# Patient Record
Sex: Female | Born: 1935 | Race: White | Hispanic: No | Smoking: Former smoker
Health system: Southern US, Community
[De-identification: ages and names within clinical notes are randomized; demographics above are authoritative.]

## PROBLEM LIST (undated history)

## (undated) DIAGNOSIS — G709 Myoneural disorder, unspecified: Secondary | ICD-10-CM

## (undated) DIAGNOSIS — I5031 Acute diastolic (congestive) heart failure: Secondary | ICD-10-CM

## (undated) DIAGNOSIS — J302 Other seasonal allergic rhinitis: Secondary | ICD-10-CM

## (undated) DIAGNOSIS — M1990 Unspecified osteoarthritis, unspecified site: Secondary | ICD-10-CM

## (undated) DIAGNOSIS — M4800 Spinal stenosis, site unspecified: Secondary | ICD-10-CM

## (undated) DIAGNOSIS — I509 Heart failure, unspecified: Secondary | ICD-10-CM

## (undated) DIAGNOSIS — Z8739 Personal history of other diseases of the musculoskeletal system and connective tissue: Secondary | ICD-10-CM

## (undated) DIAGNOSIS — E7800 Pure hypercholesterolemia, unspecified: Secondary | ICD-10-CM

## (undated) HISTORY — DX: Pure hypercholesterolemia, unspecified: E78.00

## (undated) HISTORY — PX: CARPAL TUNNEL RELEASE: SHX101

## (undated) HISTORY — PX: TUBAL LIGATION: SHX77

## (undated) HISTORY — PX: APPENDECTOMY: SHX54

## (undated) HISTORY — DX: Heart failure, unspecified: I50.9

## (undated) HISTORY — DX: Personal history of other diseases of the musculoskeletal system and connective tissue: Z87.39

## (undated) HISTORY — DX: Acute diastolic (congestive) heart failure: I50.31

## (undated) HISTORY — DX: Unspecified osteoarthritis, unspecified site: M19.90

## (undated) HISTORY — DX: Spinal stenosis, site unspecified: M48.00

## (undated) HISTORY — PX: TONSILLECTOMY: SUR1361

## (undated) HISTORY — PX: GANGLION CYST EXCISION: SHX1691

---

## 1974-06-03 HISTORY — PX: OTHER SURGICAL HISTORY: SHX169

## 1998-06-03 HISTORY — PX: OTHER SURGICAL HISTORY: SHX169

## 2005-01-30 ENCOUNTER — Ambulatory Visit: Payer: Self-pay | Admitting: Internal Medicine

## 2005-02-05 ENCOUNTER — Ambulatory Visit: Payer: Self-pay | Admitting: Internal Medicine

## 2005-08-16 ENCOUNTER — Ambulatory Visit: Payer: Self-pay | Admitting: Internal Medicine

## 2006-02-06 ENCOUNTER — Ambulatory Visit: Payer: Self-pay | Admitting: Internal Medicine

## 2007-03-03 ENCOUNTER — Ambulatory Visit: Payer: Self-pay | Admitting: Internal Medicine

## 2008-03-10 ENCOUNTER — Ambulatory Visit: Payer: Self-pay | Admitting: Internal Medicine

## 2009-03-17 ENCOUNTER — Ambulatory Visit: Payer: Self-pay | Admitting: Internal Medicine

## 2010-03-19 ENCOUNTER — Ambulatory Visit: Payer: Self-pay | Admitting: Internal Medicine

## 2011-03-25 ENCOUNTER — Ambulatory Visit: Payer: Self-pay | Admitting: Internal Medicine

## 2011-03-28 LAB — LIPID PANEL
Cholesterol: 214 mg/dL — AB (ref 0–200)
Triglycerides: 113 mg/dL (ref 40–160)

## 2011-03-28 LAB — CBC AND DIFFERENTIAL
HCT: 43 % (ref 36–46)
Hemoglobin: 14.2 g/dL (ref 12.0–16.0)

## 2011-03-28 LAB — HEPATIC FUNCTION PANEL: AST: 18 U/L (ref 13–35)

## 2011-04-08 ENCOUNTER — Ambulatory Visit: Payer: Self-pay | Admitting: Physical Medicine and Rehabilitation

## 2011-06-06 DIAGNOSIS — M47817 Spondylosis without myelopathy or radiculopathy, lumbosacral region: Secondary | ICD-10-CM | POA: Diagnosis not present

## 2011-06-06 DIAGNOSIS — IMO0002 Reserved for concepts with insufficient information to code with codable children: Secondary | ICD-10-CM | POA: Diagnosis not present

## 2011-06-06 DIAGNOSIS — M48062 Spinal stenosis, lumbar region with neurogenic claudication: Secondary | ICD-10-CM | POA: Diagnosis not present

## 2011-10-01 DIAGNOSIS — E7800 Pure hypercholesterolemia, unspecified: Secondary | ICD-10-CM | POA: Diagnosis not present

## 2011-10-01 DIAGNOSIS — M549 Dorsalgia, unspecified: Secondary | ICD-10-CM | POA: Diagnosis not present

## 2011-11-06 DIAGNOSIS — L905 Scar conditions and fibrosis of skin: Secondary | ICD-10-CM | POA: Diagnosis not present

## 2011-11-06 DIAGNOSIS — B079 Viral wart, unspecified: Secondary | ICD-10-CM | POA: Diagnosis not present

## 2011-11-06 DIAGNOSIS — L821 Other seborrheic keratosis: Secondary | ICD-10-CM | POA: Diagnosis not present

## 2011-11-06 DIAGNOSIS — Z0189 Encounter for other specified special examinations: Secondary | ICD-10-CM | POA: Diagnosis not present

## 2012-01-01 DIAGNOSIS — M79609 Pain in unspecified limb: Secondary | ICD-10-CM | POA: Diagnosis not present

## 2012-01-01 DIAGNOSIS — IMO0002 Reserved for concepts with insufficient information to code with codable children: Secondary | ICD-10-CM | POA: Diagnosis not present

## 2012-01-14 DIAGNOSIS — IMO0002 Reserved for concepts with insufficient information to code with codable children: Secondary | ICD-10-CM | POA: Diagnosis not present

## 2012-01-29 DIAGNOSIS — IMO0002 Reserved for concepts with insufficient information to code with codable children: Secondary | ICD-10-CM | POA: Diagnosis not present

## 2012-02-19 DIAGNOSIS — IMO0002 Reserved for concepts with insufficient information to code with codable children: Secondary | ICD-10-CM | POA: Diagnosis not present

## 2012-03-04 DIAGNOSIS — Z23 Encounter for immunization: Secondary | ICD-10-CM | POA: Diagnosis not present

## 2012-03-25 ENCOUNTER — Ambulatory Visit: Payer: Self-pay | Admitting: Internal Medicine

## 2012-03-25 DIAGNOSIS — Z1231 Encounter for screening mammogram for malignant neoplasm of breast: Secondary | ICD-10-CM | POA: Diagnosis not present

## 2012-03-25 DIAGNOSIS — R928 Other abnormal and inconclusive findings on diagnostic imaging of breast: Secondary | ICD-10-CM | POA: Diagnosis not present

## 2012-03-27 DIAGNOSIS — H2510 Age-related nuclear cataract, unspecified eye: Secondary | ICD-10-CM | POA: Diagnosis not present

## 2012-03-30 DIAGNOSIS — M129 Arthropathy, unspecified: Secondary | ICD-10-CM | POA: Diagnosis not present

## 2012-04-01 DIAGNOSIS — IMO0002 Reserved for concepts with insufficient information to code with codable children: Secondary | ICD-10-CM | POA: Diagnosis not present

## 2012-06-25 ENCOUNTER — Encounter: Payer: Self-pay | Admitting: *Deleted

## 2012-06-26 ENCOUNTER — Other Ambulatory Visit (HOSPITAL_COMMUNITY)
Admission: RE | Admit: 2012-06-26 | Discharge: 2012-06-26 | Disposition: A | Discharge status: Home / Self Care | Payer: Medicare Other | Source: Ambulatory Visit | Attending: Internal Medicine | Admitting: Internal Medicine

## 2012-06-26 ENCOUNTER — Encounter: Payer: Self-pay | Admitting: Internal Medicine

## 2012-06-26 ENCOUNTER — Ambulatory Visit (INDEPENDENT_AMBULATORY_CARE_PROVIDER_SITE_OTHER): Payer: Medicare Other | Admitting: Internal Medicine

## 2012-06-26 VITALS — BP 144/88 | HR 82 | Temp 98.3°F | Ht 66.5 in | Wt 165.8 lb

## 2012-06-26 DIAGNOSIS — R5383 Other fatigue: Secondary | ICD-10-CM | POA: Diagnosis not present

## 2012-06-26 DIAGNOSIS — R5381 Other malaise: Secondary | ICD-10-CM | POA: Diagnosis not present

## 2012-06-26 DIAGNOSIS — Z139 Encounter for screening, unspecified: Secondary | ICD-10-CM

## 2012-06-26 DIAGNOSIS — Z124 Encounter for screening for malignant neoplasm of cervix: Secondary | ICD-10-CM | POA: Diagnosis not present

## 2012-06-26 DIAGNOSIS — E7800 Pure hypercholesterolemia, unspecified: Secondary | ICD-10-CM | POA: Diagnosis not present

## 2012-06-26 DIAGNOSIS — M4800 Spinal stenosis, site unspecified: Secondary | ICD-10-CM

## 2012-06-27 ENCOUNTER — Encounter: Payer: Self-pay | Admitting: Internal Medicine

## 2012-06-27 ENCOUNTER — Telehealth: Payer: Self-pay | Admitting: Internal Medicine

## 2012-06-27 DIAGNOSIS — M48061 Spinal stenosis, lumbar region without neurogenic claudication: Secondary | ICD-10-CM

## 2012-06-27 DIAGNOSIS — M4800 Spinal stenosis, site unspecified: Secondary | ICD-10-CM | POA: Insufficient documentation

## 2012-06-27 DIAGNOSIS — R195 Other fecal abnormalities: Secondary | ICD-10-CM

## 2012-06-27 DIAGNOSIS — E7800 Pure hypercholesterolemia, unspecified: Secondary | ICD-10-CM | POA: Insufficient documentation

## 2012-06-27 HISTORY — DX: Spinal stenosis, lumbar region without neurogenic claudication: M48.061

## 2012-06-27 NOTE — Progress Notes (Signed)
  Subjective:    Patient ID: Deborah Baxter, female    DOB: Apr 23, 1934, 79 y.o.   MRN: [id number]  HPI 79 year old female with past history of spinal stenosis with chronic back and leg pain and hypercholesterolemia who comes in today to follow up on these issues as well as for a complete physical exam.  States she is doing well.  No chest pain or tightness.  Breathing stable.  Eating and drinking well.  Bowels stable.  No urinary change.  Pain stable.  Still exercising.    Past Medical History  Diagnosis Date  . Hypercholesterolemia   . Spinal stenosis     chronic back and leg pain  . Hx of degenerative disc disease   . Osteoarthritis     Current Outpatient Prescriptions on File Prior to Visit  Medication Sig Dispense Refill  . acetaminophen (TYLENOL) 650 MG CR tablet Take 650 mg by mouth daily. 2 tabs daily      . Calcium Carbonate-Vitamin D (CALCIUM 500 + D PO) Take by mouth 2 (two) times daily. 1 tab by mouth bid        Review of Systems Patient denies any headache, lightheadedness or dizziness.  No sinus or allergy symptoms.  No chest pain, tightness or palpitations.  No increased shortness of breath, cough or congestion.  No nausea or vomiting.  No acid reflux.  No abdominal pain or cramping.  No bowel change.  States bowels are stable.  No BRBPR or melana.  No urine change.        Objective:   Physical Exam Filed Vitals:   06/26/12 1613  BP: 144/88  Pulse: 82  Temp: 98.3 F (82.65 C)   79 year old female in no acute distress.   HEENT:  Nares- clear.  Oropharynx - without lesions. NECK:  Supple.  Nontender.  No audible bruit.  HEART:  Appears to be regular. LUNGS:  No crackles or wheezing audible.  Respirations even and unlabored.  RADIAL PULSE:  Equal bilaterally.    BREASTS:  No nipple discharge or nipple retraction present.  Could not appreciate any distinct nodules or axillary adenopathy.  ABDOMEN:  Soft, nontender.  Bowel sounds present and normal.  No audible abdominal  bruit.  GU:  Normal external genitalia.  Vaginal vault without lesions.  Cervix identified.  Pap performed. Could not appreciate any adnexal masses or tenderness.   RECTAL:  Heme positive.   EXTREMITIES:  No increased edema present.  DP pulses palpable and equal bilaterally.           Assessment & Plan:  ELEVATED BLOOD PRESSURE.  Has been elevated slightly on checks in the office.  Checks ok to day.  Follow.    CARDIOVASCULAR.  Asymptomatic.  Follow.    GI.  Heme positive on exam today.  Check cbc.  IFOB.  Refer to GI for evaluation.  Colonoscopy 07/01/03 - normal.   HEALTH MAINTENANCE.  Physical today.  Requested pap.  Had her mammogram for 2013.  Obtain results.  Colonoscopy 07/01/03 - normal.  Heme positive today.  Refer back to GI for evaluation  IFOB today.  Normal bone density. 04/06/10.

## 2012-06-27 NOTE — Assessment & Plan Note (Signed)
Low cholesterol diet and exercise.  Check lipid panel.   

## 2012-06-27 NOTE — Telephone Encounter (Signed)
Needs a follow up appt in 6 months.  Also needs labs schedule in 1-2 weeks. (fasting labs).  Please notify pt of appts.  Thanks.

## 2012-06-27 NOTE — Assessment & Plan Note (Signed)
Chronic back and leg pain.  Stable.  Follow.

## 2012-07-01 ENCOUNTER — Other Ambulatory Visit: Payer: PRIVATE HEALTH INSURANCE

## 2012-07-01 NOTE — Telephone Encounter (Signed)
I have placed order for gastro referral.  Amber should be calling her with an appt.  Let us know if any problems.

## 2012-07-01 NOTE — Telephone Encounter (Signed)
6 month follow up 12/30/12 and labs appointment 07/08/12  Pt aware of appointment Pt also mention that you wanted her to have a colonscopy.  There is no order in for that

## 2012-07-01 NOTE — Telephone Encounter (Signed)
Patient notified

## 2012-07-07 ENCOUNTER — Other Ambulatory Visit (INDEPENDENT_AMBULATORY_CARE_PROVIDER_SITE_OTHER): Payer: Medicare Other

## 2012-07-07 DIAGNOSIS — Z139 Encounter for screening, unspecified: Secondary | ICD-10-CM

## 2012-07-07 LAB — FECAL OCCULT BLOOD, IMMUNOCHEMICAL: Fecal Occult Bld: NEGATIVE

## 2012-07-08 ENCOUNTER — Other Ambulatory Visit (INDEPENDENT_AMBULATORY_CARE_PROVIDER_SITE_OTHER): Payer: Medicare Other

## 2012-07-08 DIAGNOSIS — R5383 Other fatigue: Secondary | ICD-10-CM

## 2012-07-08 DIAGNOSIS — R5381 Other malaise: Secondary | ICD-10-CM | POA: Diagnosis not present

## 2012-07-08 DIAGNOSIS — E7800 Pure hypercholesterolemia, unspecified: Secondary | ICD-10-CM

## 2012-07-08 LAB — CBC WITH DIFFERENTIAL/PLATELET
Basophils Absolute: 0.1 10*3/uL (ref 0.0–0.1)
Eosinophils Absolute: 0.2 10*3/uL (ref 0.0–0.7)
Lymphocytes Relative: 34.9 % (ref 12.0–46.0)
MCHC: 33.5 g/dL (ref 30.0–36.0)
Monocytes Relative: 7.4 % (ref 3.0–12.0)
Neutrophils Relative %: 52.9 % (ref 43.0–77.0)
Platelets: 186 10*3/uL (ref 150.0–400.0)
RDW: 13.5 % (ref 11.5–14.6)

## 2012-07-08 LAB — LDL CHOLESTEROL, DIRECT: Direct LDL: 94.2 mg/dL

## 2012-07-08 LAB — COMPREHENSIVE METABOLIC PANEL
ALT: 22 U/L (ref 0–35)
AST: 20 U/L (ref 0–37)
Calcium: 9.4 mg/dL (ref 8.4–10.5)
Chloride: 106 mEq/L (ref 96–112)
Creatinine, Ser: 0.7 mg/dL (ref 0.4–1.2)
Potassium: 5.1 mEq/L (ref 3.5–5.1)
Sodium: 140 mEq/L (ref 135–145)
Total Protein: 7 g/dL (ref 6.0–8.3)

## 2012-07-08 LAB — LIPID PANEL
Total CHOL/HDL Ratio: 2
Triglycerides: 76 mg/dL (ref 0.0–149.0)

## 2012-07-09 ENCOUNTER — Other Ambulatory Visit: Payer: Self-pay | Admitting: Internal Medicine

## 2012-07-09 ENCOUNTER — Telehealth: Payer: Self-pay | Admitting: Internal Medicine

## 2012-07-09 DIAGNOSIS — E875 Hyperkalemia: Secondary | ICD-10-CM

## 2012-07-09 NOTE — Progress Notes (Signed)
Order placed for follow up labs 

## 2012-07-09 NOTE — Telephone Encounter (Signed)
Pt notified of her labs and need for follow up lab.  She is coming in 07/28/12 at 9:15 for lab.  Please place on lab schedule.

## 2012-07-13 NOTE — Telephone Encounter (Signed)
Appointment made

## 2012-07-22 ENCOUNTER — Telehealth: Payer: Self-pay | Admitting: Internal Medicine

## 2012-07-22 NOTE — Telephone Encounter (Signed)
Patient returning your call.  Patient had a mammogram on 10.23.13 at [facility].

## 2012-07-23 NOTE — Telephone Encounter (Signed)
Noted.  Hold until receive.   

## 2012-07-23 NOTE — Telephone Encounter (Signed)
Requested mammography results from [facility]

## 2012-07-28 ENCOUNTER — Other Ambulatory Visit (INDEPENDENT_AMBULATORY_CARE_PROVIDER_SITE_OTHER): Payer: Medicare Other

## 2012-07-28 DIAGNOSIS — E875 Hyperkalemia: Secondary | ICD-10-CM

## 2012-07-28 LAB — POTASSIUM: Potassium: 5 mEq/L (ref 3.5–5.1)

## 2012-08-24 DIAGNOSIS — K5900 Constipation, unspecified: Secondary | ICD-10-CM | POA: Diagnosis not present

## 2012-08-31 ENCOUNTER — Ambulatory Visit: Payer: Self-pay | Admitting: Gastroenterology

## 2012-08-31 DIAGNOSIS — K5730 Diverticulosis of large intestine without perforation or abscess without bleeding: Secondary | ICD-10-CM | POA: Diagnosis not present

## 2012-08-31 DIAGNOSIS — K921 Melena: Secondary | ICD-10-CM | POA: Diagnosis not present

## 2012-08-31 DIAGNOSIS — Z803 Family history of malignant neoplasm of breast: Secondary | ICD-10-CM | POA: Diagnosis not present

## 2012-08-31 DIAGNOSIS — R195 Other fecal abnormalities: Secondary | ICD-10-CM | POA: Diagnosis not present

## 2012-08-31 DIAGNOSIS — M1990 Unspecified osteoarthritis, unspecified site: Secondary | ICD-10-CM | POA: Diagnosis not present

## 2012-08-31 DIAGNOSIS — E7800 Pure hypercholesterolemia, unspecified: Secondary | ICD-10-CM | POA: Diagnosis not present

## 2012-08-31 DIAGNOSIS — K648 Other hemorrhoids: Secondary | ICD-10-CM | POA: Diagnosis not present

## 2012-08-31 DIAGNOSIS — K5900 Constipation, unspecified: Secondary | ICD-10-CM | POA: Diagnosis not present

## 2012-08-31 DIAGNOSIS — K3189 Other diseases of stomach and duodenum: Secondary | ICD-10-CM | POA: Diagnosis not present

## 2012-08-31 DIAGNOSIS — Q438 Other specified congenital malformations of intestine: Secondary | ICD-10-CM | POA: Diagnosis not present

## 2012-09-05 ENCOUNTER — Encounter: Payer: Self-pay | Admitting: Internal Medicine

## 2012-09-05 DIAGNOSIS — K5790 Diverticulosis of intestine, part unspecified, without perforation or abscess without bleeding: Secondary | ICD-10-CM

## 2012-09-05 HISTORY — DX: Diverticulosis of intestine, part unspecified, without perforation or abscess without bleeding: K57.90

## 2012-09-17 ENCOUNTER — Encounter: Payer: Self-pay | Admitting: Internal Medicine

## 2012-12-30 ENCOUNTER — Encounter: Payer: Self-pay | Admitting: Internal Medicine

## 2012-12-30 ENCOUNTER — Ambulatory Visit (INDEPENDENT_AMBULATORY_CARE_PROVIDER_SITE_OTHER): Payer: Medicare Other | Admitting: Internal Medicine

## 2012-12-30 VITALS — BP 122/80 | HR 72 | Temp 97.8°F | Ht 66.5 in | Wt 161.8 lb

## 2012-12-30 DIAGNOSIS — K5790 Diverticulosis of intestine, part unspecified, without perforation or abscess without bleeding: Secondary | ICD-10-CM

## 2012-12-30 DIAGNOSIS — M4800 Spinal stenosis, site unspecified: Secondary | ICD-10-CM

## 2012-12-30 DIAGNOSIS — M79609 Pain in unspecified limb: Secondary | ICD-10-CM

## 2012-12-30 DIAGNOSIS — M79604 Pain in right leg: Secondary | ICD-10-CM

## 2012-12-30 DIAGNOSIS — M549 Dorsalgia, unspecified: Secondary | ICD-10-CM | POA: Diagnosis not present

## 2012-12-30 DIAGNOSIS — E7800 Pure hypercholesterolemia, unspecified: Secondary | ICD-10-CM

## 2012-12-30 DIAGNOSIS — K5730 Diverticulosis of large intestine without perforation or abscess without bleeding: Secondary | ICD-10-CM

## 2012-12-30 LAB — LDL CHOLESTEROL, DIRECT: Direct LDL: 124.6 mg/dL

## 2012-12-30 LAB — BASIC METABOLIC PANEL
BUN: 16 mg/dL (ref 6–23)
Creatinine, Ser: 0.7 mg/dL (ref 0.4–1.2)
GFR: 89.19 mL/min (ref >60.00)
Glucose, Bld: 92 mg/dL (ref 70–99)

## 2012-12-30 LAB — LIPID PANEL
Cholesterol: 209 mg/dL — ABNORMAL HIGH (ref 0–200)
HDL: 68.6 mg/dL (ref >39.00)
VLDL: 15.2 mg/dL (ref 0.0–40.0)

## 2012-12-31 ENCOUNTER — Other Ambulatory Visit: Payer: Self-pay | Admitting: Internal Medicine

## 2012-12-31 DIAGNOSIS — E875 Hyperkalemia: Secondary | ICD-10-CM

## 2012-12-31 NOTE — Progress Notes (Signed)
Order placed for f/u potassium.  

## 2013-01-01 DIAGNOSIS — M25569 Pain in unspecified knee: Secondary | ICD-10-CM | POA: Diagnosis not present

## 2013-01-01 DIAGNOSIS — M48061 Spinal stenosis, lumbar region without neurogenic claudication: Secondary | ICD-10-CM | POA: Diagnosis not present

## 2013-01-02 ENCOUNTER — Encounter: Payer: Self-pay | Admitting: Internal Medicine

## 2013-01-02 NOTE — Progress Notes (Signed)
Subjective:    Patient ID: Deborah Baxter, female    DOB: [birth year], 79 y.o.   MRN: [id number]  HPI 79 year old female with past history of spinal stenosis with chronic back and leg pain and hypercholesterolemia who comes in today for a scheduled follow up.  States she had been doing relatively well.  No chest pain or tightness.  Breathing stable.  Eating and drinking well.  Bowels stable. No urinary change.  Went to [employer] last week.  Was carrying a lot of heavy stuff up stairs.  Noticed some increased right hip/leg and knee pain.  Increased pain - unable to walk.  Has been taking some ibuprofen.  Pain is better now.  Able to walk better now.  Has spinal stenosis.  Pain was localized more to her hip and knee.     Past Medical History  Diagnosis Date  . Hypercholesterolemia   . Spinal stenosis     chronic back and leg pain  . Hx of degenerative disc disease   . Osteoarthritis     Current Outpatient Prescriptions on File Prior to Visit  Medication Sig Dispense Refill  . acetaminophen (TYLENOL) 650 MG CR tablet Take 650 mg by mouth daily. 2 tabs daily      . Calcium Carbonate-Vitamin D (CALCIUM 500 + D PO) Take by mouth 2 (two) times daily. 1 tab by mouth bid      . Multiple Vitamin (MULTIVITAMIN) tablet Take 1 tablet by mouth daily.       No current facility-administered medications on file prior to visit.    Review of Systems Patient denies any headache, lightheadedness or dizziness.  No sinus or allergy symptoms.  No chest pain, tightness or palpitations.  No increased shortness of breath, cough or congestion.  No nausea or vomiting.  No acid reflux.  No abdominal pain or cramping.  No bowel change.  States bowels are stable.  No BRBPR or melana.  Bowels doing better with q 3 days - miralax.  No urine change.  Right hip pain and right knee pain as outlined.  Previously had problems walking.  Walking better now.       Objective:   Physical Exam  Filed Vitals:   12/30/12 0902  BP:  122/80  Pulse: 72  Temp: 97.8 F (29.12 C)   79 year old female in no acute distress.   HEENT:  Nares- clear.  Oropharynx - without lesions. NECK:  Supple.  Nontender.  No audible bruit.  HEART:  Appears to be regular. LUNGS:  No crackles or wheezing audible.  Respirations even and unlabored.  RADIAL PULSE:  Equal bilaterally. ABDOMEN:  Soft, nontender.  Bowel sounds present and normal.  No audible abdominal bruit.    EXTREMITIES:  No increased edema present.  DP pulses palpable and equal bilaterally.  No significant pain with adduction/abduction right leg.  No pain with rotation at her knee.  Some pulling sensation in her right lateral hip/thigh - with straight leg raise (right leg).          Assessment & Plan:  MSK.  Has known spinal stenosis.  With increased right hip and knee pain as outlined.  Is doing better now.  Taking ibuprofen.  Has cut down on ibuprofen now.  Still having some localized knee pain to palpation and some pulling sensation in her right hip.  Discussed with her regarding further treatment.  Will use tylenol as directed.  Will refer to Dr Gavin Potters for evaluation and possible  injection (of her knee or hip).  May improve her symptoms and help her overall walking.  We did discuss the possibility of these symptoms originating from her back as well.  She declines further w/up at this time.    CARDIOVASCULAR.  Asymptomatic.  Follow.    GI.  Heme positive on previous visit.  Referred to GI last visit.  Colonoscopy 07/01/03 - normal.  Had a follow up colonoscopy 08/31/12.    HEALTH MAINTENANCE.  Physical 06/26/12.  Mammogram 03/25/12 - Birads II.  Colonoscopy 07/01/03 - normal.  Heme positive last visit.  Last colonoscopy 08/31/12.   Normal bone density. 04/06/10.

## 2013-01-02 NOTE — Assessment & Plan Note (Signed)
Low cholesterol diet and exercise.  Follow lipid panel.   

## 2013-01-02 NOTE — Assessment & Plan Note (Signed)
Colonoscopy as outlined.  Currently doing well.   

## 2013-01-02 NOTE — Assessment & Plan Note (Signed)
Chronic back and leg pain.  Had been stable.  Now with increased pain as outlined.  Better.  Tylenol as directed.  Refer to Dr Gavin Potters as outlined.

## 2013-01-04 ENCOUNTER — Telehealth: Payer: Self-pay | Admitting: Internal Medicine

## 2013-01-04 ENCOUNTER — Other Ambulatory Visit (INDEPENDENT_AMBULATORY_CARE_PROVIDER_SITE_OTHER): Payer: Medicare Other

## 2013-01-04 DIAGNOSIS — E875 Hyperkalemia: Secondary | ICD-10-CM | POA: Diagnosis not present

## 2013-01-04 NOTE — Telephone Encounter (Signed)
Pt.notified

## 2013-01-04 NOTE — Telephone Encounter (Signed)
Pt came in today for labs and wanted to let you know that she is still have hip pain.  The pain has been constant and it started getting more painful on Saturday.

## 2013-01-04 NOTE — Telephone Encounter (Signed)
I am not sure when this pts appt is with Dr Gavin Potters.  She is apparently having worsening hip pain.  Can we see if she can be seen asap.  If he is on vacation, etc - can see if ortho can see her asap.  Thanks.  Let me know what you need me to do.

## 2013-01-04 NOTE — Telephone Encounter (Signed)
Patient was seen by [facility] on 8/1. They are faxing notes to see what the care plan was.

## 2013-01-04 NOTE — Telephone Encounter (Signed)
Do not have records yet, but since she saw Dr Gavin Potters Friday - I would recommend her calling him and letting him know is having increased pain.

## 2013-01-08 DIAGNOSIS — M5137 Other intervertebral disc degeneration, lumbosacral region: Secondary | ICD-10-CM | POA: Diagnosis not present

## 2013-01-08 DIAGNOSIS — M955 Acquired deformity of pelvis: Secondary | ICD-10-CM | POA: Diagnosis not present

## 2013-01-08 DIAGNOSIS — M999 Biomechanical lesion, unspecified: Secondary | ICD-10-CM | POA: Diagnosis not present

## 2013-01-09 DIAGNOSIS — M999 Biomechanical lesion, unspecified: Secondary | ICD-10-CM | POA: Diagnosis not present

## 2013-01-09 DIAGNOSIS — M955 Acquired deformity of pelvis: Secondary | ICD-10-CM | POA: Diagnosis not present

## 2013-01-09 DIAGNOSIS — M5137 Other intervertebral disc degeneration, lumbosacral region: Secondary | ICD-10-CM | POA: Diagnosis not present

## 2013-01-11 DIAGNOSIS — M5137 Other intervertebral disc degeneration, lumbosacral region: Secondary | ICD-10-CM | POA: Diagnosis not present

## 2013-01-11 DIAGNOSIS — M999 Biomechanical lesion, unspecified: Secondary | ICD-10-CM | POA: Diagnosis not present

## 2013-01-11 DIAGNOSIS — M955 Acquired deformity of pelvis: Secondary | ICD-10-CM | POA: Diagnosis not present

## 2013-01-13 DIAGNOSIS — M999 Biomechanical lesion, unspecified: Secondary | ICD-10-CM | POA: Diagnosis not present

## 2013-01-13 DIAGNOSIS — M5137 Other intervertebral disc degeneration, lumbosacral region: Secondary | ICD-10-CM | POA: Diagnosis not present

## 2013-01-13 DIAGNOSIS — M955 Acquired deformity of pelvis: Secondary | ICD-10-CM | POA: Diagnosis not present

## 2013-01-14 DIAGNOSIS — M999 Biomechanical lesion, unspecified: Secondary | ICD-10-CM | POA: Diagnosis not present

## 2013-01-14 DIAGNOSIS — M5137 Other intervertebral disc degeneration, lumbosacral region: Secondary | ICD-10-CM | POA: Diagnosis not present

## 2013-01-14 DIAGNOSIS — M955 Acquired deformity of pelvis: Secondary | ICD-10-CM | POA: Diagnosis not present

## 2013-01-18 DIAGNOSIS — M999 Biomechanical lesion, unspecified: Secondary | ICD-10-CM | POA: Diagnosis not present

## 2013-01-18 DIAGNOSIS — M955 Acquired deformity of pelvis: Secondary | ICD-10-CM | POA: Diagnosis not present

## 2013-01-18 DIAGNOSIS — M5137 Other intervertebral disc degeneration, lumbosacral region: Secondary | ICD-10-CM | POA: Diagnosis not present

## 2013-01-20 DIAGNOSIS — M955 Acquired deformity of pelvis: Secondary | ICD-10-CM | POA: Diagnosis not present

## 2013-01-20 DIAGNOSIS — M999 Biomechanical lesion, unspecified: Secondary | ICD-10-CM | POA: Diagnosis not present

## 2013-01-20 DIAGNOSIS — M5137 Other intervertebral disc degeneration, lumbosacral region: Secondary | ICD-10-CM | POA: Diagnosis not present

## 2013-01-21 DIAGNOSIS — M5137 Other intervertebral disc degeneration, lumbosacral region: Secondary | ICD-10-CM | POA: Diagnosis not present

## 2013-01-21 DIAGNOSIS — M999 Biomechanical lesion, unspecified: Secondary | ICD-10-CM | POA: Diagnosis not present

## 2013-01-21 DIAGNOSIS — M955 Acquired deformity of pelvis: Secondary | ICD-10-CM | POA: Diagnosis not present

## 2013-01-25 DIAGNOSIS — M955 Acquired deformity of pelvis: Secondary | ICD-10-CM | POA: Diagnosis not present

## 2013-01-25 DIAGNOSIS — M999 Biomechanical lesion, unspecified: Secondary | ICD-10-CM | POA: Diagnosis not present

## 2013-01-25 DIAGNOSIS — M5137 Other intervertebral disc degeneration, lumbosacral region: Secondary | ICD-10-CM | POA: Diagnosis not present

## 2013-01-27 DIAGNOSIS — M999 Biomechanical lesion, unspecified: Secondary | ICD-10-CM | POA: Diagnosis not present

## 2013-01-27 DIAGNOSIS — M955 Acquired deformity of pelvis: Secondary | ICD-10-CM | POA: Diagnosis not present

## 2013-01-27 DIAGNOSIS — M5137 Other intervertebral disc degeneration, lumbosacral region: Secondary | ICD-10-CM | POA: Diagnosis not present

## 2013-01-28 DIAGNOSIS — M5137 Other intervertebral disc degeneration, lumbosacral region: Secondary | ICD-10-CM | POA: Diagnosis not present

## 2013-01-28 DIAGNOSIS — M955 Acquired deformity of pelvis: Secondary | ICD-10-CM | POA: Diagnosis not present

## 2013-01-28 DIAGNOSIS — M999 Biomechanical lesion, unspecified: Secondary | ICD-10-CM | POA: Diagnosis not present

## 2013-02-02 DIAGNOSIS — M955 Acquired deformity of pelvis: Secondary | ICD-10-CM | POA: Diagnosis not present

## 2013-02-02 DIAGNOSIS — M999 Biomechanical lesion, unspecified: Secondary | ICD-10-CM | POA: Diagnosis not present

## 2013-02-02 DIAGNOSIS — M5137 Other intervertebral disc degeneration, lumbosacral region: Secondary | ICD-10-CM | POA: Diagnosis not present

## 2013-02-03 DIAGNOSIS — M999 Biomechanical lesion, unspecified: Secondary | ICD-10-CM | POA: Diagnosis not present

## 2013-02-03 DIAGNOSIS — M5137 Other intervertebral disc degeneration, lumbosacral region: Secondary | ICD-10-CM | POA: Diagnosis not present

## 2013-02-03 DIAGNOSIS — M955 Acquired deformity of pelvis: Secondary | ICD-10-CM | POA: Diagnosis not present

## 2013-02-04 DIAGNOSIS — M5137 Other intervertebral disc degeneration, lumbosacral region: Secondary | ICD-10-CM | POA: Diagnosis not present

## 2013-02-04 DIAGNOSIS — M955 Acquired deformity of pelvis: Secondary | ICD-10-CM | POA: Diagnosis not present

## 2013-02-04 DIAGNOSIS — M999 Biomechanical lesion, unspecified: Secondary | ICD-10-CM | POA: Diagnosis not present

## 2013-02-09 DIAGNOSIS — M999 Biomechanical lesion, unspecified: Secondary | ICD-10-CM | POA: Diagnosis not present

## 2013-02-09 DIAGNOSIS — M955 Acquired deformity of pelvis: Secondary | ICD-10-CM | POA: Diagnosis not present

## 2013-02-09 DIAGNOSIS — M5137 Other intervertebral disc degeneration, lumbosacral region: Secondary | ICD-10-CM | POA: Diagnosis not present

## 2013-02-11 DIAGNOSIS — M955 Acquired deformity of pelvis: Secondary | ICD-10-CM | POA: Diagnosis not present

## 2013-02-11 DIAGNOSIS — M999 Biomechanical lesion, unspecified: Secondary | ICD-10-CM | POA: Diagnosis not present

## 2013-02-11 DIAGNOSIS — M5137 Other intervertebral disc degeneration, lumbosacral region: Secondary | ICD-10-CM | POA: Diagnosis not present

## 2013-02-16 DIAGNOSIS — M999 Biomechanical lesion, unspecified: Secondary | ICD-10-CM | POA: Diagnosis not present

## 2013-02-16 DIAGNOSIS — M955 Acquired deformity of pelvis: Secondary | ICD-10-CM | POA: Diagnosis not present

## 2013-02-16 DIAGNOSIS — M5137 Other intervertebral disc degeneration, lumbosacral region: Secondary | ICD-10-CM | POA: Diagnosis not present

## 2013-02-18 DIAGNOSIS — M999 Biomechanical lesion, unspecified: Secondary | ICD-10-CM | POA: Diagnosis not present

## 2013-02-18 DIAGNOSIS — M955 Acquired deformity of pelvis: Secondary | ICD-10-CM | POA: Diagnosis not present

## 2013-02-18 DIAGNOSIS — M5137 Other intervertebral disc degeneration, lumbosacral region: Secondary | ICD-10-CM | POA: Diagnosis not present

## 2013-02-23 DIAGNOSIS — M955 Acquired deformity of pelvis: Secondary | ICD-10-CM | POA: Diagnosis not present

## 2013-02-23 DIAGNOSIS — M999 Biomechanical lesion, unspecified: Secondary | ICD-10-CM | POA: Diagnosis not present

## 2013-02-23 DIAGNOSIS — M5137 Other intervertebral disc degeneration, lumbosacral region: Secondary | ICD-10-CM | POA: Diagnosis not present

## 2013-02-25 DIAGNOSIS — M999 Biomechanical lesion, unspecified: Secondary | ICD-10-CM | POA: Diagnosis not present

## 2013-02-25 DIAGNOSIS — M5137 Other intervertebral disc degeneration, lumbosacral region: Secondary | ICD-10-CM | POA: Diagnosis not present

## 2013-02-25 DIAGNOSIS — M955 Acquired deformity of pelvis: Secondary | ICD-10-CM | POA: Diagnosis not present

## 2013-02-26 ENCOUNTER — Telehealth: Payer: Self-pay | Admitting: Internal Medicine

## 2013-02-26 DIAGNOSIS — Z1239 Encounter for other screening for malignant neoplasm of breast: Secondary | ICD-10-CM

## 2013-02-26 NOTE — Telephone Encounter (Signed)
Pt received her letter from [facility] to schedule mammogram.  Advised pt as this a routine mammogram she can schedule herself.  Pt states she would prefer we set the appt up for her.  Pt has not time of day/day of week preference.

## 2013-02-26 NOTE — Telephone Encounter (Signed)
Needs mammo scheduled.  See her phone message.

## 2013-03-01 NOTE — Telephone Encounter (Signed)
Pt scheduled and she is aware.

## 2013-03-03 DIAGNOSIS — M999 Biomechanical lesion, unspecified: Secondary | ICD-10-CM | POA: Diagnosis not present

## 2013-03-03 DIAGNOSIS — M5137 Other intervertebral disc degeneration, lumbosacral region: Secondary | ICD-10-CM | POA: Diagnosis not present

## 2013-03-03 DIAGNOSIS — M955 Acquired deformity of pelvis: Secondary | ICD-10-CM | POA: Diagnosis not present

## 2013-03-10 DIAGNOSIS — M5137 Other intervertebral disc degeneration, lumbosacral region: Secondary | ICD-10-CM | POA: Diagnosis not present

## 2013-03-10 DIAGNOSIS — M955 Acquired deformity of pelvis: Secondary | ICD-10-CM | POA: Diagnosis not present

## 2013-03-10 DIAGNOSIS — M999 Biomechanical lesion, unspecified: Secondary | ICD-10-CM | POA: Diagnosis not present

## 2013-03-17 DIAGNOSIS — M955 Acquired deformity of pelvis: Secondary | ICD-10-CM | POA: Diagnosis not present

## 2013-03-17 DIAGNOSIS — M5137 Other intervertebral disc degeneration, lumbosacral region: Secondary | ICD-10-CM | POA: Diagnosis not present

## 2013-03-17 DIAGNOSIS — M999 Biomechanical lesion, unspecified: Secondary | ICD-10-CM | POA: Diagnosis not present

## 2013-03-24 DIAGNOSIS — M999 Biomechanical lesion, unspecified: Secondary | ICD-10-CM | POA: Diagnosis not present

## 2013-03-24 DIAGNOSIS — M955 Acquired deformity of pelvis: Secondary | ICD-10-CM | POA: Diagnosis not present

## 2013-03-24 DIAGNOSIS — Z23 Encounter for immunization: Secondary | ICD-10-CM | POA: Diagnosis not present

## 2013-03-24 DIAGNOSIS — M5137 Other intervertebral disc degeneration, lumbosacral region: Secondary | ICD-10-CM | POA: Diagnosis not present

## 2013-03-26 ENCOUNTER — Ambulatory Visit: Payer: Self-pay | Admitting: Internal Medicine

## 2013-03-26 DIAGNOSIS — Z1231 Encounter for screening mammogram for malignant neoplasm of breast: Secondary | ICD-10-CM | POA: Diagnosis not present

## 2013-04-08 DIAGNOSIS — M955 Acquired deformity of pelvis: Secondary | ICD-10-CM | POA: Diagnosis not present

## 2013-04-08 DIAGNOSIS — M5137 Other intervertebral disc degeneration, lumbosacral region: Secondary | ICD-10-CM | POA: Diagnosis not present

## 2013-04-08 DIAGNOSIS — M999 Biomechanical lesion, unspecified: Secondary | ICD-10-CM | POA: Diagnosis not present

## 2013-04-09 DIAGNOSIS — K1370 Unspecified lesions of oral mucosa: Secondary | ICD-10-CM | POA: Diagnosis not present

## 2013-04-13 ENCOUNTER — Encounter: Payer: Self-pay | Admitting: Internal Medicine

## 2013-04-28 DIAGNOSIS — M955 Acquired deformity of pelvis: Secondary | ICD-10-CM | POA: Diagnosis not present

## 2013-04-28 DIAGNOSIS — M5137 Other intervertebral disc degeneration, lumbosacral region: Secondary | ICD-10-CM | POA: Diagnosis not present

## 2013-04-28 DIAGNOSIS — M999 Biomechanical lesion, unspecified: Secondary | ICD-10-CM | POA: Diagnosis not present

## 2013-05-26 DIAGNOSIS — M955 Acquired deformity of pelvis: Secondary | ICD-10-CM | POA: Diagnosis not present

## 2013-05-26 DIAGNOSIS — M5137 Other intervertebral disc degeneration, lumbosacral region: Secondary | ICD-10-CM | POA: Diagnosis not present

## 2013-05-26 DIAGNOSIS — M999 Biomechanical lesion, unspecified: Secondary | ICD-10-CM | POA: Diagnosis not present

## 2013-06-17 DIAGNOSIS — M5137 Other intervertebral disc degeneration, lumbosacral region: Secondary | ICD-10-CM | POA: Diagnosis not present

## 2013-06-17 DIAGNOSIS — M955 Acquired deformity of pelvis: Secondary | ICD-10-CM | POA: Diagnosis not present

## 2013-06-17 DIAGNOSIS — M999 Biomechanical lesion, unspecified: Secondary | ICD-10-CM | POA: Diagnosis not present

## 2013-07-08 ENCOUNTER — Ambulatory Visit (INDEPENDENT_AMBULATORY_CARE_PROVIDER_SITE_OTHER): Payer: Medicare Other | Admitting: Internal Medicine

## 2013-07-08 ENCOUNTER — Encounter: Payer: Self-pay | Admitting: Internal Medicine

## 2013-07-08 ENCOUNTER — Encounter (INDEPENDENT_AMBULATORY_CARE_PROVIDER_SITE_OTHER): Payer: Self-pay

## 2013-07-08 VITALS — BP 130/80 | HR 70 | Temp 97.4°F | Ht 67.0 in | Wt 162.2 lb

## 2013-07-08 DIAGNOSIS — M999 Biomechanical lesion, unspecified: Secondary | ICD-10-CM | POA: Diagnosis not present

## 2013-07-08 DIAGNOSIS — Z23 Encounter for immunization: Secondary | ICD-10-CM | POA: Diagnosis not present

## 2013-07-08 DIAGNOSIS — M955 Acquired deformity of pelvis: Secondary | ICD-10-CM | POA: Diagnosis not present

## 2013-07-08 DIAGNOSIS — M5137 Other intervertebral disc degeneration, lumbosacral region: Secondary | ICD-10-CM | POA: Diagnosis not present

## 2013-07-08 DIAGNOSIS — M4800 Spinal stenosis, site unspecified: Secondary | ICD-10-CM

## 2013-07-08 DIAGNOSIS — E7800 Pure hypercholesterolemia, unspecified: Secondary | ICD-10-CM

## 2013-07-08 LAB — LIPID PANEL
CHOL/HDL RATIO: 3
Cholesterol: 219 mg/dL — ABNORMAL HIGH (ref 0–200)
HDL: 77.5 mg/dL (ref >39.00)
Triglycerides: 121 mg/dL (ref 0.0–149.0)
VLDL: 24.2 mg/dL (ref 0.0–40.0)

## 2013-07-08 LAB — COMPREHENSIVE METABOLIC PANEL
ALBUMIN: 3.9 g/dL (ref 3.5–5.2)
ALK PHOS: 50 U/L (ref 39–117)
ALT: 19 U/L (ref 0–35)
AST: 22 U/L (ref 0–37)
BILIRUBIN TOTAL: 0.6 mg/dL (ref 0.3–1.2)
BUN: 13 mg/dL (ref 6–23)
CO2: 31 mEq/L (ref 19–32)
Calcium: 9.8 mg/dL (ref 8.4–10.5)
Chloride: 106 mEq/L (ref 96–112)
Creatinine, Ser: 0.6 mg/dL (ref 0.4–1.2)
GFR: 99.09 mL/min (ref >60.00)
Glucose, Bld: 87 mg/dL (ref 70–99)
POTASSIUM: 5.3 meq/L — AB (ref 3.5–5.1)
SODIUM: 141 meq/L (ref 135–145)
TOTAL PROTEIN: 6.7 g/dL (ref 6.0–8.3)

## 2013-07-08 LAB — CBC WITH DIFFERENTIAL/PLATELET
Basophils Absolute: 0 10*3/uL (ref 0.0–0.1)
Basophils Relative: 0.3 % (ref 0.0–3.0)
EOS PCT: 2.9 % (ref 0.0–5.0)
Eosinophils Absolute: 0.1 10*3/uL (ref 0.0–0.7)
HCT: 43.4 % (ref 36.0–46.0)
Hemoglobin: 14.2 g/dL (ref 12.0–15.0)
Lymphocytes Relative: 34 % (ref 12.0–46.0)
Lymphs Abs: 1.6 10*3/uL (ref 0.7–4.0)
MCHC: 32.8 g/dL (ref 30.0–36.0)
MCV: 94.6 fl (ref 78.0–100.0)
MONO ABS: 0.3 10*3/uL (ref 0.1–1.0)
Monocytes Relative: 6.3 % (ref 3.0–12.0)
Neutro Abs: 2.7 10*3/uL (ref 1.4–7.7)
Neutrophils Relative %: 56.5 % (ref 43.0–77.0)
PLATELETS: 213 10*3/uL (ref 150.0–400.0)
RBC: 4.59 Mil/uL (ref 3.87–5.11)
RDW: 13.4 % (ref 11.5–14.6)
WBC: 4.8 10*3/uL (ref 4.5–10.5)

## 2013-07-08 LAB — TSH: TSH: 3.07 u[IU]/mL (ref 0.35–5.50)

## 2013-07-08 LAB — LDL CHOLESTEROL, DIRECT: Direct LDL: 120.4 mg/dL

## 2013-07-08 NOTE — Progress Notes (Signed)
Subjective:    Patient ID: Deborah Baxter, female    DOB: 15-May-1934, 80 y.o.   MRN: [id number]  HPI 80 year old female with past history of spinal stenosis with chronic back and leg pain and hypercholesterolemia who comes in today to follow up on these issues as well as for a complete physical exam.   States she had been doing well.  Feels better   No chest pain or tightness.  Breathing stable.  Eating and drinking well.  Bowels better.  Taking miralax. No urinary change.  Back pain much improved.  Was seeing Dr Joyce Copa.  Doing home exercises.     Past Medical History  Diagnosis Date  . Hypercholesterolemia   . Spinal stenosis     chronic back and leg pain  . Hx of degenerative disc disease   . Osteoarthritis     Current Outpatient Prescriptions on File Prior to Visit  Medication Sig Dispense Refill  . acetaminophen (TYLENOL) 650 MG CR tablet Take 650 mg by mouth every 8 (eight) hours as needed. 2 tabs daily      . Calcium Carbonate-Vitamin D (CALCIUM 500 + D PO) Take by mouth 2 (two) times daily. 1 tab by mouth bid      . ibuprofen (ADVIL,MOTRIN) 200 MG tablet Take 200 mg by mouth every 6 (six) hours as needed for pain.      . Multiple Vitamin (MULTIVITAMIN) tablet Take 1 tablet by mouth daily.      . Polyethylene Glycol 3350 (MIRALAX PO) Take by mouth. Every 3 days       No current facility-administered medications on file prior to visit.    Review of Systems Patient denies any headache, lightheadedness or dizziness.  No sinus or allergy symptoms.  No chest pain, tightness or palpitations.  No increased shortness of breath, cough or congestion.  No nausea or vomiting.  No acid reflux.  No abdominal pain or cramping.  Bowels doing better on miralax.  No BRBPR or melana.  No urine change.  Back better.  Doing home exercises.       Objective:   Physical Exam  Filed Vitals:   07/08/13 0820  BP: 130/80  Pulse: 70  Temp: 97.4 F (58.64 C)   80 year old female in no acute distress.    HEENT:  Nares- clear.  Oropharynx - without lesions. NECK:  Supple.  Nontender.  No audible bruit.  HEART:  Appears to be regular. LUNGS:  No crackles or wheezing audible.  Respirations even and unlabored.  RADIAL PULSE:  Equal bilaterally.    BREASTS:  No nipple discharge or nipple retraction present.  Could not appreciate any distinct nodules or axillary adenopathy.  ABDOMEN:  Soft, nontender.  Bowel sounds present and normal.  No audible abdominal bruit.  GU:  Not performed.     EXTREMITIES:  No increased edema present.  DP pulses palpable and equal bilaterally.           Assessment & Plan:  MSK.  Has known spinal stenosis.  Previous back/hip pain.  Better now.  Continue home exercises.     CARDIOVASCULAR.  Asymptomatic.  Follow.    GI.  Heme positive on previous visit.  Referred to GI last visit.  Colonoscopy 07/01/03 - normal.  Had a follow up colonoscopy 08/31/12.  Bowels better on miralax.    HEALTH MAINTENANCE.  Physical today.  Mammogram 03/26/13 - Birads I.  Last colonoscopy 08/31/12.   Normal bone density. 04/06/10.  Pap 06/26/12 -  negative.  Discussed follow up bone density.  She wants to postpone at this time.  prevnar given today.

## 2013-07-08 NOTE — Progress Notes (Signed)
Pre-visit discussion using our clinic review tool. No additional management support is needed unless otherwise documented below in the visit note.  

## 2013-07-09 ENCOUNTER — Other Ambulatory Visit: Payer: Self-pay | Admitting: Internal Medicine

## 2013-07-09 DIAGNOSIS — E875 Hyperkalemia: Secondary | ICD-10-CM

## 2013-07-09 NOTE — Progress Notes (Signed)
Order placed for f/u potassium.  

## 2013-07-13 ENCOUNTER — Encounter: Payer: Self-pay | Admitting: Internal Medicine

## 2013-07-13 NOTE — Assessment & Plan Note (Signed)
Chronic back and leg pain.  Has been stable.  Previous increased pain - last visit.  Doing better now.  Continue home exercise.

## 2013-07-13 NOTE — Assessment & Plan Note (Signed)
Low cholesterol diet and exercise.  Follow lipid panel.   

## 2013-07-15 ENCOUNTER — Other Ambulatory Visit (INDEPENDENT_AMBULATORY_CARE_PROVIDER_SITE_OTHER): Payer: Medicare Other

## 2013-07-15 DIAGNOSIS — E875 Hyperkalemia: Secondary | ICD-10-CM | POA: Diagnosis not present

## 2013-07-15 LAB — POTASSIUM: Potassium: 4.5 mEq/L (ref 3.5–5.1)

## 2013-07-16 ENCOUNTER — Encounter: Payer: Self-pay | Admitting: *Deleted

## 2013-08-05 DIAGNOSIS — M955 Acquired deformity of pelvis: Secondary | ICD-10-CM | POA: Diagnosis not present

## 2013-08-05 DIAGNOSIS — M999 Biomechanical lesion, unspecified: Secondary | ICD-10-CM | POA: Diagnosis not present

## 2013-08-05 DIAGNOSIS — M5137 Other intervertebral disc degeneration, lumbosacral region: Secondary | ICD-10-CM | POA: Diagnosis not present

## 2013-09-02 DIAGNOSIS — M955 Acquired deformity of pelvis: Secondary | ICD-10-CM | POA: Diagnosis not present

## 2013-09-02 DIAGNOSIS — M5137 Other intervertebral disc degeneration, lumbosacral region: Secondary | ICD-10-CM | POA: Diagnosis not present

## 2013-09-02 DIAGNOSIS — M999 Biomechanical lesion, unspecified: Secondary | ICD-10-CM | POA: Diagnosis not present

## 2013-09-22 DIAGNOSIS — H2510 Age-related nuclear cataract, unspecified eye: Secondary | ICD-10-CM | POA: Diagnosis not present

## 2013-09-30 DIAGNOSIS — M999 Biomechanical lesion, unspecified: Secondary | ICD-10-CM | POA: Diagnosis not present

## 2013-09-30 DIAGNOSIS — M955 Acquired deformity of pelvis: Secondary | ICD-10-CM | POA: Diagnosis not present

## 2013-09-30 DIAGNOSIS — M5137 Other intervertebral disc degeneration, lumbosacral region: Secondary | ICD-10-CM | POA: Diagnosis not present

## 2013-10-05 ENCOUNTER — Encounter: Payer: Self-pay | Admitting: Internal Medicine

## 2013-10-05 ENCOUNTER — Ambulatory Visit (INDEPENDENT_AMBULATORY_CARE_PROVIDER_SITE_OTHER): Payer: Medicare Other | Admitting: Internal Medicine

## 2013-10-05 VITALS — BP 118/70 | HR 83 | Temp 98.2°F | Ht 66.5 in | Wt 160.2 lb

## 2013-10-05 DIAGNOSIS — M4800 Spinal stenosis, site unspecified: Secondary | ICD-10-CM | POA: Diagnosis not present

## 2013-10-05 DIAGNOSIS — K5730 Diverticulosis of large intestine without perforation or abscess without bleeding: Secondary | ICD-10-CM | POA: Diagnosis not present

## 2013-10-05 DIAGNOSIS — E7800 Pure hypercholesterolemia, unspecified: Secondary | ICD-10-CM | POA: Diagnosis not present

## 2013-10-05 DIAGNOSIS — K5790 Diverticulosis of intestine, part unspecified, without perforation or abscess without bleeding: Secondary | ICD-10-CM

## 2013-10-05 NOTE — Progress Notes (Signed)
  Subjective:    Patient ID: Deborah Baxter, female    DOB: [birth year], 80 y.o.   MRN: [id number]  HPI 80 year old female with past history of spinal stenosis with chronic back and leg pain and hypercholesterolemia who comes in today for a scheduled follow up.  States she had been doing well.   No chest pain or tightness.  Breathing stable.  Eating and drinking well.  Bowels better.  Taking miralax. No urinary change.  Back pain much improved.  Sees Dr Joyce Copa.  Doing home exercises.     Past Medical History  Diagnosis Date  . Hypercholesterolemia   . Spinal stenosis     chronic back and leg pain  . Hx of degenerative disc disease   . Osteoarthritis     Current Outpatient Prescriptions on File Prior to Visit  Medication Sig Dispense Refill  . acetaminophen (TYLENOL) 650 MG CR tablet Take 650 mg by mouth every 8 (eight) hours as needed. 2 tabs daily      . Calcium Carbonate-Vitamin D (CALCIUM 500 + D PO) Take by mouth 2 (two) times daily. 1 tab by mouth bid      . ibuprofen (ADVIL,MOTRIN) 200 MG tablet Take 200 mg by mouth every 6 (six) hours as needed for pain.      . Multiple Vitamin (MULTIVITAMIN) tablet Take 1 tablet by mouth daily.      . Polyethylene Glycol 3350 (MIRALAX PO) Take by mouth. Every 3 days       No current facility-administered medications on file prior to visit.    Review of Systems Patient denies any headache, lightheadedness or dizziness.  No sinus or allergy symptoms.  No chest pain, tightness or palpitations.  No increased shortness of breath, cough or congestion.  No nausea or vomiting.  No acid reflux.  No abdominal pain or cramping.  Bowels doing better on miralax.  No BRBPR or melana.  No urine change.  Back better.  Doing home exercises.       Objective:   Physical Exam  Filed Vitals:   10/05/13 0901  BP: 118/70  Pulse: 83  Temp: 98.2 F (55.28 C)   80 year old female in no acute distress.   HEENT:  Nares- clear.  Oropharynx - without lesions. NECK:   Supple.  Nontender.  No audible bruit.  HEART:  Appears to be regular. LUNGS:  No crackles or wheezing audible.  Respirations even and unlabored.  RADIAL PULSE:  Equal bilaterally.   ABDOMEN:  Soft, nontender.  Bowel sounds present and normal.  No audible abdominal bruit.     EXTREMITIES:  No increased edema present.  DP pulses palpable and equal bilaterally.           Assessment & Plan:  MSK.  Has known spinal stenosis.  Previous back/hip pain.  Better now.  Continue home exercises.     CARDIOVASCULAR.  Asymptomatic.  Follow.    GI.  Heme positive on previous visit.  Referred to GI.  Colonoscopy 07/01/03 - normal.  Bowels better on miralax.    HEALTH MAINTENANCE.  Physical last visit.  Mammogram 03/26/13 - Birads I.  Last colonoscopy 08/31/12.   Normal bone density. 04/06/10.  Pap 06/26/12 - negative.  Discussed follow up bone density.  She wants to postpone at this time.

## 2013-10-05 NOTE — Progress Notes (Signed)
Pre visit review using our clinic review tool, if applicable. No additional management support is needed unless otherwise documented below in the visit note. 

## 2013-10-06 ENCOUNTER — Encounter: Payer: Self-pay | Admitting: Internal Medicine

## 2013-10-06 NOTE — Assessment & Plan Note (Signed)
Colonoscopy as outlined.  Currently doing well.   

## 2013-10-06 NOTE — Assessment & Plan Note (Signed)
Low cholesterol diet and exercise.  Follow lipid panel.   

## 2013-10-06 NOTE — Assessment & Plan Note (Signed)
Chronic back and leg pain.  Has been stable.  Previous increased pain - last visit.  Doing better now.  Continue home exercise.

## 2013-10-28 DIAGNOSIS — M5137 Other intervertebral disc degeneration, lumbosacral region: Secondary | ICD-10-CM | POA: Diagnosis not present

## 2013-10-28 DIAGNOSIS — M999 Biomechanical lesion, unspecified: Secondary | ICD-10-CM | POA: Diagnosis not present

## 2013-10-28 DIAGNOSIS — M955 Acquired deformity of pelvis: Secondary | ICD-10-CM | POA: Diagnosis not present

## 2013-11-25 DIAGNOSIS — M955 Acquired deformity of pelvis: Secondary | ICD-10-CM | POA: Diagnosis not present

## 2013-11-25 DIAGNOSIS — M999 Biomechanical lesion, unspecified: Secondary | ICD-10-CM | POA: Diagnosis not present

## 2013-11-25 DIAGNOSIS — M5137 Other intervertebral disc degeneration, lumbosacral region: Secondary | ICD-10-CM | POA: Diagnosis not present

## 2013-11-29 DIAGNOSIS — L578 Other skin changes due to chronic exposure to nonionizing radiation: Secondary | ICD-10-CM | POA: Diagnosis not present

## 2013-11-29 DIAGNOSIS — L821 Other seborrheic keratosis: Secondary | ICD-10-CM | POA: Diagnosis not present

## 2013-11-29 DIAGNOSIS — D1801 Hemangioma of skin and subcutaneous tissue: Secondary | ICD-10-CM | POA: Diagnosis not present

## 2013-12-30 DIAGNOSIS — M5137 Other intervertebral disc degeneration, lumbosacral region: Secondary | ICD-10-CM | POA: Diagnosis not present

## 2013-12-30 DIAGNOSIS — M999 Biomechanical lesion, unspecified: Secondary | ICD-10-CM | POA: Diagnosis not present

## 2013-12-30 DIAGNOSIS — M955 Acquired deformity of pelvis: Secondary | ICD-10-CM | POA: Diagnosis not present

## 2014-01-27 DIAGNOSIS — M5137 Other intervertebral disc degeneration, lumbosacral region: Secondary | ICD-10-CM | POA: Diagnosis not present

## 2014-01-27 DIAGNOSIS — M999 Biomechanical lesion, unspecified: Secondary | ICD-10-CM | POA: Diagnosis not present

## 2014-01-27 DIAGNOSIS — M955 Acquired deformity of pelvis: Secondary | ICD-10-CM | POA: Diagnosis not present

## 2014-02-24 DIAGNOSIS — M999 Biomechanical lesion, unspecified: Secondary | ICD-10-CM | POA: Diagnosis not present

## 2014-02-24 DIAGNOSIS — M955 Acquired deformity of pelvis: Secondary | ICD-10-CM | POA: Diagnosis not present

## 2014-02-24 DIAGNOSIS — M5137 Other intervertebral disc degeneration, lumbosacral region: Secondary | ICD-10-CM | POA: Diagnosis not present

## 2014-03-08 ENCOUNTER — Ambulatory Visit (INDEPENDENT_AMBULATORY_CARE_PROVIDER_SITE_OTHER): Payer: Medicare Other | Admitting: Internal Medicine

## 2014-03-08 ENCOUNTER — Encounter: Payer: Self-pay | Admitting: Internal Medicine

## 2014-03-08 VITALS — BP 120/80 | HR 73 | Temp 97.8°F | Ht 66.5 in | Wt 155.5 lb

## 2014-03-08 DIAGNOSIS — E7800 Pure hypercholesterolemia, unspecified: Secondary | ICD-10-CM

## 2014-03-08 DIAGNOSIS — M4807 Spinal stenosis, lumbosacral region: Secondary | ICD-10-CM | POA: Diagnosis not present

## 2014-03-08 DIAGNOSIS — Z1239 Encounter for other screening for malignant neoplasm of breast: Secondary | ICD-10-CM

## 2014-03-08 DIAGNOSIS — K5730 Diverticulosis of large intestine without perforation or abscess without bleeding: Secondary | ICD-10-CM

## 2014-03-08 DIAGNOSIS — Z23 Encounter for immunization: Secondary | ICD-10-CM | POA: Diagnosis not present

## 2014-03-08 LAB — LIPID PANEL
CHOL/HDL RATIO: 3
Cholesterol: 219 mg/dL — ABNORMAL HIGH (ref 0–200)
HDL: 71 mg/dL (ref >39.00)
LDL CALC: 133 mg/dL — AB (ref 0–99)
NonHDL: 148
TRIGLYCERIDES: 76 mg/dL (ref 0.0–149.0)
VLDL: 15.2 mg/dL (ref 0.0–40.0)

## 2014-03-08 NOTE — Progress Notes (Signed)
  Subjective:    Patient ID: Deborah Baxter, female    DOB: 1933-06-12, 80 y.o.   MRN: [id number]  HPI 80 year old female with past history of spinal stenosis with chronic back and leg pain and hypercholesterolemia who comes in today for a scheduled follow up.  States she has been doing well. No chest pain or tightness.  Breathing stable.  Eating and drinking well.  Bowels better.  Taking miralax.  No blood.  No urinary change.  Back pain much improved.  Sees Dr Joyce Copa.  Doing home exercises.  Not limiting her activity.     Past Medical History  Diagnosis Date  . Hypercholesterolemia   . Spinal stenosis     chronic back and leg pain  . Hx of degenerative disc disease   . Osteoarthritis     Current Outpatient Prescriptions on File Prior to Visit  Medication Sig Dispense Refill  . acetaminophen (TYLENOL) 650 MG CR tablet Take 650 mg by mouth every 8 (eight) hours as needed. 2 tabs daily      . Calcium Carbonate-Vitamin D (CALCIUM 500 + D PO) Take by mouth 2 (two) times daily. 1 tab by mouth bid      . ibuprofen (ADVIL,MOTRIN) 200 MG tablet Take 200 mg by mouth every 6 (six) hours as needed for pain.      . Multiple Vitamin (MULTIVITAMIN) tablet Take 1 tablet by mouth daily.      . Polyethylene Glycol 3350 (MIRALAX PO) Take by mouth. Every 3 days       No current facility-administered medications on file prior to visit.    Review of Systems Patient denies any headache, lightheadedness or dizziness.  No sinus or allergy symptoms.  No chest pain, tightness or palpitations.  No increased shortness of breath, cough or congestion.  No nausea or vomiting.  No acid reflux.  No abdominal pain or cramping.  Bowels doing better on miralax.  No BRBPR or melana.  No urine change.  Back better.       Objective:   Physical Exam  Filed Vitals:   03/08/14 0843  BP: 120/80  Pulse: 73  Temp: 97.8 F (36.6 C)   Blood pressure recheck:  118/68, pulse 83  80 year old female in no acute distress.    HEENT:  Nares- clear.  Oropharynx - without lesions. NECK:  Supple.  Nontender.  No audible bruit.  HEART:  Appears to be regular. LUNGS:  No crackles or wheezing audible.  Respirations even and unlabored.  RADIAL PULSE:  Equal bilaterally.   ABDOMEN:  Soft, nontender.  Bowel sounds present and normal.  No audible abdominal bruit.     EXTREMITIES:  No increased edema present.  DP pulses palpable and equal bilaterally.           Assessment & Plan:  MSK.  Has known spinal stenosis.  Previous back/hip pain.  Better now.  Continue home exercises.     CARDIOVASCULAR.  Asymptomatic.  Follow.    GI.  Heme positive on previous visit.  Referred to GI.  Colonoscopy 08/31/12 redundant colon and diverticulosis.    HEALTH MAINTENANCE.  Physical 07/08/13.   Mammogram 03/26/13 - Birads I.  Schedule f/u mammogram.  Last colonoscopy 08/31/12.   Normal bone density. 04/06/10.  Pap 06/26/12 - negative.  Have discussed follow up bone density.

## 2014-03-08 NOTE — Progress Notes (Signed)
Pre visit review using our clinic review tool, if applicable. No additional management support is needed unless otherwise documented below in the visit note. 

## 2014-03-09 ENCOUNTER — Encounter: Payer: Self-pay | Admitting: *Deleted

## 2014-03-13 ENCOUNTER — Encounter: Payer: Self-pay | Admitting: Internal Medicine

## 2014-03-13 NOTE — Assessment & Plan Note (Signed)
Colonoscopy as outlined.  Currently doing well.

## 2014-03-13 NOTE — Assessment & Plan Note (Signed)
Low cholesterol diet and exercise.  Follow lipid panel.   

## 2014-03-13 NOTE — Assessment & Plan Note (Signed)
Chronic back and leg pain.  Has been stable.  Previous increased pain.   Doing better now.  Continue home exercise.

## 2014-03-24 DIAGNOSIS — M5136 Other intervertebral disc degeneration, lumbar region: Secondary | ICD-10-CM | POA: Diagnosis not present

## 2014-03-24 DIAGNOSIS — M9903 Segmental and somatic dysfunction of lumbar region: Secondary | ICD-10-CM | POA: Diagnosis not present

## 2014-03-24 DIAGNOSIS — M955 Acquired deformity of pelvis: Secondary | ICD-10-CM | POA: Diagnosis not present

## 2014-03-24 DIAGNOSIS — M9905 Segmental and somatic dysfunction of pelvic region: Secondary | ICD-10-CM | POA: Diagnosis not present

## 2014-03-31 ENCOUNTER — Ambulatory Visit: Payer: Self-pay | Admitting: Internal Medicine

## 2014-03-31 ENCOUNTER — Encounter: Payer: Self-pay | Admitting: *Deleted

## 2014-03-31 DIAGNOSIS — Z1231 Encounter for screening mammogram for malignant neoplasm of breast: Secondary | ICD-10-CM | POA: Diagnosis not present

## 2014-03-31 LAB — HM MAMMOGRAPHY: HM Mammogram: NEGATIVE

## 2014-04-15 ENCOUNTER — Encounter: Payer: Self-pay | Admitting: Internal Medicine

## 2014-04-21 DIAGNOSIS — M955 Acquired deformity of pelvis: Secondary | ICD-10-CM | POA: Diagnosis not present

## 2014-04-21 DIAGNOSIS — M9905 Segmental and somatic dysfunction of pelvic region: Secondary | ICD-10-CM | POA: Diagnosis not present

## 2014-04-21 DIAGNOSIS — M9903 Segmental and somatic dysfunction of lumbar region: Secondary | ICD-10-CM | POA: Diagnosis not present

## 2014-04-21 DIAGNOSIS — M5136 Other intervertebral disc degeneration, lumbar region: Secondary | ICD-10-CM | POA: Diagnosis not present

## 2014-05-20 DIAGNOSIS — M9905 Segmental and somatic dysfunction of pelvic region: Secondary | ICD-10-CM | POA: Diagnosis not present

## 2014-05-20 DIAGNOSIS — M5136 Other intervertebral disc degeneration, lumbar region: Secondary | ICD-10-CM | POA: Diagnosis not present

## 2014-05-20 DIAGNOSIS — M955 Acquired deformity of pelvis: Secondary | ICD-10-CM | POA: Diagnosis not present

## 2014-05-20 DIAGNOSIS — M9903 Segmental and somatic dysfunction of lumbar region: Secondary | ICD-10-CM | POA: Diagnosis not present

## 2014-06-07 DIAGNOSIS — M9903 Segmental and somatic dysfunction of lumbar region: Secondary | ICD-10-CM | POA: Diagnosis not present

## 2014-06-07 DIAGNOSIS — M955 Acquired deformity of pelvis: Secondary | ICD-10-CM | POA: Diagnosis not present

## 2014-06-07 DIAGNOSIS — M9905 Segmental and somatic dysfunction of pelvic region: Secondary | ICD-10-CM | POA: Diagnosis not present

## 2014-06-07 DIAGNOSIS — M5136 Other intervertebral disc degeneration, lumbar region: Secondary | ICD-10-CM | POA: Diagnosis not present

## 2014-06-09 DIAGNOSIS — M955 Acquired deformity of pelvis: Secondary | ICD-10-CM | POA: Diagnosis not present

## 2014-06-09 DIAGNOSIS — M9903 Segmental and somatic dysfunction of lumbar region: Secondary | ICD-10-CM | POA: Diagnosis not present

## 2014-06-09 DIAGNOSIS — M9905 Segmental and somatic dysfunction of pelvic region: Secondary | ICD-10-CM | POA: Diagnosis not present

## 2014-06-09 DIAGNOSIS — M5136 Other intervertebral disc degeneration, lumbar region: Secondary | ICD-10-CM | POA: Diagnosis not present

## 2014-06-11 DIAGNOSIS — M955 Acquired deformity of pelvis: Secondary | ICD-10-CM | POA: Diagnosis not present

## 2014-06-11 DIAGNOSIS — M5136 Other intervertebral disc degeneration, lumbar region: Secondary | ICD-10-CM | POA: Diagnosis not present

## 2014-06-11 DIAGNOSIS — M9905 Segmental and somatic dysfunction of pelvic region: Secondary | ICD-10-CM | POA: Diagnosis not present

## 2014-06-11 DIAGNOSIS — M9903 Segmental and somatic dysfunction of lumbar region: Secondary | ICD-10-CM | POA: Diagnosis not present

## 2014-06-13 DIAGNOSIS — M9903 Segmental and somatic dysfunction of lumbar region: Secondary | ICD-10-CM | POA: Diagnosis not present

## 2014-06-13 DIAGNOSIS — M5136 Other intervertebral disc degeneration, lumbar region: Secondary | ICD-10-CM | POA: Diagnosis not present

## 2014-06-13 DIAGNOSIS — M9905 Segmental and somatic dysfunction of pelvic region: Secondary | ICD-10-CM | POA: Diagnosis not present

## 2014-06-13 DIAGNOSIS — M955 Acquired deformity of pelvis: Secondary | ICD-10-CM | POA: Diagnosis not present

## 2014-06-16 DIAGNOSIS — M9905 Segmental and somatic dysfunction of pelvic region: Secondary | ICD-10-CM | POA: Diagnosis not present

## 2014-06-16 DIAGNOSIS — M955 Acquired deformity of pelvis: Secondary | ICD-10-CM | POA: Diagnosis not present

## 2014-06-16 DIAGNOSIS — M5136 Other intervertebral disc degeneration, lumbar region: Secondary | ICD-10-CM | POA: Diagnosis not present

## 2014-06-16 DIAGNOSIS — M9903 Segmental and somatic dysfunction of lumbar region: Secondary | ICD-10-CM | POA: Diagnosis not present

## 2014-06-20 DIAGNOSIS — M9903 Segmental and somatic dysfunction of lumbar region: Secondary | ICD-10-CM | POA: Diagnosis not present

## 2014-06-20 DIAGNOSIS — M9905 Segmental and somatic dysfunction of pelvic region: Secondary | ICD-10-CM | POA: Diagnosis not present

## 2014-06-20 DIAGNOSIS — M955 Acquired deformity of pelvis: Secondary | ICD-10-CM | POA: Diagnosis not present

## 2014-06-20 DIAGNOSIS — M5136 Other intervertebral disc degeneration, lumbar region: Secondary | ICD-10-CM | POA: Diagnosis not present

## 2014-06-22 DIAGNOSIS — M955 Acquired deformity of pelvis: Secondary | ICD-10-CM | POA: Diagnosis not present

## 2014-06-22 DIAGNOSIS — M9903 Segmental and somatic dysfunction of lumbar region: Secondary | ICD-10-CM | POA: Diagnosis not present

## 2014-06-22 DIAGNOSIS — M5136 Other intervertebral disc degeneration, lumbar region: Secondary | ICD-10-CM | POA: Diagnosis not present

## 2014-06-22 DIAGNOSIS — M9905 Segmental and somatic dysfunction of pelvic region: Secondary | ICD-10-CM | POA: Diagnosis not present

## 2014-06-27 DIAGNOSIS — M5136 Other intervertebral disc degeneration, lumbar region: Secondary | ICD-10-CM | POA: Diagnosis not present

## 2014-06-27 DIAGNOSIS — M9905 Segmental and somatic dysfunction of pelvic region: Secondary | ICD-10-CM | POA: Diagnosis not present

## 2014-06-27 DIAGNOSIS — M955 Acquired deformity of pelvis: Secondary | ICD-10-CM | POA: Diagnosis not present

## 2014-06-27 DIAGNOSIS — M9903 Segmental and somatic dysfunction of lumbar region: Secondary | ICD-10-CM | POA: Diagnosis not present

## 2014-06-29 DIAGNOSIS — M9903 Segmental and somatic dysfunction of lumbar region: Secondary | ICD-10-CM | POA: Diagnosis not present

## 2014-06-29 DIAGNOSIS — M5136 Other intervertebral disc degeneration, lumbar region: Secondary | ICD-10-CM | POA: Diagnosis not present

## 2014-06-29 DIAGNOSIS — M9905 Segmental and somatic dysfunction of pelvic region: Secondary | ICD-10-CM | POA: Diagnosis not present

## 2014-06-29 DIAGNOSIS — M955 Acquired deformity of pelvis: Secondary | ICD-10-CM | POA: Diagnosis not present

## 2014-07-01 DIAGNOSIS — M9905 Segmental and somatic dysfunction of pelvic region: Secondary | ICD-10-CM | POA: Diagnosis not present

## 2014-07-01 DIAGNOSIS — M955 Acquired deformity of pelvis: Secondary | ICD-10-CM | POA: Diagnosis not present

## 2014-07-01 DIAGNOSIS — M5136 Other intervertebral disc degeneration, lumbar region: Secondary | ICD-10-CM | POA: Diagnosis not present

## 2014-07-01 DIAGNOSIS — M9903 Segmental and somatic dysfunction of lumbar region: Secondary | ICD-10-CM | POA: Diagnosis not present

## 2014-07-04 DIAGNOSIS — M5136 Other intervertebral disc degeneration, lumbar region: Secondary | ICD-10-CM | POA: Diagnosis not present

## 2014-07-04 DIAGNOSIS — M9903 Segmental and somatic dysfunction of lumbar region: Secondary | ICD-10-CM | POA: Diagnosis not present

## 2014-07-04 DIAGNOSIS — M9905 Segmental and somatic dysfunction of pelvic region: Secondary | ICD-10-CM | POA: Diagnosis not present

## 2014-07-04 DIAGNOSIS — M955 Acquired deformity of pelvis: Secondary | ICD-10-CM | POA: Diagnosis not present

## 2014-07-08 DIAGNOSIS — M9903 Segmental and somatic dysfunction of lumbar region: Secondary | ICD-10-CM | POA: Diagnosis not present

## 2014-07-08 DIAGNOSIS — M5136 Other intervertebral disc degeneration, lumbar region: Secondary | ICD-10-CM | POA: Diagnosis not present

## 2014-07-08 DIAGNOSIS — M9905 Segmental and somatic dysfunction of pelvic region: Secondary | ICD-10-CM | POA: Diagnosis not present

## 2014-07-08 DIAGNOSIS — M955 Acquired deformity of pelvis: Secondary | ICD-10-CM | POA: Diagnosis not present

## 2014-07-12 ENCOUNTER — Ambulatory Visit (INDEPENDENT_AMBULATORY_CARE_PROVIDER_SITE_OTHER): Payer: Medicare Other | Admitting: Internal Medicine

## 2014-07-12 ENCOUNTER — Encounter: Payer: Self-pay | Admitting: Internal Medicine

## 2014-07-12 VITALS — BP 130/78 | HR 83 | Temp 97.8°F | Ht 67.0 in | Wt 162.5 lb

## 2014-07-12 DIAGNOSIS — Z23 Encounter for immunization: Secondary | ICD-10-CM

## 2014-07-12 DIAGNOSIS — M5136 Other intervertebral disc degeneration, lumbar region: Secondary | ICD-10-CM | POA: Diagnosis not present

## 2014-07-12 DIAGNOSIS — K5730 Diverticulosis of large intestine without perforation or abscess without bleeding: Secondary | ICD-10-CM

## 2014-07-12 DIAGNOSIS — M955 Acquired deformity of pelvis: Secondary | ICD-10-CM | POA: Diagnosis not present

## 2014-07-12 DIAGNOSIS — M9905 Segmental and somatic dysfunction of pelvic region: Secondary | ICD-10-CM | POA: Diagnosis not present

## 2014-07-12 DIAGNOSIS — E7800 Pure hypercholesterolemia, unspecified: Secondary | ICD-10-CM

## 2014-07-12 DIAGNOSIS — Z0000 Encounter for general adult medical examination without abnormal findings: Secondary | ICD-10-CM

## 2014-07-12 DIAGNOSIS — M9903 Segmental and somatic dysfunction of lumbar region: Secondary | ICD-10-CM | POA: Diagnosis not present

## 2014-07-12 DIAGNOSIS — M4807 Spinal stenosis, lumbosacral region: Secondary | ICD-10-CM

## 2014-07-12 DIAGNOSIS — M545 Low back pain: Secondary | ICD-10-CM

## 2014-07-12 DIAGNOSIS — R5383 Other fatigue: Secondary | ICD-10-CM

## 2014-07-12 LAB — CBC WITH DIFFERENTIAL/PLATELET
Basophils Absolute: 0 10*3/uL (ref 0.0–0.1)
Basophils Relative: 0.5 % (ref 0.0–3.0)
EOS ABS: 0.2 10*3/uL (ref 0.0–0.7)
Eosinophils Relative: 2.7 % (ref 0.0–5.0)
HEMATOCRIT: 40.9 % (ref 36.0–46.0)
Hemoglobin: 13.8 g/dL (ref 12.0–15.0)
LYMPHS ABS: 1.6 10*3/uL (ref 0.7–4.0)
Lymphocytes Relative: 28.2 % (ref 12.0–46.0)
MCHC: 33.8 g/dL (ref 30.0–36.0)
MCV: 90.7 fl (ref 78.0–100.0)
Monocytes Absolute: 0.4 10*3/uL (ref 0.1–1.0)
Monocytes Relative: 6.4 % (ref 3.0–12.0)
NEUTROS PCT: 62.2 % (ref 43.0–77.0)
Neutro Abs: 3.6 10*3/uL (ref 1.4–7.7)
PLATELETS: 240 10*3/uL (ref 150.0–400.0)
RBC: 4.51 Mil/uL (ref 3.87–5.11)
RDW: 13.5 % (ref 11.5–15.5)
WBC: 5.7 10*3/uL (ref 4.0–10.5)

## 2014-07-12 LAB — LIPID PANEL
CHOLESTEROL: 204 mg/dL — AB (ref 0–200)
HDL: 75.4 mg/dL (ref >39.00)
LDL Cholesterol: 110 mg/dL — ABNORMAL HIGH (ref 0–99)
NonHDL: 128.6
Total CHOL/HDL Ratio: 3
Triglycerides: 91 mg/dL (ref 0.0–149.0)
VLDL: 18.2 mg/dL (ref 0.0–40.0)

## 2014-07-12 LAB — COMPREHENSIVE METABOLIC PANEL
ALBUMIN: 4 g/dL (ref 3.5–5.2)
ALT: 15 U/L (ref 0–35)
AST: 18 U/L (ref 0–37)
Alkaline Phosphatase: 42 U/L (ref 39–117)
BUN: 16 mg/dL (ref 6–23)
CO2: 30 mEq/L (ref 19–32)
CREATININE: 0.63 mg/dL (ref 0.40–1.20)
Calcium: 9.7 mg/dL (ref 8.4–10.5)
Chloride: 105 mEq/L (ref 96–112)
GFR: 97.02 mL/min (ref >60.00)
Glucose, Bld: 92 mg/dL (ref 70–99)
POTASSIUM: 4.5 meq/L (ref 3.5–5.1)
SODIUM: 140 meq/L (ref 135–145)
Total Bilirubin: 0.5 mg/dL (ref 0.2–1.2)
Total Protein: 6.8 g/dL (ref 6.0–8.3)

## 2014-07-12 LAB — TSH: TSH: 1.58 u[IU]/mL (ref 0.35–4.50)

## 2014-07-12 NOTE — Progress Notes (Signed)
Patient ID: Deborah Baxter, female   DOB: 1934/06/02, 81 y.o.   MRN: [id number]   Subjective:    Patient ID: Deborah Baxter, female    DOB: 1934/02/25, 81 y.o.   MRN: [id number]  HPI  Patient here today for a physical.  Has known spinal stenosis and hypercholesterolemia.  She is accompanied by her daughter.  History obtained from both of them.  Had a flare with her back after Christmas.  Saw her chiropractor.  Better now.  Takes tylenol occasionally.  Desires no further intervention.  Stays active.  No cardiac symptoms with increased activity or exertion.     Past Medical History  Diagnosis Date  . Hypercholesterolemia   . Spinal stenosis     chronic back and leg pain  . Hx of degenerative disc disease   . Osteoarthritis     Outpatient Encounter Prescriptions as of 07/12/2014  Medication Sig  . acetaminophen (TYLENOL) 650 MG CR tablet Take 650 mg by mouth every 8 (eight) hours as needed. 2 tabs daily  . Calcium Carbonate-Vitamin D (CALCIUM 500 + D PO) Take by mouth 2 (two) times daily. 1 tab by mouth bid  . ibuprofen (ADVIL,MOTRIN) 200 MG tablet Take 200 mg by mouth every 6 (six) hours as needed for pain.  . Multiple Vitamin (MULTIVITAMIN) tablet Take 1 tablet by mouth daily.  . Polyethylene Glycol 3350 (MIRALAX PO) Take by mouth. Every 3 days    Review of Systems  Constitutional: Negative for appetite change and unexpected weight change.  HENT: Negative for congestion (no significant congestion.  ), sinus pressure and sore throat.   Eyes: Negative for pain and visual disturbance.  Respiratory: Negative for cough, chest tightness and shortness of breath.   Cardiovascular: Negative for chest pain, palpitations and leg swelling.  Gastrointestinal: Negative for nausea, vomiting, abdominal pain, diarrhea and constipation.  Genitourinary: Negative for frequency and difficulty urinating.  Musculoskeletal: Positive for back pain (back pain is better since seeing a chiropractor.  ). Negative for  joint swelling.  Skin: Negative for color change and rash.  Neurological: Negative for dizziness, light-headedness and headaches.  Hematological: Negative for adenopathy. Does not bruise/bleed easily.  Psychiatric/Behavioral: Negative for dysphoric mood and decreased concentration.       Objective:    Physical Exam  Constitutional: She is oriented to person, place, and time. She appears well-developed and well-nourished. No distress.  HENT:  Nose: Nose normal.  Mouth/Throat: Oropharynx is clear and moist.  Eyes: Right eye exhibits no discharge. Left eye exhibits no discharge. No scleral icterus.  Neck: Neck supple. No thyromegaly present.  Cardiovascular: Normal rate and regular rhythm.   Pulmonary/Chest: Breath sounds normal. No accessory muscle usage. No tachypnea. No respiratory distress. She has no decreased breath sounds. She has no wheezes. She has no rhonchi. Right breast exhibits no inverted nipple, no mass, no nipple discharge and no tenderness (no axillary adenopathy). Left breast exhibits no inverted nipple, no mass, no nipple discharge and no tenderness (no axilarry adenopathy).  Abdominal: Soft. Bowel sounds are normal. There is no tenderness.  Musculoskeletal: She exhibits no edema or tenderness.  Lymphadenopathy:    She has no cervical adenopathy.  Neurological: She is alert and oriented to person, place, and time.  Skin: Skin is warm. No rash noted.  Psychiatric: She has a normal mood and affect. Her behavior is normal.    BP 130/78 mmHg  Pulse 83  Temp(Src) 97.8 F (36.6 C) (Oral)  Ht 5\' 7"  (1.702 m)  Wt 162 lb 8 oz (73.71 kg)  BMI 25.45 kg/m2  SpO2 95%  LMP 06/26/1985 Wt Readings from Last 3 Encounters:  07/12/14 162 lb 8 oz (73.71 kg)  03/08/14 155 lb 8 oz (70.534 kg)  10/05/13 160 lb 4 oz (72.689 kg)     Lab Results  Component Value Date   WBC 5.7 07/12/2014   HGB 13.8 07/12/2014   HCT 40.9 07/12/2014   PLT 240.0 07/12/2014   GLUCOSE 92 07/12/2014    CHOL 204* 07/12/2014   TRIG 91.0 07/12/2014   HDL 75.40 07/12/2014   LDLDIRECT 120.4 07/08/2013   LDLCALC 110* 07/12/2014   ALT 15 07/12/2014   AST 18 07/12/2014   NA 140 07/12/2014   K 4.5 07/12/2014   CL 105 07/12/2014   CREATININE 0.63 07/12/2014   BUN 16 07/12/2014   CO2 30 07/12/2014   TSH 1.58 07/12/2014       Assessment & Plan:   Problem List Items Addressed This Visit    Diverticulosis    Colonoscopy 08/31/12.  Bowels stable.  Recommend f/u colonoscopy in 10 years.        Health care maintenance    Physical today.        Hypercholesterolemia - Primary    Low cholesterol diet and exercise.  Follow lipid panel.        Relevant Orders   Comprehensive metabolic panel (Completed)   Lipid panel (Completed)   Lumbago    See above.  Stable.        Spinal stenosis    Has chronic back pain.  Recent flare.  Better now.  Saw chiropractor.  Follow.  Desires no further intervention.        Relevant Orders   CBC with Differential/Platelet (Completed)    Other Visit Diagnoses    Other fatigue        Relevant Orders    TSH (Completed)    Need for pneumococcal vaccine        Relevant Orders    Pneumococcal polysaccharide vaccine 23-valent greater than or equal to 2yo subcutaneous/IM (Completed)      I spent 25 minutes with the patient and more than 50% of the time was spent in consultation regarding the above.     Einar Pheasant, MD

## 2014-07-12 NOTE — Progress Notes (Signed)
Pre visit review using our clinic review tool, if applicable. No additional management support is needed unless otherwise documented below in the visit note. 

## 2014-07-13 ENCOUNTER — Encounter: Payer: Self-pay | Admitting: Internal Medicine

## 2014-07-13 ENCOUNTER — Encounter: Payer: Self-pay | Admitting: *Deleted

## 2014-07-13 DIAGNOSIS — M545 Low back pain, unspecified: Secondary | ICD-10-CM | POA: Insufficient documentation

## 2014-07-13 DIAGNOSIS — Z0000 Encounter for general adult medical examination without abnormal findings: Secondary | ICD-10-CM

## 2014-07-13 HISTORY — DX: Encounter for general adult medical examination without abnormal findings: Z00.00

## 2014-07-13 HISTORY — DX: Low back pain, unspecified: M54.50

## 2014-07-13 NOTE — Assessment & Plan Note (Signed)
See above.  Stable.

## 2014-07-13 NOTE — Assessment & Plan Note (Signed)
Has chronic back pain.  Recent flare.  Better now.  Saw chiropractor.  Follow.  Desires no further intervention.

## 2014-07-13 NOTE — Assessment & Plan Note (Signed)
Colonoscopy 08/31/12.  Bowels stable.  Recommend f/u colonoscopy in 10 years.

## 2014-07-13 NOTE — Assessment & Plan Note (Signed)
Low cholesterol diet and exercise.  Follow lipid panel.   

## 2014-07-13 NOTE — Assessment & Plan Note (Signed)
Physical today.

## 2014-07-14 DIAGNOSIS — M955 Acquired deformity of pelvis: Secondary | ICD-10-CM | POA: Diagnosis not present

## 2014-07-14 DIAGNOSIS — M9905 Segmental and somatic dysfunction of pelvic region: Secondary | ICD-10-CM | POA: Diagnosis not present

## 2014-07-14 DIAGNOSIS — M9903 Segmental and somatic dysfunction of lumbar region: Secondary | ICD-10-CM | POA: Diagnosis not present

## 2014-07-14 DIAGNOSIS — M5136 Other intervertebral disc degeneration, lumbar region: Secondary | ICD-10-CM | POA: Diagnosis not present

## 2014-07-19 DIAGNOSIS — M5136 Other intervertebral disc degeneration, lumbar region: Secondary | ICD-10-CM | POA: Diagnosis not present

## 2014-07-19 DIAGNOSIS — M9903 Segmental and somatic dysfunction of lumbar region: Secondary | ICD-10-CM | POA: Diagnosis not present

## 2014-07-19 DIAGNOSIS — M955 Acquired deformity of pelvis: Secondary | ICD-10-CM | POA: Diagnosis not present

## 2014-07-19 DIAGNOSIS — M9905 Segmental and somatic dysfunction of pelvic region: Secondary | ICD-10-CM | POA: Diagnosis not present

## 2014-07-26 DIAGNOSIS — M9905 Segmental and somatic dysfunction of pelvic region: Secondary | ICD-10-CM | POA: Diagnosis not present

## 2014-07-26 DIAGNOSIS — M5136 Other intervertebral disc degeneration, lumbar region: Secondary | ICD-10-CM | POA: Diagnosis not present

## 2014-07-26 DIAGNOSIS — M9903 Segmental and somatic dysfunction of lumbar region: Secondary | ICD-10-CM | POA: Diagnosis not present

## 2014-07-26 DIAGNOSIS — M955 Acquired deformity of pelvis: Secondary | ICD-10-CM | POA: Diagnosis not present

## 2014-08-02 DIAGNOSIS — M955 Acquired deformity of pelvis: Secondary | ICD-10-CM | POA: Diagnosis not present

## 2014-08-02 DIAGNOSIS — M9903 Segmental and somatic dysfunction of lumbar region: Secondary | ICD-10-CM | POA: Diagnosis not present

## 2014-08-02 DIAGNOSIS — M9905 Segmental and somatic dysfunction of pelvic region: Secondary | ICD-10-CM | POA: Diagnosis not present

## 2014-08-02 DIAGNOSIS — M5136 Other intervertebral disc degeneration, lumbar region: Secondary | ICD-10-CM | POA: Diagnosis not present

## 2014-08-09 DIAGNOSIS — M9903 Segmental and somatic dysfunction of lumbar region: Secondary | ICD-10-CM | POA: Diagnosis not present

## 2014-08-09 DIAGNOSIS — M955 Acquired deformity of pelvis: Secondary | ICD-10-CM | POA: Diagnosis not present

## 2014-08-09 DIAGNOSIS — M5136 Other intervertebral disc degeneration, lumbar region: Secondary | ICD-10-CM | POA: Diagnosis not present

## 2014-08-09 DIAGNOSIS — M9905 Segmental and somatic dysfunction of pelvic region: Secondary | ICD-10-CM | POA: Diagnosis not present

## 2014-08-16 DIAGNOSIS — M5136 Other intervertebral disc degeneration, lumbar region: Secondary | ICD-10-CM | POA: Diagnosis not present

## 2014-08-16 DIAGNOSIS — M9903 Segmental and somatic dysfunction of lumbar region: Secondary | ICD-10-CM | POA: Diagnosis not present

## 2014-08-16 DIAGNOSIS — M955 Acquired deformity of pelvis: Secondary | ICD-10-CM | POA: Diagnosis not present

## 2014-08-16 DIAGNOSIS — M9905 Segmental and somatic dysfunction of pelvic region: Secondary | ICD-10-CM | POA: Diagnosis not present

## 2014-08-22 DIAGNOSIS — B079 Viral wart, unspecified: Secondary | ICD-10-CM | POA: Diagnosis not present

## 2014-08-22 DIAGNOSIS — M5136 Other intervertebral disc degeneration, lumbar region: Secondary | ICD-10-CM | POA: Diagnosis not present

## 2014-08-22 DIAGNOSIS — M9905 Segmental and somatic dysfunction of pelvic region: Secondary | ICD-10-CM | POA: Diagnosis not present

## 2014-08-22 DIAGNOSIS — M9903 Segmental and somatic dysfunction of lumbar region: Secondary | ICD-10-CM | POA: Diagnosis not present

## 2014-08-22 DIAGNOSIS — M955 Acquired deformity of pelvis: Secondary | ICD-10-CM | POA: Diagnosis not present

## 2014-09-07 DIAGNOSIS — M5136 Other intervertebral disc degeneration, lumbar region: Secondary | ICD-10-CM | POA: Diagnosis not present

## 2014-09-07 DIAGNOSIS — M9903 Segmental and somatic dysfunction of lumbar region: Secondary | ICD-10-CM | POA: Diagnosis not present

## 2014-09-07 DIAGNOSIS — M9905 Segmental and somatic dysfunction of pelvic region: Secondary | ICD-10-CM | POA: Diagnosis not present

## 2014-09-07 DIAGNOSIS — M955 Acquired deformity of pelvis: Secondary | ICD-10-CM | POA: Diagnosis not present

## 2014-09-21 DIAGNOSIS — M9903 Segmental and somatic dysfunction of lumbar region: Secondary | ICD-10-CM | POA: Diagnosis not present

## 2014-09-21 DIAGNOSIS — M5136 Other intervertebral disc degeneration, lumbar region: Secondary | ICD-10-CM | POA: Diagnosis not present

## 2014-09-21 DIAGNOSIS — M9905 Segmental and somatic dysfunction of pelvic region: Secondary | ICD-10-CM | POA: Diagnosis not present

## 2014-09-21 DIAGNOSIS — M955 Acquired deformity of pelvis: Secondary | ICD-10-CM | POA: Diagnosis not present

## 2014-09-26 DIAGNOSIS — B079 Viral wart, unspecified: Secondary | ICD-10-CM | POA: Diagnosis not present

## 2014-10-05 DIAGNOSIS — M955 Acquired deformity of pelvis: Secondary | ICD-10-CM | POA: Diagnosis not present

## 2014-10-05 DIAGNOSIS — M5136 Other intervertebral disc degeneration, lumbar region: Secondary | ICD-10-CM | POA: Diagnosis not present

## 2014-10-05 DIAGNOSIS — M9905 Segmental and somatic dysfunction of pelvic region: Secondary | ICD-10-CM | POA: Diagnosis not present

## 2014-10-05 DIAGNOSIS — M9903 Segmental and somatic dysfunction of lumbar region: Secondary | ICD-10-CM | POA: Diagnosis not present

## 2014-10-11 DIAGNOSIS — H43822 Vitreomacular adhesion, left eye: Secondary | ICD-10-CM | POA: Diagnosis not present

## 2014-10-14 DIAGNOSIS — H43822 Vitreomacular adhesion, left eye: Secondary | ICD-10-CM | POA: Diagnosis not present

## 2014-10-14 DIAGNOSIS — H35342 Macular cyst, hole, or pseudohole, left eye: Secondary | ICD-10-CM | POA: Diagnosis not present

## 2014-10-26 DIAGNOSIS — M5136 Other intervertebral disc degeneration, lumbar region: Secondary | ICD-10-CM | POA: Diagnosis not present

## 2014-10-26 DIAGNOSIS — M9905 Segmental and somatic dysfunction of pelvic region: Secondary | ICD-10-CM | POA: Diagnosis not present

## 2014-10-26 DIAGNOSIS — M955 Acquired deformity of pelvis: Secondary | ICD-10-CM | POA: Diagnosis not present

## 2014-10-26 DIAGNOSIS — M9903 Segmental and somatic dysfunction of lumbar region: Secondary | ICD-10-CM | POA: Diagnosis not present

## 2014-11-23 DIAGNOSIS — M5136 Other intervertebral disc degeneration, lumbar region: Secondary | ICD-10-CM | POA: Diagnosis not present

## 2014-11-23 DIAGNOSIS — M9903 Segmental and somatic dysfunction of lumbar region: Secondary | ICD-10-CM | POA: Diagnosis not present

## 2014-11-23 DIAGNOSIS — M955 Acquired deformity of pelvis: Secondary | ICD-10-CM | POA: Diagnosis not present

## 2014-11-23 DIAGNOSIS — M9905 Segmental and somatic dysfunction of pelvic region: Secondary | ICD-10-CM | POA: Diagnosis not present

## 2014-11-25 DIAGNOSIS — H35342 Macular cyst, hole, or pseudohole, left eye: Secondary | ICD-10-CM | POA: Diagnosis not present

## 2014-12-12 DIAGNOSIS — L821 Other seborrheic keratosis: Secondary | ICD-10-CM | POA: Diagnosis not present

## 2014-12-20 DIAGNOSIS — M955 Acquired deformity of pelvis: Secondary | ICD-10-CM | POA: Diagnosis not present

## 2014-12-20 DIAGNOSIS — M5136 Other intervertebral disc degeneration, lumbar region: Secondary | ICD-10-CM | POA: Diagnosis not present

## 2014-12-20 DIAGNOSIS — M9905 Segmental and somatic dysfunction of pelvic region: Secondary | ICD-10-CM | POA: Diagnosis not present

## 2014-12-20 DIAGNOSIS — M9903 Segmental and somatic dysfunction of lumbar region: Secondary | ICD-10-CM | POA: Diagnosis not present

## 2015-01-10 ENCOUNTER — Encounter: Payer: Self-pay | Admitting: Internal Medicine

## 2015-01-10 ENCOUNTER — Ambulatory Visit (INDEPENDENT_AMBULATORY_CARE_PROVIDER_SITE_OTHER): Payer: Medicare Other | Admitting: Internal Medicine

## 2015-01-10 VITALS — BP 110/70 | HR 75 | Temp 97.9°F | Ht 67.0 in | Wt 158.0 lb

## 2015-01-10 DIAGNOSIS — K5730 Diverticulosis of large intestine without perforation or abscess without bleeding: Secondary | ICD-10-CM

## 2015-01-10 DIAGNOSIS — M4807 Spinal stenosis, lumbosacral region: Secondary | ICD-10-CM | POA: Diagnosis not present

## 2015-01-10 DIAGNOSIS — Z1239 Encounter for other screening for malignant neoplasm of breast: Secondary | ICD-10-CM

## 2015-01-10 DIAGNOSIS — E7800 Pure hypercholesterolemia, unspecified: Secondary | ICD-10-CM

## 2015-01-10 DIAGNOSIS — Z0000 Encounter for general adult medical examination without abnormal findings: Secondary | ICD-10-CM

## 2015-01-10 NOTE — Assessment & Plan Note (Signed)
Cholesterol improved on last check.  Low cholesterol diet and exercise.  Follow lipid panel.   Lab Results  Component Value Date   CHOL 204* 07/12/2014   HDL 75.40 07/12/2014   LDLCALC 110* 07/12/2014   LDLDIRECT 120.4 07/08/2013   TRIG 91.0 07/12/2014   CHOLHDL 3 07/12/2014

## 2015-01-10 NOTE — Assessment & Plan Note (Signed)
Back pain stable.  Seeing chiropractor.  This has helped.  Taking ginger (per pt) has helped.  Follow.

## 2015-01-10 NOTE — Assessment & Plan Note (Signed)
Colonoscopy 08/31/12 as outlined in overview.  Recommended f/u colonoscopy in 10 years.

## 2015-01-10 NOTE — Assessment & Plan Note (Signed)
Physical 07/12/14.  Colonoscopy 08/31/12.  Recommended f/u colonoscopy in 10 years.  Normal bone density 04/06/10.  Discussed with her today regarding f/u bone density.  She declines.  Mammogram 03/31/14 - Birads I.  Scheduled for f/u mammogram at the beginning of November.

## 2015-01-10 NOTE — Progress Notes (Signed)
Patient ID: Deborah Baxter, female   DOB: 1933-12-09, 81 y.o.   MRN: [id number]   Subjective:    Patient ID: Deborah Baxter, female    DOB: 02/27/1934, 81 y.o.   MRN: [id number]  HPI  Patient here for a scheduled follow up. Stays active.  No cardiac symptoms with increased activity or exertion.  No sob.  Eating and drinking well.  She has tried to lose some weight.  Has lost some since her last visit.  Bowels doing well.  Back pain stable.  Actually feels some better since putting ginger in her tea and since she has been seeing the chiropractor.     Past Medical History  Diagnosis Date  . Hypercholesterolemia   . Spinal stenosis     chronic back and leg pain  . Hx of degenerative disc disease   . Osteoarthritis     Outpatient Encounter Prescriptions as of 01/10/2015  Medication Sig  . acetaminophen (TYLENOL) 650 MG CR tablet Take 650 mg by mouth every 8 (eight) hours as needed. 2 tabs daily  . Calcium Carbonate-Vitamin D (CALCIUM 500 + D PO) Take by mouth 2 (two) times daily. 1 tab by mouth bid  . ibuprofen (ADVIL,MOTRIN) 200 MG tablet Take 200 mg by mouth every 6 (six) hours as needed for pain.  . Multiple Vitamin (MULTIVITAMIN) tablet Take 1 tablet by mouth daily.  . Polyethylene Glycol 3350 (MIRALAX PO) Take by mouth. Every 3 days   No facility-administered encounter medications on file as of 01/10/2015.    Review of Systems  Constitutional: Negative for appetite change.       Has adjusted her diet.  Lost weight.    HENT: Negative for congestion and sinus pressure.   Respiratory: Negative for cough, chest tightness and shortness of breath.   Cardiovascular: Negative for chest pain, palpitations and leg swelling.  Gastrointestinal: Negative for nausea, vomiting, abdominal pain and diarrhea.  Musculoskeletal:       Back pain stable - better.    Skin: Negative for color change and rash.  Neurological: Negative for dizziness, light-headedness and headaches.  Psychiatric/Behavioral:  Negative for dysphoric mood, decreased concentration and agitation.       Objective:     Blood pressure recheck:  112/76  Physical Exam  Constitutional: She appears well-developed and well-nourished. No distress.  HENT:  Nose: Nose normal.  Mouth/Throat: Oropharynx is clear and moist.  Neck: Neck supple. No thyromegaly present.  Cardiovascular: Normal rate and regular rhythm.   Pulmonary/Chest: Breath sounds normal. No respiratory distress. She has no wheezes.  Abdominal: Soft. Bowel sounds are normal. There is no tenderness.  Musculoskeletal: She exhibits no edema or tenderness.  Lymphadenopathy:    She has no cervical adenopathy.  Skin: No rash noted. No erythema.  Psychiatric: She has a normal mood and affect. Her behavior is normal.    BP 110/70 mmHg  Pulse 75  Temp(Src) 97.9 F (36.6 C) (Oral)  Ht 5\' 7"  (1.702 m)  Wt 158 lb (71.668 kg)  BMI 24.74 kg/m2  SpO2 96%  LMP 06/26/1985 Wt Readings from Last 3 Encounters:  01/10/15 158 lb (71.668 kg)  07/12/14 162 lb 8 oz (73.71 kg)  03/08/14 155 lb 8 oz (70.534 kg)     Lab Results  Component Value Date   WBC 5.7 07/12/2014   HGB 13.8 07/12/2014   HCT 40.9 07/12/2014   PLT 240.0 07/12/2014   GLUCOSE 92 07/12/2014   CHOL 204* 07/12/2014   TRIG 91.0 07/12/2014  HDL 75.40 07/12/2014   LDLDIRECT 120.4 07/08/2013   LDLCALC 110* 07/12/2014   ALT 15 07/12/2014   AST 18 07/12/2014   NA 140 07/12/2014   K 4.5 07/12/2014   CL 105 07/12/2014   CREATININE 0.63 07/12/2014   BUN 16 07/12/2014   CO2 30 07/12/2014   TSH 1.58 07/12/2014       Assessment & Plan:   Problem List Items Addressed This Visit    Diverticulosis    Colonoscopy 08/31/12 as outlined in overview.  Recommended f/u colonoscopy in 10 years.        Health care maintenance    Physical 07/12/14.  Colonoscopy 08/31/12.  Recommended f/u colonoscopy in 10 years.  Normal bone density 04/06/10.  Discussed with her today regarding f/u bone density.  She  declines.  Mammogram 03/31/14 - Birads I.  Scheduled for f/u mammogram at the beginning of November.       Hypercholesterolemia    Cholesterol improved on last check.  Low cholesterol diet and exercise.  Follow lipid panel.   Lab Results  Component Value Date   CHOL 204* 07/12/2014   HDL 75.40 07/12/2014   LDLCALC 110* 07/12/2014   LDLDIRECT 120.4 07/08/2013   TRIG 91.0 07/12/2014   CHOLHDL 3 07/12/2014         Spinal stenosis    Back pain stable.  Seeing chiropractor.  This has helped.  Taking ginger (per pt) has helped.  Follow.         Other Visit Diagnoses    Screening breast examination    -  Primary    Relevant Orders    MM DIGITAL SCREENING BILATERAL        Einar Pheasant, MD

## 2015-01-10 NOTE — Progress Notes (Signed)
Pre visit review using our clinic review tool, if applicable. No additional management support is needed unless otherwise documented below in the visit note. 

## 2015-01-18 DIAGNOSIS — M9903 Segmental and somatic dysfunction of lumbar region: Secondary | ICD-10-CM | POA: Diagnosis not present

## 2015-01-18 DIAGNOSIS — M955 Acquired deformity of pelvis: Secondary | ICD-10-CM | POA: Diagnosis not present

## 2015-01-18 DIAGNOSIS — M9905 Segmental and somatic dysfunction of pelvic region: Secondary | ICD-10-CM | POA: Diagnosis not present

## 2015-01-18 DIAGNOSIS — M5136 Other intervertebral disc degeneration, lumbar region: Secondary | ICD-10-CM | POA: Diagnosis not present

## 2015-01-27 DIAGNOSIS — H35342 Macular cyst, hole, or pseudohole, left eye: Secondary | ICD-10-CM | POA: Diagnosis not present

## 2015-01-27 DIAGNOSIS — H43822 Vitreomacular adhesion, left eye: Secondary | ICD-10-CM | POA: Diagnosis not present

## 2015-02-15 DIAGNOSIS — M5136 Other intervertebral disc degeneration, lumbar region: Secondary | ICD-10-CM | POA: Diagnosis not present

## 2015-02-15 DIAGNOSIS — M9903 Segmental and somatic dysfunction of lumbar region: Secondary | ICD-10-CM | POA: Diagnosis not present

## 2015-02-15 DIAGNOSIS — M955 Acquired deformity of pelvis: Secondary | ICD-10-CM | POA: Diagnosis not present

## 2015-02-15 DIAGNOSIS — M9905 Segmental and somatic dysfunction of pelvic region: Secondary | ICD-10-CM | POA: Diagnosis not present

## 2015-03-01 ENCOUNTER — Encounter: Payer: Self-pay | Admitting: *Deleted

## 2015-03-08 ENCOUNTER — Ambulatory Visit
Admission: RE | Admit: 2015-03-08 | Discharge: 2015-03-08 | Disposition: A | Discharge status: Home / Self Care | Payer: Medicare Other | Source: Ambulatory Visit | Attending: Ophthalmology | Admitting: Ophthalmology

## 2015-03-08 ENCOUNTER — Ambulatory Visit: Payer: Medicare Other | Admitting: Anesthesiology

## 2015-03-08 ENCOUNTER — Encounter
Admission: RE | Disposition: A | Discharge status: Home / Self Care | Payer: Self-pay | Source: Ambulatory Visit | Attending: Ophthalmology

## 2015-03-08 ENCOUNTER — Encounter: Payer: Self-pay | Admitting: *Deleted

## 2015-03-08 DIAGNOSIS — Z791 Long term (current) use of non-steroidal anti-inflammatories (NSAID): Secondary | ICD-10-CM | POA: Diagnosis not present

## 2015-03-08 DIAGNOSIS — M1990 Unspecified osteoarthritis, unspecified site: Secondary | ICD-10-CM | POA: Diagnosis not present

## 2015-03-08 DIAGNOSIS — H35342 Macular cyst, hole, or pseudohole, left eye: Secondary | ICD-10-CM | POA: Insufficient documentation

## 2015-03-08 DIAGNOSIS — Z9889 Other specified postprocedural states: Secondary | ICD-10-CM | POA: Insufficient documentation

## 2015-03-08 DIAGNOSIS — Z79899 Other long term (current) drug therapy: Secondary | ICD-10-CM | POA: Insufficient documentation

## 2015-03-08 DIAGNOSIS — Z87891 Personal history of nicotine dependence: Secondary | ICD-10-CM | POA: Diagnosis not present

## 2015-03-08 DIAGNOSIS — Z9049 Acquired absence of other specified parts of digestive tract: Secondary | ICD-10-CM | POA: Insufficient documentation

## 2015-03-08 HISTORY — PX: GAS/FLUID EXCHANGE: SHX5334

## 2015-03-08 HISTORY — PX: VITRECTOMY 25 GAUGE WITH SCLERAL BUCKLE: SHX6183

## 2015-03-08 SURGERY — VITRECTOMY, USING 25-GAUGE INSTRUMENTS, WITH SCLERAL BUCKLING
Anesthesia: Monitor Anesthesia Care | Laterality: Left | Wound class: Clean

## 2015-03-08 MED ORDER — BSS PLUS IO SOLN: Freq: Once | INTRAOCULAR | Status: DC

## 2015-03-08 MED ORDER — CEFUROXIME OPHTHALMIC INJECTION 1 MG/0.1 ML
INJECTION | OPHTHALMIC | Status: DC | PRN
  Administered 2015-03-08: 0.1 mL via INTRACAMERAL

## 2015-03-08 MED ORDER — NEOMYCIN-POLYMYXIN-DEXAMETH 0.1 % OP OINT
TOPICAL_OINTMENT | OPHTHALMIC | Status: DC | PRN
  Administered 2015-03-08: 1

## 2015-03-08 MED ORDER — SODIUM CHLORIDE 0.9 % IV SOLN
INTRAVENOUS | Status: DC
  Administered 2015-03-08: 08:00:00 via INTRAVENOUS

## 2015-03-08 MED ORDER — CYCLOPENTOLATE HCL 2 % OP SOLN
1.0000 [drp] | OPHTHALMIC | Status: AC | PRN
  Administered 2015-03-08 (×3): 1 [drp] via OPHTHALMIC

## 2015-03-08 MED ORDER — ONDANSETRON HCL 4 MG/2ML IJ SOLN: 4.0000 mg | Freq: Once | INTRAMUSCULAR | Status: DC | PRN

## 2015-03-08 MED ORDER — PHENYLEPHRINE HCL 10 % OP SOLN
1.0000 [drp] | OPHTHALMIC | Status: AC | PRN
  Administered 2015-03-08 (×3): 1 [drp] via OPHTHALMIC

## 2015-03-08 MED ORDER — ALFENTANIL 500 MCG/ML IJ INJ
INJECTION | INTRAMUSCULAR | Status: DC | PRN
  Administered 2015-03-08: 500 ug via INTRAVENOUS

## 2015-03-08 MED ORDER — LIDOCAINE HCL (PF) 4 % IJ SOLN
INTRAMUSCULAR | Status: DC | PRN
  Administered 2015-03-08: 5 mL via OPHTHALMIC

## 2015-03-08 MED ORDER — FENTANYL CITRATE (PF) 100 MCG/2ML IJ SOLN: 25.0000 ug | INTRAMUSCULAR | Status: DC | PRN

## 2015-03-08 MED ORDER — ATROPINE SULFATE 1 % OP SOLN
OPHTHALMIC | Status: DC | PRN
  Administered 2015-03-08: 2 [drp] via OPHTHALMIC

## 2015-03-08 MED ORDER — MIDAZOLAM HCL 2 MG/2ML IJ SOLN
INTRAMUSCULAR | Status: DC | PRN
  Administered 2015-03-08 (×4): 0.5 mg via INTRAVENOUS

## 2015-03-08 MED ORDER — HYPROMELLOSE 0.3 % OP GEL
OPHTHALMIC | Status: DC | PRN
  Administered 2015-03-08: 1 via OPHTHALMIC

## 2015-03-08 MED ORDER — INDOCYANINE GREEN 25 MG IV SOLR
25.0000 mg | Freq: Once | INTRAVENOUS | Status: AC
  Administered 2015-03-08: 25 mg via OPHTHALMIC
  Filled 2015-03-08: qty 25

## 2015-03-08 MED ORDER — DEXAMETHASONE SODIUM PHOSPHATE 10 MG/ML IJ SOLN
INTRAMUSCULAR | Status: DC | PRN
  Administered 2015-03-08: 5 mg

## 2015-03-08 MED ORDER — DEXTROSE 5 % IV SOLN
INTRAVENOUS | Status: DC | PRN
  Administered 2015-03-08: 5 mL

## 2015-03-08 MED ORDER — TRIAMCINOLONE ACETONIDE INTRAVITREAL INJECTION 4 MG/0.1 ML
INTRAOCULAR | Status: DC | PRN
  Administered 2015-03-08: 4 mg via INTRAOCULAR

## 2015-03-08 SURGICAL SUPPLY — 36 items
APPLICATOR COT TIP 3IN STERILE (MISCELLANEOUS) ×8
APPLICATOR COT TIP 3IN STRL (MISCELLANEOUS) ×4 IMPLANT
CANNULA SOFT TIP 25G (CANNULA) IMPLANT
CORD BIP STRL DISP 12FT (MISCELLANEOUS) IMPLANT
COVER LIGHT HANDLE STERIS (MISCELLANEOUS) IMPLANT
CUP MEDICINE 2OZ PLAST GRAD ST (MISCELLANEOUS) ×3 IMPLANT
ERASER HMR WETFIELD 18G (MISCELLANEOUS) ×3 IMPLANT
ERASER HMR WETFIELD 25G (MISCELLANEOUS) IMPLANT
FILTER MILLEX .045 (MISCELLANEOUS) ×1 IMPLANT
FLTR MILLEX .045 (MISCELLANEOUS) ×3
FORCEPS GRIESH GRASP 25G (INSTRUMENTS) IMPLANT
FORCEPS GRIESH ILM PLUS 25G (INSTRUMENTS) IMPLANT
GLOVE BIO SURGEON STRL SZ8 (GLOVE) ×3 IMPLANT
GLOVE SURG LX 6.5 MICRO (GLOVE) ×4
GLOVE SURG LX STRL 6.5 MICRO (GLOVE) ×2 IMPLANT
GOWN STRL REUS W/ TWL LRG LVL3 (GOWN DISPOSABLE) ×2 IMPLANT
GOWN STRL REUS W/TWL LRG LVL3 (GOWN DISPOSABLE) ×4
IV D5W 50ML SINGLE PACK (IV SOLUTION) ×3 IMPLANT
IV SET STOPCOCK EXT 40 2IN (SET/KITS/TRAYS/PACK) ×3 IMPLANT
LENS VITRECTOMY FLAT DISP (MISCELLANEOUS) ×3 IMPLANT
NDL RETROBULBAR .5 NSTRL (NEEDLE) ×3 IMPLANT
PACK EYE AFTER SURG (MISCELLANEOUS) ×3 IMPLANT
PACK VITRECTOMY (MISCELLANEOUS) ×3 IMPLANT
PACK VITRECTOMY CASSETTE 25GA (MISCELLANEOUS) ×3 IMPLANT
PROBE DIRECTIONAL LASER (MISCELLANEOUS) IMPLANT
PROBE LASER ILLUM FLEX CVD 25G (OPHTHALMIC) IMPLANT
SOL PREP PVP 2OZ (MISCELLANEOUS) ×3
SOLUTION PREP PVP 2OZ (MISCELLANEOUS) ×1 IMPLANT
STRAP SAFETY BODY (MISCELLANEOUS) ×3 IMPLANT
STRIP SILICONE 42 (MISCELLANEOUS) IMPLANT
STRIP SILICONE 70 (MISCELLANEOUS) IMPLANT
SUT ETHILON 5.0 S-24 (SUTURE) IMPLANT
SUT SILK 4 0 (SUTURE)
SUT SILK 4-0 18XBRD TIE 12 (SUTURE) IMPLANT
SUT VICRYL 7 0 TG140 8 (SUTURE) IMPLANT
SYR 50ML LL SCALE MARK (SYRINGE) ×3 IMPLANT

## 2015-03-08 NOTE — Anesthesia Procedure Notes (Signed)
Procedure Name: MAC Performed by: COOK-MARTIN, Johnavon Mcclafferty Pre-anesthesia Checklist: Patient identified, Emergency Drugs available, Suction available, Patient being monitored and Timeout performed Patient Re-evaluated:Patient Re-evaluated prior to inductionOxygen Delivery Method: Nasal cannula Preoxygenation: Pre-oxygenation with 100% oxygen Intubation Type: IV induction Placement Confirmation: positive ETCO2 and CO2 detector       

## 2015-03-08 NOTE — Transfer of Care (Signed)
Immediate Anesthesia Transfer of Care Note  Patient: Deborah Baxter  Procedure(s) Performed: Procedure(s) with comments: VITRECTOMY 25 GAUGE, membrane peel, 26 %  SF6 gas exchange (Left) - casette lot # [id number] H GAS/FLUID EXCHANGE (Left)  Patient Location: PACU  Anesthesia Type:MAC  Level of Consciousness: awake, alert  and oriented  Airway & Oxygen Therapy: Patient Spontanous Breathing  Post-op Assessment: Report given to RN and Post -op Vital signs reviewed and stable  Post vital signs: Reviewed and stable  Last Vitals:  Filed Vitals:   03/08/15 0732  BP: 158/76  Pulse: 76  Temp: 35.3 C  Resp: 16    Complications: No apparent anesthesia complications

## 2015-03-08 NOTE — H&P (Signed)
.  Previous H&P scanned in reviewed, patient examined, and no interval changes.  Please see scanned record for complete information.   

## 2015-03-08 NOTE — Anesthesia Postprocedure Evaluation (Signed)
  Anesthesia Post-op Note  Patient: Deborah Baxter  Procedure(s) Performed: Procedure(s) with comments: VITRECTOMY 25 GAUGE, membrane peel, 26 %  SF6 gas exchange (Left) - casette lot # [id number] H GAS/FLUID EXCHANGE (Left)  Anesthesia type:MAC  Patient location: PACU  Post pain: Pain level controlled  Post assessment: Post-op Vital signs reviewed, Patient's Cardiovascular Status Stable, Respiratory Function Stable, Patent Airway and No signs of Nausea or vomiting  Post vital signs: Reviewed and stable  Last Vitals:  Filed Vitals:   03/08/15 1115  BP: 146/68  Pulse: 66  Temp: 36.7 C  Resp: 16    Level of consciousness: awake, alert  and patient cooperative  Complications: No apparent anesthesia complications

## 2015-03-08 NOTE — Op Note (Signed)
Indications for Surgery:The patient was examined in clinic for a macular hole in the left eye. The risks, benefits, alternatives, and complications were discussed with the patient, and she elected to proceed with pars plana vitrectomy with membrane peeling for her macular hole.   PREOPERATIVE DIAGNOSIS:Macular hole, left eye.  POST OPERATIVE DIAGNOSIS:Macular hole, left eye.  OPERATION PERFORMED:25-gauge pars plana vitrectomy, triamcinolone, ICG, membrane peel, air-fluid exchange, and 26% SF6 left eye.  ANESTHESIA:MAC with retrobulbar block.  COMPLICATIONS:None.  BLOOD LOSS:Minimal.  DESCRIPTION OF PROCEDURE:On the day of surgery, the patient was greeted in the preoperative holding area. Any questions were answered. The left eye was marked. The patient was then brought into the operating room in the supine position and placed under monitored anesthesia care. Next, 5 ml of a retrobulbar block consisting of 4% xylocaine plain, 0.75% sensorcaine plain and hylenex 100u was injected. The left eye was then prepped and draped in the usual sterile fashion. A conjunctival cutdown was made and the remaining 64m of block was placed subtenons. A 25-gauge pars plana vitrectomy kit was used for the vitrectomy portion of the case. Core vitrectomy was performed, and kenalog was used to stain the posterior hyaloid.The posterior hyaloid was very tightly adherent to the retinal surface, and many attempts and techniques were used to elevate it. Posterior hyaloid detachment was created using the contact lens and ILM forceps.  Then, the peripheral vitreous was trimmed for 360 degrees, however the  posterior hyaloid detachment did not extend past the mid periphery 360 degrees. Attention was then drawn to the macula. The ICG was placed on the macular surface and then aspirated. A macular contact lens was used for visualization. A 25-gauge ILM forcep was then used to peel the ILM for an area of minimum one disc diameter around the macular hole, and to address any residual hyaloid that remained attached. Attention was then again drawn to the periphery. A wide angle viewing system was used with scleral depression for 360 degrees. There were no retinal tears or breaks noted. An air-fluid exchange was then performed and four times the vitreous volume of 26% SF6 was infused into the vitreous cavity. The trocars were then removed. All the wounds were felt to be airtight. Subconjunctival cefuroxime and dexamethasone was injected. The patient had an acceptable intraocular pressure by palpation. The patient was then patched and shielded with neo-poly-dex ointment and taken to the recovery area in stable condition.

## 2015-03-08 NOTE — Discharge Instructions (Signed)

## 2015-03-08 NOTE — Anesthesia Preprocedure Evaluation (Signed)
Anesthesia Evaluation  Patient identified by MRN, date of birth, ID band Patient awake    Reviewed: Allergy & Precautions, NPO status , Patient's Chart, lab work & pertinent test results, reviewed documented beta blocker date and time   Airway Mallampati: II  TM Distance: >3 FB     Dental  (+) Chipped   Pulmonary former smoker,           Cardiovascular      Neuro/Psych    GI/Hepatic   Endo/Other    Renal/GU      Musculoskeletal  (+) Arthritis ,   Abdominal   Peds  Hematology   Anesthesia Other Findings   Reproductive/Obstetrics                             Anesthesia Physical Anesthesia Plan  ASA: II  Anesthesia Plan: MAC   Post-op Pain Management:    Induction: Intravenous  Airway Management Planned:   Additional Equipment:   Intra-op Plan:   Post-operative Plan:   Informed Consent: I have reviewed the patients History and Physical, chart, labs and discussed the procedure including the risks, benefits and alternatives for the proposed anesthesia with the patient or authorized representative who has indicated his/her understanding and acceptance.     Plan Discussed with: CRNA  Anesthesia Plan Comments:         Anesthesia Quick Evaluation

## 2015-03-15 DIAGNOSIS — H35342 Macular cyst, hole, or pseudohole, left eye: Secondary | ICD-10-CM | POA: Diagnosis not present

## 2015-03-19 HISTORY — PX: EYE SURGERY: SHX253

## 2015-03-23 DIAGNOSIS — Z23 Encounter for immunization: Secondary | ICD-10-CM | POA: Diagnosis not present

## 2015-04-04 ENCOUNTER — Other Ambulatory Visit: Payer: Self-pay | Admitting: Internal Medicine

## 2015-04-04 ENCOUNTER — Ambulatory Visit
Admission: RE | Admit: 2015-04-04 | Discharge: 2015-04-04 | Disposition: A | Discharge status: Home / Self Care | Payer: Medicare Other | Source: Ambulatory Visit | Attending: Internal Medicine | Admitting: Internal Medicine

## 2015-04-04 DIAGNOSIS — M9903 Segmental and somatic dysfunction of lumbar region: Secondary | ICD-10-CM | POA: Diagnosis not present

## 2015-04-04 DIAGNOSIS — Z1231 Encounter for screening mammogram for malignant neoplasm of breast: Secondary | ICD-10-CM | POA: Insufficient documentation

## 2015-04-04 DIAGNOSIS — M9905 Segmental and somatic dysfunction of pelvic region: Secondary | ICD-10-CM | POA: Diagnosis not present

## 2015-04-04 DIAGNOSIS — Z1239 Encounter for other screening for malignant neoplasm of breast: Secondary | ICD-10-CM

## 2015-04-04 DIAGNOSIS — M955 Acquired deformity of pelvis: Secondary | ICD-10-CM | POA: Diagnosis not present

## 2015-04-04 DIAGNOSIS — M5136 Other intervertebral disc degeneration, lumbar region: Secondary | ICD-10-CM | POA: Diagnosis not present

## 2015-04-10 DIAGNOSIS — H35342 Macular cyst, hole, or pseudohole, left eye: Secondary | ICD-10-CM | POA: Diagnosis not present

## 2015-05-02 DIAGNOSIS — M5136 Other intervertebral disc degeneration, lumbar region: Secondary | ICD-10-CM | POA: Diagnosis not present

## 2015-05-02 DIAGNOSIS — M955 Acquired deformity of pelvis: Secondary | ICD-10-CM | POA: Diagnosis not present

## 2015-05-02 DIAGNOSIS — M9905 Segmental and somatic dysfunction of pelvic region: Secondary | ICD-10-CM | POA: Diagnosis not present

## 2015-05-02 DIAGNOSIS — M9903 Segmental and somatic dysfunction of lumbar region: Secondary | ICD-10-CM | POA: Diagnosis not present

## 2015-05-30 DIAGNOSIS — M955 Acquired deformity of pelvis: Secondary | ICD-10-CM | POA: Diagnosis not present

## 2015-05-30 DIAGNOSIS — M9903 Segmental and somatic dysfunction of lumbar region: Secondary | ICD-10-CM | POA: Diagnosis not present

## 2015-05-30 DIAGNOSIS — M9905 Segmental and somatic dysfunction of pelvic region: Secondary | ICD-10-CM | POA: Diagnosis not present

## 2015-05-30 DIAGNOSIS — M5136 Other intervertebral disc degeneration, lumbar region: Secondary | ICD-10-CM | POA: Diagnosis not present

## 2015-06-23 DIAGNOSIS — H35342 Macular cyst, hole, or pseudohole, left eye: Secondary | ICD-10-CM | POA: Diagnosis not present

## 2015-06-27 DIAGNOSIS — M9903 Segmental and somatic dysfunction of lumbar region: Secondary | ICD-10-CM | POA: Diagnosis not present

## 2015-06-27 DIAGNOSIS — M955 Acquired deformity of pelvis: Secondary | ICD-10-CM | POA: Diagnosis not present

## 2015-06-27 DIAGNOSIS — M5136 Other intervertebral disc degeneration, lumbar region: Secondary | ICD-10-CM | POA: Diagnosis not present

## 2015-06-27 DIAGNOSIS — M9905 Segmental and somatic dysfunction of pelvic region: Secondary | ICD-10-CM | POA: Diagnosis not present

## 2015-07-25 DIAGNOSIS — M9905 Segmental and somatic dysfunction of pelvic region: Secondary | ICD-10-CM | POA: Diagnosis not present

## 2015-07-25 DIAGNOSIS — M955 Acquired deformity of pelvis: Secondary | ICD-10-CM | POA: Diagnosis not present

## 2015-07-25 DIAGNOSIS — M5136 Other intervertebral disc degeneration, lumbar region: Secondary | ICD-10-CM | POA: Diagnosis not present

## 2015-07-25 DIAGNOSIS — M9903 Segmental and somatic dysfunction of lumbar region: Secondary | ICD-10-CM | POA: Diagnosis not present

## 2015-07-26 ENCOUNTER — Telehealth: Payer: Self-pay | Admitting: Internal Medicine

## 2015-07-26 NOTE — Telephone Encounter (Signed)
Left to call office to schedule AWV with Denisa/msn

## 2015-08-02 ENCOUNTER — Ambulatory Visit (INDEPENDENT_AMBULATORY_CARE_PROVIDER_SITE_OTHER): Payer: Medicare Other

## 2015-08-02 VITALS — BP 128/72 | HR 74 | Temp 98.1°F | Resp 14 | Ht 67.0 in | Wt 161.8 lb

## 2015-08-02 DIAGNOSIS — Z0000 Encounter for general adult medical examination without abnormal findings: Secondary | ICD-10-CM

## 2015-08-02 NOTE — Progress Notes (Signed)
Subjective:   Deborah Baxter is a 82 y.o. female who presents for an Initial Medicare Annual Wellness Visit.  Review of Systems    No ROS.  Medicare Wellness Visit.  Cardiac Risk Factors include: advanced age (>33men, >78 women)     Objective:    Today's Vitals   08/02/15 1025 08/02/15 1040  BP: 128/72   Pulse: 74   Temp: 98.1 F (36.7 C)   TempSrc: Oral   Resp: 14   Height: 5\' 7"  (1.702 m)   Weight: 161 lb 12.8 oz (73.392 kg)   SpO2: 97%   PainSc:  2     Current Medications (verified) Outpatient Encounter Prescriptions as of 08/02/2015  Medication Sig  . Calcium Carbonate-Vitamin D (CALCIUM 500 + D PO) Take by mouth 2 (two) times daily. 1 tab by mouth bid  . Multiple Vitamin (MULTIVITAMIN) tablet Take 1 tablet by mouth daily.  . Polyethylene Glycol 3350 (MIRALAX PO) Take by mouth. Every 3 days  . Tetrahydrozoline-Zn Sulfate (EYE DROPS RELIEF OP) Apply 1 drop to eye.  Marland Kitchen acetaminophen (TYLENOL) 650 MG CR tablet Take 650 mg by mouth every 8 (eight) hours as needed. Reported on 08/02/2015  . ibuprofen (ADVIL,MOTRIN) 200 MG tablet Take 200 mg by mouth every 6 (six) hours as needed for pain. Reported on 08/02/2015   No facility-administered encounter medications on file as of 08/02/2015.    Allergies (verified) Review of patient's allergies indicates no known allergies.   History: Past Medical History  Diagnosis Date  . Hypercholesterolemia   . Spinal stenosis     chronic back and leg pain  . Hx of degenerative disc disease   . Osteoarthritis    Past Surgical History  Procedure Laterality Date  . Appendectomy    . Tonsillectomy    . Ganglion cyst excision      right hand  . Btl  1976  . Bladder tack surgery  2000  . Tubal ligation    . Vitrectomy 25 gauge with scleral buckle Left 03/08/2015    Procedure: VITRECTOMY 25 GAUGE, membrane peel, 26 %  SF6 gas exchange;  Surgeon: Milus Height, MD;  Location: [facility] ORS;  Service: Ophthalmology;  Laterality: Left;  casette  lot # [id number]  . Gas/fluid exchange Left 03/08/2015    Procedure: GAS/FLUID EXCHANGE;  Surgeon: Milus Height, MD;  Location: [facility] ORS;  Service: Ophthalmology;  Laterality: Left;  Marland Kitchen Eye surgery  03/19/15   Family History  Problem Relation Age of Onset  . Heart disease Father   . Leukemia Father   . Diabetes Mother   . Heart disease Mother     myocardial infarction  . Breast cancer      aunt  . Colon cancer Neg Hx   . Breast cancer Sister     in her 7's  . Breast cancer Maternal Aunt    Social History   Occupational History  . Not on file.   Social History Main Topics  . Smoking status: Former Research scientist (life sciences)  . Smokeless tobacco: Never Used  . Alcohol Use: No  . Drug Use: No  . Sexual Activity: No    Tobacco Counseling Counseling given: Not Answered   Activities of Daily Living In your present state of health, do you have any difficulty performing the following activities: 08/02/2015  Hearing? N  Vision? N  Difficulty concentrating or making decisions? N  Walking or climbing stairs? N  Dressing or bathing? N  Doing errands, shopping? N  Preparing Food  and eating ? N  Using the Toilet? N  In the past six months, have you accidently leaked urine? Y  Do you have problems with loss of bowel control? N  Managing your Medications? N  Managing your Finances? N  Housekeeping or managing your Housekeeping? N    Immunizations and Health Maintenance Immunization History  Administered Date(s) Administered  . Influenza Split 02/15/2014  . Influenza-Unspecified 02/20/2015  . Pneumococcal Conjugate-13 07/08/2013  . Pneumococcal Polysaccharide-23 07/12/2014   Health Maintenance Due  Topic Date Due  . TETANUS/TDAP  01[birth year]22/1953  . ZOSTAVAX  01/22/1996    Patient Care Team: Einar Pheasant, MD as PCP - General (Internal Medicine)  Indicate any recent Medical Services you may have received from other than [facility] providers in the past year (date may be approximate).       Assessment:   This is a routine wellness examination for Deborah Baxter. The goal of the wellness visit is to assist the patient how to close the gaps in care and create a preventative care plan for the patient.   Taking Calcium as appropriate/Osteoporosis risk reviewed.  Medications reviewed; taking without issues or barriers.  Safety issues reviewed; smoke detectors in the home. No firearms in the home. Wears seatbelts when driving or riding with others. No violence in the home.  No identified risk were noted; The patient was oriented x 3; appropriate in dress and manner and no objective failures at ADL's or IADL's.   Patient Concerns:  Reoccurring itchy L inner ear; follow up appointment scheduled with PCP.  Hearing/Vision screen Hearing Screening Comments: Passes the whisper test Vision Screening Comments: Followed [facility], Dr. Oval Linsey Wears glasses L eye Detached retina  Visits every 3 months  Dietary issues and exercise activities discussed: Current Exercise Habits:: Home exercise routine (Physical therapy exercises), Type of exercise: stretching;calisthenics, Time (Minutes): 20, Frequency (Times/Week): 6, Weekly Exercise (Minutes/Week): 120, Intensity: Mild  Goals    . Healthy Lifestyle     Maintain home physical therapy exercises Stay hydrated! Drink plenty of fluids Choose low carb foods, lean proteins (chicken, [place], fish), fruits and vegetables      Depression Screen PHQ 2/9 Scores 08/02/2015 07/12/2014 07/08/2013  PHQ - 2 Score 0 0 0    Fall Risk Fall Risk  08/02/2015 07/12/2014 07/08/2013  Falls in the past year? No No No    Cognitive Function: MMSE - Mini Mental State Exam 08/02/2015  Orientation to time 5  Orientation to Place 5  Registration 3  Attention/ Calculation 5  Recall 3  Language- name 2 objects 2  Language- repeat 1  Language- follow 3 step command 3  Language- read & follow direction 1  Write a sentence 1  Copy design 1  Total score  30    Screening Tests Health Maintenance  Topic Date Due  . TETANUS/TDAP  01[birth year]22/1953  . ZOSTAVAX  01/22/1996  . INFLUENZA VACCINE  01/02/2016  . MAMMOGRAM  04/03/2016  . DEXA SCAN  Completed  . PNA vac Low Risk Adult  Completed      Plan:   End of life planning; Advance aging; Advanced directives discussed. Current copy of HCPOA/Living Will requested.  During the course of the visit, Magdalina was educated and counseled about the following appropriate screening and preventive services:   Vaccines to include Pneumoccal, Influenza, Hepatitis B, Td, Zostavax, HCV  Electrocardiogram  Cardiovascular disease screening  Colorectal cancer screening  Bone density screening  Diabetes screening  Glaucoma screening  Mammography/PAP  Nutrition counseling  Smoking cessation counseling  Patient Instructions (the written plan) were given to the patient.    Varney Biles, LPN   [id number]    Reviewed above.  Agree with plan.  Will f/u with me as scheduled.    Dr Nicki Reaper

## 2015-08-02 NOTE — Patient Instructions (Addendum)
Deborah Baxter , Thank you for taking time to come for your Medicare Wellness Visit. I appreciate your ongoing commitment to your health goals. Please review the following plan we discussed and let me know if I can assist you in the future.     This is a list of the screening recommended for you and due dates:  Health Maintenance  Topic Date Due  . Tetanus Vaccine  01[birth year]22/1953  . Shingles Vaccine  01/22/1996  . Flu Shot  01/02/2016  . Mammogram  04/03/2016  . DEXA scan (bone density measurement)  Completed  . Pneumonia vaccines  Completed    Health Maintenance, Female Adopting a healthy lifestyle and getting preventive care can go a long way to promote health and wellness. Talk with your health care provider about what schedule of regular examinations is right for you. This is a good chance for you to check in with your provider about disease prevention and staying healthy. In between checkups, there are plenty of things you can do on your own. Experts have done a lot of research about which lifestyle changes and preventive measures are most likely to keep you healthy. Ask your health care provider for more information. WEIGHT AND DIET  Eat a healthy diet  Be sure to include plenty of vegetables, fruits, low-fat dairy products, and lean protein.  Do not eat a lot of foods high in solid fats, added sugars, or salt.  Get regular exercise. This is one of the most important things you can do for your health.  Most adults should exercise for at least 150 minutes each week. The exercise should increase your heart rate and make you sweat (moderate-intensity exercise).  Most adults should also do strengthening exercises at least twice a week. This is in addition to the moderate-intensity exercise.  Maintain a healthy weight  Body mass index (BMI) is a measurement that can be used to identify possible weight problems. It estimates body fat based on height and weight. Your health care provider can  help determine your BMI and help you achieve or maintain a healthy weight.  For females 38 years of age and older:   A BMI below 18.5 is considered underweight.  A BMI of 18.5 to 24.9 is normal.  A BMI of 25 to 29.9 is considered overweight.  A BMI of 30 and above is considered obese.  Watch levels of cholesterol and blood lipids  You should start having your blood tested for lipids and cholesterol at 80 years of age, then have this test every 5 years.  You may need to have your cholesterol levels checked more often if:  Your lipid or cholesterol levels are high.  You are older than 80 years of age.  You are at high risk for heart disease.  CANCER SCREENING   Lung Cancer  Lung cancer screening is recommended for adults 18-53 years old who are at high risk for lung cancer because of a history of smoking.  A yearly low-dose CT scan of the lungs is recommended for people who:  Currently smoke.  Have quit within the past 15 years.  Have at least a 30-pack-year history of smoking. A pack year is smoking an average of one pack of cigarettes a day for 1 year.  Yearly screening should continue until it has been 15 years since you quit.  Yearly screening should stop if you develop a health problem that would prevent you from having lung cancer treatment.  Breast Cancer  Practice  breast self-awareness. This means understanding how your breasts normally appear and feel.  It also means doing regular breast self-exams. Let your health care provider know about any changes, no matter how small.  If you are in your 20s or 30s, you should have a clinical breast exam (CBE) by a health care provider every 1-3 years as part of a regular health exam.  If you are 48 or older, have a CBE every year. Also consider having a breast X-ray (mammogram) every year.  If you have a family history of breast cancer, talk to your health care provider about genetic screening.  If you are at high  risk for breast cancer, talk to your health care provider about having an MRI and a mammogram every year.  Breast cancer gene (BRCA) assessment is recommended for women who have family members with BRCA-related cancers. BRCA-related cancers include:  Breast.  Ovarian.  Tubal.  Peritoneal cancers.  Results of the assessment will determine the need for genetic counseling and BRCA1 and BRCA2 testing. Cervical Cancer Your health care provider may recommend that you be screened regularly for cancer of the pelvic organs (ovaries, uterus, and vagina). This screening involves a pelvic examination, including checking for microscopic changes to the surface of your cervix (Pap test). You may be encouraged to have this screening done every 3 years, beginning at age 64.  For women ages 44-65, health care providers may recommend pelvic exams and Pap testing every 3 years, or they may recommend the Pap and pelvic exam, combined with testing for human papilloma virus (HPV), every 5 years. Some types of HPV increase your risk of cervical cancer. Testing for HPV may also be done on women of any age with unclear Pap test results.  Other health care providers may not recommend any screening for nonpregnant women who are considered low risk for pelvic cancer and who do not have symptoms. Ask your health care provider if a screening pelvic exam is right for you.  If you have had past treatment for cervical cancer or a condition that could lead to cancer, you need Pap tests and screening for cancer for at least 20 years after your treatment. If Pap tests have been discontinued, your risk factors (such as having a new sexual partner) need to be reassessed to determine if screening should resume. Some women have medical problems that increase the chance of getting cervical cancer. In these cases, your health care provider may recommend more frequent screening and Pap tests. Colorectal Cancer  This type of cancer can  be detected and often prevented.  Routine colorectal cancer screening usually begins at 80 years of age and continues through 80 years of age.  Your health care provider may recommend screening at an earlier age if you have risk factors for colon cancer.  Your health care provider may also recommend using home test kits to check for hidden blood in the stool.  A small camera at the end of a tube can be used to examine your colon directly (sigmoidoscopy or colonoscopy). This is done to check for the earliest forms of colorectal cancer.  Routine screening usually begins at age 31.  Direct examination of the colon should be repeated every 5-10 years through 80 years of age. However, you may need to be screened more often if early forms of precancerous polyps or small growths are found. Skin Cancer  Check your skin from head to toe regularly.  Tell your health care provider about any new  moles or changes in moles, especially if there is a change in a mole's shape or color.  Also tell your health care provider if you have a mole that is larger than the size of a pencil eraser.  Always use sunscreen. Apply sunscreen liberally and repeatedly throughout the day.  Protect yourself by wearing long sleeves, pants, a wide-brimmed hat, and sunglasses whenever you are outside. HEART DISEASE, DIABETES, AND HIGH BLOOD PRESSURE   High blood pressure causes heart disease and increases the risk of stroke. High blood pressure is more likely to develop in:  People who have blood pressure in the high end of the normal range (130-139/85-89 mm Hg).  People who are overweight or obese.  People who are African American.  If you are 35-36 years of age, have your blood pressure checked every 3-5 years. If you are 77 years of age or older, have your blood pressure checked every year. You should have your blood pressure measured twice--once when you are at a hospital or clinic, and once when you are not at a  hospital or clinic. Record the average of the two measurements. To check your blood pressure when you are not at a hospital or clinic, you can use:  An automated blood pressure machine at a pharmacy.  A home blood pressure monitor.  If you are between 73 years and 31 years old, ask your health care provider if you should take aspirin to prevent strokes.  Have regular diabetes screenings. This involves taking a blood sample to check your fasting blood sugar level.  If you are at a normal weight and have a low risk for diabetes, have this test once every three years after 80 years of age.  If you are overweight and have a high risk for diabetes, consider being tested at a younger age or more often. PREVENTING INFECTION  Hepatitis B  If you have a higher risk for hepatitis B, you should be screened for this virus. You are considered at high risk for hepatitis B if:  You were born in a country where hepatitis B is common. Ask your health care provider which countries are considered high risk.  Your parents were born in a high-risk country, and you have not been immunized against hepatitis B (hepatitis B vaccine).  You have HIV or AIDS.  You use needles to inject street drugs.  You live with someone who has hepatitis B.  You have had sex with someone who has hepatitis B.  You get hemodialysis treatment.  You take certain medicines for conditions, including cancer, organ transplantation, and autoimmune conditions. Hepatitis C  Blood testing is recommended for:  Everyone born from [birth year] through 1963.  Anyone with known risk factors for hepatitis C. Sexually transmitted infections (STIs)  You should be screened for sexually transmitted infections (STIs) including gonorrhea and chlamydia if:  You are sexually active and are younger than 80 years of age.  You are older than 80 years of age and your health care provider tells you that you are at risk for this type of  infection.  Your sexual activity has changed since you were last screened and you are at an increased risk for chlamydia or gonorrhea. Ask your health care provider if you are at risk.  If you do not have HIV, but are at risk, it may be recommended that you take a prescription medicine daily to prevent HIV infection. This is called pre-exposure prophylaxis (PrEP). You are considered at risk if:  You are sexually active and do not regularly use condoms or know the HIV status of your partner(s).  You take drugs by injection.  You are sexually active with a partner who has HIV. Talk with your health care provider about whether you are at high risk of being infected with HIV. If you choose to begin PrEP, you should first be tested for HIV. You should then be tested every 3 months for as long as you are taking PrEP.  PREGNANCY   If you are premenopausal and you may become pregnant, ask your health care provider about preconception counseling.  If you may become pregnant, take 400 to 800 micrograms (mcg) of folic acid every day.  If you want to prevent pregnancy, talk to your health care provider about birth control (contraception). OSTEOPOROSIS AND MENOPAUSE   Osteoporosis is a disease in which the bones lose minerals and strength with aging. This can result in serious bone fractures. Your risk for osteoporosis can be identified using a bone density scan.  If you are 53 years of age or older, or if you are at risk for osteoporosis and fractures, ask your health care provider if you should be screened.  Ask your health care provider whether you should take a calcium or vitamin D supplement to lower your risk for osteoporosis.  Menopause may have certain physical symptoms and risks.  Hormone replacement therapy may reduce some of these symptoms and risks. Talk to your health care provider about whether hormone replacement therapy is right for you.  HOME CARE INSTRUCTIONS   Schedule regular  health, dental, and eye exams.  Stay current with your immunizations.   Do not use any tobacco products including cigarettes, chewing tobacco, or electronic cigarettes.  If you are pregnant, do not drink alcohol.  If you are breastfeeding, limit how much and how often you drink alcohol.  Limit alcohol intake to no more than 1 drink per day for nonpregnant women. One drink equals 12 ounces of beer, 5 ounces of wine, or 1 ounces of hard liquor.  Do not use street drugs.  Do not share needles.  Ask your health care provider for help if you need support or information about quitting drugs.  Tell your health care provider if you often feel depressed.  Tell your health care provider if you have ever been abused or do not feel safe at home.   This information is not intended to replace advice given to you by your health care provider. Make sure you discuss any questions you have with your health care provider.   Document Released: 12/03/2010 Document Revised: 06/10/2014 Document Reviewed: 04/21/2013 Elsevier Interactive Patient Education [employer].

## 2015-08-09 ENCOUNTER — Encounter: Payer: Self-pay | Admitting: Internal Medicine

## 2015-08-09 ENCOUNTER — Ambulatory Visit (INDEPENDENT_AMBULATORY_CARE_PROVIDER_SITE_OTHER): Payer: Medicare Other | Admitting: Internal Medicine

## 2015-08-09 VITALS — BP 130/70 | HR 74 | Temp 97.5°F | Resp 18 | Ht 67.0 in | Wt 161.5 lb

## 2015-08-09 DIAGNOSIS — E7800 Pure hypercholesterolemia, unspecified: Secondary | ICD-10-CM | POA: Diagnosis not present

## 2015-08-09 DIAGNOSIS — Z0000 Encounter for general adult medical examination without abnormal findings: Secondary | ICD-10-CM

## 2015-08-09 DIAGNOSIS — M4807 Spinal stenosis, lumbosacral region: Secondary | ICD-10-CM

## 2015-08-09 LAB — COMPREHENSIVE METABOLIC PANEL
ALBUMIN: 4 g/dL (ref 3.5–5.2)
ALK PHOS: 45 U/L (ref 39–117)
ALT: 16 U/L (ref 0–35)
AST: 17 U/L (ref 0–37)
BILIRUBIN TOTAL: 0.4 mg/dL (ref 0.2–1.2)
BUN: 12 mg/dL (ref 6–23)
CALCIUM: 9.4 mg/dL (ref 8.4–10.5)
CO2: 31 mEq/L (ref 19–32)
CREATININE: 0.66 mg/dL (ref 0.40–1.20)
Chloride: 105 mEq/L (ref 96–112)
GFR: 91.69 mL/min (ref >60.00)
Glucose, Bld: 85 mg/dL (ref 70–99)
Potassium: 5 mEq/L (ref 3.5–5.1)
Sodium: 141 mEq/L (ref 135–145)
TOTAL PROTEIN: 6.3 g/dL (ref 6.0–8.3)

## 2015-08-09 LAB — CBC WITH DIFFERENTIAL/PLATELET
BASOS ABS: 0 10*3/uL (ref 0.0–0.1)
Basophils Relative: 0.4 % (ref 0.0–3.0)
EOS ABS: 0.1 10*3/uL (ref 0.0–0.7)
Eosinophils Relative: 2 % (ref 0.0–5.0)
HEMATOCRIT: 38.4 % (ref 36.0–46.0)
HEMOGLOBIN: 13 g/dL (ref 12.0–15.0)
LYMPHS PCT: 31.4 % (ref 12.0–46.0)
Lymphs Abs: 1.5 10*3/uL (ref 0.7–4.0)
MCHC: 33.9 g/dL (ref 30.0–36.0)
MCV: 92.6 fl (ref 78.0–100.0)
MONOS PCT: 6.8 % (ref 3.0–12.0)
Monocytes Absolute: 0.3 10*3/uL (ref 0.1–1.0)
Neutro Abs: 2.8 10*3/uL (ref 1.4–7.7)
Neutrophils Relative %: 59.4 % (ref 43.0–77.0)
Platelets: 218 10*3/uL (ref 150.0–400.0)
RBC: 4.15 Mil/uL (ref 3.87–5.11)
RDW: 13.8 % (ref 11.5–15.5)
WBC: 4.8 10*3/uL (ref 4.0–10.5)

## 2015-08-09 LAB — LIPID PANEL
CHOLESTEROL: 216 mg/dL — AB (ref 0–200)
HDL: 80.3 mg/dL (ref >39.00)
LDL CALC: 125 mg/dL — AB (ref 0–99)
NonHDL: 135.98
Total CHOL/HDL Ratio: 3
Triglycerides: 54 mg/dL (ref 0.0–149.0)
VLDL: 10.8 mg/dL (ref 0.0–40.0)

## 2015-08-09 LAB — TSH: TSH: 1.48 u[IU]/mL (ref 0.35–4.50)

## 2015-08-09 NOTE — Assessment & Plan Note (Signed)
Schedule physical next labs.  Mammogram 04/04/15 - Birads I.

## 2015-08-09 NOTE — Assessment & Plan Note (Signed)
Low cholesterol diet.  Continue exercise.  Follow lipid panel.  Check lipid panel today.

## 2015-08-09 NOTE — Progress Notes (Signed)
Pre-visit discussion using our clinic review tool. No additional management support is needed unless otherwise documented below in the visit note.  

## 2015-08-09 NOTE — Progress Notes (Signed)
Patient ID: Deborah Baxter, female   DOB: 11-14-1933, 82 y.o.   MRN: [id number]   Subjective:    Patient ID: Deborah Baxter, female    DOB: 1934-05-03, 82 y.o.   MRN: [id number]  HPI  Patient with past history of hypercholesterolemia and DDD who comes in today to follow up on these issues.  She is doing well.  Feels good.  Stays active.  Exercises.  No cardiac symptoms with increased activity or exertion.  No sob.  No acid reflux reported.  No abdominal pain or cramping.  No bowel change.  Takes miralax every three days and this works well for her.  Back pain is stable.     Past Medical History  Diagnosis Date  . Hypercholesterolemia   . Spinal stenosis     chronic back and leg pain  . Hx of degenerative disc disease   . Osteoarthritis    Past Surgical History  Procedure Laterality Date  . Appendectomy    . Tonsillectomy    . Ganglion cyst excision      right hand  . Btl  1976  . Bladder tack surgery  2000  . Tubal ligation    . Vitrectomy 25 gauge with scleral buckle Left 03/08/2015    Procedure: VITRECTOMY 25 GAUGE, membrane peel, 26 %  SF6 gas exchange;  Surgeon: Milus Height, MD;  Location: [facility] ORS;  Service: Ophthalmology;  Laterality: Left;  casette lot # [id number]  . Gas/fluid exchange Left 03/08/2015    Procedure: GAS/FLUID EXCHANGE;  Surgeon: Milus Height, MD;  Location: [facility] OR[facility];  Service: Ophthalmology;  Laterality: Left;  Marland Kitchen Eye surgery  03/19/15   Family History  Problem Relation Age of Onset  . Heart disease Father   . Leukemia Father   . Diabetes Mother   . Heart disease Mother     myocardial infarction  . Breast cancer      aunt  . Colon cancer Neg Hx   . Breast cancer Sister     in her 44's  . Breast cancer Maternal Aunt    Social History   Social History  . Marital Status: Widowed    Spouse Name: N/A  . Number of Children: 5  . Years of Education: N/A   Social History Main Topics  . Smoking status: Former Research scientist (life sciences)  . Smokeless tobacco: Never  Used  . Alcohol Use: No  . Drug Use: No  . Sexual Activity: No   Other Topics Concern  . None   Social History Narrative    Outpatient Encounter Prescriptions as of 08/09/2015  Medication Sig  . acetaminophen (TYLENOL) 650 MG CR tablet Take 650 mg by mouth every 8 (eight) hours as needed. Reported on 08/02/2015  . Calcium Carbonate-Vitamin D (CALCIUM 500 + D PO) Take by mouth 2 (two) times daily. 1 tab by mouth bid  . ibuprofen (ADVIL,MOTRIN) 200 MG tablet Take 200 mg by mouth every 6 (six) hours as needed for pain. Reported on 08/02/2015  . Multiple Vitamin (MULTIVITAMIN) tablet Take 1 tablet by mouth daily.  . Polyethylene Glycol 3350 (MIRALAX PO) Take by mouth. Every 3 days  . Tetrahydrozoline-Zn Sulfate (EYE DROPS RELIEF OP) Apply 1 drop to eye.   No facility-administered encounter medications on file as of 08/09/2015.    Review of Systems  Constitutional: Negative for appetite change and unexpected weight change.  HENT: Negative for congestion and sinus pressure.   Respiratory: Negative for cough, chest tightness and shortness of breath.  Cardiovascular: Negative for chest pain and palpitations.  Gastrointestinal: Negative for nausea, vomiting, abdominal pain and diarrhea.  Genitourinary: Negative for dysuria and difficulty urinating.  Musculoskeletal: Positive for back pain (chronic.  stable. ).  Skin: Negative for color change and rash.  Neurological: Negative for dizziness and headaches.  Psychiatric/Behavioral: Negative for dysphoric mood and agitation.       Objective:     Blood pressure rechecked by me:  138/78  Physical Exam  HENT:  Nose: Nose normal.  Mouth/Throat: Oropharynx is clear and moist.  Neck: Neck supple. No thyromegaly present.  Cardiovascular: Normal rate and regular rhythm.   Pulmonary/Chest: Breath sounds normal. No respiratory distress. She has no wheezes.  Abdominal: Soft. Bowel sounds are normal. There is no tenderness.  Musculoskeletal: She  exhibits no edema or tenderness.  Lymphadenopathy:    She has no cervical adenopathy.    BP 130/70 mmHg  Pulse 74  Temp(Src) 97.5 F (36.4 C) (Oral)  Resp 18  Ht 5\' 7"  (1.702 m)  Wt 161 lb 8 oz (73.256 kg)  BMI 25.29 kg/m2  SpO2 99%  LMP 06/26/1985 Wt Readings from Last 3 Encounters:  08/09/15 161 lb 8 oz (73.256 kg)  08/02/15 161 lb 12.8 oz (73.392 kg)  03/08/15 160 lb (72.576 kg)     Lab Results  Component Value Date   WBC 5.7 07/12/2014   HGB 13.8 07/12/2014   HCT 40.9 07/12/2014   PLT 240.0 07/12/2014   GLUCOSE 92 07/12/2014   CHOL 204* 07/12/2014   TRIG 91.0 07/12/2014   HDL 75.40 07/12/2014   LDLDIRECT 120.4 07/08/2013   LDLCALC 110* 07/12/2014   ALT 15 07/12/2014   AST 18 07/12/2014   NA 140 07/12/2014   K 4.5 07/12/2014   CL 105 07/12/2014   CREATININE 0.63 07/12/2014   BUN 16 07/12/2014   CO2 30 07/12/2014   TSH 1.58 07/12/2014    Mm Digital Screening Bilateral  04/04/2015  CLINICAL DATA:  Screening. EXAM: DIGITAL SCREENING BILATERAL MAMMOGRAM WITH CAD COMPARISON:  Previous exam(s). ACR Breast Density Category b: There are scattered areas of fibroglandular density. FINDINGS: There are no findings suspicious for malignancy. Images were processed with CAD. IMPRESSION: No mammographic evidence of malignancy. A result letter of this screening mammogram will be mailed directly to the patient. RECOMMENDATION: Screening mammogram in one year. (Code:SM-B-01Y) BI-RADS CATEGORY  1: Negative. Electronically Signed   By: Altamese Cabal M.D.   On: 04/04/2015 13:59       Assessment & Plan:   Problem List Items Addressed This Visit    Health care maintenance    Schedule physical next labs.  Mammogram 04/04/15 - Birads I.       Hypercholesterolemia - Primary    Low cholesterol diet.  Continue exercise.  Follow lipid panel.  Check lipid panel today.        Relevant Orders   CBC with Differential/Platelet   Comprehensive metabolic panel   TSH   Lipid panel    Spinal stenosis    Persistent chronic back pain.  Stable.  Is exercising.  Doing well.  Follow.            Einar Pheasant, MD

## 2015-08-09 NOTE — Assessment & Plan Note (Signed)
Persistent chronic back pain.  Stable.  Is exercising.  Doing well.  Follow.

## 2015-08-10 ENCOUNTER — Encounter: Payer: Self-pay | Admitting: *Deleted

## 2015-08-22 DIAGNOSIS — M9903 Segmental and somatic dysfunction of lumbar region: Secondary | ICD-10-CM | POA: Diagnosis not present

## 2015-08-22 DIAGNOSIS — M9905 Segmental and somatic dysfunction of pelvic region: Secondary | ICD-10-CM | POA: Diagnosis not present

## 2015-08-22 DIAGNOSIS — M5136 Other intervertebral disc degeneration, lumbar region: Secondary | ICD-10-CM | POA: Diagnosis not present

## 2015-08-22 DIAGNOSIS — M955 Acquired deformity of pelvis: Secondary | ICD-10-CM | POA: Diagnosis not present

## 2015-09-18 DIAGNOSIS — H35342 Macular cyst, hole, or pseudohole, left eye: Secondary | ICD-10-CM | POA: Diagnosis not present

## 2015-09-19 DIAGNOSIS — M955 Acquired deformity of pelvis: Secondary | ICD-10-CM | POA: Diagnosis not present

## 2015-09-19 DIAGNOSIS — M5136 Other intervertebral disc degeneration, lumbar region: Secondary | ICD-10-CM | POA: Diagnosis not present

## 2015-09-19 DIAGNOSIS — M9905 Segmental and somatic dysfunction of pelvic region: Secondary | ICD-10-CM | POA: Diagnosis not present

## 2015-09-19 DIAGNOSIS — M9903 Segmental and somatic dysfunction of lumbar region: Secondary | ICD-10-CM | POA: Diagnosis not present

## 2015-10-13 DIAGNOSIS — H25042 Posterior subcapsular polar age-related cataract, left eye: Secondary | ICD-10-CM | POA: Diagnosis not present

## 2015-10-17 DIAGNOSIS — M5136 Other intervertebral disc degeneration, lumbar region: Secondary | ICD-10-CM | POA: Diagnosis not present

## 2015-10-17 DIAGNOSIS — M9903 Segmental and somatic dysfunction of lumbar region: Secondary | ICD-10-CM | POA: Diagnosis not present

## 2015-10-17 DIAGNOSIS — M9905 Segmental and somatic dysfunction of pelvic region: Secondary | ICD-10-CM | POA: Diagnosis not present

## 2015-10-17 DIAGNOSIS — M955 Acquired deformity of pelvis: Secondary | ICD-10-CM | POA: Diagnosis not present

## 2015-11-14 DIAGNOSIS — M5136 Other intervertebral disc degeneration, lumbar region: Secondary | ICD-10-CM | POA: Diagnosis not present

## 2015-11-14 DIAGNOSIS — M9905 Segmental and somatic dysfunction of pelvic region: Secondary | ICD-10-CM | POA: Diagnosis not present

## 2015-11-14 DIAGNOSIS — M955 Acquired deformity of pelvis: Secondary | ICD-10-CM | POA: Diagnosis not present

## 2015-11-14 DIAGNOSIS — M9903 Segmental and somatic dysfunction of lumbar region: Secondary | ICD-10-CM | POA: Diagnosis not present

## 2015-11-29 DIAGNOSIS — H25042 Posterior subcapsular polar age-related cataract, left eye: Secondary | ICD-10-CM | POA: Diagnosis not present

## 2015-11-30 ENCOUNTER — Encounter: Payer: Self-pay | Admitting: *Deleted

## 2015-12-07 ENCOUNTER — Encounter: Payer: Self-pay | Admitting: Anesthesiology

## 2015-12-07 ENCOUNTER — Ambulatory Visit: Payer: Medicare Other | Admitting: Anesthesiology

## 2015-12-07 ENCOUNTER — Ambulatory Visit
Admission: RE | Admit: 2015-12-07 | Discharge: 2015-12-07 | Disposition: A | Discharge status: Home / Self Care | Payer: Medicare Other | Source: Ambulatory Visit | Attending: Ophthalmology | Admitting: Ophthalmology

## 2015-12-07 ENCOUNTER — Encounter
Admission: RE | Disposition: A | Discharge status: Home / Self Care | Payer: Self-pay | Source: Ambulatory Visit | Attending: Ophthalmology

## 2015-12-07 DIAGNOSIS — G709 Myoneural disorder, unspecified: Secondary | ICD-10-CM | POA: Insufficient documentation

## 2015-12-07 DIAGNOSIS — M1990 Unspecified osteoarthritis, unspecified site: Secondary | ICD-10-CM | POA: Diagnosis not present

## 2015-12-07 DIAGNOSIS — Z9851 Tubal ligation status: Secondary | ICD-10-CM | POA: Diagnosis not present

## 2015-12-07 DIAGNOSIS — Z791 Long term (current) use of non-steroidal anti-inflammatories (NSAID): Secondary | ICD-10-CM | POA: Diagnosis not present

## 2015-12-07 DIAGNOSIS — Z79899 Other long term (current) drug therapy: Secondary | ICD-10-CM | POA: Diagnosis not present

## 2015-12-07 DIAGNOSIS — E7800 Pure hypercholesterolemia, unspecified: Secondary | ICD-10-CM | POA: Diagnosis not present

## 2015-12-07 DIAGNOSIS — Z9889 Other specified postprocedural states: Secondary | ICD-10-CM | POA: Insufficient documentation

## 2015-12-07 DIAGNOSIS — H2512 Age-related nuclear cataract, left eye: Secondary | ICD-10-CM | POA: Diagnosis not present

## 2015-12-07 DIAGNOSIS — Z87891 Personal history of nicotine dependence: Secondary | ICD-10-CM | POA: Insufficient documentation

## 2015-12-07 DIAGNOSIS — Z9049 Acquired absence of other specified parts of digestive tract: Secondary | ICD-10-CM | POA: Insufficient documentation

## 2015-12-07 DIAGNOSIS — H25042 Posterior subcapsular polar age-related cataract, left eye: Secondary | ICD-10-CM | POA: Diagnosis not present

## 2015-12-07 HISTORY — DX: Myoneural disorder, unspecified: G70.9

## 2015-12-07 HISTORY — DX: Other seasonal allergic rhinitis: J30.2

## 2015-12-07 HISTORY — PX: CATARACT EXTRACTION W/PHACO: SHX586

## 2015-12-07 SURGERY — PHACOEMULSIFICATION, CATARACT, WITH IOL INSERTION
Anesthesia: Monitor Anesthesia Care | Site: Eye | Laterality: Left | Wound class: Clean

## 2015-12-07 MED ORDER — LIDOCAINE HCL (PF) 4 % IJ SOLN
INTRAOCULAR | Status: DC | PRN
  Administered 2015-12-07: 09:00:00 via OPHTHALMIC

## 2015-12-07 MED ORDER — MOXIFLOXACIN HCL 0.5 % OP SOLN: 1.0000 [drp] | OPHTHALMIC | Status: DC | PRN

## 2015-12-07 MED ORDER — TETRACAINE HCL 0.5 % OP SOLN
1.0000 [drp] | OPHTHALMIC | Status: AC | PRN
  Administered 2015-12-07: 1 [drp] via OPHTHALMIC

## 2015-12-07 MED ORDER — SODIUM CHLORIDE 0.9 % IV SOLN
INTRAVENOUS | Status: DC
  Administered 2015-12-07: 08:00:00 via INTRAVENOUS

## 2015-12-07 MED ORDER — MIDAZOLAM HCL 2 MG/2ML IJ SOLN
INTRAMUSCULAR | Status: DC | PRN
  Administered 2015-12-07 (×2): 0.5 mg via INTRAVENOUS

## 2015-12-07 MED ORDER — SODIUM HYALURONATE 23 MG/ML IO SOLN
INTRAOCULAR | Status: DC | PRN
Start: 2015-12-07 — End: 2015-12-07
  Administered 2015-12-07: 0.6 mL via INTRAOCULAR

## 2015-12-07 MED ORDER — MOXIFLOXACIN HCL 0.5 % OP SOLN
OPHTHALMIC | Status: DC | PRN
Start: 2015-12-07 — End: 2015-12-07
  Administered 2015-12-07: 1 [drp] via OPHTHALMIC

## 2015-12-07 MED ORDER — ARMC OPHTHALMIC DILATING GEL
1.0000 "application " | OPHTHALMIC | Status: AC | PRN
  Administered 2015-12-07 (×2): 1 via OPHTHALMIC

## 2015-12-07 MED ORDER — POVIDONE-IODINE 5 % OP SOLN
1.0000 | OPHTHALMIC | Status: AC | PRN
Start: 2015-12-07 — End: 2015-12-07
  Administered 2015-12-07: 1 via OPHTHALMIC

## 2015-12-07 MED ORDER — CEFUROXIME OPHTHALMIC INJECTION 1 MG/0.1 ML
INJECTION | OPHTHALMIC | Status: DC | PRN
  Administered 2015-12-07: 0.1 mL via INTRACAMERAL

## 2015-12-07 MED ORDER — ALFENTANIL 500 MCG/ML IJ INJ
INJECTION | INTRAMUSCULAR | Status: DC | PRN
  Administered 2015-12-07: 250 ug via INTRAVENOUS

## 2015-12-07 MED ORDER — SODIUM HYALURONATE 10 MG/ML IO SOLN
INTRAOCULAR | Status: DC | PRN
Start: 2015-12-07 — End: 2015-12-07
  Administered 2015-12-07: 0.85 mL via INTRAOCULAR

## 2015-12-07 MED ORDER — BSS IO SOLN
INTRAOCULAR | Status: DC | PRN
  Administered 2015-12-07: 09:00:00 via OPHTHALMIC

## 2015-12-07 SURGICAL SUPPLY — 22 items
CANNULA ANT/CHMB 27GA (MISCELLANEOUS) ×6 IMPLANT
CUP MEDICINE 2OZ PLAST GRAD ST (MISCELLANEOUS) ×3 IMPLANT
GLOVE BIO SURGEON STRL SZ8 (GLOVE) ×3 IMPLANT
GLOVE BIOGEL M 6.5 STRL (GLOVE) ×3 IMPLANT
GLOVE SURG LX 7.5 STRW (GLOVE) ×2
GLOVE SURG LX STRL 7.5 STRW (GLOVE) ×1 IMPLANT
GOWN STRL REUS W/ TWL LRG LVL3 (GOWN DISPOSABLE) ×2 IMPLANT
GOWN STRL REUS W/TWL LRG LVL3 (GOWN DISPOSABLE) ×4
LENS IOL ACRSF IQ PC 17.5 (Intraocular Lens) ×1 IMPLANT
LENS IOL ACRYSOF IQ POST 17.5 (Intraocular Lens) ×3 IMPLANT
PACK CATARACT (MISCELLANEOUS) ×3 IMPLANT
PACK CATARACT BRASINGTON LX (MISCELLANEOUS) ×3 IMPLANT
PACK EYE AFTER SURG (MISCELLANEOUS) ×3 IMPLANT
SOL BSS BAG (MISCELLANEOUS) ×3
SOL PREP PVP 2OZ (MISCELLANEOUS) ×3
SOLUTION BSS BAG (MISCELLANEOUS) ×1 IMPLANT
SOLUTION PREP PVP 2OZ (MISCELLANEOUS) ×1 IMPLANT
SYR 3ML LL SCALE MARK (SYRINGE) ×6 IMPLANT
SYR 5ML LL (SYRINGE) ×3 IMPLANT
SYR TB 1ML 27GX1/2 LL (SYRINGE) ×3 IMPLANT
WATER STERILE IRR 1000ML POUR (IV SOLUTION) ×3 IMPLANT
WIPE NON LINTING 3.25X3.25 (MISCELLANEOUS) ×3 IMPLANT

## 2015-12-07 NOTE — Anesthesia Preprocedure Evaluation (Signed)
Anesthesia Evaluation  Patient identified by MRN, date of birth, ID band Patient awake    Reviewed: Allergy & Precautions, NPO status , Patient's Chart, lab work & pertinent test results, reviewed documented beta blocker date and time   Airway Mallampati: II  TM Distance: >3 FB     Dental  (+) Chipped   Pulmonary former smoker,           Cardiovascular      Neuro/Psych  Neuromuscular disease    GI/Hepatic   Endo/Other    Renal/GU      Musculoskeletal  (+) Arthritis ,   Abdominal   Peds  Hematology   Anesthesia Other Findings   Reproductive/Obstetrics                             Anesthesia Physical Anesthesia Plan  ASA: III  Anesthesia Plan: MAC   Post-op Pain Management:    Induction:   Airway Management Planned:   Additional Equipment:   Intra-op Plan:   Post-operative Plan:   Informed Consent: I have reviewed the patients History and Physical, chart, labs and discussed the procedure including the risks, benefits and alternatives for the proposed anesthesia with the patient or authorized representative who has indicated his/her understanding and acceptance.     Plan Discussed with: CRNA  Anesthesia Plan Comments:         Anesthesia Quick Evaluation

## 2015-12-07 NOTE — Op Note (Signed)
OPERATIVE NOTE  Deborah Baxter [id number] 12/07/2015   PREOPERATIVE DIAGNOSIS:  Nuclear sclerotic cataract left eye.  H25.12   POSTOPERATIVE DIAGNOSIS:    Nuclear sclerotic cataract left eye.     PROCEDURE:  Phacoemusification with posterior chamber intraocular lens placement of the left eye   LENS:   Implant Name Type Inv. Item Serial No. Manufacturer Lot No. LRB No. Used  IMPLANT LENS - [id number] Intraocular Lens IMPLANT LENS [id number] ALCON   Left 1       [id number] 17.5   ULTRASOUND TIME: 1 minutes 23 seconds.  CDE 19.28   SURGEON:  Benay Pillow, MD, MPH   ANESTHESIA:  Topical with tetracaine drops and 2% Xylocaine jelly, augmented with 1% preservative-free intracameral lidocaine.   COMPLICATIONS:  None.   DESCRIPTION OF PROCEDURE:  The patient was identified in the holding room and transported to the operating room and placed in the supine position under the operating microscope.  The left eye was identified as the operative eye and it was prepped and draped in the usual sterile ophthalmic fashion.   A 1.0 millimeter clear-corneal paracentesis was made at the 5:00 position. 0.5 ml of preservative-free 1% lidocaine with epinephrine was injected into the anterior chamber.  The anterior chamber was filled with Healon 5 viscoelastic.  A 2.4 millimeter keratome was used to make a near-clear corneal incision at the 2:00 position.  A curvilinear capsulorrhexis was made with a cystotome and capsulorrhexis forceps.  Balanced salt solution was used to hydrodissect and hydrodelineate the nucleus.   Phacoemulsification was then used in stop and chop fashion to remove the lens nucleus and epinucleus.  The remaining cortex was then removed using the irrigation and aspiration handpiece. Healon was then placed into the capsular bag to distend it for lens placement.  A lens was then injected into the capsular bag.  The remaining viscoelastic was aspirated.   Wounds were hydrated with balanced salt  solution.  The anterior chamber was inflated to a physiologic pressure with balanced salt solution.   Intracameral cefuroxime 0.1 mL at 10mg /mL was injected into the eye.  No wound leaks were noted.  Topical Vigamox drops were applied to the eye.  The patient was taken to the recovery room in stable condition without complications of anesthesia or surgery  Benay Pillow 12/07/2015, 9:21 AM

## 2015-12-07 NOTE — H&P (Signed)
  The History and Physical notes are on paper, have been signed, and are to be scanned. The patient remains stable and unchanged from the H&P.   Previous H&P reviewed, patient examined, and there are no changes.  Benay Pillow 12/07/2015 8:46 AM

## 2015-12-07 NOTE — Anesthesia Postprocedure Evaluation (Signed)
Anesthesia Post Note  Patient: Reyana K Mix  Procedure(s) Performed: Procedure(s) (LRB): CATARACT EXTRACTION PHACO AND INTRAOCULAR LENS PLACEMENT (IOC) (Left)  Patient location during evaluation: Other Anesthesia Type: General Level of consciousness: awake and alert Pain management: pain level controlled Vital Signs Assessment: post-procedure vital signs reviewed and stable Respiratory status: spontaneous breathing, nonlabored ventilation, respiratory function stable and patient connected to nasal cannula oxygen Cardiovascular status: blood pressure returned to baseline and stable Postop Assessment: no signs of nausea or vomiting Anesthetic complications: no    Last Vitals:  Filed Vitals:   12/07/15 0924 12/07/15 0941  BP: 145/74 142/77  Pulse:  62  Temp: 36.6 C   Resp: 16 14    Last Pain:  Filed Vitals:   12/07/15 0943  PainSc: 0-No pain                 Ronasia Isola S

## 2015-12-07 NOTE — Transfer of Care (Signed)
Immediate Anesthesia Transfer of Care Note  Patient: Deborah Baxter  Procedure(s) Performed: Procedure(s) with comments: CATARACT EXTRACTION PHACO AND INTRAOCULAR LENS PLACEMENT (IOC) (Left) - [place] 1.23 AP% 23.2 CDE 19.28 Fluid Pack Lot # [id number]  Patient Location: PACU and Short Stay  Anesthesia Type:MAC  Level of Consciousness: awake, oriented and patient cooperative  Airway & Oxygen Therapy: Patient Spontanous Breathing  Post-op Assessment: Report given to RN and Post -op Vital signs reviewed and stable  Post vital signs: stable  Last Vitals:  Filed Vitals:   12/07/15 0727  BP: 168/80  Pulse: 75  Temp: 36.6 C  Resp: 16    Last Pain:  Filed Vitals:   12/07/15 0733  PainSc: 3          Complications: No apparent anesthesia complications

## 2015-12-07 NOTE — Discharge Instructions (Signed)
Cataract Surgery, Care After Refer to this sheet in the next few weeks. These instructions provide you with information on caring for yourself after your procedure. Your caregiver may also give you more specific instructions. Your treatment has been planned according to current medical practices, but problems sometimes occur. Call your caregiver if you have any problems or questions after your procedure.  HOME CARE INSTRUCTIONS   Avoid strenuous activities as directed by your caregiver.  Ask your caregiver when you can resume driving.  Use eyedrops or other medicines to help healing and control pressure inside your eye as directed by your caregiver.  Only take over-the-counter or prescription medicines for pain, discomfort, or fever as directed by your caregiver.  Do not to touch or rub your eyes.  You may be instructed to use a protective shield during the first few days and nights after surgery. If not, wear sunglasses to protect your eyes. This is to protect the eye from pressure or from being accidentally bumped.  Keep the area around your eye clean and dry. Avoid swimming or allowing water to hit you directly in the face while showering. Keep soap and shampoo out of your eyes.  Do not bend or lift heavy objects. Bending increases pressure in the eye. You can walk, climb stairs, and do light household chores.  Do not put a contact lens into the eye that had surgery until your caregiver says it is okay to do so.  Ask your doctor when you can return to work. This will depend on the kind of work that you do. If you work in a dusty environment, you may be advised to wear protective eyewear for a period of time.  Ask your caregiver when it will be safe to engage in sexual activity.  Continue with your regular eye exams as directed by your caregiver. What to expect:  It is normal to feel itching and mild discomfort for a few days after cataract surgery. Some fluid discharge is also common,  and your eye may be sensitive to light and touch.  After 1 to 2 days, even moderate discomfort should disappear. In most cases, healing will take about 6 weeks.  If you received an intraocular lens (IOL), you may notice that colors are very bright or have a blue tinge. Also, if you have been in bright sunlight, everything may appear reddish for a few hours. If you see these color tinges, it is because your lens is clear and no longer cloudy. Within a few months after receiving an IOL, these extra colors should go away. When you have healed, you will probably need new glasses. SEEK MEDICAL CARE IF:   You have increased bruising around your eye.  You have discomfort not helped by medicine. SEEK IMMEDIATE MEDICAL CARE IF:   You have a fever.  You have a worsening or sudden vision loss.  You have redness, swelling, or increasing pain in the eye.  You have a thick discharge from the eye that had surgery. MAKE SURE YOU:  Understand these instructions.  Will watch your condition.  Will get help right away if you are not doing well or get worse.   This information is not intended to replace advice given to you by your health care provider. Make sure you discuss any questions you have with your health care provider.   Document Released: 12/07/2004 Document Revised: 06/10/2014 Document Reviewed: 01/11/2011 Elsevier Interactive Patient Education 2016 [place]

## 2015-12-12 DIAGNOSIS — M9903 Segmental and somatic dysfunction of lumbar region: Secondary | ICD-10-CM | POA: Diagnosis not present

## 2015-12-12 DIAGNOSIS — M9905 Segmental and somatic dysfunction of pelvic region: Secondary | ICD-10-CM | POA: Diagnosis not present

## 2015-12-12 DIAGNOSIS — M5136 Other intervertebral disc degeneration, lumbar region: Secondary | ICD-10-CM | POA: Diagnosis not present

## 2015-12-12 DIAGNOSIS — M955 Acquired deformity of pelvis: Secondary | ICD-10-CM | POA: Diagnosis not present

## 2015-12-13 DIAGNOSIS — L821 Other seborrheic keratosis: Secondary | ICD-10-CM | POA: Diagnosis not present

## 2015-12-13 DIAGNOSIS — B078 Other viral warts: Secondary | ICD-10-CM | POA: Diagnosis not present

## 2015-12-13 DIAGNOSIS — D2239 Melanocytic nevi of other parts of face: Secondary | ICD-10-CM | POA: Diagnosis not present

## 2015-12-13 DIAGNOSIS — I781 Nevus, non-neoplastic: Secondary | ICD-10-CM | POA: Diagnosis not present

## 2016-01-09 DIAGNOSIS — M9905 Segmental and somatic dysfunction of pelvic region: Secondary | ICD-10-CM | POA: Diagnosis not present

## 2016-01-09 DIAGNOSIS — M955 Acquired deformity of pelvis: Secondary | ICD-10-CM | POA: Diagnosis not present

## 2016-01-09 DIAGNOSIS — M9903 Segmental and somatic dysfunction of lumbar region: Secondary | ICD-10-CM | POA: Diagnosis not present

## 2016-01-09 DIAGNOSIS — M5136 Other intervertebral disc degeneration, lumbar region: Secondary | ICD-10-CM | POA: Diagnosis not present

## 2016-02-06 DIAGNOSIS — M5136 Other intervertebral disc degeneration, lumbar region: Secondary | ICD-10-CM | POA: Diagnosis not present

## 2016-02-06 DIAGNOSIS — M9905 Segmental and somatic dysfunction of pelvic region: Secondary | ICD-10-CM | POA: Diagnosis not present

## 2016-02-06 DIAGNOSIS — M955 Acquired deformity of pelvis: Secondary | ICD-10-CM | POA: Diagnosis not present

## 2016-02-06 DIAGNOSIS — M9903 Segmental and somatic dysfunction of lumbar region: Secondary | ICD-10-CM | POA: Diagnosis not present

## 2016-02-14 ENCOUNTER — Encounter: Payer: Self-pay | Admitting: Internal Medicine

## 2016-02-14 ENCOUNTER — Ambulatory Visit (INDEPENDENT_AMBULATORY_CARE_PROVIDER_SITE_OTHER): Payer: Medicare Other | Admitting: Internal Medicine

## 2016-02-14 ENCOUNTER — Encounter (INDEPENDENT_AMBULATORY_CARE_PROVIDER_SITE_OTHER): Payer: Self-pay

## 2016-02-14 VITALS — BP 122/78 | HR 45 | Temp 97.7°F | Ht 67.0 in | Wt 158.6 lb

## 2016-02-14 DIAGNOSIS — Z0000 Encounter for general adult medical examination without abnormal findings: Secondary | ICD-10-CM

## 2016-02-14 DIAGNOSIS — Z1231 Encounter for screening mammogram for malignant neoplasm of breast: Secondary | ICD-10-CM

## 2016-02-14 DIAGNOSIS — E7800 Pure hypercholesterolemia, unspecified: Secondary | ICD-10-CM | POA: Diagnosis not present

## 2016-02-14 DIAGNOSIS — Z1239 Encounter for other screening for malignant neoplasm of breast: Secondary | ICD-10-CM | POA: Diagnosis not present

## 2016-02-14 DIAGNOSIS — K5730 Diverticulosis of large intestine without perforation or abscess without bleeding: Secondary | ICD-10-CM

## 2016-02-14 DIAGNOSIS — M4807 Spinal stenosis, lumbosacral region: Secondary | ICD-10-CM | POA: Diagnosis not present

## 2016-02-14 LAB — LIPID PANEL
CHOL/HDL RATIO: 3
Cholesterol: 218 mg/dL — ABNORMAL HIGH (ref 0–200)
HDL: 77.8 mg/dL (ref >39.00)
LDL CALC: 119 mg/dL — AB (ref 0–99)
NONHDL: 139.7
Triglycerides: 104 mg/dL (ref 0.0–149.0)
VLDL: 20.8 mg/dL (ref 0.0–40.0)

## 2016-02-14 NOTE — Progress Notes (Signed)
Patient ID: Deborah Baxter, female   DOB: [birth year], 82 y.o.   MRN: [id number]   Subjective:    Patient ID: Deborah Baxter, female    DOB: [birth year], 82 y.o.   MRN: [id number]  HPI  Patient with past history of hypercholesterolemia and DDD.  She comes in today to follow up on these issues as well as for a complete physical exam.  She states she is doing well.  Feels good.  Still exercising.  No chest pain.  No sob.  No acid reflux.  Still with back pain, but stable.  Bowels stable.     Past Medical History:  Diagnosis Date  . Hx of degenerative disc disease   . Hypercholesterolemia   . Neuromuscular disorder ([place])    pinched nerve  . Osteoarthritis   . Seasonal allergies   . Spinal stenosis    chronic back and leg pain   Past Surgical History:  Procedure Laterality Date  . APPENDECTOMY    . bladder tack surgery  2000  . BTL  1976  . CARPAL TUNNEL RELEASE    . CATARACT EXTRACTION W/PHACO Left 12/07/2015   Procedure: CATARACT EXTRACTION PHACO AND INTRAOCULAR LENS PLACEMENT (IOC);  Surgeon: Eulogio Bear, MD;  Location: [facility] ORS;  Service: Ophthalmology;  Laterality: Left;  [place] 1.23 AP% 23.2 CDE 19.28 Fluid Pack Lot # [id number]  . EYE SURGERY  03/19/15  . GANGLION CYST EXCISION     right hand  . GAS/FLUID EXCHANGE Left 03/08/2015   Procedure: GAS/FLUID EXCHANGE;  Surgeon: Milus Height, MD;  Location: [facility] ORS;  Service: Ophthalmology;  Laterality: Left;  . TONSILLECTOMY    . TUBAL LIGATION    . VITRECTOMY 25 GAUGE WITH SCLERAL BUCKLE Left 03/08/2015   Procedure: VITRECTOMY 25 GAUGE, membrane peel, 26 %  SF6 gas exchange;  Surgeon: Milus Height, MD;  Location: [facility] ORS;  Service: Ophthalmology;  Laterality: Left;  casette lot # [id number]   Family History  Problem Relation Age of Onset  . Heart disease Father   . Leukemia Father   . Diabetes Mother   . Heart disease Mother     myocardial infarction  . Breast cancer Sister     in her 73's  . Breast cancer      aunt  .  Breast cancer Maternal Aunt   . Colon cancer Neg Hx    Social History   Social History  . Marital status: Widowed    Spouse name: N/A  . Number of children: 5  . Years of education: N/A   Social History Main Topics  . Smoking status: Former Research scientist (life sciences)  . Smokeless tobacco: Never Used  . Alcohol use 0.0 oz/week     Comment: occassional  . Drug use: No  . Sexual activity: No   Other Topics Concern  . None   Social History Narrative  . None    Outpatient Encounter Prescriptions as of 02/14/2016  Medication Sig  . acetaminophen (TYLENOL) 650 MG CR tablet Take 650 mg by mouth every 8 (eight) hours as needed for pain. Reported on 08/02/2015  . Calcium Carbonate-Vitamin D (CALCIUM 500 + D PO) Take 1 tablet by mouth 2 (two) times daily.   Marland Kitchen ibuprofen (ADVIL,MOTRIN) 200 MG tablet Take 400 mg by mouth every 6 (six) hours as needed for mild pain. Reported on 08/02/2015  . Multiple Vitamin (MULTIVITAMIN) tablet Take 1 tablet by mouth daily.  Marland Kitchen OVER THE COUNTER MEDICATION Place 1 drop into both eyes every 8 (  eight) hours as needed (dry eyes.). Lubricant eye drops  . Polyethylene Glycol 3350 (MIRALAX PO) Take 17 g by mouth every 3 (three) days. Every 3 days   No facility-administered encounter medications on file as of 02/14/2016.     Review of Systems  Constitutional: Negative for appetite change and unexpected weight change.  HENT: Negative for congestion and sinus pressure.   Eyes: Negative for pain and visual disturbance.  Respiratory: Negative for cough, chest tightness and shortness of breath.   Cardiovascular: Negative for chest pain, palpitations and leg swelling.  Gastrointestinal: Negative for abdominal pain, diarrhea, nausea and vomiting.  Genitourinary: Negative for difficulty urinating and dysuria.  Musculoskeletal: Negative for joint swelling.       Chronic back pain.   Skin: Negative for color change and rash.  Neurological: Negative for dizziness, light-headedness and  headaches.  Hematological: Negative for adenopathy. Does not bruise/bleed easily.  Psychiatric/Behavioral: Negative for agitation and dysphoric mood.       Objective:     Blood pressure rechecked by me:  122/78  Physical Exam  Constitutional: She is oriented to person, place, and time. She appears well-developed and well-nourished. No distress.  HENT:  Nose: Nose normal.  Mouth/Throat: Oropharynx is clear and moist.  Eyes: Right eye exhibits no discharge. Left eye exhibits no discharge. No scleral icterus.  Neck: Neck supple. No thyromegaly present.  Cardiovascular: Normal rate and regular rhythm.   Pulmonary/Chest: Breath sounds normal. No accessory muscle usage. No tachypnea. No respiratory distress. She has no decreased breath sounds. She has no wheezes. She has no rhonchi. Right breast exhibits no inverted nipple, no mass, no nipple discharge and no tenderness (no axillary adenopathy). Left breast exhibits no inverted nipple, no mass, no nipple discharge and no tenderness (no axilarry adenopathy).  Abdominal: Soft. Bowel sounds are normal. There is no tenderness.  Musculoskeletal: She exhibits no edema or tenderness.  Lymphadenopathy:    She has no cervical adenopathy.  Neurological: She is alert and oriented to person, place, and time.  Skin: Skin is warm. No rash noted. No erythema.  Psychiatric: She has a normal mood and affect. Her behavior is normal.    BP 122/78   Pulse (!) 45   Temp 97.7 F (36.5 C) (Oral)   Ht 5\' 7"  (1.702 m)   Wt 158 lb 9.6 oz (71.9 kg)   LMP 06/26/1985   SpO2 91%   BMI 24.84 kg/m  Wt Readings from Last 3 Encounters:  02/14/16 158 lb 9.6 oz (71.9 kg)  12/07/15 160 lb (72.6 kg)  08/09/15 161 lb 8 oz (73.3 kg)     Lab Results  Component Value Date   WBC 4.8 08/09/2015   HGB 13.0 08/09/2015   HCT 38.4 08/09/2015   PLT 218.0 08/09/2015   GLUCOSE 85 08/09/2015   CHOL 216 (H) 08/09/2015   TRIG 54.0 08/09/2015   HDL 80.30 08/09/2015    LDLDIRECT 120.4 07/08/2013   LDLCALC 125 (H) 08/09/2015   ALT 16 08/09/2015   AST 17 08/09/2015   NA 141 08/09/2015   K 5.0 08/09/2015   CL 105 08/09/2015   CREATININE 0.66 08/09/2015   BUN 12 08/09/2015   CO2 31 08/09/2015   TSH 1.48 08/09/2015       Assessment & Plan:   Problem List Items Addressed This Visit    Diverticulosis    Colonoscopy 08/31/12 - as outlined.  Recommended f/u colonoscopy in 10 years.        Health care maintenance  Physical today 02/14/16.  Colonoscopy 08/31/12.  Normal bone denisty 2011.  Mammogram 04/04/15 - Birads I.  Scheduled for f/u mammogram 04/04/16.       Hypercholesterolemia    Low cholesterol diet and exercise.  Follow lipid panel.       Relevant Orders   Lipid panel   Spinal stenosis    Persistent chronic back pain.  Uses tylenol prn.  Stable.  Follow.        Other Visit Diagnoses    Screening breast examination    -  Primary   Encounter for screening mammogram for high-risk patient       Relevant Orders   MM Digital Screening       Einar Pheasant, MD

## 2016-02-14 NOTE — Assessment & Plan Note (Signed)
Persistent chronic back pain.  Uses tylenol prn.  Stable.  Follow.

## 2016-02-14 NOTE — Assessment & Plan Note (Signed)
Physical today 02/14/16.  Colonoscopy 08/31/12.  Normal bone denisty 2011.  Mammogram 04/04/15 - Birads I.  Scheduled for f/u mammogram 04/04/16.

## 2016-02-14 NOTE — Assessment & Plan Note (Signed)
Low cholesterol diet and exercise.  Follow lipid panel.   

## 2016-02-14 NOTE — Progress Notes (Signed)
Pre visit review using our clinic review tool, if applicable. No additional management support is needed unless otherwise documented below in the visit note. 

## 2016-02-14 NOTE — Assessment & Plan Note (Signed)
Colonoscopy 08/31/12 - as outlined.  Recommended f/u colonoscopy in 10 years.

## 2016-02-14 NOTE — Addendum Note (Signed)
Addended by: Kerin Salen R on: 02/14/2016 10:06 AM   Modules accepted: Orders

## 2016-03-05 DIAGNOSIS — Z23 Encounter for immunization: Secondary | ICD-10-CM | POA: Diagnosis not present

## 2016-03-05 DIAGNOSIS — H35342 Macular cyst, hole, or pseudohole, left eye: Secondary | ICD-10-CM | POA: Diagnosis not present

## 2016-03-06 ENCOUNTER — Telehealth: Payer: Self-pay | Admitting: *Deleted

## 2016-03-06 NOTE — Telephone Encounter (Signed)
FYI pt had her flu shot on 10/03 at [facility]

## 2016-03-06 NOTE — Telephone Encounter (Signed)
abstracted 

## 2016-03-12 DIAGNOSIS — M9903 Segmental and somatic dysfunction of lumbar region: Secondary | ICD-10-CM | POA: Diagnosis not present

## 2016-03-12 DIAGNOSIS — M5136 Other intervertebral disc degeneration, lumbar region: Secondary | ICD-10-CM | POA: Diagnosis not present

## 2016-03-12 DIAGNOSIS — M9905 Segmental and somatic dysfunction of pelvic region: Secondary | ICD-10-CM | POA: Diagnosis not present

## 2016-03-12 DIAGNOSIS — M955 Acquired deformity of pelvis: Secondary | ICD-10-CM | POA: Diagnosis not present

## 2016-04-04 ENCOUNTER — Ambulatory Visit
Admission: RE | Admit: 2016-04-04 | Discharge: 2016-04-04 | Disposition: A | Discharge status: Home / Self Care | Payer: Medicare Other | Source: Ambulatory Visit | Attending: Internal Medicine | Admitting: Internal Medicine

## 2016-04-04 DIAGNOSIS — Z1231 Encounter for screening mammogram for malignant neoplasm of breast: Secondary | ICD-10-CM | POA: Insufficient documentation

## 2016-04-08 DIAGNOSIS — M9905 Segmental and somatic dysfunction of pelvic region: Secondary | ICD-10-CM | POA: Diagnosis not present

## 2016-04-08 DIAGNOSIS — M955 Acquired deformity of pelvis: Secondary | ICD-10-CM | POA: Diagnosis not present

## 2016-04-08 DIAGNOSIS — M5136 Other intervertebral disc degeneration, lumbar region: Secondary | ICD-10-CM | POA: Diagnosis not present

## 2016-04-08 DIAGNOSIS — M9903 Segmental and somatic dysfunction of lumbar region: Secondary | ICD-10-CM | POA: Diagnosis not present

## 2016-04-10 ENCOUNTER — Telehealth: Payer: Self-pay | Admitting: *Deleted

## 2016-04-10 NOTE — Telephone Encounter (Signed)
Please advise 

## 2016-04-10 NOTE — Telephone Encounter (Signed)
Patient has requested to have a written statement for TOPS(Take Off Pounds), stating that her weight goal can be lowered to 155 lbs. Pt stated that she has reached her weight goal,however would like to lose 7 more pounds.  Pt contact [phone]

## 2016-04-10 NOTE — Telephone Encounter (Signed)
I wrote rx for goal weight of 155 pounds.  She was 158 the last visit here.  I think 155 is ok.  Don't want her trying to lose a lot more.

## 2016-04-11 NOTE — Telephone Encounter (Signed)
Left message to return call 

## 2016-04-11 NOTE — Telephone Encounter (Signed)
Mailed to patient

## 2016-05-07 DIAGNOSIS — M9905 Segmental and somatic dysfunction of pelvic region: Secondary | ICD-10-CM | POA: Diagnosis not present

## 2016-05-07 DIAGNOSIS — M5136 Other intervertebral disc degeneration, lumbar region: Secondary | ICD-10-CM | POA: Diagnosis not present

## 2016-05-07 DIAGNOSIS — M9903 Segmental and somatic dysfunction of lumbar region: Secondary | ICD-10-CM | POA: Diagnosis not present

## 2016-05-07 DIAGNOSIS — M955 Acquired deformity of pelvis: Secondary | ICD-10-CM | POA: Diagnosis not present

## 2016-06-04 DIAGNOSIS — M5136 Other intervertebral disc degeneration, lumbar region: Secondary | ICD-10-CM | POA: Diagnosis not present

## 2016-06-04 DIAGNOSIS — M955 Acquired deformity of pelvis: Secondary | ICD-10-CM | POA: Diagnosis not present

## 2016-06-04 DIAGNOSIS — M9903 Segmental and somatic dysfunction of lumbar region: Secondary | ICD-10-CM | POA: Diagnosis not present

## 2016-06-04 DIAGNOSIS — M9905 Segmental and somatic dysfunction of pelvic region: Secondary | ICD-10-CM | POA: Diagnosis not present

## 2016-07-09 DIAGNOSIS — M9905 Segmental and somatic dysfunction of pelvic region: Secondary | ICD-10-CM | POA: Diagnosis not present

## 2016-07-09 DIAGNOSIS — M5136 Other intervertebral disc degeneration, lumbar region: Secondary | ICD-10-CM | POA: Diagnosis not present

## 2016-07-09 DIAGNOSIS — M9903 Segmental and somatic dysfunction of lumbar region: Secondary | ICD-10-CM | POA: Diagnosis not present

## 2016-07-09 DIAGNOSIS — M955 Acquired deformity of pelvis: Secondary | ICD-10-CM | POA: Diagnosis not present

## 2016-08-02 ENCOUNTER — Ambulatory Visit (INDEPENDENT_AMBULATORY_CARE_PROVIDER_SITE_OTHER): Payer: Medicare Other

## 2016-08-02 VITALS — BP 126/76 | HR 80 | Temp 97.9°F | Resp 14 | Ht 67.0 in | Wt 161.1 lb

## 2016-08-02 DIAGNOSIS — Z0000 Encounter for general adult medical examination without abnormal findings: Secondary | ICD-10-CM

## 2016-08-02 NOTE — Progress Notes (Signed)
Subjective:   Deborah Baxter is a 83 y.o. female who presents for Medicare Annual (Subsequent) preventive examination.  Review of Systems:  No ROS.  Medicare Wellness Visit.  Cardiac Risk Factors include: advanced age (>59men, >16 women);hypertension     Objective:     Vitals: BP 126/76 (BP Location: Left Arm, Patient Position: Sitting, Cuff Size: Normal)   Pulse 80   Temp 97.9 F (36.6 C)   Resp 14   Ht 5\' 7"  (1.702 m)   Wt 161 lb 1.9 oz (73.1 kg)   LMP 06/26/1985   SpO2 96%   BMI 25.23 kg/m   Body mass index is 25.23 kg/m.   Tobacco History  Smoking Status  . Former Smoker  Smokeless Tobacco  . Never Used     Counseling given: Not Answered   Past Medical History:  Diagnosis Date  . Hx of degenerative disc disease   . Hypercholesterolemia   . Neuromuscular disorder ([place])    pinched nerve  . Osteoarthritis   . Seasonal allergies   . Spinal stenosis    chronic back and leg pain   Past Surgical History:  Procedure Laterality Date  . APPENDECTOMY    . bladder tack surgery  2000  . BTL  1976  . CARPAL TUNNEL RELEASE    . CATARACT EXTRACTION W/PHACO Left 12/07/2015   Procedure: CATARACT EXTRACTION PHACO AND INTRAOCULAR LENS PLACEMENT (IOC);  Surgeon: Eulogio Bear, MD;  Location: [facility] ORS;  Service: Ophthalmology;  Laterality: Left;  [place] 1.23 AP% 23.2 CDE 19.28 Fluid Pack Lot # [id number]  . EYE SURGERY  03/19/15  . GANGLION CYST EXCISION     right hand  . GAS/FLUID EXCHANGE Left 03/08/2015   Procedure: GAS/FLUID EXCHANGE;  Surgeon: Milus Height, MD;  Location: [facility] ORS;  Service: Ophthalmology;  Laterality: Left;  . TONSILLECTOMY    . TUBAL LIGATION    . VITRECTOMY 25 GAUGE WITH SCLERAL BUCKLE Left 03/08/2015   Procedure: VITRECTOMY 25 GAUGE, membrane peel, 26 %  SF6 gas exchange;  Surgeon: Milus Height, MD;  Location: [facility] ORS;  Service: Ophthalmology;  Laterality: Left;  casette lot # [id number]   Family History  Problem Relation Age of Onset    . Heart disease Father   . Leukemia Father   . Diabetes Mother   . Heart disease Mother     myocardial infarction  . Breast cancer Sister     in her 79's  . Cancer Sister     lung cancer  . Breast cancer      aunt  . Breast cancer Maternal Aunt   . Colon cancer Neg Hx    History  Sexual Activity  . Sexual activity: No    Outpatient Encounter Prescriptions as of 08/02/2016  Medication Sig  . acetaminophen (TYLENOL) 650 MG CR tablet Take 650 mg by mouth every 8 (eight) hours as needed for pain. Reported on 08/02/2015  . Calcium Carbonate-Vitamin D (CALCIUM 500 + D PO) Take 1 tablet by mouth 2 (two) times daily.   Marland Kitchen ibuprofen (ADVIL,MOTRIN) 200 MG tablet Take 400 mg by mouth every 6 (six) hours as needed for mild pain. Reported on 08/02/2015  . Multiple Vitamin (MULTIVITAMIN) tablet Take 1 tablet by mouth daily.  Marland Kitchen OVER THE COUNTER MEDICATION Place 1 drop into both eyes every 8 (eight) hours as needed (dry eyes.). Lubricant eye drops  . Polyethylene Glycol 3350 (MIRALAX PO) Take 17 g by mouth every 3 (three) days. Every 3 days  No facility-administered encounter medications on file as of 08/02/2016.     Activities of Daily Living In your present state of health, do you have any difficulty performing the following activities: 08/02/2016  Hearing? N  Vision? N  Difficulty concentrating or making decisions? N  Walking or climbing stairs? N  Dressing or bathing? N  Doing errands, shopping? N  Preparing Food and eating ? N  Using the Toilet? N  In the past six months, have you accidently leaked urine? Y  Do you have problems with loss of bowel control? N  Managing your Medications? N  Managing your Finances? N  Housekeeping or managing your Housekeeping? N  Some recent data might be hidden    Patient Care Team: Einar Pheasant, MD as PCP - General (Internal Medicine)    Assessment:    This is a routine wellness examination for Deborah Baxter. The goal of the wellness visit is to assist  the patient how to close the gaps in care and create a preventative care plan for the patient.   Taking calcium VIT D as appropriate/Osteoporosis risk reviewed.  Medications reviewed; taking without issues or barriers.  Safety issues reviewed; smoke detectors in the home. No firearms in the home.  Wears seatbelts when driving or riding with others. Patient does wear sunscreen or protective clothing when in direct sunlight. No violence in the home.  Patient is alert, normal appearance, oriented to person/place/and time. Correctly identified the president of the [place], recall of 3/3 objects, and performing simple calculations.  Patient displays appropriate judgement and can read correct time from watch face.  No new identified risk were noted.  No failures at ADL's or IADL's.   BMI- normal. Discussed the importance of a healthy diet, water intake and exercise. Educational material provided.   Diet: Breakfast: Cereal, fruit Lunch: Lean meat, vegetable Dinner: Dance movement psychotherapist, green vegetable Daily fluid intake: 3 cups of caffeine, 3 cups of water  HTN- followed by PCP.  Dental- every six months.  Dr. Daily.  Eye- Visual acuity not assessed per patient preference since they have regular follow up with the ophthalmologist.  Wears corrective lenses.  Sleep patterns- Sleeps 5 hours at night.  Wakes feeling rested.  TDAP vaccine deferred per patient preference.  Follow up with insurance.  Educational material provided.  Patient Concerns: None at this time. Follow up with PCP as needed.  Exercise Activities and Dietary recommendations Current Exercise Habits: Home exercise routine, Type of exercise: walking, Intensity: Mild  Goals    . Healthy Lifestyle          Maintain home physical therapy exercises.  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 Stay hydrated! Drink plenty of fluids/water Choose low carb foods, lean proteins (chicken, [place], fish), fruits and vegetables      Fall Risk Fall  Risk  08/02/2016 02/14/2016 08/02/2015 07/12/2014 07/08/2013  Falls in the past year? No No No No No   Depression Screen PHQ 2/9 Scores 08/02/2016 02/14/2016 08/02/2015 07/12/2014  PHQ - 2 Score 0 0 0 0     Cognitive Function MMSE - Mini Mental State Exam 08/02/2016 08/02/2015  Orientation to time 5 5  Orientation to Place 5 5  Registration 3 3  Attention/ Calculation 5 5  Recall 3 3  Language- name 2 objects 2 2  Language- repeat 1 1  Language- follow 3 step command 3 3  Language- read & follow direction 1 1  Write a sentence 1 1  Copy design 1 1  Total score  30 30        Immunization History  Administered Date(s) Administered  . Influenza Split 02/15/2014  . Influenza-Unspecified 02/20/2015, 03/05/2016  . Pneumococcal Conjugate-13 07/08/2013  . Pneumococcal Polysaccharide-23 07/12/2014   Screening Tests Health Maintenance  Topic Date Due  . TETANUS/TDAP  01/22/1953  . MAMMOGRAM  04/04/2017  . INFLUENZA VACCINE  Completed  . DEXA SCAN  Completed  . PNA vac Low Risk Adult  Completed      Plan:    End of life planning; Advance aging; Advanced directives discussed. Copy of current HCPOA/Living Will requested.    Medicare Attestation I have personally reviewed: The patient's medical and social history Their use of alcohol, tobacco or illicit drugs Their current medications and supplements The patient's functional ability including ADLs,fall risks, home safety risks, cognitive, and hearing and visual impairment Diet and physical activities Evidence for depression   The patient's weight, height, BMI, and visual acuity have been recorded in the chart.  I have made referrals and provided education to the patient based on review of the above and I have provided the patient with a written personalized care plan for preventive services.    During the course of the visit the patient was educated and counseled about the following appropriate screening and preventive services:   Vaccines  to include Pneumoccal, Influenza, Hepatitis B, Td, Zostavax, HCV  Electrocardiogram  Cardiovascular Disease  Colorectal cancer screening  Bone density screening  Diabetes screening  Glaucoma screening  Mammography/PAP  Nutrition counseling   Patient Instructions (the written plan) was given to the patient.   Varney Biles, LPN  [id number]   Reviewed above information.  Agree with plan.   Dr Nicki Reaper

## 2016-08-02 NOTE — Patient Instructions (Addendum)
  Ms. Deborah Baxter , Thank you for taking time to come for your Medicare Wellness Visit. I appreciate your ongoing commitment to your health goals. Please review the following plan we discussed and let me know if I can assist you in the future.   Follow up with Dr. Nicki Reaper as needed.    Bring a copy of your [place] and/or Living Will to be scanned into chart.  Have a great day!  These are the goals we discussed: Goals    . Healthy Lifestyle          Maintain home physical therapy exercises.  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 Stay hydrated! Drink plenty of fluids/water Choose low carb foods, lean proteins (chicken, [place], fish), fruits and vegetables       This is a list of the screening recommended for you and due dates:  Health Maintenance  Topic Date Due  . Tetanus Vaccine  01/22/1953  . Mammogram  04/04/2017  . Flu Shot  Completed  . DEXA scan (bone density measurement)  Completed  . Pneumonia vaccines  Completed

## 2016-08-06 DIAGNOSIS — M955 Acquired deformity of pelvis: Secondary | ICD-10-CM | POA: Diagnosis not present

## 2016-08-06 DIAGNOSIS — M9905 Segmental and somatic dysfunction of pelvic region: Secondary | ICD-10-CM | POA: Diagnosis not present

## 2016-08-06 DIAGNOSIS — M9903 Segmental and somatic dysfunction of lumbar region: Secondary | ICD-10-CM | POA: Diagnosis not present

## 2016-08-06 DIAGNOSIS — M5136 Other intervertebral disc degeneration, lumbar region: Secondary | ICD-10-CM | POA: Diagnosis not present

## 2016-09-02 DIAGNOSIS — H2511 Age-related nuclear cataract, right eye: Secondary | ICD-10-CM | POA: Diagnosis not present

## 2016-09-03 DIAGNOSIS — M955 Acquired deformity of pelvis: Secondary | ICD-10-CM | POA: Diagnosis not present

## 2016-09-03 DIAGNOSIS — M5136 Other intervertebral disc degeneration, lumbar region: Secondary | ICD-10-CM | POA: Diagnosis not present

## 2016-09-03 DIAGNOSIS — M9905 Segmental and somatic dysfunction of pelvic region: Secondary | ICD-10-CM | POA: Diagnosis not present

## 2016-09-03 DIAGNOSIS — M9903 Segmental and somatic dysfunction of lumbar region: Secondary | ICD-10-CM | POA: Diagnosis not present

## 2016-10-01 DIAGNOSIS — M9903 Segmental and somatic dysfunction of lumbar region: Secondary | ICD-10-CM | POA: Diagnosis not present

## 2016-10-01 DIAGNOSIS — M955 Acquired deformity of pelvis: Secondary | ICD-10-CM | POA: Diagnosis not present

## 2016-10-01 DIAGNOSIS — M9905 Segmental and somatic dysfunction of pelvic region: Secondary | ICD-10-CM | POA: Diagnosis not present

## 2016-10-01 DIAGNOSIS — M5136 Other intervertebral disc degeneration, lumbar region: Secondary | ICD-10-CM | POA: Diagnosis not present

## 2016-10-29 DIAGNOSIS — M5136 Other intervertebral disc degeneration, lumbar region: Secondary | ICD-10-CM | POA: Diagnosis not present

## 2016-10-29 DIAGNOSIS — M955 Acquired deformity of pelvis: Secondary | ICD-10-CM | POA: Diagnosis not present

## 2016-10-29 DIAGNOSIS — M9903 Segmental and somatic dysfunction of lumbar region: Secondary | ICD-10-CM | POA: Diagnosis not present

## 2016-10-29 DIAGNOSIS — M9905 Segmental and somatic dysfunction of pelvic region: Secondary | ICD-10-CM | POA: Diagnosis not present

## 2016-12-02 DIAGNOSIS — M9905 Segmental and somatic dysfunction of pelvic region: Secondary | ICD-10-CM | POA: Diagnosis not present

## 2016-12-02 DIAGNOSIS — M9903 Segmental and somatic dysfunction of lumbar region: Secondary | ICD-10-CM | POA: Diagnosis not present

## 2016-12-02 DIAGNOSIS — M5136 Other intervertebral disc degeneration, lumbar region: Secondary | ICD-10-CM | POA: Diagnosis not present

## 2016-12-02 DIAGNOSIS — M955 Acquired deformity of pelvis: Secondary | ICD-10-CM | POA: Diagnosis not present

## 2016-12-16 DIAGNOSIS — L821 Other seborrheic keratosis: Secondary | ICD-10-CM | POA: Diagnosis not present

## 2016-12-16 DIAGNOSIS — D1801 Hemangioma of skin and subcutaneous tissue: Secondary | ICD-10-CM | POA: Diagnosis not present

## 2016-12-16 DIAGNOSIS — B078 Other viral warts: Secondary | ICD-10-CM | POA: Diagnosis not present

## 2016-12-16 DIAGNOSIS — D2239 Melanocytic nevi of other parts of face: Secondary | ICD-10-CM | POA: Diagnosis not present

## 2016-12-31 DIAGNOSIS — M9905 Segmental and somatic dysfunction of pelvic region: Secondary | ICD-10-CM | POA: Diagnosis not present

## 2016-12-31 DIAGNOSIS — M5136 Other intervertebral disc degeneration, lumbar region: Secondary | ICD-10-CM | POA: Diagnosis not present

## 2016-12-31 DIAGNOSIS — M955 Acquired deformity of pelvis: Secondary | ICD-10-CM | POA: Diagnosis not present

## 2016-12-31 DIAGNOSIS — M9903 Segmental and somatic dysfunction of lumbar region: Secondary | ICD-10-CM | POA: Diagnosis not present

## 2017-01-20 DIAGNOSIS — B078 Other viral warts: Secondary | ICD-10-CM | POA: Diagnosis not present

## 2017-02-04 DIAGNOSIS — M9904 Segmental and somatic dysfunction of sacral region: Secondary | ICD-10-CM | POA: Diagnosis not present

## 2017-02-04 DIAGNOSIS — M9903 Segmental and somatic dysfunction of lumbar region: Secondary | ICD-10-CM | POA: Diagnosis not present

## 2017-02-04 DIAGNOSIS — M5416 Radiculopathy, lumbar region: Secondary | ICD-10-CM | POA: Diagnosis not present

## 2017-02-04 DIAGNOSIS — M62830 Muscle spasm of back: Secondary | ICD-10-CM | POA: Diagnosis not present

## 2017-02-24 DIAGNOSIS — B078 Other viral warts: Secondary | ICD-10-CM | POA: Diagnosis not present

## 2017-02-27 ENCOUNTER — Other Ambulatory Visit: Payer: Self-pay | Admitting: Internal Medicine

## 2017-02-27 DIAGNOSIS — Z1231 Encounter for screening mammogram for malignant neoplasm of breast: Secondary | ICD-10-CM

## 2017-03-04 DIAGNOSIS — M9904 Segmental and somatic dysfunction of sacral region: Secondary | ICD-10-CM | POA: Diagnosis not present

## 2017-03-04 DIAGNOSIS — M9903 Segmental and somatic dysfunction of lumbar region: Secondary | ICD-10-CM | POA: Diagnosis not present

## 2017-03-04 DIAGNOSIS — M62830 Muscle spasm of back: Secondary | ICD-10-CM | POA: Diagnosis not present

## 2017-03-04 DIAGNOSIS — M5416 Radiculopathy, lumbar region: Secondary | ICD-10-CM | POA: Diagnosis not present

## 2017-03-24 DIAGNOSIS — B078 Other viral warts: Secondary | ICD-10-CM | POA: Diagnosis not present

## 2017-04-01 DIAGNOSIS — M5416 Radiculopathy, lumbar region: Secondary | ICD-10-CM | POA: Diagnosis not present

## 2017-04-01 DIAGNOSIS — Z23 Encounter for immunization: Secondary | ICD-10-CM | POA: Diagnosis not present

## 2017-04-01 DIAGNOSIS — M9904 Segmental and somatic dysfunction of sacral region: Secondary | ICD-10-CM | POA: Diagnosis not present

## 2017-04-01 DIAGNOSIS — M9903 Segmental and somatic dysfunction of lumbar region: Secondary | ICD-10-CM | POA: Diagnosis not present

## 2017-04-01 DIAGNOSIS — M62830 Muscle spasm of back: Secondary | ICD-10-CM | POA: Diagnosis not present

## 2017-04-08 ENCOUNTER — Ambulatory Visit
Admission: RE | Admit: 2017-04-08 | Discharge: 2017-04-08 | Disposition: A | Discharge status: Home / Self Care | Payer: Medicare Other | Source: Ambulatory Visit | Attending: Internal Medicine | Admitting: Internal Medicine

## 2017-04-08 DIAGNOSIS — Z1231 Encounter for screening mammogram for malignant neoplasm of breast: Secondary | ICD-10-CM | POA: Insufficient documentation

## 2017-04-21 DIAGNOSIS — B078 Other viral warts: Secondary | ICD-10-CM | POA: Diagnosis not present

## 2017-04-29 DIAGNOSIS — M9903 Segmental and somatic dysfunction of lumbar region: Secondary | ICD-10-CM | POA: Diagnosis not present

## 2017-04-29 DIAGNOSIS — M9904 Segmental and somatic dysfunction of sacral region: Secondary | ICD-10-CM | POA: Diagnosis not present

## 2017-04-29 DIAGNOSIS — M5416 Radiculopathy, lumbar region: Secondary | ICD-10-CM | POA: Diagnosis not present

## 2017-04-29 DIAGNOSIS — M62830 Muscle spasm of back: Secondary | ICD-10-CM | POA: Diagnosis not present

## 2017-05-20 DIAGNOSIS — M62830 Muscle spasm of back: Secondary | ICD-10-CM | POA: Diagnosis not present

## 2017-05-20 DIAGNOSIS — M9903 Segmental and somatic dysfunction of lumbar region: Secondary | ICD-10-CM | POA: Diagnosis not present

## 2017-05-20 DIAGNOSIS — M5416 Radiculopathy, lumbar region: Secondary | ICD-10-CM | POA: Diagnosis not present

## 2017-05-20 DIAGNOSIS — M9904 Segmental and somatic dysfunction of sacral region: Secondary | ICD-10-CM | POA: Diagnosis not present

## 2017-06-04 DIAGNOSIS — M62830 Muscle spasm of back: Secondary | ICD-10-CM | POA: Diagnosis not present

## 2017-06-04 DIAGNOSIS — M5416 Radiculopathy, lumbar region: Secondary | ICD-10-CM | POA: Diagnosis not present

## 2017-06-04 DIAGNOSIS — M9904 Segmental and somatic dysfunction of sacral region: Secondary | ICD-10-CM | POA: Diagnosis not present

## 2017-06-04 DIAGNOSIS — M9903 Segmental and somatic dysfunction of lumbar region: Secondary | ICD-10-CM | POA: Diagnosis not present

## 2017-07-02 DIAGNOSIS — M9903 Segmental and somatic dysfunction of lumbar region: Secondary | ICD-10-CM | POA: Diagnosis not present

## 2017-07-02 DIAGNOSIS — M5416 Radiculopathy, lumbar region: Secondary | ICD-10-CM | POA: Diagnosis not present

## 2017-07-02 DIAGNOSIS — M62830 Muscle spasm of back: Secondary | ICD-10-CM | POA: Diagnosis not present

## 2017-07-02 DIAGNOSIS — M9904 Segmental and somatic dysfunction of sacral region: Secondary | ICD-10-CM | POA: Diagnosis not present

## 2017-07-29 DIAGNOSIS — M5416 Radiculopathy, lumbar region: Secondary | ICD-10-CM | POA: Diagnosis not present

## 2017-07-29 DIAGNOSIS — M62830 Muscle spasm of back: Secondary | ICD-10-CM | POA: Diagnosis not present

## 2017-07-29 DIAGNOSIS — M9904 Segmental and somatic dysfunction of sacral region: Secondary | ICD-10-CM | POA: Diagnosis not present

## 2017-07-29 DIAGNOSIS — M9903 Segmental and somatic dysfunction of lumbar region: Secondary | ICD-10-CM | POA: Diagnosis not present

## 2017-08-04 ENCOUNTER — Ambulatory Visit: Payer: PRIVATE HEALTH INSURANCE

## 2017-08-12 ENCOUNTER — Telehealth: Payer: Self-pay | Admitting: *Deleted

## 2017-08-12 ENCOUNTER — Ambulatory Visit (INDEPENDENT_AMBULATORY_CARE_PROVIDER_SITE_OTHER): Payer: Medicare Other

## 2017-08-12 VITALS — BP 120/80 | HR 69 | Temp 97.7°F | Resp 15 | Ht 67.0 in | Wt 157.8 lb

## 2017-08-12 DIAGNOSIS — E7800 Pure hypercholesterolemia, unspecified: Secondary | ICD-10-CM | POA: Diagnosis not present

## 2017-08-12 DIAGNOSIS — Z0000 Encounter for general adult medical examination without abnormal findings: Secondary | ICD-10-CM

## 2017-08-12 LAB — CBC WITH DIFFERENTIAL/PLATELET
BASOS ABS: 0 10*3/uL (ref 0.0–0.1)
Basophils Relative: 0.9 % (ref 0.0–3.0)
EOS ABS: 0.1 10*3/uL (ref 0.0–0.7)
Eosinophils Relative: 2.4 % (ref 0.0–5.0)
HCT: 40.5 % (ref 36.0–46.0)
Hemoglobin: 13.6 g/dL (ref 12.0–15.0)
LYMPHS ABS: 1.7 10*3/uL (ref 0.7–4.0)
Lymphocytes Relative: 38.5 % (ref 12.0–46.0)
MCHC: 33.7 g/dL (ref 30.0–36.0)
MCV: 92.6 fl (ref 78.0–100.0)
MONO ABS: 0.4 10*3/uL (ref 0.1–1.0)
MONOS PCT: 9.2 % (ref 3.0–12.0)
NEUTROS ABS: 2.2 10*3/uL (ref 1.4–7.7)
NEUTROS PCT: 49 % (ref 43.0–77.0)
Platelets: 240 10*3/uL (ref 150.0–400.0)
RBC: 4.37 Mil/uL (ref 3.87–5.11)
RDW: 14.1 % (ref 11.5–15.5)
WBC: 4.4 10*3/uL (ref 4.0–10.5)

## 2017-08-12 LAB — COMPREHENSIVE METABOLIC PANEL
ALT: 20 U/L (ref 0–35)
AST: 18 U/L (ref 0–37)
Albumin: 4.1 g/dL (ref 3.5–5.2)
Alkaline Phosphatase: 46 U/L (ref 39–117)
BILIRUBIN TOTAL: 0.5 mg/dL (ref 0.2–1.2)
BUN: 15 mg/dL (ref 6–23)
CO2: 32 meq/L (ref 19–32)
CREATININE: 0.64 mg/dL (ref 0.40–1.20)
Calcium: 9.9 mg/dL (ref 8.4–10.5)
Chloride: 103 mEq/L (ref 96–112)
GFR: 94.53 mL/min (ref >60.00)
GLUCOSE: 97 mg/dL (ref 70–99)
Potassium: 5 mEq/L (ref 3.5–5.1)
Sodium: 139 mEq/L (ref 135–145)
TOTAL PROTEIN: 7 g/dL (ref 6.0–8.3)

## 2017-08-12 LAB — LIPID PANEL
CHOL/HDL RATIO: 2
Cholesterol: 224 mg/dL — ABNORMAL HIGH (ref 0–200)
HDL: 93.7 mg/dL (ref >39.00)
LDL Cholesterol: 118 mg/dL — ABNORMAL HIGH (ref 0–99)
NONHDL: 130.39
Triglycerides: 63 mg/dL (ref 0.0–149.0)
VLDL: 12.6 mg/dL (ref 0.0–40.0)

## 2017-08-12 LAB — TSH: TSH: 3.16 u[IU]/mL (ref 0.35–4.50)

## 2017-08-12 LAB — LDL CHOLESTEROL, DIRECT: LDL DIRECT: 104 mg/dL

## 2017-08-12 NOTE — Telephone Encounter (Signed)
Copied from [place]. Topic: Inquiry >> Aug 12, 2017 12:46 PM Ahmed Prima L wrote: Reason for CRM: pt said that she was in the office today & her paperwork said she had her flu shot on 01/21/17. She said that is incorrect and she had it on 04/01/17

## 2017-08-12 NOTE — Patient Instructions (Addendum)
  Deborah Baxter , Thank you for taking time to come for your Medicare Wellness Visit. I appreciate your ongoing commitment to your health goals. Please review the following plan we discussed and let me know if I can assist you in the future.   Follow up with Dr. Nicki Reaper as needed.    Bring a copy of your [place] and/or Living Will to be scanned into chart.  Have a great day!  These are the goals we discussed: Goals    . Maintain Healthy Lifestyle     Exercise Stay hydrated Low cholesterol foods       This is a list of the screening recommended for you and due dates:  Health Maintenance  Topic Date Due  . Tetanus Vaccine  01/22/1953  . Mammogram  04/08/2018  . Flu Shot  Completed  . DEXA scan (bone density measurement)  Completed  . Pneumonia vaccines  Completed

## 2017-08-12 NOTE — Telephone Encounter (Signed)
Per our records, patient had flu shot on 03/11/2017

## 2017-08-12 NOTE — Progress Notes (Addendum)
Subjective:   Deborah Baxter is a 84 y.o. female who presents for Medicare Annual (Subsequent) preventive examination.  Review of Systems:  No ROS.  Medicare Wellness Visit. Additional risk factors are reflected in the social history.  Cardiac Risk Factors include: advanced age (>47men, >16 women)     Objective:     Vitals: BP 120/80 (BP Location: Left Arm, Patient Position: Sitting, Cuff Size: Normal)   Pulse 69   Temp 97.7 F (36.5 C) (Oral)   Resp 15   Ht 5\' 7"  (1.702 m)   Wt 157 lb 12.8 oz (71.6 kg)   LMP 06/26/1985   SpO2 94%   BMI 24.71 kg/m   Body mass index is 24.71 kg/m.  Advanced Directives 08/12/2017 08/02/2016 08/02/2015 03/08/2015  Does Patient Have a Medical Advance Directive? Yes Yes Yes No;Yes  Type of Advance Directive Healthcare Power of Attorney Living will;Healthcare Power of [place];Living will [place]  Does patient want to make changes to medical advance directive? No - Patient declined - - No - Patient declined  Copy of [place] in Chart? No - copy requested No - copy requested No - copy requested No - copy requested    Tobacco Social History   Tobacco Use  Smoking Status Former Smoker  Smokeless Tobacco Never Used     Counseling given: Not Answered   Clinical Intake:  Pre-visit preparation completed: Yes  Pain : No/denies pain     Diabetes: No  How often do you need to have someone help you when you read instructions, pamphlets, or other written materials from your doctor or pharmacy?: 1 - Never  Interpreter Needed?: No     Past Medical History:  Diagnosis Date  . Hx of degenerative disc disease   . Hypercholesterolemia   . Neuromuscular disorder ([place])    pinched nerve  . Osteoarthritis   . Seasonal allergies   . Spinal stenosis    chronic back and leg pain   Past Surgical History:  Procedure Laterality Date  . APPENDECTOMY    . bladder tack surgery  2000   . BTL  1976  . CARPAL TUNNEL RELEASE    . CATARACT EXTRACTION W/PHACO Left 12/07/2015   Procedure: CATARACT EXTRACTION PHACO AND INTRAOCULAR LENS PLACEMENT (IOC);  Surgeon: Eulogio Bear, MD;  Location: [facility] ORS;  Service: Ophthalmology;  Laterality: Left;  [place] 1.23 AP% 23.2 CDE 19.28 Fluid Pack Lot # [id number]  . EYE SURGERY  03/19/15  . GANGLION CYST EXCISION     right hand  . GAS/FLUID EXCHANGE Left 03/08/2015   Procedure: GAS/FLUID EXCHANGE;  Surgeon: Milus Height, MD;  Location: [facility] OR[facility];  Service: Ophthalmology;  Laterality: Left;  . TONSILLECTOMY    . TUBAL LIGATION    . VITRECTOMY 25 GAUGE WITH SCLERAL BUCKLE Left 03/08/2015   Procedure: VITRECTOMY 25 GAUGE, membrane peel, 26 %  SF6 gas exchange;  Surgeon: Milus Height, MD;  Location: [facility] ORS;  Service: Ophthalmology;  Laterality: Left;  casette lot # [id number]   Family History  Problem Relation Age of Onset  . Heart disease Father   . Leukemia Father   . Diabetes Mother   . Heart disease Mother        myocardial infarction  . Breast cancer Sister        in her 54's  . Cancer Sister        lung cancer  . Breast cancer Maternal Aunt   .  Colon cancer Neg Hx    Social History   Socioeconomic History  . Marital status: Widowed    Spouse name: None  . Number of children: 5  . Years of education: None  . Highest education level: None  Social Needs  . Financial resource strain: None  . Food insecurity - worry: None  . Food insecurity - inability: None  . Transportation needs - medical: None  . Transportation needs - non-medical: None  Occupational History  . None  Tobacco Use  . Smoking status: Former Research scientist (life sciences)  . Smokeless tobacco: Never Used  Substance and Sexual Activity  . Alcohol use: Yes    Alcohol/week: 0.0 oz    Comment: occassional  . Drug use: No  . Sexual activity: No  Other Topics Concern  . None  Social History Narrative  . None    Outpatient Encounter Medications as of 08/12/2017    Medication Sig  . Calcium Carbonate-Vitamin D (CALCIUM 500 + D PO) Take 1 tablet by mouth 2 (two) times daily.   . Multiple Vitamin (MULTIVITAMIN) tablet Take 1 tablet by mouth daily.  Marland Kitchen OVER THE COUNTER MEDICATION Place 1 drop into both eyes every 8 (eight) hours as needed (dry eyes.). Lubricant eye drops  . Polyethylene Glycol 3350 (MIRALAX PO) Take 17 g by mouth every 3 (three) days. Every 3 days  . acetaminophen (TYLENOL) 650 MG CR tablet Take 650 mg by mouth every 8 (eight) hours as needed for pain. Reported on 08/02/2015  . ibuprofen (ADVIL,MOTRIN) 200 MG tablet Take 400 mg by mouth every 6 (six) hours as needed for mild pain. Reported on 08/02/2015   No facility-administered encounter medications on file as of 08/12/2017.     Activities of Daily Living In your present state of health, do you have any difficulty performing the following activities: 08/12/2017  Hearing? N  Vision? N  Difficulty concentrating or making decisions? N  Walking or climbing stairs? N  Dressing or bathing? N  Doing errands, shopping? N  Preparing Food and eating ? N  Using the Toilet? N  In the past six months, have you accidently leaked urine? Y  Comment Managed with a daily pad  Do you have problems with loss of bowel control? N  Managing your Medications? N  Managing your Finances? N  Housekeeping or managing your Housekeeping? N  Some recent data might be hidden    Patient Care Team: Einar Pheasant, MD as PCP - General (Internal Medicine)    Assessment:   This is a routine wellness examination for Deborah Baxter.  The goal of the wellness visit is to assist the patient how to close the gaps in care and create a preventative care plan for the patient.   The roster of all physicians providing medical care to patient is listed in the Snapshot section of the chart.  Taking calcium as appropriate/Osteoporosis risk reviewed.    Safety issues reviewed; Smoke and carbon monoxide detectors in the home. No  firearms in the home. Wears seatbelts when driving or riding with others. No violence in the home.  They do not have excessive sun exposure.  Discussed the need for sun protection: hats, long sleeves and the use of sunscreen if there is significant sun exposure.  Patient is alert, normal appearance, oriented to person/place/and time. Correctly identified the president of the [place] and recalls of 3/3 words.  Performs simple calculations and can read correct time from watch face. Displays appropriate judgement.  No new identified risk  were noted.  No failures at ADL's or IADL's.    BMI- discussed the importance of a healthy diet, water intake and the benefits of aerobic exercise. Educational material provided.   24 hour diet recall: Regular diet  Daily fluid intake: 6 cups of caffeine, 1-2 cups of water, 2 cups of juice/milk.  Dental- every 6 months.  Eye- Visual acuity not assessed per patient preference since they have regular follow up with the ophthalmologist.  Wears corrective lenses.  Sleep patterns- Sleeps 6-8 hours at night.  Wakes feeling rested.  TDAP vaccine deferred per patient preference.  Follow up with insurance.  Educational material provided.  Fasting labs completed today, per patient request.  Follow up next week.    Exercise Activities and Dietary recommendations Current Exercise Habits: Home exercise routine, Type of exercise: walking, Time (Minutes): 20, Frequency (Times/Week): 4, Weekly Exercise (Minutes/Week): 80, Intensity: Mild  Goals    . Maintain Healthy Lifestyle     Exercise Stay hydrated Low cholesterol foods       Fall Risk Fall Risk  08/12/2017 08/02/2016 02/14/2016 08/02/2015 07/12/2014  Falls in the past year? No No No No No   Depression Screen PHQ 2/9 Scores 08/12/2017 08/02/2016 02/14/2016 08/02/2015  PHQ - 2 Score 0 0 0 0     Cognitive Function MMSE - Mini Mental State Exam 08/12/2017 08/02/2016 08/02/2015  Orientation to time 5 5 5   Orientation to  Place 5 5 5   Registration 3 3 3   Attention/ Calculation 5 5 5   Recall 3 3 3   Language- name 2 objects 2 2 2   Language- repeat 1 1 1   Language- follow 3 step command 3 3 3   Language- read & follow direction 1 1 1   Write a sentence 1 1 1   Copy design 1 1 1   Total score 30 30 30         Immunization History  Administered Date(s) Administered  . Influenza Split 02/15/2014  . Influenza-Unspecified 02/20/2015, 03/05/2016, 03/11/2017  . Pneumococcal Conjugate-13 07/08/2013  . Pneumococcal Polysaccharide-23 07/12/2014   Screening Tests Health Maintenance  Topic Date Due  . TETANUS/TDAP  01/22/1953  . MAMMOGRAM  04/08/2018  . INFLUENZA VACCINE  Completed  . DEXA SCAN  Completed  . PNA vac Low Risk Adult  Completed       Plan:    End of life planning; Advance aging; Advanced directives discussed. Copy of current HCPOA/Living Will requested.    I have personally reviewed and noted the following in the patient's chart:   . Medical and social history . Use of alcohol, tobacco or illicit drugs  . Current medications and supplements . Functional ability and status . Nutritional status . Physical activity . Advanced directives . List of other physicians . Hospitalizations, surgeries, and ER visits in previous 12 months . Vitals . Screenings to include cognitive, depression, and falls . Referrals and appointments  In addition, I have reviewed and discussed with patient certain preventive protocols, quality metrics, and best practice recommendations. A written personalized care plan for preventive services as well as general preventive health recommendations were provided to patient.     Varney Biles, LPN  7/37/1062   Reviewed above information.  Agree with assessment and plan.    Dr Nicki Reaper

## 2017-08-18 ENCOUNTER — Encounter: Payer: Self-pay | Admitting: Internal Medicine

## 2017-08-18 ENCOUNTER — Ambulatory Visit (INDEPENDENT_AMBULATORY_CARE_PROVIDER_SITE_OTHER): Payer: Medicare Other | Admitting: Internal Medicine

## 2017-08-18 VITALS — BP 140/80 | HR 81 | Temp 97.6°F | Resp 18 | Wt 158.4 lb

## 2017-08-18 DIAGNOSIS — E7800 Pure hypercholesterolemia, unspecified: Secondary | ICD-10-CM

## 2017-08-18 DIAGNOSIS — R030 Elevated blood-pressure reading, without diagnosis of hypertension: Secondary | ICD-10-CM

## 2017-08-18 DIAGNOSIS — R200 Anesthesia of skin: Secondary | ICD-10-CM

## 2017-08-18 DIAGNOSIS — R202 Paresthesia of skin: Secondary | ICD-10-CM | POA: Diagnosis not present

## 2017-08-18 NOTE — Progress Notes (Signed)
Patient ID: Deborah Baxter, female   DOB: 04/09/1934, 84 y.o.   MRN: [id number]   Subjective:    Patient ID: Deborah Baxter, female    DOB: 1933-12-29, 84 y.o.   MRN: [id number]  HPI  Patient here for a scheduled follow up.  Last evaluated 02/2016.  States she has been doing relatively well.  Trying to stay active.  No chest pain.  No sob.  No acid reflux.  No abdominal pain.  Bowels moving.  Some back issues.  Overall stable. She is having problems with left hand tingling.  She notices when driving - clutches steering wheel tight.  Also notices with sleep.  Once up and moving, goes away.  No weakness.  No other neurological deficits.  Discussed lab results and calculated cholesterol risk.  Discussed starting a statin medication.     Past Medical History:  Diagnosis Date  . Hx of degenerative disc disease   . Hypercholesterolemia   . Neuromuscular disorder ([place])    pinched nerve  . Osteoarthritis   . Seasonal allergies   . Spinal stenosis    chronic back and leg pain   Past Surgical History:  Procedure Laterality Date  . APPENDECTOMY    . bladder tack surgery  2000  . BTL  1976  . CARPAL TUNNEL RELEASE    . CATARACT EXTRACTION W/PHACO Left 12/07/2015   Procedure: CATARACT EXTRACTION PHACO AND INTRAOCULAR LENS PLACEMENT (IOC);  Surgeon: Eulogio Bear, MD;  Location: [facility];  Service: Ophthalmology;  Laterality: Left;  [place] 1.23 AP% 23.2 CDE 19.28 Fluid Pack Lot # [id number]  . EYE SURGERY  03/19/15  . GANGLION CYST EXCISION     right hand  . GAS/FLUID EXCHANGE Left 03/08/2015   Procedure: GAS/FLUID EXCHANGE;  Surgeon: Milus Height, MD;  Location: [facility];  Service: Ophthalmology;  Laterality: Left;  . TONSILLECTOMY    . TUBAL LIGATION    . VITRECTOMY 25 GAUGE WITH SCLERAL BUCKLE Left 03/08/2015   Procedure: VITRECTOMY 25 GAUGE, membrane peel, 26 %  SF6 gas exchange;  Surgeon: Milus Height, MD;  Location: [facility];  Service: Ophthalmology;  Laterality: Left;  casette lot #  [id number]   Family History  Problem Relation Age of Onset  . Heart disease Father   . Leukemia Father   . Diabetes Mother   . Heart disease Mother        myocardial infarction  . Breast cancer Sister        in her 31's  . Cancer Sister        lung cancer  . Breast cancer Maternal Aunt   . Colon cancer Neg Hx    Social History   Socioeconomic History  . Marital status: Widowed    Spouse name: Not on file  . Number of children: 5  . Years of education: Not on file  . Highest education level: Not on file  Occupational History  . Not on file  Social Needs  . Financial resource strain: Not on file  . Food insecurity:    Worry: Not on file    Inability: Not on file  . Transportation needs:    Medical: Not on file    Non-medical: Not on file  Tobacco Use  . Smoking status: Former Research scientist (life sciences)  . Smokeless tobacco: Never Used  Substance and Sexual Activity  . Alcohol use: Yes    Alcohol/week: 0.0 oz    Comment: occassional  . Drug use: No  . Sexual activity: Never  Lifestyle  . Physical activity:    Days per week: Not on file    Minutes per session: Not on file  . Stress: Not on file  Relationships  . Social connections:    Talks on phone: Not on file    Gets together: Not on file    Attends religious service: Not on file    Active member of club or organization: Not on file    Attends meetings of clubs or organizations: Not on file    Relationship status: Not on file  Other Topics Concern  . Not on file  Social History Narrative  . Not on file    Outpatient Encounter Medications as of 08/18/2017  Medication Sig  . acetaminophen (TYLENOL) 650 MG CR tablet Take 650 mg by mouth every 8 (eight) hours as needed for pain. Reported on 08/02/2015  . Calcium Carbonate-Vitamin D (CALCIUM 500 + D PO) Take 1 tablet by mouth 2 (two) times daily.   Marland Kitchen ibuprofen (ADVIL,MOTRIN) 200 MG tablet Take 400 mg by mouth every 6 (six) hours as needed for mild pain. Reported on 08/02/2015  .  Multiple Vitamin (MULTIVITAMIN) tablet Take 1 tablet by mouth daily.  Marland Kitchen OVER THE COUNTER MEDICATION Place 1 drop into both eyes every 8 (eight) hours as needed (dry eyes.). Lubricant eye drops  . Polyethylene Glycol 3350 (MIRALAX PO) Take 17 g by mouth every 3 (three) days. Every 3 days   No facility-administered encounter medications on file as of 08/18/2017.     Review of Systems  Constitutional: Negative for appetite change and unexpected weight change.  HENT: Negative for congestion and sinus pressure.   Respiratory: Negative for cough, chest tightness and shortness of breath.   Cardiovascular: Negative for chest pain, palpitations and leg swelling.  Gastrointestinal: Negative for abdominal pain, diarrhea, nausea and vomiting.  Genitourinary: Negative for difficulty urinating and dysuria.  Musculoskeletal: Negative for joint swelling and myalgias.  Skin: Negative for color change and rash.  Neurological: Negative for dizziness, light-headedness and headaches.       Left hand tingling as outlined.    Psychiatric/Behavioral: Negative for agitation and dysphoric mood.       Objective:    Physical Exam  Constitutional: She appears well-developed and well-nourished. No distress.  HENT:  Nose: Nose normal.  Mouth/Throat: Oropharynx is clear and moist.  Neck: Neck supple. No thyromegaly present.  Cardiovascular: Normal rate and regular rhythm.  Pulmonary/Chest: Breath sounds normal. No respiratory distress. She has no wheezes.  Abdominal: Soft. Bowel sounds are normal. There is no tenderness.  Musculoskeletal: She exhibits no edema or tenderness.  Negative phalens and negative tinels.  Grip strength normal.    Lymphadenopathy:    She has no cervical adenopathy.  Skin: No rash noted. No erythema.  Psychiatric: She has a normal mood and affect. Her behavior is normal.    BP 140/80 (BP Location: Left Arm, Patient Position: Sitting, Cuff Size: Normal)   Pulse 81   Temp 97.6 F  (36.4 C) (Oral)   Resp 18   Wt 158 lb 6.4 oz (71.8 kg)   LMP 06/26/1985   SpO2 95%   BMI 24.81 kg/m  Wt Readings from Last 3 Encounters:  08/18/17 158 lb 6.4 oz (71.8 kg)  08/12/17 157 lb 12.8 oz (71.6 kg)  08/02/16 161 lb 1.9 oz (73.1 kg)     Lab Results  Component Value Date   WBC 4.4 08/12/2017   HGB 13.6 08/12/2017   HCT 40.5 08/12/2017  PLT 240.0 08/12/2017   GLUCOSE 97 08/12/2017   CHOL 224 (H) 08/12/2017   TRIG 63.0 08/12/2017   HDL 93.70 08/12/2017   LDLDIRECT 104.0 08/12/2017   LDLCALC 118 (H) 08/12/2017   ALT 20 08/12/2017   AST 18 08/12/2017   NA 139 08/12/2017   K 5.0 08/12/2017   CL 103 08/12/2017   CREATININE 0.64 08/12/2017   BUN 15 08/12/2017   CO2 32 08/12/2017   TSH 3.16 08/12/2017    Mm Screening Breast Tomo Bilateral  Result Date: 04/08/2017 CLINICAL DATA:  Screening. EXAM: 2D DIGITAL SCREENING BILATERAL MAMMOGRAM WITH CAD AND ADJUNCT TOMO COMPARISON:  Previous exam(s). ACR Breast Density Category b: There are scattered areas of fibroglandular density. FINDINGS: There are no findings suspicious for malignancy. Images were processed with CAD. IMPRESSION: No mammographic evidence of malignancy. A result letter of this screening mammogram will be mailed directly to the patient. RECOMMENDATION: Screening mammogram in one year. (Code:SM-B-01Y) BI-RADS CATEGORY  1: Negative. Electronically Signed   By: Abelardo Diesel M.D.   On: 04/08/2017 12:26       Assessment & Plan:   Problem List Items Addressed This Visit    Hypercholesterolemia    Low cholesterol diet and exercise.  Discussed calculated cholesterol risk and discussed starting a cholesterol medication.  She declines.  Follow lipid panel.        Numbness and tingling in left hand    Symptoms as outlined.  Appears to be c/w CTS.  Trial of cock up splint.  Discussed nerve conduction tests.  Follow.         Other Visit Diagnoses    Elevated blood pressure reading    -  Primary   slight  elevation today.  have her spot check her pressure.  get her back in soon to reassess.         Einar Pheasant, MD

## 2017-08-18 NOTE — Patient Instructions (Signed)
Cock up wrist splint - left

## 2017-08-21 ENCOUNTER — Encounter: Payer: Self-pay | Admitting: Internal Medicine

## 2017-08-21 DIAGNOSIS — R202 Paresthesia of skin: Secondary | ICD-10-CM

## 2017-08-21 DIAGNOSIS — R200 Anesthesia of skin: Secondary | ICD-10-CM | POA: Insufficient documentation

## 2017-08-21 HISTORY — DX: Anesthesia of skin: R20.0

## 2017-08-21 HISTORY — DX: Paresthesia of skin: R20.2

## 2017-08-21 NOTE — Assessment & Plan Note (Signed)
Low cholesterol diet and exercise.  Discussed calculated cholesterol risk and discussed starting a cholesterol medication.  She declines.  Follow lipid panel.

## 2017-08-21 NOTE — Assessment & Plan Note (Signed)
Symptoms as outlined.  Appears to be c/w CTS.  Trial of cock up splint.  Discussed nerve conduction tests.  Follow.

## 2017-08-26 DIAGNOSIS — M5134 Other intervertebral disc degeneration, thoracic region: Secondary | ICD-10-CM | POA: Diagnosis not present

## 2017-08-26 DIAGNOSIS — M9902 Segmental and somatic dysfunction of thoracic region: Secondary | ICD-10-CM | POA: Diagnosis not present

## 2017-08-26 DIAGNOSIS — M5416 Radiculopathy, lumbar region: Secondary | ICD-10-CM | POA: Diagnosis not present

## 2017-08-26 DIAGNOSIS — M9903 Segmental and somatic dysfunction of lumbar region: Secondary | ICD-10-CM | POA: Diagnosis not present

## 2017-09-23 DIAGNOSIS — M5416 Radiculopathy, lumbar region: Secondary | ICD-10-CM | POA: Diagnosis not present

## 2017-09-23 DIAGNOSIS — M9902 Segmental and somatic dysfunction of thoracic region: Secondary | ICD-10-CM | POA: Diagnosis not present

## 2017-09-23 DIAGNOSIS — M5134 Other intervertebral disc degeneration, thoracic region: Secondary | ICD-10-CM | POA: Diagnosis not present

## 2017-09-23 DIAGNOSIS — M9903 Segmental and somatic dysfunction of lumbar region: Secondary | ICD-10-CM | POA: Diagnosis not present

## 2017-10-13 DIAGNOSIS — H2511 Age-related nuclear cataract, right eye: Secondary | ICD-10-CM | POA: Diagnosis not present

## 2017-10-13 DIAGNOSIS — H35342 Macular cyst, hole, or pseudohole, left eye: Secondary | ICD-10-CM | POA: Diagnosis not present

## 2017-10-21 ENCOUNTER — Encounter: Payer: Self-pay | Admitting: Internal Medicine

## 2017-10-21 ENCOUNTER — Ambulatory Visit (INDEPENDENT_AMBULATORY_CARE_PROVIDER_SITE_OTHER): Payer: Medicare Other | Admitting: Internal Medicine

## 2017-10-21 VITALS — BP 144/86 | HR 77 | Temp 98.1°F | Resp 16 | Wt 156.4 lb

## 2017-10-21 DIAGNOSIS — R202 Paresthesia of skin: Secondary | ICD-10-CM | POA: Diagnosis not present

## 2017-10-21 DIAGNOSIS — E7800 Pure hypercholesterolemia, unspecified: Secondary | ICD-10-CM

## 2017-10-21 DIAGNOSIS — R030 Elevated blood-pressure reading, without diagnosis of hypertension: Secondary | ICD-10-CM | POA: Diagnosis not present

## 2017-10-21 DIAGNOSIS — R200 Anesthesia of skin: Secondary | ICD-10-CM | POA: Diagnosis not present

## 2017-10-21 DIAGNOSIS — M4807 Spinal stenosis, lumbosacral region: Secondary | ICD-10-CM

## 2017-10-21 NOTE — Progress Notes (Signed)
Patient ID: Deborah Baxter, female   DOB: 1934/02/12, 84 y.o.   MRN: [id number]   Subjective:    Patient ID: Deborah Baxter, female    DOB: July 27, 1933, 84 y.o.   MRN: [id number]  HPI  Patient here for a scheduled follow up.  She reports she is doing relatively well.  Staying active.  No chest pain.  No sob.  No acid reflux.  No abdominal pain.  Bowels moving.  Previous hand tingling.  Better.  She does reprot some pain in her right lower buttock.  Some occasional tingling discomfort.  No severe pain.  Takes tylenol.  Also sees chiropractor and has acupuncture.  Feels this controls things pretty well.  Does not feel needs any further intervention or evaluation.  Blood pressure is doing well.    Past Medical History:  Diagnosis Date  . Hx of degenerative disc disease   . Hypercholesterolemia   . Neuromuscular disorder ([place])    pinched nerve  . Osteoarthritis   . Seasonal allergies   . Spinal stenosis    chronic back and leg pain   Past Surgical History:  Procedure Laterality Date  . APPENDECTOMY    . bladder tack surgery  2000  . BTL  1976  . CARPAL TUNNEL RELEASE    . CATARACT EXTRACTION W/PHACO Left 12/07/2015   Procedure: CATARACT EXTRACTION PHACO AND INTRAOCULAR LENS PLACEMENT (IOC);  Surgeon: Eulogio Bear, MD;  Location: [facility];  Service: Ophthalmology;  Laterality: Left;  [place] 1.23 AP% 23.2 CDE 19.28 Fluid Pack Lot # [id number]  . EYE SURGERY  03/19/15  . GANGLION CYST EXCISION     right hand  . GAS/FLUID EXCHANGE Left 03/08/2015   Procedure: GAS/FLUID EXCHANGE;  Surgeon: Milus Height, MD;  Location: [facility];  Service: Ophthalmology;  Laterality: Left;  . TONSILLECTOMY    . TUBAL LIGATION    . VITRECTOMY 25 GAUGE WITH SCLERAL BUCKLE Left 03/08/2015   Procedure: VITRECTOMY 25 GAUGE, membrane peel, 26 %  SF6 gas exchange;  Surgeon: Milus Height, MD;  Location: [facility];  Service: Ophthalmology;  Laterality: Left;  casette lot # [id number]   Family History  Problem  Relation Age of Onset  . Heart disease Father   . Leukemia Father   . Diabetes Mother   . Heart disease Mother        myocardial infarction  . Breast cancer Sister        in her 72's  . Cancer Sister        lung cancer  . Breast cancer Maternal Aunt   . Colon cancer Neg Hx    Social History   Socioeconomic History  . Marital status: Widowed    Spouse name: Not on file  . Number of children: 5  . Years of education: Not on file  . Highest education level: Not on file  Occupational History  . Not on file  Social Needs  . Financial resource strain: Not on file  . Food insecurity:    Worry: Not on file    Inability: Not on file  . Transportation needs:    Medical: Not on file    Non-medical: Not on file  Tobacco Use  . Smoking status: Former Research scientist (life sciences)  . Smokeless tobacco: Never Used  Substance and Sexual Activity  . Alcohol use: Yes    Alcohol/week: 0.0 oz    Comment: occassional  . Drug use: No  . Sexual activity: Never  Lifestyle  . Physical activity:  Days per week: Not on file    Minutes per session: Not on file  . Stress: Not on file  Relationships  . Social connections:    Talks on phone: Not on file    Gets together: Not on file    Attends religious service: Not on file    Active member of club or organization: Not on file    Attends meetings of clubs or organizations: Not on file    Relationship status: Not on file  Other Topics Concern  . Not on file  Social History Narrative  . Not on file    Outpatient Encounter Medications as of 10/21/2017  Medication Sig  . acetaminophen (TYLENOL) 650 MG CR tablet Take 650 mg by mouth every 8 (eight) hours as needed for pain. Reported on 08/02/2015  . Calcium Carbonate-Vitamin D (CALCIUM 500 + D PO) Take 1 tablet by mouth 2 (two) times daily.   Marland Kitchen ibuprofen (ADVIL,MOTRIN) 200 MG tablet Take 400 mg by mouth every 6 (six) hours as needed for mild pain. Reported on 08/02/2015  . Multiple Vitamin (MULTIVITAMIN) tablet  Take 1 tablet by mouth daily.  Marland Kitchen OVER THE COUNTER MEDICATION Place 1 drop into both eyes every 8 (eight) hours as needed (dry eyes.). Lubricant eye drops  . Polyethylene Glycol 3350 (MIRALAX PO) Take 17 g by mouth every 3 (three) days. Every 3 days   No facility-administered encounter medications on file as of 10/21/2017.     Review of Systems  Constitutional: Negative for appetite change and unexpected weight change.  HENT: Negative for congestion and sinus pressure.   Respiratory: Negative for cough, chest tightness and shortness of breath.   Cardiovascular: Negative for chest pain, palpitations and leg swelling.  Gastrointestinal: Negative for abdominal pain, diarrhea and nausea.  Genitourinary: Negative for difficulty urinating and dysuria.  Musculoskeletal: Negative for joint swelling and myalgias.       Discomfort in her lower buttock as outlined.    Skin: Negative for color change and rash.  Neurological: Negative for dizziness, light-headedness and headaches.  Psychiatric/Behavioral: Negative for agitation and dysphoric mood.       Objective:     Blood pressure rechecked by me:  122/74  Physical Exam  Constitutional: She appears well-developed and well-nourished. No distress.  HENT:  Nose: Nose normal.  Mouth/Throat: Oropharynx is clear and moist.  Neck: Neck supple. No thyromegaly present.  Cardiovascular: Normal rate and regular rhythm.  Pulmonary/Chest: Breath sounds normal. No respiratory distress. She has no wheezes.  Abdominal: Soft. Bowel sounds are normal. There is no tenderness.  Musculoskeletal: She exhibits no edema or tenderness.  Lymphadenopathy:    She has no cervical adenopathy.  Skin: No rash noted. No erythema.  Psychiatric: She has a normal mood and affect. Her behavior is normal.    BP (!) 144/86 (BP Location: Left Arm, Patient Position: Sitting, Cuff Size: Normal)   Pulse 77   Temp 98.1 F (36.7 C) (Oral)   Resp 16   Wt 156 lb 6.4 oz (70.9 kg)    LMP 06/26/1985   SpO2 95%   BMI 24.50 kg/m  Wt Readings from Last 3 Encounters:  10/21/17 156 lb 6.4 oz (70.9 kg)  08/18/17 158 lb 6.4 oz (71.8 kg)  08/12/17 157 lb 12.8 oz (71.6 kg)     Lab Results  Component Value Date   WBC 4.4 08/12/2017   HGB 13.6 08/12/2017   HCT 40.5 08/12/2017   PLT 240.0 08/12/2017   GLUCOSE 97 08/12/2017  CHOL 224 (H) 08/12/2017   TRIG 63.0 08/12/2017   HDL 93.70 08/12/2017   LDLDIRECT 104.0 08/12/2017   LDLCALC 118 (H) 08/12/2017   ALT 20 08/12/2017   AST 18 08/12/2017   NA 139 08/12/2017   K 5.0 08/12/2017   CL 103 08/12/2017   CREATININE 0.64 08/12/2017   BUN 15 08/12/2017   CO2 32 08/12/2017   TSH 3.16 08/12/2017    Mm Screening Breast Tomo Bilateral  Result Date: 04/08/2017 CLINICAL DATA:  Screening. EXAM: 2D DIGITAL SCREENING BILATERAL MAMMOGRAM WITH CAD AND ADJUNCT TOMO COMPARISON:  Previous exam(s). ACR Breast Density Category b: There are scattered areas of fibroglandular density. FINDINGS: There are no findings suspicious for malignancy. Images were processed with CAD. IMPRESSION: No mammographic evidence of malignancy. A result letter of this screening mammogram will be mailed directly to the patient. RECOMMENDATION: Screening mammogram in one year. (Code:SM-B-01Y) BI-RADS CATEGORY  1: Negative. Electronically Signed   By: Abelardo Diesel M.D.   On: 04/08/2017 12:26       Assessment & Plan:   Problem List Items Addressed This Visit    Hypercholesterolemia    Low cholesterol diet and exercise.  Follow lipid panel.        Relevant Orders   Comprehensive metabolic panel   Lipid panel   Numbness and tingling in left hand    Doing better.  Desires no further intervention.  Follow.        Spinal stenosis    Has persistent chronic back pain as outlined.  Tylenol prn.  Acupuncture.  Feels stable.  Desires no further intervention.  Follow.         Other Visit Diagnoses    Elevated blood pressure reading    -  Primary    previous elevated blood pressure.  blood pressure looks good.  follow.         Einar Pheasant, MD

## 2017-10-27 ENCOUNTER — Encounter: Payer: Self-pay | Admitting: Internal Medicine

## 2017-10-27 NOTE — Assessment & Plan Note (Signed)
Has persistent chronic back pain as outlined.  Tylenol prn.  Acupuncture.  Feels stable.  Desires no further intervention.  Follow.

## 2017-10-27 NOTE — Assessment & Plan Note (Signed)
Low cholesterol diet and exercise.  Follow lipid panel.   

## 2017-10-27 NOTE — Assessment & Plan Note (Signed)
Doing better.  Desires no further intervention.  Follow.

## 2017-10-28 DIAGNOSIS — M5134 Other intervertebral disc degeneration, thoracic region: Secondary | ICD-10-CM | POA: Diagnosis not present

## 2017-10-28 DIAGNOSIS — M9903 Segmental and somatic dysfunction of lumbar region: Secondary | ICD-10-CM | POA: Diagnosis not present

## 2017-10-28 DIAGNOSIS — M5416 Radiculopathy, lumbar region: Secondary | ICD-10-CM | POA: Diagnosis not present

## 2017-10-28 DIAGNOSIS — M9902 Segmental and somatic dysfunction of thoracic region: Secondary | ICD-10-CM | POA: Diagnosis not present

## 2017-11-25 DIAGNOSIS — M9903 Segmental and somatic dysfunction of lumbar region: Secondary | ICD-10-CM | POA: Diagnosis not present

## 2017-11-25 DIAGNOSIS — M9902 Segmental and somatic dysfunction of thoracic region: Secondary | ICD-10-CM | POA: Diagnosis not present

## 2017-11-25 DIAGNOSIS — M5134 Other intervertebral disc degeneration, thoracic region: Secondary | ICD-10-CM | POA: Diagnosis not present

## 2017-11-25 DIAGNOSIS — M5416 Radiculopathy, lumbar region: Secondary | ICD-10-CM | POA: Diagnosis not present

## 2017-12-23 DIAGNOSIS — M9903 Segmental and somatic dysfunction of lumbar region: Secondary | ICD-10-CM | POA: Diagnosis not present

## 2017-12-23 DIAGNOSIS — M5416 Radiculopathy, lumbar region: Secondary | ICD-10-CM | POA: Diagnosis not present

## 2017-12-23 DIAGNOSIS — M5134 Other intervertebral disc degeneration, thoracic region: Secondary | ICD-10-CM | POA: Diagnosis not present

## 2017-12-23 DIAGNOSIS — M9902 Segmental and somatic dysfunction of thoracic region: Secondary | ICD-10-CM | POA: Diagnosis not present

## 2018-01-14 DIAGNOSIS — M5416 Radiculopathy, lumbar region: Secondary | ICD-10-CM | POA: Diagnosis not present

## 2018-01-14 DIAGNOSIS — M9902 Segmental and somatic dysfunction of thoracic region: Secondary | ICD-10-CM | POA: Diagnosis not present

## 2018-01-14 DIAGNOSIS — M9903 Segmental and somatic dysfunction of lumbar region: Secondary | ICD-10-CM | POA: Diagnosis not present

## 2018-01-14 DIAGNOSIS — M5134 Other intervertebral disc degeneration, thoracic region: Secondary | ICD-10-CM | POA: Diagnosis not present

## 2018-02-11 DIAGNOSIS — M9902 Segmental and somatic dysfunction of thoracic region: Secondary | ICD-10-CM | POA: Diagnosis not present

## 2018-02-11 DIAGNOSIS — M5416 Radiculopathy, lumbar region: Secondary | ICD-10-CM | POA: Diagnosis not present

## 2018-02-11 DIAGNOSIS — M5134 Other intervertebral disc degeneration, thoracic region: Secondary | ICD-10-CM | POA: Diagnosis not present

## 2018-02-11 DIAGNOSIS — M9903 Segmental and somatic dysfunction of lumbar region: Secondary | ICD-10-CM | POA: Diagnosis not present

## 2018-03-03 ENCOUNTER — Other Ambulatory Visit (INDEPENDENT_AMBULATORY_CARE_PROVIDER_SITE_OTHER): Payer: Medicare Other

## 2018-03-03 DIAGNOSIS — E7800 Pure hypercholesterolemia, unspecified: Secondary | ICD-10-CM | POA: Diagnosis not present

## 2018-03-03 LAB — COMPREHENSIVE METABOLIC PANEL
ALT: 14 U/L (ref 0–35)
AST: 18 U/L (ref 0–37)
Albumin: 4 g/dL (ref 3.5–5.2)
Alkaline Phosphatase: 38 U/L — ABNORMAL LOW (ref 39–117)
BILIRUBIN TOTAL: 0.6 mg/dL (ref 0.2–1.2)
BUN: 14 mg/dL (ref 6–23)
CALCIUM: 9.7 mg/dL (ref 8.4–10.5)
CHLORIDE: 104 meq/L (ref 96–112)
CO2: 32 meq/L (ref 19–32)
Creatinine, Ser: 0.83 mg/dL (ref 0.40–1.20)
GFR: 69.93 mL/min (ref >60.00)
Glucose, Bld: 93 mg/dL (ref 70–99)
Potassium: 4.9 mEq/L (ref 3.5–5.1)
Sodium: 141 mEq/L (ref 135–145)
Total Protein: 6.7 g/dL (ref 6.0–8.3)

## 2018-03-03 LAB — LIPID PANEL
CHOL/HDL RATIO: 3
Cholesterol: 204 mg/dL — ABNORMAL HIGH (ref 0–200)
HDL: 72.7 mg/dL (ref >39.00)
LDL CALC: 106 mg/dL — AB (ref 0–99)
NonHDL: 131.78
TRIGLYCERIDES: 127 mg/dL (ref 0.0–149.0)
VLDL: 25.4 mg/dL (ref 0.0–40.0)

## 2018-03-04 ENCOUNTER — Other Ambulatory Visit: Payer: Self-pay | Admitting: Internal Medicine

## 2018-03-04 DIAGNOSIS — Z1231 Encounter for screening mammogram for malignant neoplasm of breast: Secondary | ICD-10-CM

## 2018-03-05 ENCOUNTER — Encounter: Payer: Self-pay | Admitting: Internal Medicine

## 2018-03-05 ENCOUNTER — Ambulatory Visit (INDEPENDENT_AMBULATORY_CARE_PROVIDER_SITE_OTHER): Payer: Medicare Other

## 2018-03-05 ENCOUNTER — Ambulatory Visit (INDEPENDENT_AMBULATORY_CARE_PROVIDER_SITE_OTHER): Payer: Medicare Other | Admitting: Internal Medicine

## 2018-03-05 VITALS — BP 138/78 | HR 82 | Temp 97.7°F | Resp 18 | Wt 152.0 lb

## 2018-03-05 DIAGNOSIS — M545 Low back pain, unspecified: Secondary | ICD-10-CM

## 2018-03-05 DIAGNOSIS — Z23 Encounter for immunization: Secondary | ICD-10-CM | POA: Diagnosis not present

## 2018-03-05 DIAGNOSIS — E7800 Pure hypercholesterolemia, unspecified: Secondary | ICD-10-CM

## 2018-03-05 DIAGNOSIS — G8929 Other chronic pain: Secondary | ICD-10-CM | POA: Diagnosis not present

## 2018-03-05 DIAGNOSIS — M4807 Spinal stenosis, lumbosacral region: Secondary | ICD-10-CM | POA: Diagnosis not present

## 2018-03-05 DIAGNOSIS — M5136 Other intervertebral disc degeneration, lumbar region: Secondary | ICD-10-CM | POA: Diagnosis not present

## 2018-03-05 NOTE — Progress Notes (Signed)
Patient ID: Deborah Baxter, female   DOB: May 14, 1934, 84 y.o.   MRN: [id number]   Subjective:    Patient ID: Deborah Baxter, female    DOB: Mar 31, 1934, 84 y.o.   MRN: [id number]  HPI  Patient here for a scheduled follow up.  She reports she is doing relatively well.  Has noticed increased low back pain.  Has known spinal stenosis.  Has had previous xrays, scans, etc.  States bothering her more now.  Does try to stay active.  Does limit some of her activity.  No chest pain.  No sob.  No acid reflux.  No abdominal pain.  Bowels moving.  Discussed labs.  Discussed starting cholesterol medication given calculated cholesterol risk. She declines.     Past Medical History:  Diagnosis Date  . Hx of degenerative disc disease   . Hypercholesterolemia   . Neuromuscular disorder ([place])    pinched nerve  . Osteoarthritis   . Seasonal allergies   . Spinal stenosis    chronic back and leg pain   Past Surgical History:  Procedure Laterality Date  . APPENDECTOMY    . bladder tack surgery  2000  . BTL  1976  . CARPAL TUNNEL RELEASE    . CATARACT EXTRACTION W/PHACO Left 12/07/2015   Procedure: CATARACT EXTRACTION PHACO AND INTRAOCULAR LENS PLACEMENT (IOC);  Surgeon: Eulogio Bear, MD;  Location: [facility];  Service: Ophthalmology;  Laterality: Left;  [place] 1.23 AP% 23.2 CDE 19.28 Fluid Pack Lot # [id number]  . EYE SURGERY  03/19/15  . GANGLION CYST EXCISION     right hand  . GAS/FLUID EXCHANGE Left 03/08/2015   Procedure: GAS/FLUID EXCHANGE;  Surgeon: Milus Height, MD;  Location: [facility];  Service: Ophthalmology;  Laterality: Left;  . TONSILLECTOMY    . TUBAL LIGATION    . VITRECTOMY 25 GAUGE WITH SCLERAL BUCKLE Left 03/08/2015   Procedure: VITRECTOMY 25 GAUGE, membrane peel, 26 %  SF6 gas exchange;  Surgeon: Milus Height, MD;  Location: [facility];  Service: Ophthalmology;  Laterality: Left;  casette lot # [id number]   Family History  Problem Relation Age of Onset  . Heart disease Father   .  Leukemia Father   . Diabetes Mother   . Heart disease Mother        myocardial infarction  . Breast cancer Sister        in her 85's  . Cancer Sister        lung cancer  . Breast cancer Maternal Aunt   . Colon cancer Neg Hx    Social History   Socioeconomic History  . Marital status: Widowed    Spouse name: Not on file  . Number of children: 5  . Years of education: Not on file  . Highest education level: Not on file  Occupational History  . Not on file  Social Needs  . Financial resource strain: Not on file  . Food insecurity:    Worry: Not on file    Inability: Not on file  . Transportation needs:    Medical: Not on file    Non-medical: Not on file  Tobacco Use  . Smoking status: Former Research scientist (life sciences)  . Smokeless tobacco: Never Used  Substance and Sexual Activity  . Alcohol use: Yes    Alcohol/week: 0.0 standard drinks    Comment: occassional  . Drug use: No  . Sexual activity: Never  Lifestyle  . Physical activity:    Days per week: Not on file  Minutes per session: Not on file  . Stress: Not on file  Relationships  . Social connections:    Talks on phone: Not on file    Gets together: Not on file    Attends religious service: Not on file    Active member of club or organization: Not on file    Attends meetings of clubs or organizations: Not on file    Relationship status: Not on file  Other Topics Concern  . Not on file  Social History Narrative  . Not on file    Outpatient Encounter Medications as of 03/05/2018  Medication Sig  . acetaminophen (TYLENOL) 650 MG CR tablet Take 650 mg by mouth every 8 (eight) hours as needed for pain. Reported on 08/02/2015  . Calcium Carbonate-Vitamin D (CALCIUM 500 + D PO) Take 1 tablet by mouth 2 (two) times daily.   Marland Kitchen ibuprofen (ADVIL,MOTRIN) 200 MG tablet Take 400 mg by mouth every 6 (six) hours as needed for mild pain. Reported on 08/02/2015  . Multiple Vitamin (MULTIVITAMIN) tablet Take 1 tablet by mouth daily.  Marland Kitchen OVER  THE COUNTER MEDICATION Place 1 drop into both eyes every 8 (eight) hours as needed (dry eyes.). Lubricant eye drops  . Polyethylene Glycol 3350 (MIRALAX PO) Take 17 g by mouth every 3 (three) days. Every 3 days   No facility-administered encounter medications on file as of 03/05/2018.     Review of Systems  Constitutional: Negative for appetite change and unexpected weight change.  HENT: Negative for congestion and sinus pressure.   Respiratory: Negative for cough, chest tightness and shortness of breath.   Cardiovascular: Negative for chest pain, palpitations and leg swelling.  Gastrointestinal: Negative for abdominal pain, diarrhea, nausea and vomiting.  Genitourinary: Negative for difficulty urinating and dysuria.  Musculoskeletal: Positive for back pain. Negative for joint swelling.  Skin: Negative for color change and rash.  Neurological: Negative for dizziness, light-headedness and headaches.  Psychiatric/Behavioral: Negative for agitation and dysphoric mood.       Objective:    Physical Exam  Constitutional: She appears well-developed and well-nourished. No distress.  HENT:  Nose: Nose normal.  Mouth/Throat: Oropharynx is clear and moist.  Neck: Neck supple. No thyromegaly present.  Cardiovascular: Normal rate and regular rhythm.  Pulmonary/Chest: Breath sounds normal. No respiratory distress. She has no wheezes.  Abdominal: Soft. Bowel sounds are normal. There is no tenderness.  Musculoskeletal: She exhibits no edema or tenderness.  Lymphadenopathy:    She has no cervical adenopathy.  Skin: No rash noted. No erythema.  Psychiatric: She has a normal mood and affect. Her behavior is normal.    BP 138/78 (BP Location: Left Arm, Patient Position: Sitting, Cuff Size: Normal)   Pulse 82   Temp 97.7 F (36.5 C) (Oral)   Resp 18   Wt 152 lb (68.9 kg)   LMP 06/26/1985   SpO2 95%   BMI 23.81 kg/m  Wt Readings from Last 3 Encounters:  03/05/18 152 lb (68.9 kg)  10/21/17  156 lb 6.4 oz (70.9 kg)  08/18/17 158 lb 6.4 oz (71.8 kg)     Lab Results  Component Value Date   WBC 4.4 08/12/2017   HGB 13.6 08/12/2017   HCT 40.5 08/12/2017   PLT 240.0 08/12/2017   GLUCOSE 93 03/03/2018   CHOL 204 (H) 03/03/2018   TRIG 127.0 03/03/2018   HDL 72.70 03/03/2018   LDLDIRECT 104.0 08/12/2017   LDLCALC 106 (H) 03/03/2018   ALT 14 03/03/2018   AST  18 03/03/2018   NA 141 03/03/2018   K 4.9 03/03/2018   CL 104 03/03/2018   CREATININE 0.83 03/03/2018   BUN 14 03/03/2018   CO2 32 03/03/2018   TSH 3.16 08/12/2017    Mm Screening Breast Tomo Bilateral  Result Date: 04/08/2017 CLINICAL DATA:  Screening. EXAM: 2D DIGITAL SCREENING BILATERAL MAMMOGRAM WITH CAD AND ADJUNCT TOMO COMPARISON:  Previous exam(s). ACR Breast Density Category b: There are scattered areas of fibroglandular density. FINDINGS: There are no findings suspicious for malignancy. Images were processed with CAD. IMPRESSION: No mammographic evidence of malignancy. A result letter of this screening mammogram will be mailed directly to the patient. RECOMMENDATION: Screening mammogram in one year. (Code:SM-B-01Y) BI-RADS CATEGORY  1: Negative. Electronically Signed   By: Abelardo Diesel M.D.   On: 04/08/2017 12:26       Assessment & Plan:   Problem List Items Addressed This Visit    Hypercholesterolemia    Discussed calculated cholesterol risk.  Discussed starting a cholesterol medication.  She declines.  Low cholesterol diet and exercise.  Follow lipid panel.        Relevant Orders   CBC with Differential/Platelet   Comprehensive metabolic panel   TSH   Lipid panel   Lumbago - Primary   Relevant Orders   DG Lumbar Spine 2-3 Views (Completed)   Spinal stenosis    Has known spinal stenosis.  Increased low back pain.  Will check xray.  May need referral back to Dr Sharlet [place]ina.            Einar Pheasant, MD

## 2018-03-08 ENCOUNTER — Encounter: Payer: Self-pay | Admitting: Internal Medicine

## 2018-03-08 NOTE — Assessment & Plan Note (Signed)
Has known spinal stenosis.  Increased low back pain.  Will check xray.  May need referral back to Dr Sharlet Salina.

## 2018-03-08 NOTE — Assessment & Plan Note (Signed)
Discussed calculated cholesterol risk.  Discussed starting a cholesterol medication.  She declines.  Low cholesterol diet and exercise.  Follow lipid panel.

## 2018-03-11 DIAGNOSIS — M5416 Radiculopathy, lumbar region: Secondary | ICD-10-CM | POA: Diagnosis not present

## 2018-03-11 DIAGNOSIS — M9903 Segmental and somatic dysfunction of lumbar region: Secondary | ICD-10-CM | POA: Diagnosis not present

## 2018-03-11 DIAGNOSIS — M9902 Segmental and somatic dysfunction of thoracic region: Secondary | ICD-10-CM | POA: Diagnosis not present

## 2018-03-11 DIAGNOSIS — M5134 Other intervertebral disc degeneration, thoracic region: Secondary | ICD-10-CM | POA: Diagnosis not present

## 2018-04-01 ENCOUNTER — Other Ambulatory Visit: Payer: Self-pay | Admitting: Internal Medicine

## 2018-04-01 DIAGNOSIS — M4807 Spinal stenosis, lumbosacral region: Secondary | ICD-10-CM

## 2018-04-01 DIAGNOSIS — M545 Low back pain, unspecified: Secondary | ICD-10-CM

## 2018-04-01 NOTE — Progress Notes (Signed)
Order placed for referral to Dr Chasnis.   

## 2018-04-08 DIAGNOSIS — M5416 Radiculopathy, lumbar region: Secondary | ICD-10-CM | POA: Diagnosis not present

## 2018-04-08 DIAGNOSIS — M9903 Segmental and somatic dysfunction of lumbar region: Secondary | ICD-10-CM | POA: Diagnosis not present

## 2018-04-08 DIAGNOSIS — M5134 Other intervertebral disc degeneration, thoracic region: Secondary | ICD-10-CM | POA: Diagnosis not present

## 2018-04-08 DIAGNOSIS — M9902 Segmental and somatic dysfunction of thoracic region: Secondary | ICD-10-CM | POA: Diagnosis not present

## 2018-04-09 ENCOUNTER — Ambulatory Visit
Admission: RE | Admit: 2018-04-09 | Discharge: 2018-04-09 | Disposition: A | Discharge status: Home / Self Care | Payer: Medicare Other | Source: Ambulatory Visit | Attending: Internal Medicine | Admitting: Internal Medicine

## 2018-04-09 DIAGNOSIS — Z1231 Encounter for screening mammogram for malignant neoplasm of breast: Secondary | ICD-10-CM | POA: Insufficient documentation

## 2018-04-13 DIAGNOSIS — M5416 Radiculopathy, lumbar region: Secondary | ICD-10-CM | POA: Diagnosis not present

## 2018-04-13 DIAGNOSIS — M5136 Other intervertebral disc degeneration, lumbar region: Secondary | ICD-10-CM | POA: Diagnosis not present

## 2018-04-13 DIAGNOSIS — M25551 Pain in right hip: Secondary | ICD-10-CM | POA: Diagnosis not present

## 2018-05-06 DIAGNOSIS — M5134 Other intervertebral disc degeneration, thoracic region: Secondary | ICD-10-CM | POA: Diagnosis not present

## 2018-05-06 DIAGNOSIS — M9903 Segmental and somatic dysfunction of lumbar region: Secondary | ICD-10-CM | POA: Diagnosis not present

## 2018-05-06 DIAGNOSIS — M5416 Radiculopathy, lumbar region: Secondary | ICD-10-CM | POA: Diagnosis not present

## 2018-05-06 DIAGNOSIS — M9902 Segmental and somatic dysfunction of thoracic region: Secondary | ICD-10-CM | POA: Diagnosis not present

## 2018-06-01 DIAGNOSIS — M5136 Other intervertebral disc degeneration, lumbar region: Secondary | ICD-10-CM | POA: Diagnosis not present

## 2018-06-01 DIAGNOSIS — M5416 Radiculopathy, lumbar region: Secondary | ICD-10-CM | POA: Diagnosis not present

## 2018-06-01 DIAGNOSIS — M25551 Pain in right hip: Secondary | ICD-10-CM | POA: Diagnosis not present

## 2018-06-10 DIAGNOSIS — M9902 Segmental and somatic dysfunction of thoracic region: Secondary | ICD-10-CM | POA: Diagnosis not present

## 2018-06-10 DIAGNOSIS — M5416 Radiculopathy, lumbar region: Secondary | ICD-10-CM | POA: Diagnosis not present

## 2018-06-10 DIAGNOSIS — M5134 Other intervertebral disc degeneration, thoracic region: Secondary | ICD-10-CM | POA: Diagnosis not present

## 2018-06-10 DIAGNOSIS — M9903 Segmental and somatic dysfunction of lumbar region: Secondary | ICD-10-CM | POA: Diagnosis not present

## 2018-06-23 DIAGNOSIS — M5136 Other intervertebral disc degeneration, lumbar region: Secondary | ICD-10-CM | POA: Diagnosis not present

## 2018-06-23 DIAGNOSIS — M5416 Radiculopathy, lumbar region: Secondary | ICD-10-CM | POA: Diagnosis not present

## 2018-07-08 DIAGNOSIS — M5134 Other intervertebral disc degeneration, thoracic region: Secondary | ICD-10-CM | POA: Diagnosis not present

## 2018-07-08 DIAGNOSIS — M9902 Segmental and somatic dysfunction of thoracic region: Secondary | ICD-10-CM | POA: Diagnosis not present

## 2018-07-08 DIAGNOSIS — M9903 Segmental and somatic dysfunction of lumbar region: Secondary | ICD-10-CM | POA: Diagnosis not present

## 2018-07-08 DIAGNOSIS — M5416 Radiculopathy, lumbar region: Secondary | ICD-10-CM | POA: Diagnosis not present

## 2018-07-21 DIAGNOSIS — M48062 Spinal stenosis, lumbar region with neurogenic claudication: Secondary | ICD-10-CM | POA: Diagnosis not present

## 2018-07-21 DIAGNOSIS — M5136 Other intervertebral disc degeneration, lumbar region: Secondary | ICD-10-CM | POA: Diagnosis not present

## 2018-07-21 DIAGNOSIS — M5416 Radiculopathy, lumbar region: Secondary | ICD-10-CM | POA: Diagnosis not present

## 2018-08-05 DIAGNOSIS — M9903 Segmental and somatic dysfunction of lumbar region: Secondary | ICD-10-CM | POA: Diagnosis not present

## 2018-08-05 DIAGNOSIS — M5134 Other intervertebral disc degeneration, thoracic region: Secondary | ICD-10-CM | POA: Diagnosis not present

## 2018-08-05 DIAGNOSIS — M9902 Segmental and somatic dysfunction of thoracic region: Secondary | ICD-10-CM | POA: Diagnosis not present

## 2018-08-05 DIAGNOSIS — M5416 Radiculopathy, lumbar region: Secondary | ICD-10-CM | POA: Diagnosis not present

## 2018-08-14 ENCOUNTER — Ambulatory Visit (INDEPENDENT_AMBULATORY_CARE_PROVIDER_SITE_OTHER): Payer: Medicare Other

## 2018-08-14 ENCOUNTER — Other Ambulatory Visit: Payer: Self-pay

## 2018-08-14 VITALS — BP 138/70 | HR 74 | Temp 97.7°F | Resp 16 | Ht 67.0 in | Wt 156.1 lb

## 2018-08-14 DIAGNOSIS — Z0000 Encounter for general adult medical examination without abnormal findings: Secondary | ICD-10-CM

## 2018-08-14 NOTE — Progress Notes (Signed)
Subjective:   Deborah Baxter is a 85 y.o. female who presents for Medicare Annual (Subsequent) preventive examination.  Review of Systems:  No ROS.  Medicare Wellness Visit. Additional risk factors are reflected in the social history. Cardiac Risk Factors include: advanced age (>57men, >60 women);hypertension     Objective:     Vitals: BP 138/70 (BP Location: Right Arm, Patient Position: Sitting, Cuff Size: Normal)   Pulse 74   Temp 97.7 F (36.5 C) (Oral)   Resp 16   Ht 5\' 7"  (1.702 m)   Wt 156 lb 1.9 oz (70.8 kg)   LMP 06/26/1985   SpO2 95%   BMI 24.45 kg/m   Body mass index is 24.45 kg/m.  Advanced Directives 08/14/2018 08/12/2017 08/02/2016 08/02/2015 03/08/2015  Does Patient Have a Medical Advance Directive? Yes Yes Yes Yes No;Yes  Type of Advance Directive Healthcare Power of [place];Living will [place]  Does patient want to make changes to medical advance directive? No - Patient declined No - Patient declined - - No - Patient declined  Copy of [place] in Chart? Yes - validated most recent copy scanned in chart (See row information) No - copy requested No - copy requested No - copy requested No - copy requested    Tobacco Social History   Tobacco Use  Smoking Status Former Smoker  Smokeless Tobacco Never Used     Counseling given: Not Answered   Clinical Intake:  Pre-visit preparation completed: Yes           How often do you need to have someone help you when you read instructions, pamphlets, or other written materials from your doctor or pharmacy?: 1 - Never  Interpreter Needed?: No     Past Medical History:  Diagnosis Date  . Hx of degenerative disc disease   . Hypercholesterolemia   . Neuromuscular disorder ([place])    pinched nerve  . Osteoarthritis   . Seasonal allergies   . Spinal stenosis    chronic back  and leg pain   Past Surgical History:  Procedure Laterality Date  . APPENDECTOMY    . bladder tack surgery  2000  . BTL  1976  . CARPAL TUNNEL RELEASE    . CATARACT EXTRACTION W/PHACO Left 12/07/2015   Procedure: CATARACT EXTRACTION PHACO AND INTRAOCULAR LENS PLACEMENT (IOC);  Surgeon: Eulogio Bear, MD;  Location: [facility] ORS;  Service: Ophthalmology;  Laterality: Left;  [place] 1.23 AP% 23.2 CDE 19.28 Fluid Pack Lot # [id number]  . EYE SURGERY  03/19/15  . GANGLION CYST EXCISION     right hand  . GAS/FLUID EXCHANGE Left 03/08/2015   Procedure: GAS/FLUID EXCHANGE;  Surgeon: Milus Height, MD;  Location: [facility] ORS;  Service: Ophthalmology;  Laterality: Left;  . TONSILLECTOMY    . TUBAL LIGATION    . VITRECTOMY 25 GAUGE WITH SCLERAL BUCKLE Left 03/08/2015   Procedure: VITRECTOMY 25 GAUGE, membrane peel, 26 %  SF6 gas exchange;  Surgeon: Milus Height, MD;  Location: [facility] ORS;  Service: Ophthalmology;  Laterality: Left;  casette lot # [id number]   Family History  Problem Relation Age of Onset  . Heart disease Father   . Leukemia Father   . Diabetes Mother   . Heart disease Mother        myocardial infarction  . Breast cancer Sister        in her 62's  .  Cancer Sister        lung cancer  . Breast cancer Maternal Aunt   . Thyroid cancer Sister   . Colon cancer Neg Hx    Social History   Socioeconomic History  . Marital status: Widowed    Spouse name: Not on file  . Number of children: 5  . Years of education: Not on file  . Highest education level: Not on file  Occupational History  . Not on file  Social Needs  . Financial resource strain: Not hard at all  . Food insecurity:    Worry: Never true    Inability: Never true  . Transportation needs:    Medical: No    Non-medical: No  Tobacco Use  . Smoking status: Former Research scientist (life sciences)  . Smokeless tobacco: Never Used  Substance and Sexual Activity  . Alcohol use: Yes    Alcohol/week: 0.0 standard drinks    Comment: occassional   . Drug use: No  . Sexual activity: Never  Lifestyle  . Physical activity:    Days per week: 0 days    Minutes per session: Not on file  . Stress: Not at all  Relationships  . Social connections:    Talks on phone: Not on file    Gets together: Not on file    Attends religious service: Not on file    Active member of club or organization: Not on file    Attends meetings of clubs or organizations: Not on file    Relationship status: Not on file  Other Topics Concern  . Not on file  Social History Narrative  . Not on file    Outpatient Encounter Medications as of 08/14/2018  Medication Sig  . acetaminophen (TYLENOL) 650 MG CR tablet Take 650 mg by mouth every 8 (eight) hours as needed for pain. Reported on 08/02/2015  . Calcium Carbonate-Vitamin D (CALCIUM 500 + D PO) Take 1 tablet by mouth 2 (two) times daily.   Marland Kitchen ibuprofen (ADVIL,MOTRIN) 200 MG tablet Take 400 mg by mouth every 6 (six) hours as needed for mild pain. Reported on 08/02/2015  . Multiple Vitamin (MULTIVITAMIN) tablet Take 1 tablet by mouth daily.  Marland Kitchen OVER THE COUNTER MEDICATION Place 1 drop into both eyes every 8 (eight) hours as needed (dry eyes.). Lubricant eye drops  . Polyethylene Glycol 3350 (MIRALAX PO) Take 17 g by mouth every 3 (three) days. Every 3 days   No facility-administered encounter medications on file as of 08/14/2018.     Activities of Daily Living In your present state of health, do you have any difficulty performing the following activities: 08/14/2018  Hearing? N  Vision? N  Difficulty concentrating or making decisions? N  Walking or climbing stairs? N  Dressing or bathing? N  Doing errands, shopping? N  Preparing Food and eating ? N  Using the Toilet? N  In the past six months, have you accidently leaked urine? Y  Comment Managed with daily pad  Do you have problems with loss of bowel control? N  Managing your Medications? N  Managing your Finances? N  Housekeeping or managing your  Housekeeping? N  Some recent data might be hidden    Patient Care Team: Einar Pheasant, MD as PCP - General (Internal Medicine)    Assessment:   This is a routine wellness examination for Deborah Baxter.  Health Screenings  Mammogram -04/09/18 Colonoscopy -08/31/12 Bone Density -04/06/10 Glaucoma -none Hearing  -demonstrates normal hearing during conversation Dental- every 6 months  Vision-every 12 months Cholesterol -03/03/18  Social  Alcohol intake -no Smoking history- none Smokers in home?none Illicit drug use?none Exercise -walking Diet -regular Sexually Active -never  Safety  Patient feels safe at home.  Patient does have smoke detectors at home  Patient does wear sunscreen or protective clothing when in direct sunlight  Patient does wear seat belt when driving or riding with others.   Activities of Daily Living Patient can do their own household chores. Denies needing assistance with: driving, feeding themselves, getting from bed to chair, getting to the toilet, bathing/showering, dressing, managing money, climbing flight of stairs, or preparing meals.   Depression Screen Patient denies losing interest in daily life, feeling hopeless, or crying easily over simple problems.   Fall Screen Patient denies being afraid of falling or falling in the last year.   Memory Screen Patient denies problems with memory, misplacing items, and is able to balance checkbook/bank accounts.  Patient is alert, normal appearance, oriented to person/place/and time. Correctly identified the president of the [place], recall of 2/3 objects, and performing simple calculations.  Patient displays appropriate judgement and can read correct time from watch face.   Immunizations The following Immunizations are up to date: Influenza, and pneumonia. Shingles and tetanus discussed.   Other Providers Patient Care Team: Einar Pheasant, MD as PCP - General (Internal Medicine)  Exercise Activities and Dietary  recommendations Current Exercise Habits: Home exercise routine, Type of exercise: walking, Time (Minutes): 20, Frequency (Times/Week): 3, Weekly Exercise (Minutes/Week): 60, Intensity: Mild  Goals      Patient Stated   . Maintain Healthy Lifestyle (pt-stated)     Stay hydrated Stay active Healthy diet       Fall Risk Fall Risk  08/14/2018 08/12/2017 08/02/2016 02/14/2016 08/02/2015  Falls in the past year? 0 No No No No   Depression Screen PHQ 2/9 Scores 08/14/2018 08/12/2017 08/02/2016 02/14/2016  PHQ - 2 Score 0 0 0 0     Cognitive Function MMSE - Mini Mental State Exam 08/12/2017 08/02/2016 08/02/2015  Orientation to time 5 5 5   Orientation to Place 5 5 5   Registration 3 3 3   Attention/ Calculation 5 5 5   Recall 3 3 3   Language- name 2 objects 2 2 2   Language- repeat 1 1 1   Language- follow 3 step command 3 3 3   Language- read & follow direction 1 1 1   Write a sentence 1 1 1   Copy design 1 1 1   Total score 30 30 30      6CIT Screen 08/14/2018  What Year? 0 points  What month? 0 points  What time? 0 points  Count back from 20 0 points  Months in reverse 0 points  Repeat phrase 0 points  Total Score 0    Immunization History  Administered Date(s) Administered  . Influenza Split 02/15/2014  . Influenza, High Dose Seasonal PF 03/05/2018  . Influenza-Unspecified 03/04/2012, 03/24/2013, 03/08/2014, 02/20/2015, 03/05/2016, 03/11/2017  . Pneumococcal Conjugate-13 07/08/2013  . Pneumococcal Polysaccharide-23 07/12/2014   Screening Tests Health Maintenance  Topic Date Due  . TETANUS/TDAP  01/22/1953  . INFLUENZA VACCINE  01/01/2018  . MAMMOGRAM  04/10/2019  . DEXA SCAN  Completed  . PNA vac Low Risk Adult  Completed      Plan:    End of life planning; Advance aging; Advanced directives discussed. Copy of current HCPOA/Living Will requested.    I have personally reviewed and noted the following in the patient's chart:   . Medical and social  history . Use of alcohol,  tobacco or illicit drugs  . Current medications and supplements . Functional ability and status . Nutritional status . Physical activity . Advanced directives . List of other physicians . Hospitalizations, surgeries, and ER visits in previous 12 months . Vitals . Screenings to include cognitive, depression, and falls . Referrals and appointments  In addition, I have reviewed and discussed with patient certain preventive protocols, quality metrics, and best practice recommendations. A written personalized care plan for preventive services as well as general preventive health recommendations were provided to patient.     Varney Biles, LPN  3/84/5364   Reviewed above information.  Agree with assessment and plan.    Dr Nicki Reaper

## 2018-08-14 NOTE — Patient Instructions (Addendum)
  Ms. Pol , Thank you for taking time to come for your Medicare Wellness Visit. I appreciate your ongoing commitment to your health goals. Please review the following plan we discussed and let me know if I can assist you in the future.   These are the goals we discussed: Goals      Patient Stated   . Maintain Healthy Lifestyle (pt-stated)     Stay hydrated Stay active Healthy diet       This is a list of the screening recommended for you and due dates:  Health Maintenance  Topic Date Due  . Tetanus Vaccine  01/22/1953  . Flu Shot  01/01/2018  . Mammogram  04/10/2019  . DEXA scan (bone density measurement)  Completed  . Pneumonia vaccines  Completed

## 2018-08-18 DIAGNOSIS — M5136 Other intervertebral disc degeneration, lumbar region: Secondary | ICD-10-CM | POA: Diagnosis not present

## 2018-08-18 DIAGNOSIS — M5416 Radiculopathy, lumbar region: Secondary | ICD-10-CM | POA: Diagnosis not present

## 2018-08-18 DIAGNOSIS — M48062 Spinal stenosis, lumbar region with neurogenic claudication: Secondary | ICD-10-CM | POA: Diagnosis not present

## 2018-08-21 DIAGNOSIS — M5136 Other intervertebral disc degeneration, lumbar region: Secondary | ICD-10-CM | POA: Diagnosis not present

## 2018-08-21 DIAGNOSIS — M5416 Radiculopathy, lumbar region: Secondary | ICD-10-CM | POA: Diagnosis not present

## 2018-08-31 ENCOUNTER — Other Ambulatory Visit (INDEPENDENT_AMBULATORY_CARE_PROVIDER_SITE_OTHER): Payer: Medicare Other

## 2018-08-31 ENCOUNTER — Other Ambulatory Visit: Payer: Self-pay

## 2018-08-31 DIAGNOSIS — E7800 Pure hypercholesterolemia, unspecified: Secondary | ICD-10-CM | POA: Diagnosis not present

## 2018-08-31 LAB — CBC WITH DIFFERENTIAL/PLATELET
Basophils Absolute: 0.1 10*3/uL (ref 0.0–0.1)
Basophils Relative: 1 % (ref 0.0–3.0)
EOS PCT: 2.1 % (ref 0.0–5.0)
Eosinophils Absolute: 0.1 10*3/uL (ref 0.0–0.7)
HEMATOCRIT: 41.5 % (ref 36.0–46.0)
Hemoglobin: 13.9 g/dL (ref 12.0–15.0)
LYMPHS ABS: 2.1 10*3/uL (ref 0.7–4.0)
LYMPHS PCT: 36.4 % (ref 12.0–46.0)
MCHC: 33.4 g/dL (ref 30.0–36.0)
MCV: 95.6 fl (ref 78.0–100.0)
MONOS PCT: 8.3 % (ref 3.0–12.0)
Monocytes Absolute: 0.5 10*3/uL (ref 0.1–1.0)
NEUTROS ABS: 2.9 10*3/uL (ref 1.4–7.7)
NEUTROS PCT: 52.2 % (ref 43.0–77.0)
PLATELETS: 202 10*3/uL (ref 150.0–400.0)
RBC: 4.35 Mil/uL (ref 3.87–5.11)
RDW: 13.2 % (ref 11.5–15.5)
WBC: 5.6 10*3/uL (ref 4.0–10.5)

## 2018-08-31 LAB — COMPREHENSIVE METABOLIC PANEL
ALT: 25 U/L (ref 0–35)
AST: 20 U/L (ref 0–37)
Albumin: 4 g/dL (ref 3.5–5.2)
Alkaline Phosphatase: 47 U/L (ref 39–117)
BUN: 16 mg/dL (ref 6–23)
CALCIUM: 9.5 mg/dL (ref 8.4–10.5)
CO2: 31 meq/L (ref 19–32)
Chloride: 107 mEq/L (ref 96–112)
Creatinine, Ser: 0.67 mg/dL (ref 0.40–1.20)
GFR: 84.14 mL/min (ref >60.00)
GLUCOSE: 94 mg/dL (ref 70–99)
Potassium: 5 mEq/L (ref 3.5–5.1)
Sodium: 142 mEq/L (ref 135–145)
Total Bilirubin: 0.6 mg/dL (ref 0.2–1.2)
Total Protein: 6.3 g/dL (ref 6.0–8.3)

## 2018-08-31 LAB — LIPID PANEL
CHOL/HDL RATIO: 3
Cholesterol: 237 mg/dL — ABNORMAL HIGH (ref 0–200)
HDL: 72.5 mg/dL (ref >39.00)
LDL Cholesterol: 138 mg/dL — ABNORMAL HIGH (ref 0–99)
NONHDL: 164.22
Triglycerides: 129 mg/dL (ref 0.0–149.0)
VLDL: 25.8 mg/dL (ref 0.0–40.0)

## 2018-08-31 LAB — TSH: TSH: 2.09 u[IU]/mL (ref 0.35–4.50)

## 2018-09-02 DIAGNOSIS — M9902 Segmental and somatic dysfunction of thoracic region: Secondary | ICD-10-CM | POA: Diagnosis not present

## 2018-09-02 DIAGNOSIS — M5416 Radiculopathy, lumbar region: Secondary | ICD-10-CM | POA: Diagnosis not present

## 2018-09-02 DIAGNOSIS — M5134 Other intervertebral disc degeneration, thoracic region: Secondary | ICD-10-CM | POA: Diagnosis not present

## 2018-09-02 DIAGNOSIS — M9903 Segmental and somatic dysfunction of lumbar region: Secondary | ICD-10-CM | POA: Diagnosis not present

## 2018-09-04 ENCOUNTER — Encounter: Payer: Self-pay | Admitting: Internal Medicine

## 2018-09-04 ENCOUNTER — Ambulatory Visit (INDEPENDENT_AMBULATORY_CARE_PROVIDER_SITE_OTHER): Payer: Medicare Other | Admitting: Internal Medicine

## 2018-09-04 DIAGNOSIS — E7800 Pure hypercholesterolemia, unspecified: Secondary | ICD-10-CM

## 2018-09-04 DIAGNOSIS — M4807 Spinal stenosis, lumbosacral region: Secondary | ICD-10-CM

## 2018-09-06 ENCOUNTER — Encounter: Payer: Self-pay | Admitting: Internal Medicine

## 2018-09-06 NOTE — Progress Notes (Addendum)
Patient ID: Deborah Baxter, female   DOB: [birth year], 85 y.o.   MRN: [id number] Telephone Visit:  Note  I connected with Deborah Baxter on 09/04/18 at 10:30 AM EDT by telephone.   Verified that I am speaking with the correct person using two identifiers. Location patient: home Location provider:work Persons participating in the visit: patient, provider  I discussed the limitations of evaluation and management by telephone. This visit type was conducted due to national recommendations for restrictions regarding the COVID-19 pandemic.  This format is felt to be most appropriate for this patient at this time.   The patient expressed understanding and agreed to proceed.   HPI: This was a scheduled follow up.  She reports she is doing well.  Feels good.  No cough or congestion.  No sob.  Staying in.  Trying to stay active.  No chest pain.  No acid reflux.  No abdominal pain.  Bowels moving.  Saw Dr Sharlet Salina.  Had her third injection - 08/08/18 - back.  Has helped.  Overall she feels she is doing well.  Discussed her labs.  Cholesterol increased.  She continues to decline cholesterol medication.  She has been reading and playing games to exercise her mind.     ROS: See pertinent positives and negatives per HPI.  Past Medical History:  Diagnosis Date  . Hx of degenerative disc disease   . Hypercholesterolemia   . Neuromuscular disorder ([place])    pinched nerve  . Osteoarthritis   . Seasonal allergies   . Spinal stenosis    chronic back and leg pain    Past Surgical History:  Procedure Laterality Date  . APPENDECTOMY    . bladder tack surgery  2000  . BTL  1976  . CARPAL TUNNEL RELEASE    . CATARACT EXTRACTION W/PHACO Left 12/07/2015   Procedure: CATARACT EXTRACTION PHACO AND INTRAOCULAR LENS PLACEMENT (IOC);  Surgeon: Eulogio Bear, MD;  Location: [facility] ORS;  Service: Ophthalmology;  Laterality: Left;  [place] 1.23 AP% 23.2 CDE 19.28 Fluid Pack Lot # [id number]  . EYE SURGERY  03/19/15  . GANGLION CYST  EXCISION     right hand  . GAS/FLUID EXCHANGE Left 03/08/2015   Procedure: GAS/FLUID EXCHANGE;  Surgeon: Milus Height, MD;  Location: [facility] OR[facility];  Service: Ophthalmology;  Laterality: Left;  . TONSILLECTOMY    . TUBAL LIGATION    . VITRECTOMY 25 GAUGE WITH SCLERAL BUCKLE Left 03/08/2015   Procedure: VITRECTOMY 25 GAUGE, membrane peel, 26 %  SF6 gas exchange;  Surgeon: Milus Height, MD;  Location: [facility] OR[facility];  Service: Ophthalmology;  Laterality: Left;  casette lot # [id number]    Family History  Problem Relation Age of Onset  . Heart disease Father   . Leukemia Father   . Diabetes Mother   . Heart disease Mother        myocardial infarction  . Breast cancer Sister        in her 61's  . Cancer Sister        lung cancer  . Breast cancer Maternal Aunt   . Thyroid cancer Sister   . Colon cancer Neg Hx     SOCIAL HX: reviewed.    Current Outpatient Medications:  .  acetaminophen (TYLENOL) 650 MG CR tablet, Take 650 mg by mouth every 8 (eight) hours as needed for pain. Reported on 08/02/2015, Disp: , Rfl:  .  Calcium Carbonate-Vitamin D (CALCIUM 500 + D PO), Take 1 tablet by mouth 2 (two) times  daily. , Disp: , Rfl:  .  ibuprofen (ADVIL,MOTRIN) 200 MG tablet, Take 400 mg by mouth every 6 (six) hours as needed for mild pain. Reported on 08/02/2015, Disp: , Rfl:  .  Multiple Vitamin (MULTIVITAMIN) tablet, Take 1 tablet by mouth daily., Disp: , Rfl:  .  OVER THE COUNTER MEDICATION, Place 1 drop into both eyes every 8 (eight) hours as needed (dry eyes.). Lubricant eye drops, Disp: , Rfl:  .  Polyethylene Glycol 3350 (MIRALAX PO), Take 17 g by mouth every 3 (three) days. Every 3 days, Disp: , Rfl:   EXAM:  GENERAL: answering questions appropriately.  Sounds to be in no acute distress.     ASSESSMENT AND PLAN:  Discussed the following assessment and plan:  Hypercholesterolemia  Spinal stenosis of lumbosacral region     I discussed the assessment and treatment plan with the  patient. The patient was provided an opportunity to ask questions and all were answered. The patient agreed with the plan and demonstrated an understanding of the instructions.   The patient was advised to call back or seek an in-person evaluation if the symptoms worsen or if the condition fails to improve as anticipated.  I provided 15 minutes of non-face-to-face time during this encounter.   Einar Pheasant, MD

## 2018-09-06 NOTE — Assessment & Plan Note (Signed)
Discussed calculated cholesterol risk.  Declines cholesterol medication.  Low cholesterol diet and exercise.  Follow lipid panel.

## 2018-09-06 NOTE — Assessment & Plan Note (Signed)
Has known spinal stenosis.  Has seen Dr Sharlet Salina.  S/p injections.  Doing better.  Pain better.  Follow.

## 2018-09-30 DIAGNOSIS — M9903 Segmental and somatic dysfunction of lumbar region: Secondary | ICD-10-CM | POA: Diagnosis not present

## 2018-09-30 DIAGNOSIS — M9902 Segmental and somatic dysfunction of thoracic region: Secondary | ICD-10-CM | POA: Diagnosis not present

## 2018-09-30 DIAGNOSIS — M5134 Other intervertebral disc degeneration, thoracic region: Secondary | ICD-10-CM | POA: Diagnosis not present

## 2018-09-30 DIAGNOSIS — M5416 Radiculopathy, lumbar region: Secondary | ICD-10-CM | POA: Diagnosis not present

## 2018-10-28 DIAGNOSIS — M5416 Radiculopathy, lumbar region: Secondary | ICD-10-CM | POA: Diagnosis not present

## 2018-10-28 DIAGNOSIS — M9902 Segmental and somatic dysfunction of thoracic region: Secondary | ICD-10-CM | POA: Diagnosis not present

## 2018-10-28 DIAGNOSIS — M5134 Other intervertebral disc degeneration, thoracic region: Secondary | ICD-10-CM | POA: Diagnosis not present

## 2018-10-28 DIAGNOSIS — M9903 Segmental and somatic dysfunction of lumbar region: Secondary | ICD-10-CM | POA: Diagnosis not present

## 2018-11-11 DIAGNOSIS — M9902 Segmental and somatic dysfunction of thoracic region: Secondary | ICD-10-CM | POA: Diagnosis not present

## 2018-11-11 DIAGNOSIS — M9903 Segmental and somatic dysfunction of lumbar region: Secondary | ICD-10-CM | POA: Diagnosis not present

## 2018-11-11 DIAGNOSIS — M5416 Radiculopathy, lumbar region: Secondary | ICD-10-CM | POA: Diagnosis not present

## 2018-11-11 DIAGNOSIS — M5134 Other intervertebral disc degeneration, thoracic region: Secondary | ICD-10-CM | POA: Diagnosis not present

## 2018-11-25 DIAGNOSIS — M9902 Segmental and somatic dysfunction of thoracic region: Secondary | ICD-10-CM | POA: Diagnosis not present

## 2018-11-25 DIAGNOSIS — M9903 Segmental and somatic dysfunction of lumbar region: Secondary | ICD-10-CM | POA: Diagnosis not present

## 2018-11-25 DIAGNOSIS — M5416 Radiculopathy, lumbar region: Secondary | ICD-10-CM | POA: Diagnosis not present

## 2018-11-25 DIAGNOSIS — M5134 Other intervertebral disc degeneration, thoracic region: Secondary | ICD-10-CM | POA: Diagnosis not present

## 2018-12-14 DIAGNOSIS — M9902 Segmental and somatic dysfunction of thoracic region: Secondary | ICD-10-CM | POA: Diagnosis not present

## 2018-12-14 DIAGNOSIS — M9903 Segmental and somatic dysfunction of lumbar region: Secondary | ICD-10-CM | POA: Diagnosis not present

## 2018-12-14 DIAGNOSIS — M5134 Other intervertebral disc degeneration, thoracic region: Secondary | ICD-10-CM | POA: Diagnosis not present

## 2018-12-14 DIAGNOSIS — M5416 Radiculopathy, lumbar region: Secondary | ICD-10-CM | POA: Diagnosis not present

## 2018-12-28 DIAGNOSIS — M5134 Other intervertebral disc degeneration, thoracic region: Secondary | ICD-10-CM | POA: Diagnosis not present

## 2018-12-28 DIAGNOSIS — M9902 Segmental and somatic dysfunction of thoracic region: Secondary | ICD-10-CM | POA: Diagnosis not present

## 2018-12-28 DIAGNOSIS — M5416 Radiculopathy, lumbar region: Secondary | ICD-10-CM | POA: Diagnosis not present

## 2018-12-28 DIAGNOSIS — M9903 Segmental and somatic dysfunction of lumbar region: Secondary | ICD-10-CM | POA: Diagnosis not present

## 2019-01-11 DIAGNOSIS — M5134 Other intervertebral disc degeneration, thoracic region: Secondary | ICD-10-CM | POA: Diagnosis not present

## 2019-01-11 DIAGNOSIS — M5416 Radiculopathy, lumbar region: Secondary | ICD-10-CM | POA: Diagnosis not present

## 2019-01-11 DIAGNOSIS — M9902 Segmental and somatic dysfunction of thoracic region: Secondary | ICD-10-CM | POA: Diagnosis not present

## 2019-01-11 DIAGNOSIS — M9903 Segmental and somatic dysfunction of lumbar region: Secondary | ICD-10-CM | POA: Diagnosis not present

## 2019-01-25 DIAGNOSIS — M5134 Other intervertebral disc degeneration, thoracic region: Secondary | ICD-10-CM | POA: Diagnosis not present

## 2019-01-25 DIAGNOSIS — M9903 Segmental and somatic dysfunction of lumbar region: Secondary | ICD-10-CM | POA: Diagnosis not present

## 2019-01-25 DIAGNOSIS — M9902 Segmental and somatic dysfunction of thoracic region: Secondary | ICD-10-CM | POA: Diagnosis not present

## 2019-01-25 DIAGNOSIS — M5416 Radiculopathy, lumbar region: Secondary | ICD-10-CM | POA: Diagnosis not present

## 2019-02-09 DIAGNOSIS — M5134 Other intervertebral disc degeneration, thoracic region: Secondary | ICD-10-CM | POA: Diagnosis not present

## 2019-02-09 DIAGNOSIS — M9902 Segmental and somatic dysfunction of thoracic region: Secondary | ICD-10-CM | POA: Diagnosis not present

## 2019-02-09 DIAGNOSIS — M5416 Radiculopathy, lumbar region: Secondary | ICD-10-CM | POA: Diagnosis not present

## 2019-02-09 DIAGNOSIS — M9903 Segmental and somatic dysfunction of lumbar region: Secondary | ICD-10-CM | POA: Diagnosis not present

## 2019-02-12 ENCOUNTER — Telehealth: Payer: Self-pay | Admitting: *Deleted

## 2019-02-12 DIAGNOSIS — E7800 Pure hypercholesterolemia, unspecified: Secondary | ICD-10-CM

## 2019-02-12 NOTE — Telephone Encounter (Signed)
Please place future orders for lab appt.  

## 2019-02-14 NOTE — Telephone Encounter (Signed)
Order placed for f/u labs.  

## 2019-02-15 ENCOUNTER — Other Ambulatory Visit: Payer: PRIVATE HEALTH INSURANCE

## 2019-02-18 ENCOUNTER — Other Ambulatory Visit: Payer: Self-pay

## 2019-02-18 ENCOUNTER — Encounter: Payer: Self-pay | Admitting: Internal Medicine

## 2019-02-18 ENCOUNTER — Ambulatory Visit (INDEPENDENT_AMBULATORY_CARE_PROVIDER_SITE_OTHER): Payer: Medicare Other | Admitting: Internal Medicine

## 2019-02-18 VITALS — BP 130/78 | HR 67 | Temp 97.9°F | Resp 16 | Wt 158.0 lb

## 2019-02-18 DIAGNOSIS — Z1231 Encounter for screening mammogram for malignant neoplasm of breast: Secondary | ICD-10-CM | POA: Diagnosis not present

## 2019-02-18 DIAGNOSIS — Z0000 Encounter for general adult medical examination without abnormal findings: Secondary | ICD-10-CM | POA: Diagnosis not present

## 2019-02-18 DIAGNOSIS — Z1239 Encounter for other screening for malignant neoplasm of breast: Secondary | ICD-10-CM | POA: Diagnosis not present

## 2019-02-18 DIAGNOSIS — E7800 Pure hypercholesterolemia, unspecified: Secondary | ICD-10-CM

## 2019-02-18 DIAGNOSIS — M4807 Spinal stenosis, lumbosacral region: Secondary | ICD-10-CM

## 2019-02-18 LAB — COMPREHENSIVE METABOLIC PANEL
ALT: 22 U/L (ref 0–35)
AST: 22 U/L (ref 0–37)
Albumin: 4.1 g/dL (ref 3.5–5.2)
Alkaline Phosphatase: 52 U/L (ref 39–117)
BUN: 13 mg/dL (ref 6–23)
CO2: 29 mEq/L (ref 19–32)
Calcium: 9.7 mg/dL (ref 8.4–10.5)
Chloride: 102 mEq/L (ref 96–112)
Creatinine, Ser: 0.66 mg/dL (ref 0.40–1.20)
GFR: 85.51 mL/min (ref >60.00)
Glucose, Bld: 83 mg/dL (ref 70–99)
Potassium: 4.3 mEq/L (ref 3.5–5.1)
Sodium: 138 mEq/L (ref 135–145)
Total Bilirubin: 0.6 mg/dL (ref 0.2–1.2)
Total Protein: 6.9 g/dL (ref 6.0–8.3)

## 2019-02-18 LAB — LIPID PANEL
Cholesterol: 217 mg/dL — ABNORMAL HIGH (ref 0–200)
HDL: 73.9 mg/dL (ref >39.00)
LDL Cholesterol: 122 mg/dL — ABNORMAL HIGH (ref 0–99)
NonHDL: 143.42
Total CHOL/HDL Ratio: 3
Triglycerides: 108 mg/dL (ref 0.0–149.0)
VLDL: 21.6 mg/dL (ref 0.0–40.0)

## 2019-02-18 NOTE — Progress Notes (Signed)
Patient ID: Deborah Baxter, female   DOB: 1933-07-20, 85 y.o.   MRN: [id number]   Subjective:    Patient ID: Deborah Baxter, female    DOB: 25-Mar-1934, 85 y.o.   MRN: [id number]  HPI  Patient with past history of hypercholesterolemia and back pain.  She comes in today to follow up on these issues as well as for a complete physical exam.  Has had chronic back pain.  Saw Dr Sharlet Salina.  Received three injections - last 08/2018.  Does not feel needs f/u at this time.  No chest pain.  No sob.  No acid reflux.  No abdominal pain.  Bowels moving.  Overall she feels she is doing well.    Past Medical History:  Diagnosis Date  . Hx of degenerative disc disease   . Hypercholesterolemia   . Neuromuscular disorder ([place])    pinched nerve  . Osteoarthritis   . Seasonal allergies   . Spinal stenosis    chronic back and leg pain   Past Surgical History:  Procedure Laterality Date  . APPENDECTOMY    . bladder tack surgery  2000  . BTL  1976  . CARPAL TUNNEL RELEASE    . CATARACT EXTRACTION W/PHACO Left 12/07/2015   Procedure: CATARACT EXTRACTION PHACO AND INTRAOCULAR LENS PLACEMENT (IOC);  Surgeon: Eulogio Bear, MD;  Location: [facility] OR[facility];  Service: Ophthalmology;  Laterality: Left;  [place] 1.23 AP% 23.2 CDE 19.28 Fluid Pack Lot # [id number]  . EYE SURGERY  03/19/15  . GANGLION CYST EXCISION     right hand  . GAS/FLUID EXCHANGE Left 03/08/2015   Procedure: GAS/FLUID EXCHANGE;  Surgeon: Milus Height, MD;  Location: [facility];  Service: Ophthalmology;  Laterality: Left;  . TONSILLECTOMY    . TUBAL LIGATION    . VITRECTOMY 25 GAUGE WITH SCLERAL BUCKLE Left 03/08/2015   Procedure: VITRECTOMY 25 GAUGE, membrane peel, 26 %  SF6 gas exchange;  Surgeon: Milus Height, MD;  Location: [facility] OR[facility];  Service: Ophthalmology;  Laterality: Left;  casette lot # [id number]   Family History  Problem Relation Age of Onset  . Heart disease Father   . Leukemia Father   . Diabetes Mother   . Heart disease Mother    myocardial infarction  . Breast cancer Sister        in her 64's  . Cancer Sister        lung cancer  . Breast cancer Maternal Aunt   . Thyroid cancer Sister   . Colon cancer Neg Hx    Social History   Socioeconomic History  . Marital status: Widowed    Spouse name: Not on file  . Number of children: 5  . Years of education: Not on file  . Highest education level: Not on file  Occupational History  . Not on file  Social Needs  . Financial resource strain: Not hard at all  . Food insecurity    Worry: Never true    Inability: Never true  . Transportation needs    Medical: No    Non-medical: No  Tobacco Use  . Smoking status: Former Research scientist (life sciences)  . Smokeless tobacco: Never Used  Substance and Sexual Activity  . Alcohol use: Yes    Alcohol/week: 0.0 standard drinks    Comment: occassional  . Drug use: No  . Sexual activity: Never  Lifestyle  . Physical activity    Days per week: 0 days    Minutes per session: Not on file  .  Stress: Not at all  Relationships  . Social Herbalist on phone: Not on file    Gets together: Not on file    Attends religious service: Not on file    Active member of club or organization: Not on file    Attends meetings of clubs or organizations: Not on file    Relationship status: Not on file  Other Topics Concern  . Not on file  Social History Narrative  . Not on file    Outpatient Encounter Medications as of 02/18/2019  Medication Sig  . acetaminophen (TYLENOL) 650 MG CR tablet Take 650 mg by mouth every 8 (eight) hours as needed for pain. Reported on 08/02/2015  . Calcium Carbonate-Vitamin D (CALCIUM 500 + D PO) Take 1 tablet by mouth 2 (two) times daily.   Marland Kitchen ibuprofen (ADVIL,MOTRIN) 200 MG tablet Take 400 mg by mouth every 6 (six) hours as needed for mild pain. Reported on 08/02/2015  . Multiple Vitamin (MULTIVITAMIN) tablet Take 1 tablet by mouth daily.  Marland Kitchen OVER THE COUNTER MEDICATION Place 1 drop into both eyes every 8 (eight) hours  as needed (dry eyes.). Lubricant eye drops  . Polyethylene Glycol 3350 (MIRALAX PO) Take 17 g by mouth every 3 (three) days. Every 3 days   No facility-administered encounter medications on file as of 02/18/2019.     Review of Systems  Constitutional: Negative for appetite change and unexpected weight change.  HENT: Negative for congestion and sinus pressure.   Eyes: Negative for pain and visual disturbance.  Respiratory: Negative for cough, chest tightness and shortness of breath.   Cardiovascular: Negative for chest pain, palpitations and leg swelling.  Gastrointestinal: Negative for abdominal pain, diarrhea, nausea and vomiting.  Genitourinary: Negative for difficulty urinating and dysuria.  Musculoskeletal: Positive for back pain. Negative for joint swelling and myalgias.  Skin: Negative for color change and rash.  Neurological: Negative for dizziness, light-headedness and headaches.  Hematological: Negative for adenopathy. Does not bruise/bleed easily.  Psychiatric/Behavioral: Negative for agitation and dysphoric mood.       Objective:    Physical Exam Constitutional:      General: She is not in acute distress.    Appearance: Normal appearance. She is well-developed.  HENT:     Head: Normocephalic and atraumatic.     Right Ear: External ear normal.     Left Ear: External ear normal.  Eyes:     General: No scleral icterus.       Right eye: No discharge.        Left eye: No discharge.     Conjunctiva/sclera: Conjunctivae normal.  Neck:     Musculoskeletal: Neck supple. No muscular tenderness.     Thyroid: No thyromegaly.  Cardiovascular:     Rate and Rhythm: Normal rate and regular rhythm.  Pulmonary:     Effort: No tachypnea, accessory muscle usage or respiratory distress.     Breath sounds: Normal breath sounds. No decreased breath sounds or wheezing.  Chest:     Breasts:        Right: No inverted nipple, mass, nipple discharge or tenderness (no axillary  adenopathy).        Left: No inverted nipple, mass, nipple discharge or tenderness (no axilarry adenopathy).  Abdominal:     General: Bowel sounds are normal.     Palpations: Abdomen is soft.     Tenderness: There is no abdominal tenderness.  Musculoskeletal:        General: No  swelling or tenderness.  Lymphadenopathy:     Cervical: No cervical adenopathy.  Skin:    Findings: No erythema or rash.  Neurological:     Mental Status: She is alert and oriented to person, place, and time.  Psychiatric:        Mood and Affect: Mood normal.        Behavior: Behavior normal.     BP 130/78   Pulse 67   Temp 97.9 F (36.6 C)   Resp 16   Wt 158 lb (71.7 kg)   LMP 06/26/1985   SpO2 98%   BMI 24.75 kg/m  Wt Readings from Last 3 Encounters:  02/18/19 158 lb (71.7 kg)  08/14/18 156 lb 1.9 oz (70.8 kg)  03/05/18 152 lb (68.9 kg)     Lab Results  Component Value Date   WBC 5.6 08/31/2018   HGB 13.9 08/31/2018   HCT 41.5 08/31/2018   PLT 202.0 08/31/2018   GLUCOSE 83 02/18/2019   CHOL 217 (H) 02/18/2019   TRIG 108.0 02/18/2019   HDL 73.90 02/18/2019   LDLDIRECT 104.0 08/12/2017   LDLCALC 122 (H) 02/18/2019   ALT 22 02/18/2019   AST 22 02/18/2019   NA 138 02/18/2019   K 4.3 02/18/2019   CL 102 02/18/2019   CREATININE 0.66 02/18/2019   BUN 13 02/18/2019   CO2 29 02/18/2019   TSH 2.09 08/31/2018    Mm 3d Screen Breast Bilateral  Result Date: 04/09/2018 CLINICAL DATA:  Screening. EXAM: DIGITAL SCREENING BILATERAL MAMMOGRAM WITH TOMO AND CAD COMPARISON:  Previous exam(s). ACR Breast Density Category b: There are scattered areas of fibroglandular density. FINDINGS: There are no findings suspicious for malignancy. Images were processed with CAD. IMPRESSION: No mammographic evidence of malignancy. A result letter of this screening mammogram will be mailed directly to the patient. RECOMMENDATION: Screening mammogram in one year. (Code:SM-B-01Y) BI-RADS CATEGORY  1: Negative.  Electronically Signed   By: Everlean Alstrom M.D.   On: 04/09/2018 08:42       Assessment & Plan:   Problem List Items Addressed This Visit    Health care maintenance    Physical today 02/18/19.  Colonoscopy 08/31/12.  Mammogram 04/09/18 - Birads I.        Hypercholesterolemia    Discussed calculated cholesterol risk.  Declines cholesterol medication.  Low cholesterol diet and exercise.  Follow lipid panel.        Spinal stenosis    Has known spinal stenosis. Has seen Dr Sharlet Salina.  S/p injections.  Doing better.  Follow.         Other Visit Diagnoses    Encounter for screening mammogram for breast cancer    -  Primary   Relevant Orders   MM 3D SCREEN BREAST BILATERAL   Breast cancer screening           Einar Pheasant, MD

## 2019-02-21 NOTE — Assessment & Plan Note (Signed)
Has known spinal stenosis. Has seen Dr Sharlet Salina.  S/p injections.  Doing better.  Follow.

## 2019-02-21 NOTE — Assessment & Plan Note (Signed)
Physical today 02/18/19.  Colonoscopy 08/31/12.  Mammogram 04/09/18 - Birads I.

## 2019-02-21 NOTE — Assessment & Plan Note (Signed)
Discussed calculated cholesterol risk.  Declines cholesterol medication.  Low cholesterol diet and exercise.  Follow lipid panel.   

## 2019-02-23 ENCOUNTER — Telehealth: Payer: Self-pay | Admitting: Internal Medicine

## 2019-02-23 DIAGNOSIS — M9903 Segmental and somatic dysfunction of lumbar region: Secondary | ICD-10-CM | POA: Diagnosis not present

## 2019-02-23 DIAGNOSIS — M5134 Other intervertebral disc degeneration, thoracic region: Secondary | ICD-10-CM | POA: Diagnosis not present

## 2019-02-23 DIAGNOSIS — M9902 Segmental and somatic dysfunction of thoracic region: Secondary | ICD-10-CM | POA: Diagnosis not present

## 2019-02-23 DIAGNOSIS — M5416 Radiculopathy, lumbar region: Secondary | ICD-10-CM | POA: Diagnosis not present

## 2019-02-23 NOTE — Telephone Encounter (Signed)
Pt given lab results per notes of Dr Nicki Reaper on *02/19/2019. Pt verbalized understanding. Patient refuses to start Cholesterol medication.  She states she will continue with diet modification and exercise.

## 2019-03-16 DIAGNOSIS — M9902 Segmental and somatic dysfunction of thoracic region: Secondary | ICD-10-CM | POA: Diagnosis not present

## 2019-03-16 DIAGNOSIS — Z23 Encounter for immunization: Secondary | ICD-10-CM | POA: Diagnosis not present

## 2019-03-16 DIAGNOSIS — M9903 Segmental and somatic dysfunction of lumbar region: Secondary | ICD-10-CM | POA: Diagnosis not present

## 2019-03-16 DIAGNOSIS — M5134 Other intervertebral disc degeneration, thoracic region: Secondary | ICD-10-CM | POA: Diagnosis not present

## 2019-03-16 DIAGNOSIS — M5416 Radiculopathy, lumbar region: Secondary | ICD-10-CM | POA: Diagnosis not present

## 2019-04-12 ENCOUNTER — Ambulatory Visit
Admission: RE | Admit: 2019-04-12 | Discharge: 2019-04-12 | Disposition: A | Discharge status: Home / Self Care | Payer: Medicare Other | Source: Ambulatory Visit | Attending: Internal Medicine | Admitting: Internal Medicine

## 2019-04-12 DIAGNOSIS — Z1231 Encounter for screening mammogram for malignant neoplasm of breast: Secondary | ICD-10-CM | POA: Diagnosis not present

## 2019-04-13 DIAGNOSIS — M5134 Other intervertebral disc degeneration, thoracic region: Secondary | ICD-10-CM | POA: Diagnosis not present

## 2019-04-13 DIAGNOSIS — M5416 Radiculopathy, lumbar region: Secondary | ICD-10-CM | POA: Diagnosis not present

## 2019-04-13 DIAGNOSIS — M9903 Segmental and somatic dysfunction of lumbar region: Secondary | ICD-10-CM | POA: Diagnosis not present

## 2019-04-13 DIAGNOSIS — M9902 Segmental and somatic dysfunction of thoracic region: Secondary | ICD-10-CM | POA: Diagnosis not present

## 2019-05-11 DIAGNOSIS — M9904 Segmental and somatic dysfunction of sacral region: Secondary | ICD-10-CM | POA: Diagnosis not present

## 2019-05-11 DIAGNOSIS — M62830 Muscle spasm of back: Secondary | ICD-10-CM | POA: Diagnosis not present

## 2019-05-11 DIAGNOSIS — M5416 Radiculopathy, lumbar region: Secondary | ICD-10-CM | POA: Diagnosis not present

## 2019-05-11 DIAGNOSIS — M9903 Segmental and somatic dysfunction of lumbar region: Secondary | ICD-10-CM | POA: Diagnosis not present

## 2019-06-08 DIAGNOSIS — M9904 Segmental and somatic dysfunction of sacral region: Secondary | ICD-10-CM | POA: Diagnosis not present

## 2019-06-08 DIAGNOSIS — M5416 Radiculopathy, lumbar region: Secondary | ICD-10-CM | POA: Diagnosis not present

## 2019-06-08 DIAGNOSIS — M62830 Muscle spasm of back: Secondary | ICD-10-CM | POA: Diagnosis not present

## 2019-06-08 DIAGNOSIS — M9903 Segmental and somatic dysfunction of lumbar region: Secondary | ICD-10-CM | POA: Diagnosis not present

## 2019-06-25 ENCOUNTER — Telehealth: Payer: Self-pay | Admitting: Internal Medicine

## 2019-06-25 DIAGNOSIS — Z23 Encounter for immunization: Secondary | ICD-10-CM | POA: Diagnosis not present

## 2019-06-25 NOTE — Telephone Encounter (Signed)
FYI: Pt had her covid vaccine today.

## 2019-07-06 DIAGNOSIS — M9903 Segmental and somatic dysfunction of lumbar region: Secondary | ICD-10-CM | POA: Diagnosis not present

## 2019-07-06 DIAGNOSIS — M9904 Segmental and somatic dysfunction of sacral region: Secondary | ICD-10-CM | POA: Diagnosis not present

## 2019-07-06 DIAGNOSIS — M5416 Radiculopathy, lumbar region: Secondary | ICD-10-CM | POA: Diagnosis not present

## 2019-07-06 DIAGNOSIS — M62830 Muscle spasm of back: Secondary | ICD-10-CM | POA: Diagnosis not present

## 2019-08-03 DIAGNOSIS — M9903 Segmental and somatic dysfunction of lumbar region: Secondary | ICD-10-CM | POA: Diagnosis not present

## 2019-08-03 DIAGNOSIS — M5416 Radiculopathy, lumbar region: Secondary | ICD-10-CM | POA: Diagnosis not present

## 2019-08-03 DIAGNOSIS — M9904 Segmental and somatic dysfunction of sacral region: Secondary | ICD-10-CM | POA: Diagnosis not present

## 2019-08-03 DIAGNOSIS — M62830 Muscle spasm of back: Secondary | ICD-10-CM | POA: Diagnosis not present

## 2019-08-17 ENCOUNTER — Ambulatory Visit: Payer: PRIVATE HEALTH INSURANCE | Admitting: Internal Medicine

## 2019-08-17 ENCOUNTER — Ambulatory Visit: Payer: PRIVATE HEALTH INSURANCE

## 2019-08-17 ENCOUNTER — Other Ambulatory Visit: Payer: Self-pay

## 2019-08-17 ENCOUNTER — Ambulatory Visit (INDEPENDENT_AMBULATORY_CARE_PROVIDER_SITE_OTHER): Payer: Medicare Other

## 2019-08-17 ENCOUNTER — Encounter (INDEPENDENT_AMBULATORY_CARE_PROVIDER_SITE_OTHER): Payer: Self-pay

## 2019-08-17 VITALS — Ht 67.0 in | Wt 158.0 lb

## 2019-08-17 DIAGNOSIS — Z0000 Encounter for general adult medical examination without abnormal findings: Secondary | ICD-10-CM | POA: Diagnosis not present

## 2019-08-17 NOTE — Patient Instructions (Addendum)
  Deborah Baxter , Thank you for taking time to come for your Medicare Wellness Visit. I appreciate your ongoing commitment to your health goals. Please review the following plan we discussed and let me know if I can assist you in the future.   These are the goals we discussed: Goals      Patient Stated   . Maintain Healthy Lifestyle (pt-stated)     Stay hydrated Stay active Healthy diet       This is a list of the screening recommended for you and due dates:  Health Maintenance  Topic Date Due  . Tetanus Vaccine  Never done  . Mammogram  04/11/2020  . Flu Shot  Completed  . DEXA scan (bone density measurement)  Completed  . Pneumonia vaccines  Completed

## 2019-08-17 NOTE — Progress Notes (Addendum)
Subjective:   Deborah Baxter is a 86 y.o. female who presents for Medicare Annual (Subsequent) preventive examination.  Review of Systems:  No ROS.  Medicare Wellness Virtual Visit.  Visual/audio telehealth visit, UTA vital signs.   Ht/Wt provided.  See social history for additional risk factors.   Cardiac Risk Factors include: advanced age (>40men, >22 women)     Objective:     Vitals: Ht 5\' 7"  (1.702 m)   Wt 158 lb (71.7 kg)   LMP 06/26/1985   BMI 24.75 kg/m   Body mass index is 24.75 kg/m.  Advanced Directives 08/17/2019 08/14/2018 08/12/2017 08/02/2016 08/02/2015 03/08/2015  Does Patient Have a Medical Advance Directive? Yes Yes Yes Yes Yes No;Yes  Type of Paramedic of [place];Living will Healthcare Power of [place];Living will [place]  Does patient want to make changes to medical advance directive? No - Patient declined No - Patient declined No - Patient declined - - No - Patient declined  Copy of [place] in Chart? Yes - validated most recent copy scanned in chart (See row information) Yes - validated most recent copy scanned in chart (See row information) No - copy requested No - copy requested No - copy requested No - copy requested    Tobacco Social History   Tobacco Use  Smoking Status Former Smoker  Smokeless Tobacco Never Used     Counseling given: Not Answered   Clinical Intake:  Pre-visit preparation completed: Yes        Diabetes: No  How often do you need to have someone help you when you read instructions, pamphlets, or other written materials from your doctor or pharmacy?: 1 - Never  Interpreter Needed?: No     Past Medical History:  Diagnosis Date  . Hx of degenerative disc disease   . Hypercholesterolemia   . Neuromuscular disorder ([place])    pinched nerve  . Osteoarthritis   .  Seasonal allergies   . Spinal stenosis    chronic back and leg pain   Past Surgical History:  Procedure Laterality Date  . APPENDECTOMY    . bladder tack surgery  2000  . BTL  1976  . CARPAL TUNNEL RELEASE    . CATARACT EXTRACTION W/PHACO Left 12/07/2015   Procedure: CATARACT EXTRACTION PHACO AND INTRAOCULAR LENS PLACEMENT (IOC);  Surgeon: Eulogio Bear, MD;  Location: [facility];  Service: Ophthalmology;  Laterality: Left;  [place] 1.23 AP% 23.2 CDE 19.28 Fluid Pack Lot # [id number]  . EYE SURGERY  03/19/15  . GANGLION CYST EXCISION     right hand  . GAS/FLUID EXCHANGE Left 03/08/2015   Procedure: GAS/FLUID EXCHANGE;  Surgeon: Milus Height, MD;  Location: [facility];  Service: Ophthalmology;  Laterality: Left;  . TONSILLECTOMY    . TUBAL LIGATION    . VITRECTOMY 25 GAUGE WITH SCLERAL BUCKLE Left 03/08/2015   Procedure: VITRECTOMY 25 GAUGE, membrane peel, 26 %  SF6 gas exchange;  Surgeon: Milus Height, MD;  Location: [facility];  Service: Ophthalmology;  Laterality: Left;  casette lot # [id number]   Family History  Problem Relation Age of Onset  . Heart disease Father   . Leukemia Father   . Diabetes Mother   . Heart disease Mother        myocardial infarction  . Breast cancer Sister        in her 62's  .  Cancer Sister        lung cancer  . Breast cancer Maternal Aunt   . Thyroid cancer Sister   . Colon cancer Neg Hx    Social History   Socioeconomic History  . Marital status: Widowed    Spouse name: Not on file  . Number of children: 5  . Years of education: Not on file  . Highest education level: Not on file  Occupational History  . Not on file  Tobacco Use  . Smoking status: Former Research scientist (life sciences)  . Smokeless tobacco: Never Used  Substance and Sexual Activity  . Alcohol use: Yes    Alcohol/week: 0.0 standard drinks    Comment: occassional  . Drug use: No  . Sexual activity: Never  Other Topics Concern  . Not on file  Social History Narrative  . Not on file   Social  Determinants of Health   Financial Resource Strain: Low Risk   . Difficulty of Paying Living Expenses: Not hard at all  Food Insecurity:   . Worried About Charity fundraiser in the Last Year:   . Arboriculturist in the Last Year:   Transportation Needs: No Transportation Needs  . Lack of Transportation (Medical): No  . Lack of Transportation (Non-Medical): No  Physical Activity: Insufficiently Active  . Days of Exercise per Week: 3 days  . Minutes of Exercise per Session: 20 min  Stress:   . Feeling of Stress :   Social Connections: Unknown  . Frequency of Communication with Friends and Family: More than three times a week  . Frequency of Social Gatherings with Friends and Family: More than three times a week  . Attends Religious Services: Not on file  . Active Member of Clubs or Organizations: Not on file  . Attends Archivist Meetings: Not on file  . Marital Status: Not on file    Outpatient Encounter Medications as of 08/17/2019  Medication Sig  . acetaminophen (TYLENOL) 650 MG CR tablet Take 650 mg by mouth every 8 (eight) hours as needed for pain. Reported on 08/02/2015  . Calcium Carbonate-Vitamin D (CALCIUM 500 + D PO) Take 1 tablet by mouth 2 (two) times daily.   Marland Kitchen ibuprofen (ADVIL,MOTRIN) 200 MG tablet Take 400 mg by mouth every 6 (six) hours as needed for mild pain. Reported on 08/02/2015  . Multiple Vitamin (MULTIVITAMIN) tablet Take 1 tablet by mouth daily.  Marland Kitchen OVER THE COUNTER MEDICATION Place 1 drop into both eyes every 8 (eight) hours as needed (dry eyes.). Lubricant eye drops  . Polyethylene Glycol 3350 (MIRALAX PO) Take 17 g by mouth every 3 (three) days. Every 3 days   No facility-administered encounter medications on file as of 08/17/2019.    Activities of Daily Living In your present state of health, do you have any difficulty performing the following activities: 08/17/2019  Hearing? N  Vision? N  Difficulty concentrating or making decisions? N  Walking  or climbing stairs? N  Dressing or bathing? N  Doing errands, shopping? N  Preparing Food and eating ? N  Using the Toilet? N  In the past six months, have you accidently leaked urine? N  Do you have problems with loss of bowel control? N  Managing your Medications? N  Managing your Finances? N  Housekeeping or managing your Housekeeping? N  Some recent data might be hidden    Patient Care Team: Einar Pheasant, MD as PCP - General (Internal Medicine)  Assessment:   This is a routine wellness examination for [place]. Nurse connected with patient 08/17/19 at  9:30 AM EDT by a telephone enabled telemedicine application and verified that I am speaking with the correct person using two identifiers. Patient stated full name and DOB. Patient gave permission to continue with virtual visit. Patient's location was at home and Nurse's location was at [place] office.   Patient is alert and oriented x3. Patient denies difficulty focusing or concentrating. Patient likes to read and completes word search puzzles for brain stimulation.   Health Maintenance Due: -Tdap vaccine- discussed; to be completed with doctor in visit or local pharmacy.   -Shingles vaccine- completed 1st vaccine 05/2019. Plans to get the second vaccine now that covid series has been completed 4 weeks.  -See completed HM at the end of note.   Eye: Visual acuity not assessed. Virtual visit. Followed by their ophthalmologist.  Dental: Visits every 6 months.    Hearing: Demonstrates normal hearing during visit.  Safety:  Patient feels safe at home- yes Patient does have smoke detectors at home- yes Patient does wear sunscreen or protective clothing when in direct sunlight - yes Patient does wear seat belt when in a moving vehicle - yes Patient drives- yes Adequate lighting in walkways free from debris- yes Grab bars and handrails used as appropriate- yes Ambulates with an assistive device- no Cell phone on person when  ambulating outside of the home- yes  Social: Alcohol intake - yes      Smoking history- former Smokers in home? none Illicit drug use? none  Medication: Taking as directed and without issues.  Self managed - yes   Covid-19: Precautions and sickness symptoms discussed. Wears mask, social distancing, hand hygiene as appropriate.   Activities of Daily Living Patient denies needing assistance with: household chores, feeding themselves, getting from bed to chair, getting to the toilet, bathing/showering, dressing, managing money, or preparing meals.   Discussed the importance of a healthy diet, water intake and the benefits of aerobic exercise.  Physical activity- walking, going up/down stairs.  Diet:  Regular Water: good intake Caffeine: half/half   Other Providers Patient Care Team: Einar Pheasant, MD as PCP - General (Internal Medicine)  Exercise Activities and Dietary recommendations Current Exercise Habits: Home exercise routine, Type of exercise: walking, Intensity: Mild  Goals      Patient Stated   . Maintain Healthy Lifestyle (pt-stated)     Stay hydrated Stay active Healthy diet       Fall Risk Fall Risk  08/17/2019 08/14/2018 08/12/2017 08/02/2016 02/14/2016  Falls in the past year? 0 0 No No No  Follow up Falls evaluation completed - - - -   Timed Get Up and Go performed: no, virtual visit  Depression Screen PHQ 2/9 Scores 08/17/2019 08/14/2018 08/12/2017 08/02/2016  PHQ - 2 Score 0 0 0 0     Cognitive Function MMSE - Mini Mental State Exam 08/12/2017 08/02/2016 08/02/2015  Orientation to time 5 5 5   Orientation to Place 5 5 5   Registration 3 3 3   Attention/ Calculation 5 5 5   Recall 3 3 3   Language- name 2 objects 2 2 2   Language- repeat 1 1 1   Language- follow 3 step command 3 3 3   Language- read & follow direction 1 1 1   Write a sentence 1 1 1   Copy design 1 1 1   Total score 30 30 30      6CIT Screen 08/17/2019 08/14/2018  What Year? 0 points  0 points    What month? 0 points 0 points  What time? 0 points 0 points  Count back from 20 0 points 0 points  Months in reverse 0 points 0 points  Repeat phrase - 0 points  Total Score - 0    Immunization History  Administered Date(s) Administered  . Influenza Split 02/15/2014  . Influenza, High Dose Seasonal PF 03/05/2018, 03/16/2019  . Influenza-Unspecified 03/04/2012, 03/24/2013, 03/08/2014, 02/20/2015, 03/05/2016, 03/11/2017, 03/15/2019  . Moderna SARS-COVID-2 Vaccination 06/25/2019, 07/23/2019  . Pneumococcal Conjugate-13 07/08/2013  . Pneumococcal Polysaccharide-23 07/12/2014   Screening Tests Health Maintenance  Topic Date Due  . TETANUS/TDAP  Never done  . MAMMOGRAM  04/11/2020  . INFLUENZA VACCINE  Completed  . DEXA SCAN  Completed  . PNA vac Low Risk Adult  Completed      Plan:   Keep all routine maintenance appointments.   Follow up 09/06/19 @ 4:00.  Medicare Attestation I have personally reviewed: The patient's medical and social history Their use of alcohol, tobacco or illicit drugs Their current medications and supplements The patient's functional ability including ADLs,fall risks, home safety risks, cognitive, and hearing and visual impairment Diet and physical activities Evidence for depression   I have reviewed and discussed with patient certain preventive protocols, quality metrics, and best practice recommendations.      Varney Biles, LPN  [id number]   Reviewed above information.  Agree with assessment and plan.    Dr Nicki Reaper

## 2019-08-31 DIAGNOSIS — M9904 Segmental and somatic dysfunction of sacral region: Secondary | ICD-10-CM | POA: Diagnosis not present

## 2019-08-31 DIAGNOSIS — M9903 Segmental and somatic dysfunction of lumbar region: Secondary | ICD-10-CM | POA: Diagnosis not present

## 2019-08-31 DIAGNOSIS — M5416 Radiculopathy, lumbar region: Secondary | ICD-10-CM | POA: Diagnosis not present

## 2019-08-31 DIAGNOSIS — M62830 Muscle spasm of back: Secondary | ICD-10-CM | POA: Diagnosis not present

## 2019-09-06 ENCOUNTER — Ambulatory Visit: Payer: PRIVATE HEALTH INSURANCE | Admitting: Internal Medicine

## 2019-09-10 ENCOUNTER — Other Ambulatory Visit: Payer: Self-pay

## 2019-09-10 ENCOUNTER — Ambulatory Visit (INDEPENDENT_AMBULATORY_CARE_PROVIDER_SITE_OTHER): Payer: Medicare Other | Admitting: Internal Medicine

## 2019-09-10 ENCOUNTER — Telehealth: Payer: Self-pay | Admitting: Internal Medicine

## 2019-09-10 ENCOUNTER — Encounter: Payer: Self-pay | Admitting: Internal Medicine

## 2019-09-10 VITALS — BP 126/80 | HR 84 | Temp 97.6°F | Resp 16 | Ht 67.0 in | Wt 164.0 lb

## 2019-09-10 DIAGNOSIS — E7800 Pure hypercholesterolemia, unspecified: Secondary | ICD-10-CM | POA: Diagnosis not present

## 2019-09-10 DIAGNOSIS — M4807 Spinal stenosis, lumbosacral region: Secondary | ICD-10-CM

## 2019-09-10 LAB — CBC WITH DIFFERENTIAL/PLATELET
Basophils Absolute: 0.1 10*3/uL (ref 0.0–0.1)
Basophils Relative: 1.3 % (ref 0.0–3.0)
Eosinophils Absolute: 0.1 10*3/uL (ref 0.0–0.7)
Eosinophils Relative: 1.7 % (ref 0.0–5.0)
HCT: 37.9 % (ref 36.0–46.0)
Hemoglobin: 12.7 g/dL (ref 12.0–15.0)
Lymphocytes Relative: 34.2 % (ref 12.0–46.0)
Lymphs Abs: 2.1 10*3/uL (ref 0.7–4.0)
MCHC: 33.6 g/dL (ref 30.0–36.0)
MCV: 94.7 fl (ref 78.0–100.0)
Monocytes Absolute: 0.4 10*3/uL (ref 0.1–1.0)
Monocytes Relative: 6.6 % (ref 3.0–12.0)
Neutro Abs: 3.4 10*3/uL (ref 1.4–7.7)
Neutrophils Relative %: 56.2 % (ref 43.0–77.0)
Platelets: 227 10*3/uL (ref 150.0–400.0)
RBC: 4 Mil/uL (ref 3.87–5.11)
RDW: 13.5 % (ref 11.5–15.5)
WBC: 6 10*3/uL (ref 4.0–10.5)

## 2019-09-10 LAB — COMPREHENSIVE METABOLIC PANEL
ALT: 20 U/L (ref 0–35)
AST: 18 U/L (ref 0–37)
Albumin: 4 g/dL (ref 3.5–5.2)
Alkaline Phosphatase: 55 U/L (ref 39–117)
BUN: 14 mg/dL (ref 6–23)
CO2: 30 mEq/L (ref 19–32)
Calcium: 9.3 mg/dL (ref 8.4–10.5)
Chloride: 105 mEq/L (ref 96–112)
Creatinine, Ser: 0.65 mg/dL (ref 0.40–1.20)
GFR: 86.92 mL/min (ref >60.00)
Glucose, Bld: 98 mg/dL (ref 70–99)
Potassium: 4.6 mEq/L (ref 3.5–5.1)
Sodium: 140 mEq/L (ref 135–145)
Total Bilirubin: 0.4 mg/dL (ref 0.2–1.2)
Total Protein: 6.5 g/dL (ref 6.0–8.3)

## 2019-09-10 LAB — LIPID PANEL
Cholesterol: 221 mg/dL — ABNORMAL HIGH (ref 0–200)
HDL: 69.5 mg/dL (ref >39.00)
LDL Cholesterol: 130 mg/dL — ABNORMAL HIGH (ref 0–99)
NonHDL: 151.1
Total CHOL/HDL Ratio: 3
Triglycerides: 105 mg/dL (ref 0.0–149.0)
VLDL: 21 mg/dL (ref 0.0–40.0)

## 2019-09-10 LAB — TSH: TSH: 2.35 u[IU]/mL (ref 0.35–4.50)

## 2019-09-10 NOTE — Progress Notes (Signed)
Patient ID: Deborah Baxter, female   DOB: February 26, 1934, 86 y.o.   MRN: [id number]   Subjective:    Patient ID: Deborah Baxter, female    DOB: 08-27-1933, 86 y.o.   MRN: [id number]  HPI This visit occurred during the SARS-CoV-2 public health emergency.  Safety protocols were in place, including screening questions prior to the visit, additional usage of staff PPE, and extensive cleaning of exam room while observing appropriate contact time as indicated for disinfecting solutions.  Patient here for a scheduled follow up. She reports she is doing well.  Tries to stay active.  Still with back pain.  Stable.  Desires no further evaluation or w/up.  No chest pain or sob.  No acid reflux.  No abdominal pain.  Bowels moving.  Blood pressure doing well - 128-136/78.     Past Medical History:  Diagnosis Date  . Hx of degenerative disc disease   . Hypercholesterolemia   . Neuromuscular disorder ([place])    pinched nerve  . Osteoarthritis   . Seasonal allergies   . Spinal stenosis    chronic back and leg pain   Past Surgical History:  Procedure Laterality Date  . APPENDECTOMY    . bladder tack surgery  2000  . BTL  1976  . CARPAL TUNNEL RELEASE    . CATARACT EXTRACTION W/PHACO Left 12/07/2015   Procedure: CATARACT EXTRACTION PHACO AND INTRAOCULAR LENS PLACEMENT (IOC);  Surgeon: Eulogio Bear, MD;  Location: [facility];  Service: Ophthalmology;  Laterality: Left;  [place] 1.23 AP% 23.2 CDE 19.28 Fluid Pack Lot # [id number]  . EYE SURGERY  03/19/15  . GANGLION CYST EXCISION     right hand  . GAS/FLUID EXCHANGE Left 03/08/2015   Procedure: GAS/FLUID EXCHANGE;  Surgeon: Milus Height, MD;  Location: [facility];  Service: Ophthalmology;  Laterality: Left;  . TONSILLECTOMY    . TUBAL LIGATION    . VITRECTOMY 25 GAUGE WITH SCLERAL BUCKLE Left 03/08/2015   Procedure: VITRECTOMY 25 GAUGE, membrane peel, 26 %  SF6 gas exchange;  Surgeon: Milus Height, MD;  Location: [facility];  Service: Ophthalmology;   Laterality: Left;  casette lot # [id number]   Family History  Problem Relation Age of Onset  . Heart disease Father   . Leukemia Father   . Diabetes Mother   . Heart disease Mother        myocardial infarction  . Breast cancer Sister        in her 42's  . Cancer Sister        lung cancer  . Breast cancer Maternal Aunt   . Thyroid cancer Sister   . Colon cancer Neg Hx    Social History   Socioeconomic History  . Marital status: Widowed    Spouse name: Not on file  . Number of children: 5  . Years of education: Not on file  . Highest education level: Not on file  Occupational History  . Not on file  Tobacco Use  . Smoking status: Former Research scientist (life sciences)  . Smokeless tobacco: Never Used  Substance and Sexual Activity  . Alcohol use: Yes    Alcohol/week: 0.0 standard drinks    Comment: occassional  . Drug use: No  . Sexual activity: Never  Other Topics Concern  . Not on file  Social History Narrative  . Not on file   Social Determinants of Health   Financial Resource Strain: Low Risk   . Difficulty of Paying Living Expenses: Not hard at  all  Food Insecurity:   . Worried About Charity fundraiser in the Last Year:   . Arboriculturist in the Last Year:   Transportation Needs: No Transportation Needs  . Lack of Transportation (Medical): No  . Lack of Transportation (Non-Medical): No  Physical Activity: Insufficiently Active  . Days of Exercise per Week: 3 days  . Minutes of Exercise per Session: 20 min  Stress:   . Feeling of Stress :   Social Connections: Unknown  . Frequency of Communication with Friends and Family: More than three times a week  . Frequency of Social Gatherings with Friends and Family: More than three times a week  . Attends Religious Services: Not on file  . Active Member of Clubs or Organizations: Not on file  . Attends Archivist Meetings: Not on file  . Marital Status: Not on file    Outpatient Encounter Medications as of 09/10/2019    Medication Sig  . acetaminophen (TYLENOL) 650 MG CR tablet Take 650 mg by mouth every 8 (eight) hours as needed for pain. Reported on 08/02/2015  . Calcium Carbonate-Vitamin D (CALCIUM 500 + D PO) Take 1 tablet by mouth 2 (two) times daily.   Marland Kitchen ibuprofen (ADVIL,MOTRIN) 200 MG tablet Take 400 mg by mouth every 6 (six) hours as needed for mild pain. Reported on 08/02/2015  . Multiple Vitamin (MULTIVITAMIN) tablet Take 1 tablet by mouth daily.  Marland Kitchen OVER THE COUNTER MEDICATION Place 1 drop into both eyes every 8 (eight) hours as needed (dry eyes.). Lubricant eye drops  . Polyethylene Glycol 3350 (MIRALAX PO) Take 17 g by mouth every 3 (three) days. Every 3 days   No facility-administered encounter medications on file as of 09/10/2019.    Review of Systems  Constitutional: Negative for appetite change and unexpected weight change.  HENT: Negative for congestion and sinus pressure.   Respiratory: Negative for cough, chest tightness and shortness of breath.   Cardiovascular: Negative for chest pain, palpitations and leg swelling.  Gastrointestinal: Negative for abdominal pain, diarrhea, nausea and vomiting.  Genitourinary: Negative for difficulty urinating and dysuria.  Musculoskeletal: Negative for joint swelling and myalgias.  Skin: Negative for color change and rash.  Neurological: Negative for dizziness, light-headedness and headaches.  Psychiatric/Behavioral: Negative for agitation and dysphoric mood.       Objective:    Physical Exam Constitutional:      General: She is not in acute distress.    Appearance: Normal appearance.  HENT:     Head: Normocephalic and atraumatic.     Right Ear: External ear normal.     Left Ear: External ear normal.  Eyes:     General: No scleral icterus.       Right eye: No discharge.        Left eye: No discharge.     Conjunctiva/sclera: Conjunctivae normal.  Neck:     Thyroid: No thyromegaly.  Cardiovascular:     Rate and Rhythm: Normal rate and  regular rhythm.  Pulmonary:     Effort: No respiratory distress.     Breath sounds: Normal breath sounds. No wheezing.  Abdominal:     General: Bowel sounds are normal.     Palpations: Abdomen is soft.     Tenderness: There is no abdominal tenderness.  Musculoskeletal:        General: No swelling or tenderness.     Cervical back: Neck supple. No tenderness.  Lymphadenopathy:     Cervical: No  cervical adenopathy.  Skin:    Findings: No erythema or rash.  Neurological:     Mental Status: She is alert.  Psychiatric:        Mood and Affect: Mood normal.        Behavior: Behavior normal.     BP 126/80   Pulse 84   Temp 97.6 F (36.4 C)   Resp 16   Ht 5\' 7"  (1.702 m)   Wt 164 lb (74.4 kg)   LMP 06/26/1985   SpO2 97%   BMI 25.69 kg/m  Wt Readings from Last 3 Encounters:  09/10/19 164 lb (74.4 kg)  08/17/19 158 lb (71.7 kg)  02/18/19 158 lb (71.7 kg)     Lab Results  Component Value Date   WBC 6.0 09/10/2019   HGB 12.7 09/10/2019   HCT 37.9 09/10/2019   PLT 227.0 09/10/2019   GLUCOSE 98 09/10/2019   CHOL 221 (H) 09/10/2019   TRIG 105.0 09/10/2019   HDL 69.50 09/10/2019   LDLDIRECT 104.0 08/12/2017   LDLCALC 130 (H) 09/10/2019   ALT 20 09/10/2019   AST 18 09/10/2019   NA 140 09/10/2019   K 4.6 09/10/2019   CL 105 09/10/2019   CREATININE 0.65 09/10/2019   BUN 14 09/10/2019   CO2 30 09/10/2019   TSH 2.35 09/10/2019    MM 3D SCREEN BREAST BILATERAL  Result Date: 04/13/2019 CLINICAL DATA:  Screening. EXAM: DIGITAL SCREENING BILATERAL MAMMOGRAM WITH TOMO AND CAD COMPARISON:  Previous exam(s). ACR Breast Density Category c: The breast tissue is heterogeneously dense, which may obscure small masses. FINDINGS: There are no findings suspicious for malignancy. Images were processed with CAD. IMPRESSION: No mammographic evidence of malignancy. A result letter of this screening mammogram will be mailed directly to the patient. RECOMMENDATION: Screening mammogram in one  year. (Code:SM-B-01Y) BI-RADS CATEGORY  1: Negative. Electronically Signed   By: Nolon Nations M.D.   On: 04/13/2019 10:52       Assessment & Plan:   Problem List Items Addressed This Visit    Hypercholesterolemia - Primary    Low cholesterol diet and exercise.  Has declined to start cholesterol medication.  Follow lipid panel.       Relevant Orders   CBC with Differential/Platelet (Completed)   Comprehensive metabolic panel (Completed)   Lipid panel (Completed)   TSH (Completed)   Spinal stenosis    Has known spinal stenosis.  Has seen Dr Sharlet Salina.  S/p injections.  Stable.  Desires no further intervention.  Follow.           Einar Pheasant, MD

## 2019-09-10 NOTE — Telephone Encounter (Signed)
noted 

## 2019-09-10 NOTE — Telephone Encounter (Signed)
Pt called in and said she received 2nd Shingrix at [facility] near [facility]. She wanted Dr. Nicki Reaper to know.

## 2019-09-12 ENCOUNTER — Encounter: Payer: Self-pay | Admitting: Internal Medicine

## 2019-09-12 NOTE — Assessment & Plan Note (Signed)
Low cholesterol diet and exercise.  Has declined to start cholesterol medication.  Follow lipid panel.

## 2019-09-12 NOTE — Assessment & Plan Note (Signed)
Has known spinal stenosis.  Has seen Dr Chasnis.  S/p injections.  Stable. Desires no further intervention.  Follow.  

## 2019-09-28 DIAGNOSIS — M62830 Muscle spasm of back: Secondary | ICD-10-CM | POA: Diagnosis not present

## 2019-09-28 DIAGNOSIS — M5416 Radiculopathy, lumbar region: Secondary | ICD-10-CM | POA: Diagnosis not present

## 2019-09-28 DIAGNOSIS — M9904 Segmental and somatic dysfunction of sacral region: Secondary | ICD-10-CM | POA: Diagnosis not present

## 2019-09-28 DIAGNOSIS — M9903 Segmental and somatic dysfunction of lumbar region: Secondary | ICD-10-CM | POA: Diagnosis not present

## 2019-10-26 DIAGNOSIS — M5416 Radiculopathy, lumbar region: Secondary | ICD-10-CM | POA: Diagnosis not present

## 2019-10-26 DIAGNOSIS — M62830 Muscle spasm of back: Secondary | ICD-10-CM | POA: Diagnosis not present

## 2019-10-26 DIAGNOSIS — M9903 Segmental and somatic dysfunction of lumbar region: Secondary | ICD-10-CM | POA: Diagnosis not present

## 2019-10-26 DIAGNOSIS — M9904 Segmental and somatic dysfunction of sacral region: Secondary | ICD-10-CM | POA: Diagnosis not present

## 2019-12-21 DIAGNOSIS — H524 Presbyopia: Secondary | ICD-10-CM | POA: Diagnosis not present

## 2019-12-21 DIAGNOSIS — Z961 Presence of intraocular lens: Secondary | ICD-10-CM | POA: Diagnosis not present

## 2019-12-22 DIAGNOSIS — M62830 Muscle spasm of back: Secondary | ICD-10-CM | POA: Diagnosis not present

## 2019-12-22 DIAGNOSIS — M9903 Segmental and somatic dysfunction of lumbar region: Secondary | ICD-10-CM | POA: Diagnosis not present

## 2019-12-22 DIAGNOSIS — M5416 Radiculopathy, lumbar region: Secondary | ICD-10-CM | POA: Diagnosis not present

## 2019-12-22 DIAGNOSIS — M9904 Segmental and somatic dysfunction of sacral region: Secondary | ICD-10-CM | POA: Diagnosis not present

## 2020-01-25 DIAGNOSIS — M5416 Radiculopathy, lumbar region: Secondary | ICD-10-CM | POA: Diagnosis not present

## 2020-01-25 DIAGNOSIS — M9904 Segmental and somatic dysfunction of sacral region: Secondary | ICD-10-CM | POA: Diagnosis not present

## 2020-01-25 DIAGNOSIS — M62830 Muscle spasm of back: Secondary | ICD-10-CM | POA: Diagnosis not present

## 2020-01-25 DIAGNOSIS — M9903 Segmental and somatic dysfunction of lumbar region: Secondary | ICD-10-CM | POA: Diagnosis not present

## 2020-02-22 DIAGNOSIS — M9903 Segmental and somatic dysfunction of lumbar region: Secondary | ICD-10-CM | POA: Diagnosis not present

## 2020-02-22 DIAGNOSIS — M62830 Muscle spasm of back: Secondary | ICD-10-CM | POA: Diagnosis not present

## 2020-02-22 DIAGNOSIS — M5416 Radiculopathy, lumbar region: Secondary | ICD-10-CM | POA: Diagnosis not present

## 2020-02-22 DIAGNOSIS — M9904 Segmental and somatic dysfunction of sacral region: Secondary | ICD-10-CM | POA: Diagnosis not present

## 2020-03-06 ENCOUNTER — Telehealth: Payer: Self-pay | Admitting: Internal Medicine

## 2020-03-06 DIAGNOSIS — Z1231 Encounter for screening mammogram for malignant neoplasm of breast: Secondary | ICD-10-CM

## 2020-03-06 NOTE — Addendum Note (Signed)
Addended by: Alisa Graff on: 03/06/2020 10:15 PM   Modules accepted: Orders

## 2020-03-06 NOTE — Telephone Encounter (Signed)
Pt needs an order placed for a mammogram to [facility]

## 2020-03-06 NOTE — Telephone Encounter (Signed)
Order placed for mammogram.

## 2020-03-21 DIAGNOSIS — M9904 Segmental and somatic dysfunction of sacral region: Secondary | ICD-10-CM | POA: Diagnosis not present

## 2020-03-21 DIAGNOSIS — M62830 Muscle spasm of back: Secondary | ICD-10-CM | POA: Diagnosis not present

## 2020-03-21 DIAGNOSIS — M9903 Segmental and somatic dysfunction of lumbar region: Secondary | ICD-10-CM | POA: Diagnosis not present

## 2020-03-21 DIAGNOSIS — M9902 Segmental and somatic dysfunction of thoracic region: Secondary | ICD-10-CM | POA: Diagnosis not present

## 2020-04-18 DIAGNOSIS — M9903 Segmental and somatic dysfunction of lumbar region: Secondary | ICD-10-CM | POA: Diagnosis not present

## 2020-04-18 DIAGNOSIS — M9904 Segmental and somatic dysfunction of sacral region: Secondary | ICD-10-CM | POA: Diagnosis not present

## 2020-04-18 DIAGNOSIS — M62830 Muscle spasm of back: Secondary | ICD-10-CM | POA: Diagnosis not present

## 2020-04-18 DIAGNOSIS — M9902 Segmental and somatic dysfunction of thoracic region: Secondary | ICD-10-CM | POA: Diagnosis not present

## 2020-04-24 ENCOUNTER — Telehealth: Payer: Self-pay

## 2020-04-24 DIAGNOSIS — Z23 Encounter for immunization: Secondary | ICD-10-CM | POA: Diagnosis not present

## 2020-04-24 NOTE — Telephone Encounter (Signed)
FYI-pt called to state that she had her flu shot today @ [facility] in [place].

## 2020-04-24 NOTE — Telephone Encounter (Signed)
Noted in chart.

## 2020-05-03 ENCOUNTER — Encounter: Payer: Self-pay | Admitting: Internal Medicine

## 2020-05-10 DIAGNOSIS — Z23 Encounter for immunization: Secondary | ICD-10-CM | POA: Diagnosis not present

## 2020-05-16 DIAGNOSIS — M9902 Segmental and somatic dysfunction of thoracic region: Secondary | ICD-10-CM | POA: Diagnosis not present

## 2020-05-16 DIAGNOSIS — M62830 Muscle spasm of back: Secondary | ICD-10-CM | POA: Diagnosis not present

## 2020-05-16 DIAGNOSIS — M9904 Segmental and somatic dysfunction of sacral region: Secondary | ICD-10-CM | POA: Diagnosis not present

## 2020-05-16 DIAGNOSIS — M9903 Segmental and somatic dysfunction of lumbar region: Secondary | ICD-10-CM | POA: Diagnosis not present

## 2020-05-30 ENCOUNTER — Ambulatory Visit
Admission: RE | Admit: 2020-05-30 | Discharge: 2020-05-30 | Disposition: A | Discharge status: Home / Self Care | Payer: Medicare Other | Source: Ambulatory Visit | Attending: Internal Medicine | Admitting: Internal Medicine

## 2020-05-30 ENCOUNTER — Other Ambulatory Visit: Payer: Self-pay

## 2020-05-30 DIAGNOSIS — Z1231 Encounter for screening mammogram for malignant neoplasm of breast: Secondary | ICD-10-CM | POA: Insufficient documentation

## 2020-05-31 ENCOUNTER — Telehealth: Payer: Self-pay | Admitting: Internal Medicine

## 2020-05-31 NOTE — Telephone Encounter (Signed)
LMTCB and schedule a CPE

## 2020-05-31 NOTE — Telephone Encounter (Signed)
Pt overdue physical.  Please schedule.  Thanks.

## 2020-06-13 DIAGNOSIS — M62830 Muscle spasm of back: Secondary | ICD-10-CM | POA: Diagnosis not present

## 2020-06-13 DIAGNOSIS — M9902 Segmental and somatic dysfunction of thoracic region: Secondary | ICD-10-CM | POA: Diagnosis not present

## 2020-06-13 DIAGNOSIS — M9904 Segmental and somatic dysfunction of sacral region: Secondary | ICD-10-CM | POA: Diagnosis not present

## 2020-06-13 DIAGNOSIS — M9903 Segmental and somatic dysfunction of lumbar region: Secondary | ICD-10-CM | POA: Diagnosis not present

## 2020-07-11 DIAGNOSIS — M9904 Segmental and somatic dysfunction of sacral region: Secondary | ICD-10-CM | POA: Diagnosis not present

## 2020-07-11 DIAGNOSIS — M9902 Segmental and somatic dysfunction of thoracic region: Secondary | ICD-10-CM | POA: Diagnosis not present

## 2020-07-11 DIAGNOSIS — M9903 Segmental and somatic dysfunction of lumbar region: Secondary | ICD-10-CM | POA: Diagnosis not present

## 2020-07-11 DIAGNOSIS — M62830 Muscle spasm of back: Secondary | ICD-10-CM | POA: Diagnosis not present

## 2020-07-31 ENCOUNTER — Ambulatory Visit (INDEPENDENT_AMBULATORY_CARE_PROVIDER_SITE_OTHER): Payer: Medicare Other | Admitting: Internal Medicine

## 2020-07-31 ENCOUNTER — Other Ambulatory Visit: Payer: Self-pay

## 2020-07-31 ENCOUNTER — Encounter: Payer: Self-pay | Admitting: Internal Medicine

## 2020-07-31 VITALS — BP 124/70 | HR 89 | Temp 97.9°F | Resp 16 | Ht 67.0 in | Wt 165.0 lb

## 2020-07-31 DIAGNOSIS — E7800 Pure hypercholesterolemia, unspecified: Secondary | ICD-10-CM

## 2020-07-31 DIAGNOSIS — M4807 Spinal stenosis, lumbosacral region: Secondary | ICD-10-CM

## 2020-07-31 DIAGNOSIS — Z0000 Encounter for general adult medical examination without abnormal findings: Secondary | ICD-10-CM | POA: Diagnosis not present

## 2020-07-31 NOTE — Progress Notes (Signed)
Patient ID: Deborah Baxter, female   DOB: 01-07-1934, 87 y.o.   MRN: [id number]   Subjective:    Patient ID: Deborah Baxter, female    DOB: 1933-12-10, 87 y.o.   MRN: [id number]  HPI This visit occurred during the SARS-CoV-2 public health emergency.  Safety protocols were in place, including screening questions prior to the visit, additional usage of staff PPE, and extensive cleaning of exam room while observing appropriate contact time as indicated for disinfecting solutions.  Patient with past history of hypercholesterolemia and spinal stenosis.  She comes in today to follow up on these issues as well as for a complete physical exam.  She reports she is doing relatively well.  Tries to stay active.  Still with lower back/buttock and leg pain.  Sees chiropractor prn.  Feels is stable.  Desires no further intervention.  No chest pain or sob reported.  No abdominal pain or bowel change reported.  Takes miralax q third day.  Keeps bowels regular.  Blood pressure doing well. Right arm - small superficial abrasion.  bandaid in place. No surrounding erythema.   Past Medical History:  Diagnosis Date  . Hx of degenerative disc disease   . Hypercholesterolemia   . Neuromuscular disorder ([place])    pinched nerve  . Osteoarthritis   . Seasonal allergies   . Spinal stenosis    chronic back and leg pain   Past Surgical History:  Procedure Laterality Date  . APPENDECTOMY    . bladder tack surgery  2000  . BTL  1976  . CARPAL TUNNEL RELEASE    . CATARACT EXTRACTION W/PHACO Left 12/07/2015   Procedure: CATARACT EXTRACTION PHACO AND INTRAOCULAR LENS PLACEMENT (IOC);  Surgeon: Eulogio Bear, MD;  Location: [facility];  Service: Ophthalmology;  Laterality: Left;  [place] 1.23 AP% 23.2 CDE 19.28 Fluid Pack Lot # [id number]  . EYE SURGERY  03/19/15  . GANGLION CYST EXCISION     right hand  . GAS/FLUID EXCHANGE Left 03/08/2015   Procedure: GAS/FLUID EXCHANGE;  Surgeon: Milus Height, MD;  Location: [facility];   Service: Ophthalmology;  Laterality: Left;  . TONSILLECTOMY    . TUBAL LIGATION    . VITRECTOMY 25 GAUGE WITH SCLERAL BUCKLE Left 03/08/2015   Procedure: VITRECTOMY 25 GAUGE, membrane peel, 26 %  SF6 gas exchange;  Surgeon: Milus Height, MD;  Location: [facility];  Service: Ophthalmology;  Laterality: Left;  casette lot # [id number]   Family History  Problem Relation Age of Onset  . Heart disease Father   . Leukemia Father   . Diabetes Mother   . Heart disease Mother        myocardial infarction  . Breast cancer Sister        in her 55's  . Cancer Sister        lung cancer  . Breast cancer Maternal Aunt   . Thyroid cancer Sister   . Colon cancer Neg Hx    Social History   Socioeconomic History  . Marital status: Widowed    Spouse name: Not on file  . Number of children: 5  . Years of education: Not on file  . Highest education level: Not on file  Occupational History  . Not on file  Tobacco Use  . Smoking status: Former Research scientist (life sciences)  . Smokeless tobacco: Never Used  Vaping Use  . Vaping Use: Never used  Substance and Sexual Activity  . Alcohol use: Yes    Alcohol/week: 0.0 standard drinks  Comment: occassional  . Drug use: No  . Sexual activity: Never  Other Topics Concern  . Not on file  Social History Narrative  . Not on file   Social Determinants of Health   Financial Resource Strain: Low Risk   . Difficulty of Paying Living Expenses: Not hard at all  Food Insecurity: Not on file  Transportation Needs: No Transportation Needs  . Lack of Transportation (Medical): No  . Lack of Transportation (Non-Medical): No  Physical Activity: Insufficiently Active  . Days of Exercise per Week: 3 days  . Minutes of Exercise per Session: 20 min  Stress: Not on file  Social Connections: Unknown  . Frequency of Communication with Friends and Family: More than three times a week  . Frequency of Social Gatherings with Friends and Family: More than three times a week  . Attends  Religious Services: Not on file  . Active Member of Clubs or Organizations: Not on file  . Attends Archivist Meetings: Not on file  . Marital Status: Not on file    Outpatient Encounter Medications as of 07/31/2020  Medication Sig  . acetaminophen (TYLENOL) 650 MG CR tablet Take 650 mg by mouth every 8 (eight) hours as needed for pain. Reported on 08/02/2015  . Calcium Carbonate-Vitamin D (CALCIUM 500 + D PO) Take 1 tablet by mouth 2 (two) times daily.   Marland Kitchen ibuprofen (ADVIL,MOTRIN) 200 MG tablet Take 400 mg by mouth every 6 (six) hours as needed for mild pain. Reported on 08/02/2015  . Multiple Vitamin (MULTIVITAMIN) tablet Take 1 tablet by mouth daily.  Marland Kitchen OVER THE COUNTER MEDICATION Place 1 drop into both eyes every 8 (eight) hours as needed (dry eyes.). Lubricant eye drops  . Polyethylene Glycol 3350 (MIRALAX PO) Take 17 g by mouth every 3 (three) days. Every 3 days   No facility-administered encounter medications on file as of 07/31/2020.    Review of Systems  Constitutional: Negative for appetite change and unexpected weight change.  HENT: Negative for congestion, sinus pressure and sore throat.   Eyes: Negative for pain and visual disturbance.  Respiratory: Negative for cough, chest tightness and shortness of breath.   Cardiovascular: Negative for chest pain, palpitations and leg swelling.  Gastrointestinal: Negative for abdominal pain, diarrhea, nausea and vomiting.       Discussed miralax for bowels.    Genitourinary: Negative for difficulty urinating and dysuria.  Musculoskeletal: Positive for back pain. Negative for joint swelling.  Skin: Negative for color change and rash.  Neurological: Negative for dizziness, light-headedness and headaches.  Hematological: Negative for adenopathy. Does not bruise/bleed easily.  Psychiatric/Behavioral: Negative for agitation and dysphoric mood.       Objective:    Physical Exam Vitals reviewed.  Constitutional:      General: She  is not in acute distress.    Appearance: Normal appearance. She is well-developed and well-nourished.  HENT:     Head: Normocephalic and atraumatic.     Right Ear: External ear normal.     Left Ear: External ear normal.     Mouth/Throat:     Mouth: Oropharynx is clear and moist.  Eyes:     General: No scleral icterus.       Right eye: No discharge.        Left eye: No discharge.     Conjunctiva/sclera: Conjunctivae normal.  Neck:     Thyroid: No thyromegaly.  Cardiovascular:     Rate and Rhythm: Normal rate and regular rhythm.  Pulmonary:     Effort: No tachypnea, accessory muscle usage or respiratory distress.     Breath sounds: Normal breath sounds. No decreased breath sounds or wheezing.  Chest:  Breasts:     Right: No inverted nipple, mass, nipple discharge or tenderness (no axillary adenopathy).     Left: No inverted nipple, mass, nipple discharge or tenderness (no axilarry adenopathy).    Abdominal:     General: Bowel sounds are normal.     Palpations: Abdomen is soft.     Tenderness: There is no abdominal tenderness.  Musculoskeletal:        General: No swelling, tenderness or edema.     Cervical back: Neck supple. No tenderness.  Lymphadenopathy:     Cervical: No cervical adenopathy.  Skin:    Findings: No erythema or rash.     Comments: Small abrasion - right forearm.  No surrounding erythema.   Neurological:     Mental Status: She is alert and oriented to person, place, and time.  Psychiatric:        Mood and Affect: Mood and affect and mood normal.        Behavior: Behavior normal.     BP 124/70   Pulse 89   Temp 97.9 F (36.6 C) (Oral)   Resp 16   Ht 5\' 7"  (1.702 m)   Wt 165 lb (74.8 kg)   LMP 06/26/1985   SpO2 98%   BMI 25.84 kg/m  Wt Readings from Last 3 Encounters:  07/31/20 165 lb (74.8 kg)  09/10/19 164 lb (74.4 kg)  08/17/19 158 lb (71.7 kg)     Lab Results  Component Value Date   WBC 6.6 07/31/2020   HGB 13.6 07/31/2020   HCT 40.9  07/31/2020   PLT 237.0 07/31/2020   GLUCOSE 86 07/31/2020   CHOL 224 (H) 07/31/2020   TRIG 99.0 07/31/2020   HDL 85.10 07/31/2020   LDLDIRECT 104.0 08/12/2017   LDLCALC 119 (H) 07/31/2020   ALT 16 07/31/2020   AST 19 07/31/2020   NA 137 07/31/2020   K 4.6 07/31/2020   CL 100 07/31/2020   CREATININE 0.62 07/31/2020   BUN 14 07/31/2020   CO2 31 07/31/2020   TSH 1.74 07/31/2020    MM 3D SCREEN BREAST BILATERAL  Result Date: 05/30/2020 CLINICAL DATA:  Screening. EXAM: DIGITAL SCREENING BILATERAL MAMMOGRAM WITH TOMO AND CAD COMPARISON:  Previous exam(s). ACR Breast Density Category b: There are scattered areas of fibroglandular density. FINDINGS: There are no findings suspicious for malignancy. Images were processed with CAD. IMPRESSION: No mammographic evidence of malignancy. A result letter of this screening mammogram will be mailed directly to the patient. RECOMMENDATION: Screening mammogram in one year. (Code:SM-B-01Y) BI-RADS CATEGORY  1: Negative. Electronically Signed   By: Abelardo Diesel M.D.   On: 05/30/2020 15:12       Assessment & Plan:   Problem List Items Addressed This Visit    Health care maintenance    Physical today 07/31/20.  Colonoscopy 08/2012.  Mammogram 05/30/20 - Birads I.       Hypercholesterolemia - Primary    Low cholesterol diet and exercise.  Have discussed calculated cholesterol risk and recommendation to start cholesterol medication.  She declines.  Follow lipid panel.        Relevant Orders   Comprehensive metabolic panel (Completed)   CBC with Differential/Platelet (Completed)   TSH (Completed)   Lipid panel (Completed)   Spinal stenosis    Has known spinal stenosis.  Has seen Dr Sharlet Salina.  S/p injections.  Stable.  Desires no further intervention.  Follow.           Einar Pheasant, MD

## 2020-07-31 NOTE — Assessment & Plan Note (Signed)
Physical today 07/31/20.  Colonoscopy 08/2012.  Mammogram 05/30/20 - Birads I.

## 2020-08-01 LAB — CBC WITH DIFFERENTIAL/PLATELET
Basophils Absolute: 0.1 10*3/uL (ref 0.0–0.1)
Basophils Relative: 0.8 % (ref 0.0–3.0)
Eosinophils Absolute: 0.1 10*3/uL (ref 0.0–0.7)
Eosinophils Relative: 1 % (ref 0.0–5.0)
HCT: 40.9 % (ref 36.0–46.0)
Hemoglobin: 13.6 g/dL (ref 12.0–15.0)
Lymphocytes Relative: 33.6 % (ref 12.0–46.0)
Lymphs Abs: 2.2 10*3/uL (ref 0.7–4.0)
MCHC: 33.2 g/dL (ref 30.0–36.0)
MCV: 92.6 fl (ref 78.0–100.0)
Monocytes Absolute: 0.5 10*3/uL (ref 0.1–1.0)
Monocytes Relative: 7.5 % (ref 3.0–12.0)
Neutro Abs: 3.8 10*3/uL (ref 1.4–7.7)
Neutrophils Relative %: 57.1 % (ref 43.0–77.0)
Platelets: 237 10*3/uL (ref 150.0–400.0)
RBC: 4.42 Mil/uL (ref 3.87–5.11)
RDW: 13.6 % (ref 11.5–15.5)
WBC: 6.6 10*3/uL (ref 4.0–10.5)

## 2020-08-01 LAB — COMPREHENSIVE METABOLIC PANEL
ALT: 16 U/L (ref 0–35)
AST: 19 U/L (ref 0–37)
Albumin: 4.2 g/dL (ref 3.5–5.2)
Alkaline Phosphatase: 51 U/L (ref 39–117)
BUN: 14 mg/dL (ref 6–23)
CO2: 31 mEq/L (ref 19–32)
Calcium: 9.8 mg/dL (ref 8.4–10.5)
Chloride: 100 mEq/L (ref 96–112)
Creatinine, Ser: 0.62 mg/dL (ref 0.40–1.20)
GFR: 81.72 mL/min (ref >60.00)
Glucose, Bld: 86 mg/dL (ref 70–99)
Potassium: 4.6 mEq/L (ref 3.5–5.1)
Sodium: 137 mEq/L (ref 135–145)
Total Bilirubin: 0.6 mg/dL (ref 0.2–1.2)
Total Protein: 6.8 g/dL (ref 6.0–8.3)

## 2020-08-01 LAB — LIPID PANEL
Cholesterol: 224 mg/dL — ABNORMAL HIGH (ref 0–200)
HDL: 85.1 mg/dL (ref >39.00)
LDL Cholesterol: 119 mg/dL — ABNORMAL HIGH (ref 0–99)
NonHDL: 138.83
Total CHOL/HDL Ratio: 3
Triglycerides: 99 mg/dL (ref 0.0–149.0)
VLDL: 19.8 mg/dL (ref 0.0–40.0)

## 2020-08-01 LAB — TSH: TSH: 1.74 u[IU]/mL (ref 0.35–4.50)

## 2020-08-06 ENCOUNTER — Encounter: Payer: Self-pay | Admitting: Internal Medicine

## 2020-08-06 NOTE — Assessment & Plan Note (Signed)
Low cholesterol diet and exercise.  Have discussed calculated cholesterol risk and recommendation to start cholesterol medication.  She declines.  Follow lipid panel.   

## 2020-08-06 NOTE — Assessment & Plan Note (Signed)
Has known spinal stenosis.  Has seen Dr Chasnis.  S/p injections.  Stable. Desires no further intervention.  Follow.  

## 2020-08-08 DIAGNOSIS — M9905 Segmental and somatic dysfunction of pelvic region: Secondary | ICD-10-CM | POA: Diagnosis not present

## 2020-08-08 DIAGNOSIS — M9903 Segmental and somatic dysfunction of lumbar region: Secondary | ICD-10-CM | POA: Diagnosis not present

## 2020-08-08 DIAGNOSIS — M5136 Other intervertebral disc degeneration, lumbar region: Secondary | ICD-10-CM | POA: Diagnosis not present

## 2020-08-08 DIAGNOSIS — M955 Acquired deformity of pelvis: Secondary | ICD-10-CM | POA: Diagnosis not present

## 2020-08-17 ENCOUNTER — Ambulatory Visit (INDEPENDENT_AMBULATORY_CARE_PROVIDER_SITE_OTHER): Payer: Medicare Other

## 2020-08-17 VITALS — Ht 67.0 in | Wt 165.0 lb

## 2020-08-17 DIAGNOSIS — Z0000 Encounter for general adult medical examination without abnormal findings: Secondary | ICD-10-CM | POA: Diagnosis not present

## 2020-08-17 NOTE — Progress Notes (Signed)
Subjective:   Deborah Baxter is a 87 y.o. female who presents for Medicare Annual (Subsequent) preventive examination.  Review of Systems    No ROS.  Medicare Wellness Virtual Visit.    Cardiac Risk Factors include: advanced age (>29men, >72 women)     Objective:    Today's Vitals   08/17/20 0933  Weight: 165 lb (74.8 kg)  Height: 5\' 7"  (1.702 m)   Body mass index is 25.84 kg/m.  Advanced Directives 08/17/2020 08/17/2019 08/14/2018 08/12/2017 08/02/2016 08/02/2015 03/08/2015  Does Patient Have a Medical Advance Directive? Yes Yes Yes Yes Yes Yes No;Yes  Type of Paramedic of [place];Living will [place];Living will Healthcare Power of [place];Living will [place]  Does patient want to make changes to medical advance directive? No - Patient declined No - Patient declined No - Patient declined No - Patient declined - - No - Patient declined  Copy of [place] in Chart? Yes - validated most recent copy scanned in chart (See row information) Yes - validated most recent copy scanned in chart (See row information) Yes - validated most recent copy scanned in chart (See row information) No - copy requested No - copy requested No - copy requested No - copy requested    Current Medications (verified) Outpatient Encounter Medications as of 08/17/2020  Medication Sig  . acetaminophen (TYLENOL) 650 MG CR tablet Take 650 mg by mouth every 8 (eight) hours as needed for pain. Reported on 08/02/2015  . Calcium Carbonate-Vitamin D (CALCIUM 500 + D PO) Take 1 tablet by mouth 2 (two) times daily.   Marland Kitchen ibuprofen (ADVIL,MOTRIN) 200 MG tablet Take 400 mg by mouth every 6 (six) hours as needed for mild pain. Reported on 08/02/2015  . Multiple Vitamin (MULTIVITAMIN) tablet Take 1 tablet by mouth daily.  Marland Kitchen OVER THE COUNTER MEDICATION Place  1 drop into both eyes every 8 (eight) hours as needed (dry eyes.). Lubricant eye drops  . Polyethylene Glycol 3350 (MIRALAX PO) Take 17 g by mouth every 3 (three) days. Every 3 days   No facility-administered encounter medications on file as of 08/17/2020.    Allergies (verified) Patient has no known allergies.   History: Past Medical History:  Diagnosis Date  . Hx of degenerative disc disease   . Hypercholesterolemia   . Neuromuscular disorder ([place])    pinched nerve  . Osteoarthritis   . Seasonal allergies   . Spinal stenosis    chronic back and leg pain   Past Surgical History:  Procedure Laterality Date  . APPENDECTOMY    . bladder tack surgery  2000  . BTL  1976  . CARPAL TUNNEL RELEASE    . CATARACT EXTRACTION W/PHACO Left 12/07/2015   Procedure: CATARACT EXTRACTION PHACO AND INTRAOCULAR LENS PLACEMENT (IOC);  Surgeon: Eulogio Bear, MD;  Location: [facility] ORS;  Service: Ophthalmology;  Laterality: Left;  [place] 1.23 AP% 23.2 CDE 19.28 Fluid Pack Lot # [id number]  . EYE SURGERY  03/19/15  . GANGLION CYST EXCISION     right hand  . GAS/FLUID EXCHANGE Left 03/08/2015   Procedure: GAS/FLUID EXCHANGE;  Surgeon: Milus Height, MD;  Location: [facility] ORS;  Service: Ophthalmology;  Laterality: Left;  . TONSILLECTOMY    . TUBAL LIGATION    . VITRECTOMY 25 GAUGE WITH SCLERAL BUCKLE Left 03/08/2015   Procedure: VITRECTOMY 25 GAUGE, membrane peel, 26 %  SF6 gas exchange;  Surgeon: Milus Height, MD;  Location: [facility] ORS;  Service: Ophthalmology;  Laterality: Left;  casette lot # [id number]   Family History  Problem Relation Age of Onset  . Heart disease Father   . Leukemia Father   . Diabetes Mother   . Heart disease Mother        myocardial infarction  . Breast cancer Sister        in her 38's  . Cancer Sister        lung cancer  . Breast cancer Maternal Aunt   . Thyroid cancer Sister   . Colon cancer Neg Hx    Social History   Socioeconomic History  . Marital status:  Widowed    Spouse name: Not on file  . Number of children: 5  . Years of education: Not on file  . Highest education level: Not on file  Occupational History  . Not on file  Tobacco Use  . Smoking status: Former Research scientist (life sciences)  . Smokeless tobacco: Never Used  Vaping Use  . Vaping Use: Never used  Substance and Sexual Activity  . Alcohol use: Yes    Alcohol/week: 0.0 standard drinks    Comment: occassional  . Drug use: No  . Sexual activity: Never  Other Topics Concern  . Not on file  Social History Narrative  . Not on file   Social Determinants of Health   Financial Resource Strain: Low Risk   . Difficulty of Paying Living Expenses: Not hard at all  Food Insecurity: No Food Insecurity  . Worried About Charity fundraiser in the Last Year: Never true  . Ran Out of Food in the Last Year: Never true  Transportation Needs: No Transportation Needs  . Lack of Transportation (Medical): No  . Lack of Transportation (Non-Medical): No  Physical Activity: Insufficiently Active  . Days of Exercise per Week: 3 days  . Minutes of Exercise per Session: 20 min  Stress: No Stress Concern Present  . Feeling of Stress : Not at all  Social Connections: Unknown  . Frequency of Communication with Friends and Family: More than three times a week  . Frequency of Social Gatherings with Friends and Family: More than three times a week  . Attends Religious Services: Not on file  . Active Member of Clubs or Organizations: Not on file  . Attends Archivist Meetings: Not on file  . Marital Status: Not on file    Tobacco Counseling Counseling given: Not Answered   Clinical Intake:  Pre-visit preparation completed: Yes        Diabetes: No  How often do you need to have someone help you when you read instructions, pamphlets, or other written materials from your doctor or pharmacy?: 1 - Never    Interpreter Needed?: No      Activities of Daily Living In your present state of  health, do you have any difficulty performing the following activities: 08/17/2020  Hearing? N  Vision? N  Difficulty concentrating or making decisions? N  Walking or climbing stairs? N  Dressing or bathing? N  Doing errands, shopping? N  Preparing Food and eating ? N  Using the Toilet? N  In the past six months, have you accidently leaked urine? Y  Comment Managed with daily brief/liner  Do you have problems with loss of bowel control? N  Managing your Medications? N  Managing your Finances? N  Housekeeping or managing your Housekeeping? N  Some recent  data might be hidden    Patient Care Team: Einar Pheasant, MD as PCP - General (Internal Medicine)  Indicate any recent Medical Services you may have received from other than [facility] providers in the past year (date may be approximate).     Assessment:   This is a routine wellness examination for Ayden.  I connected with Oriah today by telephone and verified that I am speaking with the correct person using two identifiers. Location patient: home Location provider: work Persons participating in the virtual visit: patient, Marine scientist.    I discussed the limitations, risks, security and privacy concerns of performing an evaluation and management service by telephone and the availability of in person appointments. The patient expressed understanding and verbally consented to this telephonic visit.    Interactive audio and video telecommunications were attempted between this provider and patient, however failed, due to patient having technical difficulties OR patient did not have access to video capability.  We continued and completed visit with audio only.  Some vital signs may be absent or patient reported.   Hearing/Vision screen  Hearing Screening   125Hz  250Hz  500Hz  1000Hz  2000Hz  3000Hz  4000Hz  6000Hz  8000Hz   Right ear:           Left ear:           Comments: Patient is able to hear conversational tones without difficulty.  No issues  reported.  Vision Screening Comments: Followed by [facility]  Wears corrective lenses  Cataract extraction, L eye only  Dietary issues and exercise activities discussed: Current Exercise Habits: Home exercise routine, Intensity: Mild  Healthy diet Good water intake  Goals      Patient Stated   .  Maintain Healthy Lifestyle (pt-stated)      Stay hydrated Stay active Healthy diet      Depression Screen PHQ 2/9 Scores 08/17/2020 08/17/2019 08/14/2018 08/12/2017 08/02/2016 02/14/2016 08/02/2015  PHQ - 2 Score 0 0 0 0 0 0 0    Fall Risk Fall Risk  08/17/2020 08/17/2019 08/14/2018 08/12/2017 08/02/2016  Falls in the past year? 0 0 0 No No  Number falls in past yr: 0 - - - -  Injury with Fall? 0 - - - -  Follow up Falls evaluation completed Falls evaluation completed - - -    FALL RISK PREVENTION PERTAINING TO THE HOME: Handrails in use when climbing stairs? Yes Home free of loose throw rugs in walkways, pet beds, electrical cords, etc? Yes  Adequate lighting in your home to reduce risk of falls? Yes   ASSISTIVE DEVICES UTILIZED TO PREVENT FALLS: Life alert? No  Use of a cane, walker or w/c? No   TIMED UP AND GO: Was the test performed? No .  Virtual visit.   Cognitive Function: MMSE - Mini Mental State Exam 08/12/2017 08/02/2016 08/02/2015  Orientation to time 5 5 5   Orientation to Place 5 5 5   Registration 3 3 3   Attention/ Calculation 5 5 5   Recall 3 3 3   Language- name 2 objects 2 2 2   Language- repeat 1 1 1   Language- follow 3 step command 3 3 3   Language- read & follow direction 1 1 1   Write a sentence 1 1 1   Copy design 1 1 1   Total score 30 30 30      6CIT Screen 08/17/2020 08/17/2019 08/14/2018  What Year? 0 points 0 points 0 points  What month? 0 points 0 points 0 points  What time? 0 points 0 points 0  points  Count back from 20 0 points 0 points 0 points  Months in reverse 0 points 0 points 0 points  Repeat phrase 0 points - 0 points  Total Score 0 - 0     Immunizations Immunization History  Administered Date(s) Administered  . Influenza Split 02/15/2014  . Influenza, High Dose Seasonal PF 03/05/2018, 03/16/2019  . Influenza-Unspecified 03/04/2012, 03/24/2013, 03/08/2014, 02/20/2015, 03/05/2016, 03/11/2017, 03/15/2019, 04/24/2020  . Moderna Sars-Covid-2 Vaccination 06/25/2019, 07/23/2019, 05/08/2020  . Pneumococcal Conjugate-13 07/08/2013  . Pneumococcal Polysaccharide-23 07/12/2014  . Zoster Recombinat (Shingrix) 04/13/2019, 09/10/2019    TDAP status: Due, Education has been provided regarding the importance of this vaccine. Advised may receive this vaccine at local pharmacy or Health Dept. Aware to provide a copy of the vaccination record if obtained from local pharmacy or Health Dept. Verbalized acceptance and understanding. Deferred.  Health Maintenance Health Maintenance  Topic Date Due  . TETANUS/TDAP  Never done  . MAMMOGRAM  05/30/2021  . INFLUENZA VACCINE  Completed  . DEXA SCAN  Completed  . COVID-19 Vaccine  Completed  . PNA vac Low Risk Adult  Completed  . HPV VACCINES  Aged Out   Colorectal cancer screening: No longer required.   Mammogram status: Completed 05/30/20. Repeat every year  Lung Cancer Screening: (Low Dose CT Chest recommended if Age 19-80 years, 30 pack-year currently smoking OR have quit w/in 15years.) does not qualify.   Vision Screening: Recommended annual ophthalmology exams for early detection of glaucoma and other disorders of the eye. Is the patient up to date with their annual eye exam?  Yes  Who is the provider or what is the name of the office in which the patient attends annual eye exams? [facility].  Dental Screening: Recommended annual dental exams for proper oral hygiene. Visits every 6 months.  Community Resource Referral / Chronic Care Management: CRR required this visit?  No   CCM required this visit?  No      Plan:   Keep all routine maintenance appointments.   Follow up  01/29/21 @ 10:00.  I have personally reviewed and noted the following in the patient's chart:   . Medical and social history . Use of alcohol, tobacco or illicit drugs  . Current medications and supplements . Functional ability and status . Nutritional status . Physical activity . Advanced directives . List of other physicians . Hospitalizations, surgeries, and ER visits in previous 12 months . Vitals . Screenings to include cognitive, depression, and falls . Referrals and appointments  In addition, I have reviewed and discussed with patient certain preventive protocols, quality metrics, and best practice recommendations. A written personalized care plan for preventive services as well as general preventive health recommendations were provided to patient via mail.     Varney Biles, LPN   0/[id number]/0973

## 2020-08-17 NOTE — Patient Instructions (Addendum)
Deborah Baxter , Thank you for taking time to come for your Medicare Wellness Visit. I appreciate your ongoing commitment to your health goals. Please review the following plan we discussed and let me know if I can assist you in the future.   These are the goals we discussed: Goals      Patient Stated   .  Maintain Healthy Lifestyle (pt-stated)      Stay hydrated Stay active Healthy diet       This is a list of the screening recommended for you and due dates:  Health Maintenance  Topic Date Due  . Tetanus Vaccine  Never done  . Mammogram  05/30/2021  . Flu Shot  Completed  . DEXA scan (bone density measurement)  Completed  . COVID-19 Vaccine  Completed  . Pneumonia vaccines  Completed  . HPV Vaccine  Aged Out    Immunizations Immunization History  Administered Date(s) Administered  . Influenza Split 02/15/2014  . Influenza, High Dose Seasonal PF 03/05/2018, 03/16/2019  . Influenza-Unspecified 03/04/2012, 03/24/2013, 03/08/2014, 02/20/2015, 03/05/2016, 03/11/2017, 03/15/2019, 04/24/2020  . Moderna Sars-Covid-2 Vaccination 06/25/2019, 07/23/2019, 05/08/2020  . Pneumococcal Conjugate-13 07/08/2013  . Pneumococcal Polysaccharide-23 07/12/2014  . Zoster Recombinat (Shingrix) 04/13/2019, 09/10/2019   Keep all routine maintenance appointments.   Follow up 01/29/21 @ 10:00.  Advanced directives: on file  Conditions/risks identified: none new  Follow up in one year for your annual wellness visit.   Preventive Care 85 Years and Older, Female Preventive care refers to lifestyle choices and visits with your health care provider that can promote health and wellness. What does preventive care include?  A yearly physical exam. This is also called an annual well check.  Dental exams once or twice a year.  Routine eye exams. Ask your health care provider how often you should have your eyes checked.  Personal lifestyle choices, including:  Daily care of your teeth and  gums.  Regular physical activity.  Eating a healthy diet.  Avoiding tobacco and drug use.  Limiting alcohol use.  Practicing safe sex.  Taking low-dose aspirin every day.  Taking vitamin and mineral supplements as recommended by your health care provider. What happens during an annual well check? The services and screenings done by your health care provider during your annual well check will depend on your age, overall health, lifestyle risk factors, and family history of disease. Counseling  Your health care provider may ask you questions about your:  Alcohol use.  Tobacco use.  Drug use.  Emotional well-being.  Home and relationship well-being.  Sexual activity.  Eating habits.  History of falls.  Memory and ability to understand (cognition).  Work and work Statistician.  Reproductive health. Screening  You may have the following tests or measurements:  Height, weight, and BMI.  Blood pressure.  Lipid and cholesterol levels. These may be checked every 5 years, or more frequently if you are over 14 years old.  Skin check.  Lung cancer screening. You may have this screening every year starting at age 3 if you have a 30-pack-year history of smoking and currently smoke or have quit within the past 15 years.  Fecal occult blood test (FOBT) of the stool. You may have this test every year starting at age 47.  Flexible sigmoidoscopy or colonoscopy. You may have a sigmoidoscopy every 5 years or a colonoscopy every 10 years starting at age 37.  Hepatitis C blood test.  Hepatitis B blood test.  Sexually transmitted disease (STD) testing.  Diabetes screening. This is done by checking your blood sugar (glucose) after you have not eaten for a while (fasting). You may have this done every 1-3 years.  Bone density scan. This is done to screen for osteoporosis. You may have this done starting at age 65.  Mammogram. This may be done every 1-2 years. Talk to your  health care provider about how often you should have regular mammograms. Talk with your health care provider about your test results, treatment options, and if necessary, the need for more tests. Vaccines  Your health care provider may recommend certain vaccines, such as:  Influenza vaccine. This is recommended every year.  Tetanus, diphtheria, and acellular pertussis (Tdap, Td) vaccine. You may need a Td booster every 10 years.  Zoster vaccine. You may need this after age 72.  Pneumococcal 13-valent conjugate (PCV13) vaccine. One dose is recommended after age 18.  Pneumococcal polysaccharide (PPSV23) vaccine. One dose is recommended after age 76. Talk to your health care provider about which screenings and vaccines you need and how often you need them. This information is not intended to replace advice given to you by your health care provider. Make sure you discuss any questions you have with your health care provider. Document Released: 06/16/2015 Document Revised: 02/07/2016 Document Reviewed: 03/21/2015 Elsevier Interactive Patient Education  2017 [place] Prevention in the Home Falls can cause injuries. They can happen to people of all ages. There are many things you can do to make your home safe and to help prevent falls. What can I do on the outside of my home?  Regularly fix the edges of walkways and driveways and fix any cracks.  Remove anything that might make you trip as you walk through a door, such as a raised step or threshold.  Trim any bushes or trees on the path to your home.  Use bright outdoor lighting.  Clear any walking paths of anything that might make someone trip, such as rocks or tools.  Regularly check to see if handrails are loose or broken. Make sure that both sides of any steps have handrails.  Any raised decks and porches should have guardrails on the edges.  Have any leaves, snow, or ice cleared regularly.  Use sand or salt on walking  paths during winter.  Clean up any spills in your garage right away. This includes oil or grease spills. What can I do in the bathroom?  Use night lights.  Install grab bars by the toilet and in the tub and shower. Do not use towel bars as grab bars.  Use non-skid mats or decals in the tub or shower.  If you need to sit down in the shower, use a plastic, non-slip stool.  Keep the floor dry. Clean up any water that spills on the floor as soon as it happens.  Remove soap buildup in the tub or shower regularly.  Attach bath mats securely with double-sided non-slip rug tape.  Do not have throw rugs and other things on the floor that can make you trip. What can I do in the bedroom?  Use night lights.  Make sure that you have a light by your bed that is easy to reach.  Do not use any sheets or blankets that are too big for your bed. They should not hang down onto the floor.  Have a firm chair that has side arms. You can use this for support while you get dressed.  Do not have throw  rugs and other things on the floor that can make you trip. What can I do in the kitchen?  Clean up any spills right away.  Avoid walking on wet floors.  Keep items that you use a lot in easy-to-reach places.  If you need to reach something above you, use a strong step stool that has a grab bar.  Keep electrical cords out of the way.  Do not use floor polish or wax that makes floors slippery. If you must use wax, use non-skid floor wax.  Do not have throw rugs and other things on the floor that can make you trip. What can I do with my stairs?  Do not leave any items on the stairs.  Make sure that there are handrails on both sides of the stairs and use them. Fix handrails that are broken or loose. Make sure that handrails are as long as the stairways.  Check any carpeting to make sure that it is firmly attached to the stairs. Fix any carpet that is loose or worn.  Avoid having throw rugs at  the top or bottom of the stairs. If you do have throw rugs, attach them to the floor with carpet tape.  Make sure that you have a light switch at the top of the stairs and the bottom of the stairs. If you do not have them, ask someone to add them for you. What else can I do to help prevent falls?  Wear shoes that:  Do not have high heels.  Have rubber bottoms.  Are comfortable and fit you well.  Are closed at the toe. Do not wear sandals.  If you use a stepladder:  Make sure that it is fully opened. Do not climb a closed stepladder.  Make sure that both sides of the stepladder are locked into place.  Ask someone to hold it for you, if possible.  Clearly mark and make sure that you can see:  Any grab bars or handrails.  First and last steps.  Where the edge of each step is.  Use tools that help you move around (mobility aids) if they are needed. These include:  Canes.  Walkers.  Scooters.  Crutches.  Turn on the lights when you go into a dark area. Replace any light bulbs as soon as they burn out.  Set up your furniture so you have a clear path. Avoid moving your furniture around.  If any of your floors are uneven, fix them.  If there are any pets around you, be aware of where they are.  Review your medicines with your doctor. Some medicines can make you feel dizzy. This can increase your chance of falling. Ask your doctor what other things that you can do to help prevent falls. This information is not intended to replace advice given to you by your health care provider. Make sure you discuss any questions you have with your health care provider. Document Released: 03/16/2009 Document Revised: 10/26/2015 Document Reviewed: 06/24/2014 Elsevier Interactive Patient Education  2017 [employer].

## 2020-09-05 DIAGNOSIS — M9903 Segmental and somatic dysfunction of lumbar region: Secondary | ICD-10-CM | POA: Diagnosis not present

## 2020-09-05 DIAGNOSIS — M5136 Other intervertebral disc degeneration, lumbar region: Secondary | ICD-10-CM | POA: Diagnosis not present

## 2020-09-05 DIAGNOSIS — M9905 Segmental and somatic dysfunction of pelvic region: Secondary | ICD-10-CM | POA: Diagnosis not present

## 2020-09-05 DIAGNOSIS — M955 Acquired deformity of pelvis: Secondary | ICD-10-CM | POA: Diagnosis not present

## 2020-10-03 DIAGNOSIS — M9905 Segmental and somatic dysfunction of pelvic region: Secondary | ICD-10-CM | POA: Diagnosis not present

## 2020-10-03 DIAGNOSIS — M955 Acquired deformity of pelvis: Secondary | ICD-10-CM | POA: Diagnosis not present

## 2020-10-03 DIAGNOSIS — M5136 Other intervertebral disc degeneration, lumbar region: Secondary | ICD-10-CM | POA: Diagnosis not present

## 2020-10-03 DIAGNOSIS — M9903 Segmental and somatic dysfunction of lumbar region: Secondary | ICD-10-CM | POA: Diagnosis not present

## 2020-10-31 DIAGNOSIS — M9903 Segmental and somatic dysfunction of lumbar region: Secondary | ICD-10-CM | POA: Diagnosis not present

## 2020-10-31 DIAGNOSIS — M5136 Other intervertebral disc degeneration, lumbar region: Secondary | ICD-10-CM | POA: Diagnosis not present

## 2020-10-31 DIAGNOSIS — M9905 Segmental and somatic dysfunction of pelvic region: Secondary | ICD-10-CM | POA: Diagnosis not present

## 2020-10-31 DIAGNOSIS — M955 Acquired deformity of pelvis: Secondary | ICD-10-CM | POA: Diagnosis not present

## 2020-11-21 DIAGNOSIS — R42 Dizziness and giddiness: Secondary | ICD-10-CM | POA: Diagnosis not present

## 2020-11-21 DIAGNOSIS — R9431 Abnormal electrocardiogram [ECG] [EKG]: Secondary | ICD-10-CM | POA: Diagnosis not present

## 2020-11-21 DIAGNOSIS — R55 Syncope and collapse: Secondary | ICD-10-CM | POA: Diagnosis not present

## 2020-11-21 DIAGNOSIS — R0902 Hypoxemia: Secondary | ICD-10-CM | POA: Diagnosis not present

## 2020-11-23 ENCOUNTER — Telehealth: Payer: Self-pay | Admitting: Internal Medicine

## 2020-11-23 NOTE — Telephone Encounter (Signed)
Patient scheduled for 11/30/20. Information has been requested within epic and is now in care everywhere

## 2020-11-23 NOTE — Telephone Encounter (Signed)
Need to know where she was seen and how she is doing now.  Any residual problems?  Did she hit her head or get hurt when passed out?  Need records to review.  Need more information to better determine when needs to be seen.

## 2020-11-23 NOTE — Telephone Encounter (Signed)
Did not injure head when passing out. Was seen at [facility] in [place]. Patient states she is not having any residual issues since. No symptoms.   Patient states she can bring her discharge paperwork to the office either today or tomorrow.

## 2020-11-23 NOTE — Telephone Encounter (Signed)
Please advise, how soon should Patient be seen for ED follow up?

## 2020-11-23 NOTE — Telephone Encounter (Signed)
If she is doing ok, can schedule an appt 11/30/20 at 8;30 (open spot).  I still need the records from her visit - please request for the ER where she was seen.  Ok for her to bring papers by.

## 2020-11-23 NOTE — Telephone Encounter (Signed)
PT called to advise that [place]. The ER had advise her to advise her PCP of this.

## 2020-11-28 DIAGNOSIS — M955 Acquired deformity of pelvis: Secondary | ICD-10-CM | POA: Diagnosis not present

## 2020-11-28 DIAGNOSIS — M9903 Segmental and somatic dysfunction of lumbar region: Secondary | ICD-10-CM | POA: Diagnosis not present

## 2020-11-28 DIAGNOSIS — M5136 Other intervertebral disc degeneration, lumbar region: Secondary | ICD-10-CM | POA: Diagnosis not present

## 2020-11-28 DIAGNOSIS — M9905 Segmental and somatic dysfunction of pelvic region: Secondary | ICD-10-CM | POA: Diagnosis not present

## 2020-11-30 ENCOUNTER — Encounter: Payer: Self-pay | Admitting: Internal Medicine

## 2020-11-30 ENCOUNTER — Telehealth: Payer: Self-pay | Admitting: Internal Medicine

## 2020-11-30 ENCOUNTER — Ambulatory Visit (INDEPENDENT_AMBULATORY_CARE_PROVIDER_SITE_OTHER): Payer: Medicare Other | Admitting: Internal Medicine

## 2020-11-30 ENCOUNTER — Other Ambulatory Visit: Payer: Self-pay

## 2020-11-30 DIAGNOSIS — E7800 Pure hypercholesterolemia, unspecified: Secondary | ICD-10-CM | POA: Diagnosis not present

## 2020-11-30 DIAGNOSIS — R55 Syncope and collapse: Secondary | ICD-10-CM | POA: Diagnosis not present

## 2020-11-30 DIAGNOSIS — R030 Elevated blood-pressure reading, without diagnosis of hypertension: Secondary | ICD-10-CM | POA: Diagnosis not present

## 2020-11-30 DIAGNOSIS — M4807 Spinal stenosis, lumbosacral region: Secondary | ICD-10-CM

## 2020-11-30 NOTE — Telephone Encounter (Signed)
PT called to provide additional info in regards to the paperwork that she completed today. PT states the PO Box for one daughter is [address] and the [address] for Sharyn Lull is [phone].

## 2020-11-30 NOTE — Progress Notes (Signed)
Patient ID: Deborah Baxter, female   DOB: Sep 24, 1933, 87 y.o.   MRN: [id number]   Subjective:    Patient ID: Deborah Baxter, female    DOB: July 04, 1933, 87 y.o.   MRN: [id number]  HPI This visit occurred during the SARS-CoV-2 public health emergency.  Safety protocols were in place, including screening questions prior to the visit, additional usage of staff PPE, and extensive cleaning of exam room while observing appropriate contact time as indicated for disinfecting solutions.   Patient here for ER follow up.  Was seen in ER 11/21/20 for syncope.  [place]  States she did not eat or drink very much that day.  Felt fine when she went to bed.  The following morning she woke up feeling a little sick on her stomach.  Went to the bathroom.  No diarrhea.  No vomiting.  Had no abdominal pain.  Went out on the balcony - had been standing for a while.  She then passed out.  Denies hitting her head.  Actually no injury.  EMS was called.  Was given IV fluids on route to the hospital.  She was noted to be orthostatic.  Felt better upon arrival to the emergency room.  Per report work-up revealing.  Since her discharge she has felt back to normal.  No chest pain or tightness.  Breathing stable.  No nausea or vomiting.  She is eating and drinking well.  No headache lightheadedness or dizziness.  Overall she feels back to her baseline and has been since the episode.     Past Medical History:  Diagnosis Date   Hx of degenerative disc disease    Hypercholesterolemia    Neuromuscular disorder ([place])    pinched nerve   Osteoarthritis    Seasonal allergies    Spinal stenosis    chronic back and leg pain   Past Surgical History:  Procedure Laterality Date   APPENDECTOMY     bladder tack surgery  2000   BTL  1976   CARPAL TUNNEL RELEASE     CATARACT EXTRACTION W/PHACO Left 12/07/2015   Procedure: CATARACT EXTRACTION PHACO AND INTRAOCULAR LENS PLACEMENT ([place]);  Surgeon: Eulogio Bear, MD;  Location: [facility];  Service: Ophthalmology;  Laterality: Left;  [place] 1.23 AP% 23.2 CDE 19.28 Fluid Pack Lot # [id number]   EYE SURGERY  03/19/15   GANGLION CYST EXCISION     right hand   GAS/FLUID EXCHANGE Left 03/08/2015   Procedure: GAS/FLUID EXCHANGE;  Surgeon: Milus Height, MD;  Location: [facility] OR[facility];  Service: Ophthalmology;  Laterality: Left;   TONSILLECTOMY     TUBAL LIGATION     VITRECTOMY 25 GAUGE WITH SCLERAL BUCKLE Left 03/08/2015   Procedure: VITRECTOMY 25 GAUGE, membrane peel, 26 %  SF6 gas exchange;  Surgeon: Milus Height, MD;  Location: [facility] OR[facility];  Service: Ophthalmology;  Laterality: Left;  casette lot # [id number]   Family History  Problem Relation Age of Onset   Heart disease Father    Leukemia Father    Diabetes Mother    Heart disease Mother        myocardial infarction   Breast cancer Sister        in her 15's   Cancer Sister        lung cancer   Breast cancer Maternal Aunt    Thyroid cancer Sister    Colon cancer Neg Hx    Social History   Socioeconomic History  Marital status: Widowed    Spouse name: Not on file   Number of children: 5   Years of education: Not on file   Highest education level: Not on file  Occupational History   Not on file  Tobacco Use   Smoking status: Former    Pack years: 0.00   Smokeless tobacco: Never  Vaping Use   Vaping Use: Never used  Substance and Sexual Activity   Alcohol use: Yes    Alcohol/week: 0.0 standard drinks    Comment: occassional   Drug use: No   Sexual activity: Never  Other Topics Concern   Not on file  Social History Narrative   Not on file   Social Determinants of Health   Financial Resource Strain: Low Risk    Difficulty of Paying Living Expenses: Not hard at all  Food Insecurity: No Food Insecurity   Worried About Charity fundraiser in the Last Year: Never true   [place] in the Last Year: Never true  Transportation Needs: No Transportation Needs   Lack of  Transportation (Medical): No   Lack of Transportation (Non-Medical): No  Physical Activity: Insufficiently Active   Days of Exercise per Week: 3 days   Minutes of Exercise per Session: 20 min  Stress: No Stress Concern Present   Feeling of Stress : Not at all  Social Connections: Unknown   Frequency of Communication with Friends and Family: More than three times a week   Frequency of Social Gatherings with Friends and Family: More than three times a week   Attends Religious Services: Not on Electrical engineer or Organizations: Not on file   Attends Archivist Meetings: Not on file   Marital Status: Not on file    Review of Systems  Constitutional:  Negative for appetite change and unexpected weight change.  HENT:  Negative for congestion and sinus pressure.   Respiratory:  Negative for cough, chest tightness and shortness of breath.   Cardiovascular:  Negative for chest pain, palpitations and leg swelling.  Gastrointestinal:  Negative for abdominal pain, diarrhea, nausea and vomiting.  Genitourinary:  Negative for difficulty urinating and dysuria.  Musculoskeletal:  Negative for joint swelling and myalgias.  Skin:  Negative for color change and rash.  Neurological:  Negative for dizziness, light-headedness and headaches.  Psychiatric/Behavioral:  Negative for agitation and dysphoric mood.       Objective:    Physical Exam Vitals reviewed.  Constitutional:      General: She is not in acute distress.    Appearance: Normal appearance.  HENT:     Head: Normocephalic and atraumatic.     Right Ear: External ear normal.     Left Ear: External ear normal.  Eyes:     General: No scleral icterus.       Right eye: No discharge.        Left eye: No discharge.     Conjunctiva/sclera: Conjunctivae normal.  Neck:     Thyroid: No thyromegaly.  Cardiovascular:     Rate and Rhythm: Normal rate and regular rhythm.  Pulmonary:     Effort: No respiratory distress.      Breath sounds: Normal breath sounds. No wheezing.  Abdominal:     General: Bowel sounds are normal.     Palpations: Abdomen is soft.     Tenderness: There is no abdominal tenderness.  Musculoskeletal:        General: No swelling or  tenderness.     Cervical back: Neck supple. No tenderness.  Lymphadenopathy:     Cervical: No cervical adenopathy.  Skin:    Findings: No erythema or rash.  Neurological:     Mental Status: She is alert.  Psychiatric:        Mood and Affect: Mood normal.        Behavior: Behavior normal.    BP (!) 142/78   Pulse 90   Temp 97.8 F (36.6 C)   Ht 5' 7.01" (1.702 m)   Wt 162 lb 9.6 oz (73.8 kg)   LMP 06/26/1985   SpO2 95%   BMI 25.46 kg/m  Wt Readings from Last 3 Encounters:  11/30/20 162 lb 9.6 oz (73.8 kg)  08/17/20 165 lb (74.8 kg)  07/31/20 165 lb (74.8 kg)    Outpatient Encounter Medications as of 11/30/2020  Medication Sig   acetaminophen (TYLENOL) 650 MG CR tablet Take 650 mg by mouth every 8 (eight) hours as needed for pain. Reported on 08/02/2015   Calcium Carbonate-Vitamin D (CALCIUM 500 + D PO) Take 1 tablet by mouth 2 (two) times daily.    ibuprofen (ADVIL,MOTRIN) 200 MG tablet Take 400 mg by mouth every 6 (six) hours as needed for mild pain. Reported on 08/02/2015   Multiple Vitamin (MULTIVITAMIN) tablet Take 1 tablet by mouth daily.   OVER THE COUNTER MEDICATION Place 1 drop into both eyes every 8 (eight) hours as needed (dry eyes.). Lubricant eye drops   Polyethylene Glycol 3350 (MIRALAX PO) Take 17 g by mouth every 3 (three) days. Every 3 days   No facility-administered encounter medications on file as of 11/30/2020.     Lab Results  Component Value Date   WBC 6.6 07/31/2020   HGB 13.6 07/31/2020   HCT 40.9 07/31/2020   PLT 237.0 07/31/2020   GLUCOSE 86 07/31/2020   CHOL 224 (H) 07/31/2020   TRIG 99.0 07/31/2020   HDL 85.10 07/31/2020   LDLDIRECT 104.0 08/12/2017   LDLCALC 119 (H) 07/31/2020   ALT 16 07/31/2020   AST 19  07/31/2020   NA 137 07/31/2020   K 4.6 07/31/2020   CL 100 07/31/2020   CREATININE 0.62 07/31/2020   BUN 14 07/31/2020   CO2 31 07/31/2020   TSH 1.74 07/31/2020    MM 3D SCREEN BREAST BILATERAL  Result Date: 05/30/2020 CLINICAL DATA:  Screening. EXAM: DIGITAL SCREENING BILATERAL MAMMOGRAM WITH TOMO AND CAD COMPARISON:  Previous exam(s). ACR Breast Density Category b: There are scattered areas of fibroglandular density. FINDINGS: There are no findings suspicious for malignancy. Images were processed with CAD. IMPRESSION: No mammographic evidence of malignancy. A result letter of this screening mammogram will be mailed directly to the patient. RECOMMENDATION: Screening mammogram in one year. (Code:SM-B-01Y) BI-RADS CATEGORY  1: Negative. Electronically Signed   By: Abelardo Diesel M.D.   On: 05/30/2020 15:12       Assessment & Plan:   Problem List Items Addressed This Visit     Elevated blood pressure reading    Recheck blood pressure improved.  Have her spot check her pressure.  Send in readings.  Follow pressure.  If persistent elevation, will require medication.         Hypercholesterolemia    Low cholesterol diet and exercise.  Have discussed calculated cholesterol risk and recommendation to start cholesterol medication.  She declines.  Follow lipid panel.         Spinal stenosis    Has known spinal stenosis.  Has  seen Dr Sharlet Salina.  S/p injections.  Stable. Desires no further intervention.  Follow.        Syncope    Syncopal episode as outlined.  Did not eat or drink much the day before.  W/up in ER per report unrevealing.  Has not had any recurring episodes.  Stay hydrated.  Discussed further w/up and evaluation.  Wants to monitor.  Follow.  Notify me or be reevaluated if recurring symptoms or problems.           Einar Pheasant, MD

## 2020-12-05 ENCOUNTER — Encounter: Payer: Self-pay | Admitting: Internal Medicine

## 2020-12-05 DIAGNOSIS — R55 Syncope and collapse: Secondary | ICD-10-CM

## 2020-12-05 DIAGNOSIS — R030 Elevated blood-pressure reading, without diagnosis of hypertension: Secondary | ICD-10-CM | POA: Insufficient documentation

## 2020-12-05 HISTORY — DX: Elevated blood-pressure reading, without diagnosis of hypertension: R03.0

## 2020-12-05 HISTORY — DX: Syncope and collapse: R55

## 2020-12-05 NOTE — Assessment & Plan Note (Signed)
Syncopal episode as outlined.  Did not eat or drink much the day before.  W/up in ER per report unrevealing.  Has not had any recurring episodes.  Stay hydrated.  Discussed further w/up and evaluation.  Wants to monitor.  Follow.  Notify me or be reevaluated if recurring symptoms or problems.

## 2020-12-05 NOTE — Assessment & Plan Note (Signed)
Recheck blood pressure improved.  Have her spot check her pressure.  Send in readings.  Follow pressure.  If persistent elevation, will require medication.

## 2020-12-05 NOTE — Telephone Encounter (Signed)
Called patient to let her know that she did not sign the DPR that she completed at her last visit. Placed up front for patient to come by and sign next time she is in town.

## 2020-12-05 NOTE — Assessment & Plan Note (Signed)
Low cholesterol diet and exercise.  Have discussed calculated cholesterol risk and recommendation to start cholesterol medication.  She declines.  Follow lipid panel.   

## 2020-12-05 NOTE — Assessment & Plan Note (Signed)
Has known spinal stenosis.  Has seen Dr Sharlet Salina.  S/p injections.  Stable. Desires no further intervention.  Follow.

## 2020-12-26 DIAGNOSIS — M955 Acquired deformity of pelvis: Secondary | ICD-10-CM | POA: Diagnosis not present

## 2020-12-26 DIAGNOSIS — M9903 Segmental and somatic dysfunction of lumbar region: Secondary | ICD-10-CM | POA: Diagnosis not present

## 2020-12-26 DIAGNOSIS — M5136 Other intervertebral disc degeneration, lumbar region: Secondary | ICD-10-CM | POA: Diagnosis not present

## 2020-12-26 DIAGNOSIS — M9905 Segmental and somatic dysfunction of pelvic region: Secondary | ICD-10-CM | POA: Diagnosis not present

## 2021-01-23 DIAGNOSIS — M9903 Segmental and somatic dysfunction of lumbar region: Secondary | ICD-10-CM | POA: Diagnosis not present

## 2021-01-23 DIAGNOSIS — M9905 Segmental and somatic dysfunction of pelvic region: Secondary | ICD-10-CM | POA: Diagnosis not present

## 2021-01-23 DIAGNOSIS — M955 Acquired deformity of pelvis: Secondary | ICD-10-CM | POA: Diagnosis not present

## 2021-01-23 DIAGNOSIS — M5136 Other intervertebral disc degeneration, lumbar region: Secondary | ICD-10-CM | POA: Diagnosis not present

## 2021-01-29 ENCOUNTER — Ambulatory Visit (INDEPENDENT_AMBULATORY_CARE_PROVIDER_SITE_OTHER): Payer: Medicare Other

## 2021-01-29 ENCOUNTER — Ambulatory Visit (INDEPENDENT_AMBULATORY_CARE_PROVIDER_SITE_OTHER): Payer: Medicare Other | Admitting: Internal Medicine

## 2021-01-29 ENCOUNTER — Other Ambulatory Visit: Payer: Self-pay

## 2021-01-29 VITALS — BP 134/78 | HR 94 | Temp 97.9°F | Resp 16 | Ht 67.0 in | Wt 160.0 lb

## 2021-01-29 DIAGNOSIS — M5441 Lumbago with sciatica, right side: Secondary | ICD-10-CM | POA: Diagnosis not present

## 2021-01-29 DIAGNOSIS — M545 Low back pain, unspecified: Secondary | ICD-10-CM | POA: Insufficient documentation

## 2021-01-29 DIAGNOSIS — M4807 Spinal stenosis, lumbosacral region: Secondary | ICD-10-CM | POA: Diagnosis not present

## 2021-01-29 DIAGNOSIS — F439 Reaction to severe stress, unspecified: Secondary | ICD-10-CM | POA: Diagnosis not present

## 2021-01-29 DIAGNOSIS — E7800 Pure hypercholesterolemia, unspecified: Secondary | ICD-10-CM

## 2021-01-29 HISTORY — DX: Low back pain, unspecified: M54.50

## 2021-01-29 LAB — CBC WITH DIFFERENTIAL/PLATELET
Basophils Absolute: 0.1 10*3/uL (ref 0.0–0.1)
Basophils Relative: 1 % (ref 0.0–3.0)
Eosinophils Absolute: 0.4 10*3/uL (ref 0.0–0.7)
Eosinophils Relative: 6.2 % — ABNORMAL HIGH (ref 0.0–5.0)
HCT: 39.4 % (ref 36.0–46.0)
Hemoglobin: 12.8 g/dL (ref 12.0–15.0)
Lymphocytes Relative: 30.8 % (ref 12.0–46.0)
Lymphs Abs: 1.8 10*3/uL (ref 0.7–4.0)
MCHC: 32.5 g/dL (ref 30.0–36.0)
MCV: 93.4 fl (ref 78.0–100.0)
Monocytes Absolute: 0.5 10*3/uL (ref 0.1–1.0)
Monocytes Relative: 8.3 % (ref 3.0–12.0)
Neutro Abs: 3.2 10*3/uL (ref 1.4–7.7)
Neutrophils Relative %: 53.7 % (ref 43.0–77.0)
Platelets: 230 10*3/uL (ref 150.0–400.0)
RBC: 4.22 Mil/uL (ref 3.87–5.11)
RDW: 13.4 % (ref 11.5–15.5)
WBC: 6 10*3/uL (ref 4.0–10.5)

## 2021-01-29 LAB — COMPREHENSIVE METABOLIC PANEL
ALT: 27 U/L (ref 0–35)
AST: 23 U/L (ref 0–37)
Albumin: 3.9 g/dL (ref 3.5–5.2)
Alkaline Phosphatase: 61 U/L (ref 39–117)
BUN: 15 mg/dL (ref 6–23)
CO2: 29 mEq/L (ref 19–32)
Calcium: 9.4 mg/dL (ref 8.4–10.5)
Chloride: 103 mEq/L (ref 96–112)
Creatinine, Ser: 0.59 mg/dL (ref 0.40–1.20)
GFR: 82.41 mL/min (ref >60.00)
Glucose, Bld: 82 mg/dL (ref 70–99)
Potassium: 4.8 mEq/L (ref 3.5–5.1)
Sodium: 138 mEq/L (ref 135–145)
Total Bilirubin: 0.6 mg/dL (ref 0.2–1.2)
Total Protein: 6.5 g/dL (ref 6.0–8.3)

## 2021-01-29 LAB — LIPID PANEL
Cholesterol: 196 mg/dL (ref 0–200)
HDL: 67.7 mg/dL (ref >39.00)
LDL Cholesterol: 111 mg/dL — ABNORMAL HIGH (ref 0–99)
NonHDL: 127.87
Total CHOL/HDL Ratio: 3
Triglycerides: 85 mg/dL (ref 0.0–149.0)
VLDL: 17 mg/dL (ref 0.0–40.0)

## 2021-01-29 NOTE — Progress Notes (Signed)
Patient ID: Deborah Baxter, female   DOB: [birth year], 87 y.o.   MRN: [id number]   Subjective:    Patient ID: Deborah Baxter, female    DOB: [birth year], 87 y.o.   MRN: [id number]  This visit occurred during the SARS-CoV-2 public health emergency.  Safety protocols were in place, including screening questions prior to the visit, additional usage of staff PPE, and extensive cleaning of exam room while observing appropriate contact time as indicated for disinfecting solutions.   Patient here for a scheduled follow up.   Marland Kitchen   HPI Here to follow up regarding her blood pressure.  Also having persistent increased low back pain - worse on right side.  Has been an issue for years, but appears to be bothering her more recently.  Discussed f/u with Dr Sharlet Salina.  She is agreeable. Increased stress recently.  Daughter was living with her.  Has moved now.  Stress is better.  No chest pain or sob reported.  No abdominal pain or bowel change reported.     Past Medical History:  Diagnosis Date   Hx of degenerative disc disease    Hypercholesterolemia    Neuromuscular disorder ([place])    pinched nerve   Osteoarthritis    Seasonal allergies    Spinal stenosis    chronic back and leg pain   Past Surgical History:  Procedure Laterality Date   APPENDECTOMY     bladder tack surgery  2000   BTL  1976   CARPAL TUNNEL RELEASE     CATARACT EXTRACTION W/PHACO Left 12/07/2015   Procedure: CATARACT EXTRACTION PHACO AND INTRAOCULAR LENS PLACEMENT ([place]);  Surgeon: Eulogio Bear, MD;  Location: [facility] OR[facility];  Service: Ophthalmology;  Laterality: Left;  [place] 1.23 AP% 23.2 CDE 19.28 Fluid Pack Lot # [id number]   EYE SURGERY  03/19/15   GANGLION CYST EXCISION     right hand   GAS/FLUID EXCHANGE Left 03/08/2015   Procedure: GAS/FLUID EXCHANGE;  Surgeon: Milus Height, MD;  Location: [facility];  Service: Ophthalmology;  Laterality: Left;   TONSILLECTOMY     TUBAL LIGATION     VITRECTOMY 25 GAUGE WITH SCLERAL BUCKLE Left  03/08/2015   Procedure: VITRECTOMY 25 GAUGE, membrane peel, 26 %  SF6 gas exchange;  Surgeon: Milus Height, MD;  Location: [facility];  Service: Ophthalmology;  Laterality: Left;  casette lot # [id number]   Family History  Problem Relation Age of Onset   Heart disease Father    Leukemia Father    Diabetes Mother    Heart disease Mother        myocardial infarction   Breast cancer Sister        in her 27's   Cancer Sister        lung cancer   Breast cancer Maternal Aunt    Thyroid cancer Sister    Colon cancer Neg Hx    Social History   Socioeconomic History   Marital status: Widowed    Spouse name: Not on file   Number of children: 5   Years of education: Not on file   Highest education level: Not on file  Occupational History   Not on file  Tobacco Use   Smoking status: Former   Smokeless tobacco: Never  Vaping Use   Vaping Use: Never used  Substance and Sexual Activity   Alcohol use: Yes    Alcohol/week: 0.0 standard drinks    Comment: occassional   Drug use: No   Sexual activity: Never  Other Topics Concern   Not on file  Social History Narrative   Not on file   Social Determinants of Health   Financial Resource Strain: Low Risk    Difficulty of Paying Living Expenses: Not hard at all  Food Insecurity: No Food Insecurity   Worried About Charity fundraiser in the Last Year: Never true   [place] in the Last Year: Never true  Transportation Needs: No Transportation Needs   Lack of Transportation (Medical): No   Lack of Transportation (Non-Medical): No  Physical Activity: Insufficiently Active   Days of Exercise per Week: 3 days   Minutes of Exercise per Session: 20 min  Stress: No Stress Concern Present   Feeling of Stress : Not at all  Social Connections: Unknown   Frequency of Communication with Friends and Family: More than three times a week   Frequency of Social Gatherings with Friends and Family: More than three times a week   Attends  Religious Services: Not on Electrical engineer or Organizations: Not on file   Attends Archivist Meetings: Not on file   Marital Status: Not on file    Review of Systems  Constitutional:  Negative for appetite change and unexpected weight change.  HENT:  Negative for congestion and sinus pressure.   Respiratory:  Negative for cough, chest tightness and shortness of breath.   Cardiovascular:  Negative for chest pain, palpitations and leg swelling.  Gastrointestinal:  Negative for abdominal pain, diarrhea, nausea and vomiting.  Genitourinary:  Negative for difficulty urinating and dysuria.  Musculoskeletal:  Positive for back pain. Negative for joint swelling.  Skin:  Negative for color change and rash.  Neurological:  Negative for dizziness, light-headedness and headaches.  Psychiatric/Behavioral:  Negative for agitation and dysphoric mood.       Objective:     BP 134/78   Pulse 94   Temp 97.9 F (36.6 C)   Resp 16   Ht '5\' 7"'$  (1.702 m)   Wt 160 lb (72.6 kg)   LMP 06/26/1985   SpO2 99%   BMI 25.06 kg/m  Wt Readings from Last 3 Encounters:  01/29/21 160 lb (72.6 kg)  11/30/20 162 lb 9.6 oz (73.8 kg)  08/17/20 165 lb (74.8 kg)    Physical Exam Vitals reviewed.  Constitutional:      General: She is not in acute distress.    Appearance: Normal appearance.  HENT:     Head: Normocephalic and atraumatic.     Right Ear: External ear normal.     Left Ear: External ear normal.  Eyes:     General: No scleral icterus.       Right eye: No discharge.        Left eye: No discharge.     Conjunctiva/sclera: Conjunctivae normal.  Neck:     Thyroid: No thyromegaly.  Cardiovascular:     Rate and Rhythm: Normal rate and regular rhythm.  Pulmonary:     Effort: No respiratory distress.     Breath sounds: Normal breath sounds. No wheezing.  Abdominal:     General: Bowel sounds are normal.     Palpations: Abdomen is soft.     Tenderness: There is no abdominal  tenderness.  Musculoskeletal:        General: No swelling or tenderness.     Cervical back: Neck supple. No tenderness.  Lymphadenopathy:     Cervical: No cervical adenopathy.  Skin:  Findings: No erythema or rash.  Neurological:     Mental Status: She is alert.  Psychiatric:        Mood and Affect: Mood normal.        Behavior: Behavior normal.     Outpatient Encounter Medications as of 01/29/2021  Medication Sig   acetaminophen (TYLENOL) 650 MG CR tablet Take 650 mg by mouth every 8 (eight) hours as needed for pain. Reported on 08/02/2015   Calcium Carbonate-Vitamin D (CALCIUM 500 + D PO) Take 1 tablet by mouth 2 (two) times daily.    ibuprofen (ADVIL,MOTRIN) 200 MG tablet Take 400 mg by mouth every 6 (six) hours as needed for mild pain. Reported on 08/02/2015   Multiple Vitamin (MULTIVITAMIN) tablet Take 1 tablet by mouth daily.   OVER THE COUNTER MEDICATION Place 1 drop into both eyes every 8 (eight) hours as needed (dry eyes.). Lubricant eye drops   Polyethylene Glycol 3350 (MIRALAX PO) Take 17 g by mouth every 3 (three) days. Every 3 days   No facility-administered encounter medications on file as of 01/29/2021.     Lab Results  Component Value Date   WBC 6.0 01/29/2021   HGB 12.8 01/29/2021   HCT 39.4 01/29/2021   PLT 230.0 01/29/2021   GLUCOSE 82 01/29/2021   CHOL 196 01/29/2021   TRIG 85.0 01/29/2021   HDL 67.70 01/29/2021   LDLDIRECT 104.0 08/12/2017   LDLCALC 111 (H) 01/29/2021   ALT 27 01/29/2021   AST 23 01/29/2021   NA 138 01/29/2021   K 4.8 01/29/2021   CL 103 01/29/2021   CREATININE 0.59 01/29/2021   BUN 15 01/29/2021   CO2 29 01/29/2021   TSH 1.74 07/31/2020    MM 3D SCREEN BREAST BILATERAL  Result Date: 05/30/2020 CLINICAL DATA:  Screening. EXAM: DIGITAL SCREENING BILATERAL MAMMOGRAM WITH TOMO AND CAD COMPARISON:  Previous exam(s). ACR Breast Density Category b: There are scattered areas of fibroglandular density. FINDINGS: There are no findings  suspicious for malignancy. Images were processed with CAD. IMPRESSION: No mammographic evidence of malignancy. A result letter of this screening mammogram will be mailed directly to the patient. RECOMMENDATION: Screening mammogram in one year. (Code:SM-B-01Y) BI-RADS CATEGORY  1: Negative. Electronically Signed   By: Abelardo Diesel M.D.   On: 05/30/2020 15:12       Assessment & Plan:   Problem List Items Addressed This Visit     Hypercholesterolemia - Primary    Low cholesterol diet and exercise.  Have discussed calculated cholesterol risk and recommendation to start cholesterol medication.  She declines.  Follow lipid panel.        Relevant Orders   CBC with Differential/Platelet (Completed)   Lipid panel (Completed)   Comprehensive metabolic panel (Completed)   Low back pain    Increased low back pain as outlined.  Given increased pain, recheck lumbar spine xray.  Refer back to Dr Sharlet Salina.        Relevant Orders   DG Lumbar Spine 2-3 Views (Completed)   Spinal stenosis    Has known spinal stenosis.  Has seen Dr Sharlet Salina.  S/p injections.  Worsened pain recently.  Discussed referral back to Dr Sharlet Salina.  Agreeable.        Stress    Increased stress.  Better - daughter has moved out.  Overall she feels she is handling things relatively well.  Does not feel needs anything more at this time.  Follow.          Einar Pheasant,  MD

## 2021-02-01 NOTE — Progress Notes (Unsigned)
Order placed for referral to Dr Chasnis.   

## 2021-02-03 ENCOUNTER — Encounter: Payer: Self-pay | Admitting: Internal Medicine

## 2021-02-03 DIAGNOSIS — F439 Reaction to severe stress, unspecified: Secondary | ICD-10-CM

## 2021-02-03 HISTORY — DX: Reaction to severe stress, unspecified: F43.9

## 2021-02-03 NOTE — Assessment & Plan Note (Signed)
Low cholesterol diet and exercise.  Have discussed calculated cholesterol risk and recommendation to start cholesterol medication.  She declines.  Follow lipid panel.   

## 2021-02-03 NOTE — Assessment & Plan Note (Signed)
Increased low back pain as outlined.  Given increased pain, recheck lumbar spine xray.  Refer back to Dr Sharlet Salina.

## 2021-02-03 NOTE — Assessment & Plan Note (Signed)
Increased stress.  Better - daughter has moved out.  Overall she feels she is handling things relatively well.  Does not feel needs anything more at this time.  Follow.

## 2021-02-03 NOTE — Assessment & Plan Note (Signed)
Has known spinal stenosis.  Has seen Dr Sharlet Salina.  S/p injections.  Worsened pain recently.  Discussed referral back to Dr Sharlet Salina.  Agreeable.

## 2021-02-20 DIAGNOSIS — M9905 Segmental and somatic dysfunction of pelvic region: Secondary | ICD-10-CM | POA: Diagnosis not present

## 2021-02-20 DIAGNOSIS — M5136 Other intervertebral disc degeneration, lumbar region: Secondary | ICD-10-CM | POA: Diagnosis not present

## 2021-02-20 DIAGNOSIS — M955 Acquired deformity of pelvis: Secondary | ICD-10-CM | POA: Diagnosis not present

## 2021-02-20 DIAGNOSIS — M9903 Segmental and somatic dysfunction of lumbar region: Secondary | ICD-10-CM | POA: Diagnosis not present

## 2021-03-06 DIAGNOSIS — M5136 Other intervertebral disc degeneration, lumbar region: Secondary | ICD-10-CM | POA: Diagnosis not present

## 2021-03-06 DIAGNOSIS — M9905 Segmental and somatic dysfunction of pelvic region: Secondary | ICD-10-CM | POA: Diagnosis not present

## 2021-03-06 DIAGNOSIS — M9903 Segmental and somatic dysfunction of lumbar region: Secondary | ICD-10-CM | POA: Diagnosis not present

## 2021-03-06 DIAGNOSIS — M955 Acquired deformity of pelvis: Secondary | ICD-10-CM | POA: Diagnosis not present

## 2021-03-13 DIAGNOSIS — M5136 Other intervertebral disc degeneration, lumbar region: Secondary | ICD-10-CM | POA: Diagnosis not present

## 2021-03-13 DIAGNOSIS — M9903 Segmental and somatic dysfunction of lumbar region: Secondary | ICD-10-CM | POA: Diagnosis not present

## 2021-03-13 DIAGNOSIS — M9905 Segmental and somatic dysfunction of pelvic region: Secondary | ICD-10-CM | POA: Diagnosis not present

## 2021-03-13 DIAGNOSIS — M955 Acquired deformity of pelvis: Secondary | ICD-10-CM | POA: Diagnosis not present

## 2021-03-19 ENCOUNTER — Telehealth: Payer: Self-pay | Admitting: Internal Medicine

## 2021-03-19 NOTE — Telephone Encounter (Signed)
Patient would like Dr.Scott to know she is going to [facility] for a hand injury.

## 2021-03-19 NOTE — Telephone Encounter (Signed)
Noted. Will hold for records

## 2021-03-20 ENCOUNTER — Emergency Department: Payer: Medicare Other

## 2021-03-20 ENCOUNTER — Encounter: Payer: Self-pay | Admitting: Internal Medicine

## 2021-03-20 ENCOUNTER — Other Ambulatory Visit: Payer: Self-pay

## 2021-03-20 ENCOUNTER — Observation Stay (HOSPITAL_COMMUNITY)
Admit: 2021-03-20 | Discharge: 2021-03-20 | Disposition: A | Discharge status: Home / Self Care | Payer: Medicare Other | Attending: Internal Medicine | Admitting: Internal Medicine

## 2021-03-20 ENCOUNTER — Observation Stay: Payer: Medicare Other

## 2021-03-20 ENCOUNTER — Inpatient Hospital Stay
Admission: EM | Admit: 2021-03-20 | Discharge: 2021-03-22 | DRG: 308 | Disposition: A | Discharge status: Home Health | Payer: Medicare Other | Source: Ambulatory Visit | Attending: Internal Medicine | Admitting: Internal Medicine

## 2021-03-20 DIAGNOSIS — I160 Hypertensive urgency: Secondary | ICD-10-CM | POA: Diagnosis present

## 2021-03-20 DIAGNOSIS — I4891 Unspecified atrial fibrillation: Secondary | ICD-10-CM

## 2021-03-20 DIAGNOSIS — Z803 Family history of malignant neoplasm of breast: Secondary | ICD-10-CM

## 2021-03-20 DIAGNOSIS — E7800 Pure hypercholesterolemia, unspecified: Secondary | ICD-10-CM | POA: Diagnosis not present

## 2021-03-20 DIAGNOSIS — J90 Pleural effusion, not elsewhere classified: Secondary | ICD-10-CM | POA: Diagnosis not present

## 2021-03-20 DIAGNOSIS — S66912A Strain of unspecified muscle, fascia and tendon at wrist and hand level, left hand, initial encounter: Secondary | ICD-10-CM | POA: Diagnosis present

## 2021-03-20 DIAGNOSIS — M549 Dorsalgia, unspecified: Secondary | ICD-10-CM | POA: Diagnosis present

## 2021-03-20 DIAGNOSIS — I3139 Other pericardial effusion (noninflammatory): Secondary | ICD-10-CM | POA: Diagnosis not present

## 2021-03-20 DIAGNOSIS — Z806 Family history of leukemia: Secondary | ICD-10-CM

## 2021-03-20 DIAGNOSIS — J811 Chronic pulmonary edema: Secondary | ICD-10-CM

## 2021-03-20 DIAGNOSIS — M48061 Spinal stenosis, lumbar region without neurogenic claudication: Secondary | ICD-10-CM | POA: Diagnosis present

## 2021-03-20 DIAGNOSIS — I5031 Acute diastolic (congestive) heart failure: Secondary | ICD-10-CM | POA: Diagnosis not present

## 2021-03-20 DIAGNOSIS — I110 Hypertensive heart disease with heart failure: Secondary | ICD-10-CM | POA: Diagnosis present

## 2021-03-20 DIAGNOSIS — R0602 Shortness of breath: Secondary | ICD-10-CM | POA: Diagnosis not present

## 2021-03-20 DIAGNOSIS — Z79899 Other long term (current) drug therapy: Secondary | ICD-10-CM

## 2021-03-20 DIAGNOSIS — M7989 Other specified soft tissue disorders: Secondary | ICD-10-CM | POA: Diagnosis not present

## 2021-03-20 DIAGNOSIS — M1990 Unspecified osteoarthritis, unspecified site: Secondary | ICD-10-CM | POA: Diagnosis present

## 2021-03-20 DIAGNOSIS — Z801 Family history of malignant neoplasm of trachea, bronchus and lung: Secondary | ICD-10-CM

## 2021-03-20 DIAGNOSIS — Z8249 Family history of ischemic heart disease and other diseases of the circulatory system: Secondary | ICD-10-CM

## 2021-03-20 DIAGNOSIS — I509 Heart failure, unspecified: Secondary | ICD-10-CM

## 2021-03-20 DIAGNOSIS — M79642 Pain in left hand: Secondary | ICD-10-CM

## 2021-03-20 DIAGNOSIS — I4819 Other persistent atrial fibrillation: Secondary | ICD-10-CM | POA: Diagnosis not present

## 2021-03-20 DIAGNOSIS — E875 Hyperkalemia: Secondary | ICD-10-CM | POA: Diagnosis present

## 2021-03-20 DIAGNOSIS — X503XXA Overexertion from repetitive movements, initial encounter: Secondary | ICD-10-CM

## 2021-03-20 DIAGNOSIS — Z03818 Encounter for observation for suspected exposure to other biological agents ruled out: Secondary | ICD-10-CM | POA: Diagnosis not present

## 2021-03-20 DIAGNOSIS — Z808 Family history of malignant neoplasm of other organs or systems: Secondary | ICD-10-CM

## 2021-03-20 DIAGNOSIS — Z833 Family history of diabetes mellitus: Secondary | ICD-10-CM

## 2021-03-20 DIAGNOSIS — G8929 Other chronic pain: Secondary | ICD-10-CM | POA: Diagnosis present

## 2021-03-20 DIAGNOSIS — I252 Old myocardial infarction: Secondary | ICD-10-CM

## 2021-03-20 HISTORY — DX: Unspecified atrial fibrillation: I48.91

## 2021-03-20 HISTORY — DX: Hypertensive urgency: I16.0

## 2021-03-20 HISTORY — DX: Chronic pulmonary edema: J81.1

## 2021-03-20 HISTORY — DX: Pain in left hand: M79.642

## 2021-03-20 LAB — HEPATIC FUNCTION PANEL
ALT: 40 U/L (ref 0–44)
AST: 39 U/L (ref 15–41)
Albumin: 3.7 g/dL (ref 3.5–5.0)
Alkaline Phosphatase: 69 U/L (ref 38–126)
Bilirubin, Direct: 0.3 mg/dL — ABNORMAL HIGH (ref 0.0–0.2)
Indirect Bilirubin: 0.7 mg/dL (ref 0.3–0.9)
Total Bilirubin: 1 mg/dL (ref 0.3–1.2)
Total Protein: 6.7 g/dL (ref 6.5–8.1)

## 2021-03-20 LAB — APTT: aPTT: 35 seconds (ref 24–36)

## 2021-03-20 LAB — BASIC METABOLIC PANEL
Anion gap: 5 (ref 5–15)
BUN: 18 mg/dL (ref 8–23)
CO2: 30 mmol/L (ref 22–32)
Calcium: 9.6 mg/dL (ref 8.9–10.3)
Chloride: 100 mmol/L (ref 98–111)
Creatinine, Ser: 0.54 mg/dL (ref 0.44–1.00)
GFR, Estimated: >60 mL/min (ref >60)
Glucose, Bld: 110 mg/dL — ABNORMAL HIGH (ref 70–99)
Potassium: 5.1 mmol/L (ref 3.5–5.1)
Sodium: 135 mmol/L (ref 135–145)

## 2021-03-20 LAB — PROTIME-INR
INR: 1.2 (ref 0.8–1.2)
Prothrombin Time: 14.8 seconds (ref 11.4–15.2)

## 2021-03-20 LAB — T4, FREE: Free T4: 1.07 ng/dL (ref 0.61–1.12)

## 2021-03-20 LAB — CBC
HCT: 40.2 % (ref 36.0–46.0)
Hemoglobin: 13.2 g/dL (ref 12.0–15.0)
MCH: 31.7 pg (ref 26.0–34.0)
MCHC: 32.8 g/dL (ref 30.0–36.0)
MCV: 96.6 fL (ref 80.0–100.0)
Platelets: 177 10*3/uL (ref 150–400)
RBC: 4.16 MIL/uL (ref 3.87–5.11)
RDW: 13.8 % (ref 11.5–15.5)
WBC: 6 10*3/uL (ref 4.0–10.5)
nRBC: 0 % (ref 0.0–0.2)

## 2021-03-20 LAB — TROPONIN I (HIGH SENSITIVITY)
Troponin I (High Sensitivity): 20 ng/L — ABNORMAL HIGH (ref <18)
Troponin I (High Sensitivity): 17 ng/L (ref <18)

## 2021-03-20 LAB — TSH: TSH: 3.046 u[IU]/mL (ref 0.350–4.500)

## 2021-03-20 LAB — BRAIN NATRIURETIC PEPTIDE: B Natriuretic Peptide: 335.6 pg/mL — ABNORMAL HIGH (ref 0.0–100.0)

## 2021-03-20 MED ORDER — ACETAMINOPHEN 325 MG PO TABS
650.0000 mg | ORAL_TABLET | Freq: Four times a day (QID) | ORAL | Status: DC | PRN
  Filled 2021-03-20: qty 2

## 2021-03-20 MED ORDER — ADULT MULTIVITAMIN W/MINERALS CH
1.0000 | ORAL_TABLET | Freq: Every day | ORAL | Status: DC
  Administered 2021-03-21 – 2021-03-22 (×2): 1 via ORAL
  Filled 2021-03-20 (×2): qty 1

## 2021-03-20 MED ORDER — RIVAROXABAN 20 MG PO TABS
20.0000 mg | ORAL_TABLET | Freq: Every day | ORAL | Status: DC
  Administered 2021-03-20 – 2021-03-22 (×3): 20 mg via ORAL
  Filled 2021-03-20 (×4): qty 1

## 2021-03-20 MED ORDER — ACETAMINOPHEN 650 MG RE SUPP: 650.0000 mg | Freq: Four times a day (QID) | RECTAL | Status: DC | PRN

## 2021-03-20 MED ORDER — SENNOSIDES-DOCUSATE SODIUM 8.6-50 MG PO TABS: 1.0000 | ORAL_TABLET | Freq: Every evening | ORAL | Status: DC | PRN

## 2021-03-20 MED ORDER — FUROSEMIDE 10 MG/ML IJ SOLN
20.0000 mg | Freq: Once | INTRAMUSCULAR | Status: AC
  Administered 2021-03-20: 20 mg via INTRAVENOUS
  Filled 2021-03-20: qty 4

## 2021-03-20 MED ORDER — DILTIAZEM HCL-DEXTROSE 125-5 MG/125ML-% IV SOLN (PREMIX)
5.0000 mg/h | INTRAVENOUS | Status: DC
Start: 2021-03-20 — End: 2021-03-20
  Administered 2021-03-20: 5 mg/h via INTRAVENOUS
  Filled 2021-03-20: qty 125

## 2021-03-20 MED ORDER — OYSTER SHELL CALCIUM/D3 500-5 MG-MCG PO TABS
1.0000 | ORAL_TABLET | Freq: Two times a day (BID) | ORAL | Status: DC
  Administered 2021-03-20 – 2021-03-22 (×4): 1 via ORAL
  Filled 2021-03-20 (×4): qty 1

## 2021-03-20 MED ORDER — METOPROLOL TARTRATE 25 MG PO TABS
25.0000 mg | ORAL_TABLET | Freq: Two times a day (BID) | ORAL | Status: DC
  Administered 2021-03-20 (×2): 25 mg via ORAL
  Filled 2021-03-20 (×2): qty 1

## 2021-03-20 MED ORDER — HYDROCODONE-ACETAMINOPHEN 5-325 MG PO TABS: 1.0000 | ORAL_TABLET | ORAL | Status: DC | PRN

## 2021-03-20 MED ORDER — METOPROLOL TARTRATE 5 MG/5ML IV SOLN: 5.0000 mg | Freq: Four times a day (QID) | INTRAVENOUS | Status: DC | PRN

## 2021-03-20 MED ORDER — BISACODYL 5 MG PO TBEC: 5.0000 mg | DELAYED_RELEASE_TABLET | Freq: Every day | ORAL | Status: DC | PRN

## 2021-03-20 MED ORDER — MORPHINE SULFATE (PF) 2 MG/ML IV SOLN: 2.0000 mg | INTRAVENOUS | Status: DC | PRN

## 2021-03-20 MED ORDER — ONDANSETRON HCL 4 MG/2ML IJ SOLN: 4.0000 mg | Freq: Four times a day (QID) | INTRAMUSCULAR | Status: DC | PRN

## 2021-03-20 MED ORDER — ASPIRIN EC 81 MG PO TBEC: 81.0000 mg | DELAYED_RELEASE_TABLET | Freq: Every day | ORAL | Status: DC

## 2021-03-20 MED ORDER — ONDANSETRON HCL 4 MG PO TABS: 4.0000 mg | ORAL_TABLET | Freq: Four times a day (QID) | ORAL | Status: DC | PRN

## 2021-03-20 MED ORDER — ASPIRIN 81 MG PO CHEW
324.0000 mg | CHEWABLE_TABLET | Freq: Once | ORAL | Status: AC
  Administered 2021-03-20: 324 mg via ORAL
  Filled 2021-03-20: qty 4

## 2021-03-20 NOTE — Telephone Encounter (Signed)
Noted.  Thanks for update.  W/up in progress.

## 2021-03-20 NOTE — ED Triage Notes (Signed)
Pt went to urgent care this am for hand pain without any injury, pt states that she told the urgent care staff that she was also having some sob, and was found to be in afib and doesn't have a hx of that
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
## 2021-03-20 NOTE — H&P (Signed)
History and Physical    Deborah Baxter IHK:[id number] DOB: [birth year] DOA: 03/20/2021  PCP: Einar Pheasant, MD  Patient coming from: Home initially, but sent by primary care office  I have personally briefly reviewed patient's old medical records in [facility].  Chief Complaint: fatigue, shortness of breath  HPI: Deborah Baxter is a 87 y.o. female with medical history significant for arthritis with chronic back pain, hypertension noted in June 2022 and treated with diet, mild hyperlipidemia treated with diet, who presents to the emergency department on 03/20/2021 with fatigue and shortness of breath.  She originally presented to an urgent care office where an EKG revealed new onset atrial fibrillation with rapid ventricular response. Onset of patient's shortness of breath was 2 weeks prior to admission and duration is intermittent. She started noticing shortness of breath with walking to the mailbox and started needing to stop on her way because of shortness of breath. Shortness of breath occurs only with walking; it has been gradually worsening. Symptoms are alleviated by nothing and exacerbated by walking.  Associated symptoms: Her legs are swollen but she had not noticed it before. No weight change. No cough, wheezing, palpitations. She has has intermittent substernal chest tightness, without radiation, 3/10 tightness, without associated nausea or diaphoresis; she is not sure if the tightness is associated with shortness of breath and has not noticed if it is more with exertion; at time of admission she has no chest tightness. She has no history of CAD, CHF, atrial fibrillation, or other cardiac condition.  She does not take aspirin at home.  She initially went to urgent care for pain in her left hand that began 10 days before admission, is intermittent, is located in the middle of her hand on palmar aspect near thenar eminence, does not radiate, is up to a 10/10 at times and characterized as  sharp, is exacerbated by putting pressure on it or moving her hand and alleviated by not moving her hand. Worse with trying to tie shoes or get dressed. She recently started using a cane for her chronic back problems and initially was using it in her left hand but now has had to switch to her right hand. She started taking Advil recently for her wrist.  ED Course: EKG showed new onset atrial fibrillation with RVR.  Chest x-ray shows small bilateral pleural effusions and bilateral pulmonary edema.  Initial troponin 20.  Patient was tried on medication but continued to have rapid rate, so she was started on an IV diltiazem drip.  Review of Systems: As per HPI otherwise all other systems reviewed and are unremarable. NEUROLOGICAL: No headache or focal weakness.    Past Medical History:  Diagnosis Date   Hx of degenerative disc disease    Hypercholesterolemia    Neuromuscular disorder ([place])    pinched nerve   Osteoarthritis    Seasonal allergies    Spinal stenosis    chronic back and leg pain    Past Surgical History:  Procedure Laterality Date   APPENDECTOMY     bladder tack surgery  2000   BTL  1976   CARPAL TUNNEL RELEASE     CATARACT EXTRACTION W/PHACO Left 12/07/2015   Procedure: CATARACT EXTRACTION PHACO AND INTRAOCULAR LENS PLACEMENT ([place]);  Surgeon: Eulogio Bear, MD;  Location: [facility] ORS;  Service: Ophthalmology;  Laterality: Left;  [place] 1.23 AP% 23.2 CDE 19.28 Fluid Pack Lot # [id number]   EYE SURGERY  03/19/15   GANGLION CYST EXCISION  right hand   GAS/FLUID EXCHANGE Left 03/08/2015   Procedure: GAS/FLUID EXCHANGE;  Surgeon: Milus Height, MD;  Location: [facility] ORS;  Service: Ophthalmology;  Laterality: Left;   TONSILLECTOMY     TUBAL LIGATION     VITRECTOMY 25 GAUGE WITH SCLERAL BUCKLE Left 03/08/2015   Procedure: VITRECTOMY 25 GAUGE, membrane peel, 26 %  SF6 gas exchange;  Surgeon: Milus Height, MD;  Location: [facility] ORS;  Service: Ophthalmology;  Laterality: Left;   casette lot # [id number]    Social History  reports that she has quit smoking. She has never used smokeless tobacco. She reports current alcohol use. She reports that she does not use drugs.  No Known Allergies  Family History  Problem Relation Age of Onset   Diabetes Mother    Heart disease Father        Unknown age of onset. Unknown if had CHF or heart attack.   Leukemia Father    Breast cancer Sister        in her 52's   Cancer Sister        lung cancer   Thyroid cancer Sister    Breast cancer Maternal Aunt    Colon cancer Neg Hx      Home Medications  Prior to Admission medications   Medication Sig Start Date End Date Taking? Authorizing Provider  acetaminophen (TYLENOL) 650 MG CR tablet Take 650 mg by mouth every 8 (eight) hours as needed for pain. Reported on 08/02/2015    [provider]  Calcium Carbonate-Vitamin D (CALCIUM 500 + D PO) Take 1 tablet by mouth 2 (two) times daily.     [provider]  ibuprofen (ADVIL,MOTRIN) 200 MG tablet Take 400 mg by mouth every 6 (six) hours as needed for mild pain. Reported on 08/02/2015    [provider]  Multiple Vitamin (MULTIVITAMIN) tablet Take 1 tablet by mouth daily.    [provider]  OVER THE COUNTER MEDICATION Place 1 drop into both eyes every 8 (eight) hours as needed (dry eyes.). Lubricant eye drops    [provider]  Polyethylene Glycol 3350 (MIRALAX PO) Take 17 g by mouth every 3 (three) days. Every 3 days    [provider]    Physical Exam: Vitals:   03/20/21 1051 03/20/21 1056 03/20/21 1130  BP:  (!) 164/113 (!) 192/112  Pulse:  (!) 134 94  Resp:  18 15  Temp:  97.7 F (36.5 C)   TempSrc:  Oral   SpO2:  95% 98%  Weight: 74.8 kg    Height: 5\' 7"  (1.702 m)      Constitutional: NAD, calm, comfortable, ill-appearing. Vitals:   03/20/21 1051 03/20/21 1056 03/20/21 1130  BP:  (!) 164/113 (!) 192/112  Pulse:  (!) 134 94  Resp:  18 15  Temp:  97.7 F (36.5  C)   TempSrc:  Oral   SpO2:  95% 98%  Weight: 74.8 kg    Height: 5\' 7"  (1.702 m)     Eyes: Pupils equal and round, lids and conjunctivae without icterus or erythema. ENMT: Mucous membranes are moist. Posterior pharynx clear of any exudate or lesions. Nares patent without discharge or bleeding.  Normocephalic, atraumatic.  Normal dentition.  Neck: normal, supple, no masses, trachea midline.  Thyroid nontender, no masses appreciated, no thyromegaly. Respiratory: clear to auscultation bilaterally. Chest wall movements are symmetric. No wheezing.  No rhonchi. Rales in left base. Normal respiratory effort. No accessory muscle use.  Cardiovascular: Normal S1,  S2. Irregular. Mildly tachycardic. Edema 1+ bilaterally. No murmurs / rubs / gallops. Pulses: DP pulses 2+ bilaterally. No carotid bruits.  Capillary refill less than 3 seconds. Edema: None bilaterally. GI: soft, non-distended, normal active bowel sounds. No hepatosplenomegaly. No rigidity, rebound, or guarding. Non-tender. No masses palpated.  Musculoskeletal: no clubbing / cyanosis. No joint deformity upper and lower extremities. Good ROM, no contractures. Normal muscle tone.  No tenderness or deformity in the back bilaterally. Tenderness in the palm of left hand near thenar eminence; no tenderness in wrist or joints of hand. Integument: no rashes, lesions, ulcers. No induration. Clean, dry, intact. Neurologic: CN 2-12 grossly intact. Sensation grossly intact to light touch. DTR 2+ bilaterally.  Babinski: Toes downgoing bilaterally.  Strength 5/5 in all 4.  Intact rapid alternating movements bilaterally.  No pronator drift. Psychiatric: Normal judgment and insight. Alert and oriented x 3. Normal mood.  Normal and appropriate affect. Lymphatic: No cervical lymphadenopathy. No supraclavicular lymphadenopathy.   Labs on Admission: I have personally reviewed the following labs and imaging studies.  CBC: Recent Labs  Lab 03/20/21 1058  WBC 6.0   HGB 13.2  HCT 40.2  MCV 96.6  PLT 528    Basic Metabolic Panel: Recent Labs  Lab 03/20/21 1058  NA 135  K 5.1  CL 100  CO2 30  GLUCOSE 110*  BUN 18  CREATININE 0.54  CALCIUM 9.6    GFR: Estimated Creatinine Clearance: 54.3 mL/min (by C-G formula based on SCr of 0.54 mg/dL).  Liver Function Tests: No results for input(s): AST, ALT, ALKPHOS, BILITOT, PROT, ALBUMIN in the last 168 hours.   Radiological Exams on Admission: DG Chest 2 View  Result Date: 03/20/2021 CLINICAL DATA:  sob EXAM: CHEST - 2 VIEW COMPARISON:  None. FINDINGS: The cardiomediastinal silhouette is enlarged in contour. Small LEFT-greater-than-RIGHT pleural effusion. No pneumothorax. Peribronchial cuffing and interstitial prominence. Bibasilar homogeneous opacities, likely passive atelectasis. Visualized abdomen is unremarkable. No acute osseous abnormality noted. IMPRESSION: 1. Small bilateral pleural effusions. 2. Constellation of findings are favored to reflect pulmonary edema with bibasilar atelectasis. Infection could present similarly. Electronically Signed   By: Valentino Saxon M.D.   On: 03/20/2021 13:04    EKG: Independently reviewed.  121 bpm.  Atrial fibrillation with rapid ventricular response.  Flat T wave in leads I and III.   Assessment/Plan  Principal Problem:    Atrial fibrillation with rapid ventricular response ([place]) New onset atrial fibrillation with RVR.  No history of any cardiac disorders other than hypertension. CHA2DS2-VASc score is 4.   Plan: Start oral beta-blocker, metoprolol.  Initially will continue IV diltiazem drip started in the emergency department.  IV metoprolol as needed for additional heart rate control.  Start anticoagulation with Xarelto, but if cardiologist prefers another agent or prefers to initiate with Lovenox or heparin, will defer to cardiologist's recommendations.  Telemetry.  Check troponin x2; any further troponin checks will be at the discretion of the  cardiologist.  Ordered echocardiogram.  Cardiology consult.   Active Problems:    Hypertensive urgency Hypertension diagnosed several months ago and had been diet controlled but is no longer diet controlled. Plan: Start treatment with metoprolol given new A. fib with RVR.  Consider adding lisinopril and possibly furosemide if it is determined that patient has CHF.  Add aspirin 81 mg daily.     Pulmonary edema Chest x-ray shows pulmonary edema and small bilateral pleural effusions.  BNP is elevated.  Strongly suspect patient has new onset congestive heart failure (  CHF). Plan: Metoprolol ordered.  2 g sodium diet.  Echocardiogram ordered.  If patient does have new onset CHF, she will need teaching regarding 2 g sodium diet and need to weigh self daily.  If CHF is confirmed, then consider adding lisinopril and furosemide.  Cardiologist is consulted.    Lumbar spinal stenosis Plan: As needed Tylenol and outpatient follow-up.  Patient cautioned not to use over-the-counter NSAIDs while taking anticoagulant medication.    Left hand pain May be muscle strain from recent use of her cane.  Plan: Tylenol.  Outpatient follow-up with primary care.     DVT prophylaxis: Xarelto (starting due to new A fib).  Code Status:   Full Code Family Communication:    Elizabeth Palau [phone], daughter, at bedside.  Disposition Plan:   Patient is from:  Home  Anticipated DC to:  Home  Anticipated DC date:  03/21/21  Anticipated DC barriers: rapid rate of A fib and new diagnosis of A fib  Consults called:  Cardiologist, Dr. Saunders Revel  Admission status:  Observation  Severity of Illness: The appropriate patient status for this patient is OBSERVATION. Observation status is judged to be reasonable and necessary in order to provide the required intensity of service to ensure the patient's safety. The patient's presenting symptoms, physical exam findings, and initial radiographic and laboratory data in the context  of their medical condition is felt to place them at decreased risk for further clinical deterioration.  Findings include new onset atrial fibrillation with RVR requiring IV diltiazem drip.  No pulmonary edema and pleural effusions with risk for new onset CHF.  Furthermore, it is anticipated that the patient will be medically stable for discharge from the hospital within 2 midnights of admission.     Tacey Ruiz MD [facility]  How to contact the [facility] Attending or Consulting provider [place] or covering provider during after hours [place], for this patient?   Check the care team in [facility] and look for a) attending/consulting [facility] provider listed and b) the [facility] team listed Log into [email or web address] and use [place]'s universal password to access. If you do not have the password, please contact the hospital operator. Locate the [facility] provider you are looking for under [facility] and page to a number that you can be directly reached. If you still have difficulty reaching the provider, please page the [facility] (Director on Call) for the Hospitalists listed on amion for assistance.  03/20/2021, 12:38 PM

## 2021-03-20 NOTE — ED Provider Notes (Signed)
[facility] Emergency Department Provider Note   ____________________________________________   Event Date/Time   First MD Initiated Contact with Patient 03/20/21 1124     (approximate)  I have reviewed the triage vital signs and the nursing notes.   HISTORY  Chief Complaint Hand Pain, Shortness of Breath, and Atrial Fibrillation    HPI Deborah Baxter is a 87 y.o. female who presents from clinic for shortness of breath, hand pain, and atrial fibrillation found incidentally  LOCATION: Chest DURATION: 3 days prior to arrival TIMING: Stable since onset SEVERITY: Mild QUALITY: Shortness of breath CONTEXT: Patient states that she presented to clinic this morning for hand pain and related that she has been having worsening shortness of breath especially on exertion and after an EKG was performed that she was found to be in A. fib with rapid ventricular response MODIFYING FACTORS: Exertion worsens her shortness of breath and is partially relieved at rest ASSOCIATED SYMPTOMS: Denies chest pain and endorses mild palpitations   Per medical record review, patient has history of of syncope          Past Medical History:  Diagnosis Date   Hx of degenerative disc disease    Hypercholesterolemia    Neuromuscular disorder ([place])    pinched nerve   Osteoarthritis    Seasonal allergies    Spinal stenosis    chronic back and leg pain    Patient Active Problem List   Diagnosis Date Noted   Atrial fibrillation with rapid ventricular response ([place]) 03/20/2021   Hypertensive urgency 03/20/2021   Stress 02/03/2021   Low back pain 01/29/2021   Syncope 12/05/2020   Elevated blood pressure reading 12/05/2020   Numbness and tingling in left hand 08/21/2017   Lumbago 07/13/2014   Health care maintenance 07/13/2014   Diverticulosis 09/05/2012   Hypercholesterolemia 06/27/2012   Spinal stenosis 06/27/2012    Past Surgical History:  Procedure Laterality Date    APPENDECTOMY     bladder tack surgery  2000   BTL  1976   CARPAL TUNNEL RELEASE     CATARACT EXTRACTION W/PHACO Left 12/07/2015   Procedure: CATARACT EXTRACTION PHACO AND INTRAOCULAR LENS PLACEMENT ([facility]);  Surgeon: Eulogio Bear, MD;  Location: [facility] ORS;  Service: Ophthalmology;  Laterality: Left;  [place] 1.23 AP% 23.2 CDE 19.28 Fluid Pack Lot # [id number]   EYE SURGERY  03/19/15   GANGLION CYST EXCISION     right hand   GAS/FLUID EXCHANGE Left 03/08/2015   Procedure: GAS/FLUID EXCHANGE;  Surgeon: Milus Height, MD;  Location: [facility] ORS;  Service: Ophthalmology;  Laterality: Left;   TONSILLECTOMY     TUBAL LIGATION     VITRECTOMY 25 GAUGE WITH SCLERAL BUCKLE Left 03/08/2015   Procedure: VITRECTOMY 25 GAUGE, membrane peel, 26 %  SF6 gas exchange;  Surgeon: Milus Height, MD;  Location: [facility] ORS;  Service: Ophthalmology;  Laterality: Left;  casette lot # [id number]    Prior to Admission medications   Medication Sig Start Date End Date Taking? Authorizing Provider  acetaminophen (TYLENOL) 650 MG CR tablet Take 650 mg by mouth every 8 (eight) hours as needed for pain. Reported on 08/02/2015   Yes [provider]  Calcium Carbonate-Vitamin D (CALCIUM 500 + D PO) Take 1 tablet by mouth 2 (two) times daily.    Yes [provider]  Multiple Vitamin (MULTIVITAMIN) tablet Take 1 tablet by mouth daily.   Yes [provider]  Polyethylene Glycol 3350 (MIRALAX PO) Take 17 g by  mouth every 3 (three) days. Every 3 days   Yes [provider]  ibuprofen (ADVIL,MOTRIN) 200 MG tablet Take 400 mg by mouth every 6 (six) hours as needed for mild pain. Reported on 08/02/2015    [provider]  OVER THE COUNTER MEDICATION Place 1 drop into both eyes every 8 (eight) hours as needed (dry eyes.). Lubricant eye drops    [provider]    Allergies Patient has no known allergies.  Family History  Problem Relation Age of Onset   Heart disease Father     Leukemia Father    Diabetes Mother    Heart disease Mother        myocardial infarction   Breast cancer Sister        in her 12's   Cancer Sister        lung cancer   Breast cancer Maternal Aunt    Thyroid cancer Sister    Colon cancer Neg Hx     Social History Social History   Tobacco Use   Smoking status: Former   Smokeless tobacco: Never  Scientific laboratory technician Use: Never used  Substance Use Topics   Alcohol use: Yes    Alcohol/week: 0.0 standard drinks    Comment: occassional   Drug use: No    Review of Systems Constitutional: No fever/chills Eyes: No visual changes. ENT: No sore throat. Cardiovascular: Denies chest pain.  Endorses mild palpitations Respiratory: Endorses shortness of breath. Gastrointestinal: No abdominal pain.  No nausea, no vomiting.  No diarrhea. Genitourinary: Negative for dysuria. Musculoskeletal: Positive for acute left hand pain Skin: Negative for rash. Neurological: Negative for headaches, weakness/numbness/paresthesias in any extremity Psychiatric: Negative for suicidal ideation/homicidal ideation   ____________________________________________   PHYSICAL EXAM:  VITAL SIGNS: ED Triage Vitals  Enc Vitals Group     BP 03/20/21 1056 (!) 164/113     Pulse Rate 03/20/21 1056 (!) 134     Resp 03/20/21 1056 18     Temp 03/20/21 1056 97.7 F (36.5 C)     Temp Source 03/20/21 1056 Oral     SpO2 03/20/21 1056 95 %     Weight 03/20/21 1051 165 lb (74.8 kg)     Height 03/20/21 1051 5\' 7"  (1.702 m)     Head Circumference --      Peak Flow --      Pain Score 03/20/21 1052 3     Pain Loc --      Pain Edu? --      Excl. in [place]? --    Constitutional: Alert and oriented. Well appearing and in no acute distress. Eyes: Conjunctivae are normal. PERRL. Head: Atraumatic. Nose: No congestion/rhinnorhea. Mouth/Throat: Mucous membranes are moist. Neck: No stridor Cardiovascular: Tachycardic irregularly irregular rhythm.  Grossly normal heart  sounds.  Good peripheral circulation. Respiratory: Normal respiratory effort.  No retractions. Gastrointestinal: Soft and nontender. No distention. Musculoskeletal: No obvious deformities Neurologic:  Normal speech and language. No gross focal neurologic deficits are appreciated. Skin:  Skin is warm and dry. No rash noted. Psychiatric: Mood and affect are normal. Speech and behavior are normal.  ____________________________________________   LABS (all labs ordered are listed, but only abnormal results are displayed)  Labs Reviewed  BASIC METABOLIC PANEL - Abnormal; Notable for the following components:      Result Value   Glucose, Bld 110 (*)    All other components within normal limits  TROPONIN I (HIGH SENSITIVITY) - Abnormal; Notable for the following  components:   Troponin I (High Sensitivity) 20 (*)    All other components within normal limits  CBC  HEPATIC FUNCTION PANEL  TSH  T4, FREE  BRAIN NATRIURETIC PEPTIDE  PROTIME-INR  APTT  TROPONIN I (HIGH SENSITIVITY)   ____________________________________________  EKG  ED ECG REPORT I, Naaman Plummer, the attending physician, personally viewed and interpreted this ECG.  Date: 03/20/2021 EKG Time: 1055 Rate: 121 Rhythm: Atrial fibrillation with rapid ventricular response QRS Axis: normal Intervals: normal ST/T Wave abnormalities: normal Narrative Interpretation: Atrial fibrillation with rapid ventricular response.  No evidence of acute ischemia ____________________________________________  RADIOLOGY  ED MD interpretation: 2 view x-ray of the chest shows small bilateral pleural effusions and pulmonary edema  Official radiology report(s): DG Chest 2 View  Result Date: 03/20/2021 CLINICAL DATA:  sob EXAM: CHEST - 2 VIEW COMPARISON:  None. FINDINGS: The cardiomediastinal silhouette is enlarged in contour. Small LEFT-greater-than-RIGHT pleural effusion. No pneumothorax. Peribronchial cuffing and interstitial  prominence. Bibasilar homogeneous opacities, likely passive atelectasis. Visualized abdomen is unremarkable. No acute osseous abnormality noted. IMPRESSION: 1. Small bilateral pleural effusions. 2. Constellation of findings are favored to reflect pulmonary edema with bibasilar atelectasis. Infection could present similarly. Electronically Signed   By: Valentino Saxon M.D.   On: 03/20/2021 13:04   ____________________________________________   PROCEDURES  Procedure(s) performed (including Critical Care):  .1-3 Lead EKG Interpretation Performed by: Naaman Plummer, MD Authorized by: Naaman Plummer, MD     Interpretation: abnormal     ECG rate:  95   ECG rate assessment: normal     Rhythm: atrial fibrillation     Ectopy: none     Conduction: normal    CRITICAL CARE Performed by: Naaman Plummer   Total critical care time: 35 minutes  Critical care time was exclusive of separately billable procedures and treating other patients.  Critical care was necessary to treat or prevent imminent or life-threatening deterioration.  Critical care was time spent personally by me on the following activities: development of treatment plan with patient and/or surrogate as well as nursing, discussions with consultants, evaluation of patient's response to treatment, examination of patient, obtaining history from patient or surrogate, ordering and performing treatments and interventions, ordering and review of laboratory studies, ordering and review of radiographic studies, pulse oximetry and re-evaluation of patient's condition.  ____________________________________________   INITIAL IMPRESSION / ASSESSMENT AND PLAN / ED COURSE  As part of my medical decision making, I reviewed the following data within the electronic medical record, if available:  Nursing notes reviewed and incorporated, Labs reviewed, EKG interpreted, Old chart reviewed, Radiograph reviewed and Notes from prior ED visits reviewed  and incorporated        + atrial fibrillation w/ RVR DDx: Pneumothorax, Pneumonia, Pulmonary Embolus, Tamponade, ACS, Thyrotoxicosis.  No history or evidence decompensated heart failure. Given their history and exam it is likely this patient is unlikely to spontaneously revert to a rate controlled rhythm and necessitates a thorough workup for their arrhythmia. Workup: ECG, CXR, CBC, BMP, UA, Troponin, BNP, TSH, Ca-Mag-Phos Interventions: Defer Cardioversion (uncertain historical reliability with time of onset, increased risk of thromboembolic stroke).  Start diltiazem bolus and drip  Disposition: Admit      ____________________________________________   FINAL CLINICAL IMPRESSION(S) / ED DIAGNOSES  Final diagnoses:  Atrial fibrillation with rapid ventricular response [facility])     ED Discharge Orders          Ordered    Amb referral to AFIB Clinic  03/20/21 1258             Note:  This document was prepared using Dragon voice recognition software and may include unintentional dictation errors.    Naaman Plummer, MD 03/20/21 1340

## 2021-03-20 NOTE — Telephone Encounter (Signed)
FYI patient went to walk in and was sent to ED- per notes, keeping her for observation for new onset a-fib.

## 2021-03-20 NOTE — Telephone Encounter (Signed)
Noted  

## 2021-03-21 ENCOUNTER — Telehealth: Payer: Self-pay | Admitting: *Deleted

## 2021-03-21 ENCOUNTER — Other Ambulatory Visit: Payer: Self-pay | Admitting: Physician Assistant

## 2021-03-21 DIAGNOSIS — X503XXA Overexertion from repetitive movements, initial encounter: Secondary | ICD-10-CM | POA: Diagnosis not present

## 2021-03-21 DIAGNOSIS — E7800 Pure hypercholesterolemia, unspecified: Secondary | ICD-10-CM | POA: Diagnosis present

## 2021-03-21 DIAGNOSIS — I3139 Other pericardial effusion (noninflammatory): Secondary | ICD-10-CM | POA: Diagnosis present

## 2021-03-21 DIAGNOSIS — I4891 Unspecified atrial fibrillation: Secondary | ICD-10-CM | POA: Diagnosis not present

## 2021-03-21 DIAGNOSIS — I10 Essential (primary) hypertension: Secondary | ICD-10-CM | POA: Diagnosis not present

## 2021-03-21 DIAGNOSIS — E875 Hyperkalemia: Secondary | ICD-10-CM | POA: Diagnosis present

## 2021-03-21 DIAGNOSIS — Z801 Family history of malignant neoplasm of trachea, bronchus and lung: Secondary | ICD-10-CM | POA: Diagnosis not present

## 2021-03-21 DIAGNOSIS — J810 Acute pulmonary edema: Secondary | ICD-10-CM | POA: Diagnosis not present

## 2021-03-21 DIAGNOSIS — I5031 Acute diastolic (congestive) heart failure: Secondary | ICD-10-CM | POA: Diagnosis present

## 2021-03-21 DIAGNOSIS — M1990 Unspecified osteoarthritis, unspecified site: Secondary | ICD-10-CM | POA: Diagnosis present

## 2021-03-21 DIAGNOSIS — Z808 Family history of malignant neoplasm of other organs or systems: Secondary | ICD-10-CM | POA: Diagnosis not present

## 2021-03-21 DIAGNOSIS — M549 Dorsalgia, unspecified: Secondary | ICD-10-CM | POA: Diagnosis present

## 2021-03-21 DIAGNOSIS — G8929 Other chronic pain: Secondary | ICD-10-CM | POA: Diagnosis present

## 2021-03-21 DIAGNOSIS — I252 Old myocardial infarction: Secondary | ICD-10-CM | POA: Diagnosis not present

## 2021-03-21 DIAGNOSIS — I110 Hypertensive heart disease with heart failure: Secondary | ICD-10-CM | POA: Diagnosis present

## 2021-03-21 DIAGNOSIS — Z806 Family history of leukemia: Secondary | ICD-10-CM | POA: Diagnosis not present

## 2021-03-21 DIAGNOSIS — I4819 Other persistent atrial fibrillation: Secondary | ICD-10-CM | POA: Diagnosis present

## 2021-03-21 DIAGNOSIS — S66912A Strain of unspecified muscle, fascia and tendon at wrist and hand level, left hand, initial encounter: Secondary | ICD-10-CM | POA: Diagnosis present

## 2021-03-21 DIAGNOSIS — Z803 Family history of malignant neoplasm of breast: Secondary | ICD-10-CM | POA: Diagnosis not present

## 2021-03-21 DIAGNOSIS — Z833 Family history of diabetes mellitus: Secondary | ICD-10-CM | POA: Diagnosis not present

## 2021-03-21 DIAGNOSIS — M48061 Spinal stenosis, lumbar region without neurogenic claudication: Secondary | ICD-10-CM | POA: Diagnosis present

## 2021-03-21 DIAGNOSIS — Z79899 Other long term (current) drug therapy: Secondary | ICD-10-CM | POA: Diagnosis not present

## 2021-03-21 DIAGNOSIS — Z8249 Family history of ischemic heart disease and other diseases of the circulatory system: Secondary | ICD-10-CM | POA: Diagnosis not present

## 2021-03-21 DIAGNOSIS — I160 Hypertensive urgency: Secondary | ICD-10-CM | POA: Diagnosis present

## 2021-03-21 LAB — BASIC METABOLIC PANEL
Anion gap: 10 (ref 5–15)
BUN: 23 mg/dL (ref 8–23)
CO2: 26 mmol/L (ref 22–32)
Calcium: 9.7 mg/dL (ref 8.9–10.3)
Chloride: 99 mmol/L (ref 98–111)
Creatinine, Ser: 0.54 mg/dL (ref 0.44–1.00)
GFR, Estimated: >60 mL/min (ref >60)
Glucose, Bld: 118 mg/dL — ABNORMAL HIGH (ref 70–99)
Potassium: 4.6 mmol/L (ref 3.5–5.1)
Sodium: 135 mmol/L (ref 135–145)

## 2021-03-21 LAB — CBG MONITORING, ED: Glucose-Capillary: 152 mg/dL — ABNORMAL HIGH (ref 70–99)

## 2021-03-21 LAB — CBC
HCT: 39.1 % (ref 36.0–46.0)
Hemoglobin: 13 g/dL (ref 12.0–15.0)
MCH: 31.8 pg (ref 26.0–34.0)
MCHC: 33.2 g/dL (ref 30.0–36.0)
MCV: 95.6 fL (ref 80.0–100.0)
Platelets: 174 10*3/uL (ref 150–400)
RBC: 4.09 MIL/uL (ref 3.87–5.11)
RDW: 13.8 % (ref 11.5–15.5)
WBC: 6.3 10*3/uL (ref 4.0–10.5)
nRBC: 0 % (ref 0.0–0.2)

## 2021-03-21 LAB — ECHOCARDIOGRAM COMPLETE
Height: 67 in
S' Lateral: 2.62 cm
Weight: 2640 oz

## 2021-03-21 MED ORDER — METOPROLOL TARTRATE 50 MG PO TABS
50.0000 mg | ORAL_TABLET | Freq: Two times a day (BID) | ORAL | Status: DC
  Administered 2021-03-21 – 2021-03-22 (×3): 50 mg via ORAL
  Filled 2021-03-21 (×3): qty 1

## 2021-03-21 MED ORDER — FUROSEMIDE 10 MG/ML IJ SOLN: 20.0000 mg | Freq: Once | INTRAMUSCULAR | Status: DC

## 2021-03-21 MED ORDER — FUROSEMIDE 40 MG PO TABS: 20.0000 mg | ORAL_TABLET | Freq: Every day | ORAL | Status: DC

## 2021-03-21 MED ORDER — FUROSEMIDE 10 MG/ML IJ SOLN
40.0000 mg | Freq: Two times a day (BID) | INTRAMUSCULAR | Status: DC
  Administered 2021-03-21 – 2021-03-22 (×2): 40 mg via INTRAVENOUS
  Filled 2021-03-21 (×2): qty 4

## 2021-03-21 MED ORDER — FUROSEMIDE 10 MG/ML IJ SOLN
20.0000 mg | Freq: Once | INTRAMUSCULAR | Status: AC
  Administered 2021-03-21: 20 mg via INTRAVENOUS
  Filled 2021-03-21: qty 4

## 2021-03-21 MED ORDER — LOSARTAN POTASSIUM 50 MG PO TABS
25.0000 mg | ORAL_TABLET | Freq: Every day | ORAL | Status: DC
  Administered 2021-03-21: 25 mg via ORAL
  Filled 2021-03-21: qty 1

## 2021-03-21 NOTE — ED Notes (Signed)
Patient assisted out of bed and to toilet.  Steady gait noted.  New sheets applied to bed while patient was up.

## 2021-03-21 NOTE — Discharge Instructions (Signed)
     You have received care from [facility] during this hospital stay and we look forward to continuing to provide you with excellent care in our office settings after you've left the hospital.  In order to assure a smoother transition to home following your discharge from the hospital, we will likely have you see one of our nurse practitioners or physician assistants within a few weeks of discharge.  Our advanced practice providers work closely with your physician in order to address all of your heart's needs in a timely manner.  More information about all of our providers may be found here: [email or web address]   Please plan to bring all of your prescriptions to your follow-up appointment and don't hesitate to contact us with questions or concerns.  Clarence Lockwood [place], [place] [address] --------------------------------------   [employer] [place] wants to help you get well and stay well.  Live a longer, healthier life by practicing healthy habits every day.  1.  Visit your primary care provider regularly. 2.  Make time for family and friends.  Healthy relationships are important. 3.  Take medications as directed by your provider. 4.  Maintain a healthy weight and a trim waistline. 5.  Eat healthy meals and snacks, rich in fruits, vegetables, whole grains, and lean proteins. 6.  Get moving every day - aim for 150 minutes of moderate physical activity each week. 7.  Don't smoke. 8.  Avoid alcohol or drink in moderation. 9.  Manage stress through meditation or mindful relaxation. 10.  Get seven to nine hours of quality sleep each night.  Want more information on healthy habits?  To learn more about these and other healthy habits, visit [email or web address].  ---------------------- Your echo: One of your heart tests  showed weakness of the heart muscle this admission. This may make you more susceptible to weight gain from fluid retention, which can lead to symptoms that we call heart failure. Please follow these special instructions:  1. Follow a low-salt diet - you are allowed no more than 2,000mg  of sodium per day. Watch your fluid intake. In general, you should not be taking in more than 2 liters of fluid per day (no more than 8 glasses per day). This includes sources of water in foods like soup, coffee, tea, milk, etc. 2. Weigh yourself on the same scale at same time of day and keep a log. 3. Call your doctor: (Anytime you feel any of the following symptoms)  - 3lb weight gain overnight or 5lb within a few days - Shortness of breath, with or without a dry hacking cough  - Swelling in the hands, feet or stomach  - If you have to sleep on extra pillows at night in order to breathe   IT IS IMPORTANT TO LET YOUR DOCTOR KNOW EARLY ON IF YOU ARE HAVING SYMPTOMS SO WE CAN HELP YOU!

## 2021-03-21 NOTE — Progress Notes (Signed)
Orders placed for SPEP/UPEP/Light Chains for further workup to exclude infiltrative cardiomyopathy given echo findings.

## 2021-03-21 NOTE — Progress Notes (Signed)
Progress Note  Patient Name: Deborah Baxter Date of Encounter: 03/21/2021  Primary Cardiologist: [facility], Dr. Saunders Revel  Subjective   Reports improvement in breathing and swelling. Overall feels better today.  No chest pain, racing HR, or palpitations.  Echo results reviewed. Follow-up recommended with AVS to be updated with office information and message to be sent to office. Reviewed precautions associated with anticoagulation   Activated her MyChart  Inpatient Medications    Scheduled Meds:  aspirin EC  81 mg Oral Daily   calcium-vitamin D  1 tablet Oral BID   metoprolol tartrate  25 mg Oral BID   multivitamin with minerals  1 tablet Oral Daily   rivaroxaban  20 mg Oral Q supper   Continuous Infusions:  PRN Meds: acetaminophen **OR** acetaminophen, bisacodyl, HYDROcodone-acetaminophen, metoprolol tartrate, morphine injection, ondansetron **OR** ondansetron (ZOFRAN) IV, senna-docusate   Vital Signs    Vitals:   03/21/21 0140 03/21/21 0230 03/21/21 0430 03/21/21 0600  BP: (!) 165/101 (!) 158/98 (!) 145/100 (!) 148/99  Pulse: 83 85 (!) 137 86  Resp: 17 19 15 18   Temp:      TempSrc:      SpO2: 92% 93% 93% 95%  Weight:      Height:        Intake/Output Summary (Last 24 hours) at 03/21/2021 1001 Last data filed at 03/20/2021 2000 Gross per 24 hour  Intake 42.28 ml  Output --  Net 42.28 ml   Last 3 Weights 03/20/2021 01/29/2021 11/30/2020  Weight (lbs) 165 lb 160 lb 162 lb 9.6 oz  Weight (kg) 74.844 kg 72.576 kg 73.755 kg      Telemetry    Afib with rates 80-130s - Personally Reviewed  ECG    No new tracings - Personally Reviewed  Physical Exam   GEN: Elderly female, NAD, eating breakfast. Joined by her daughter. Neck: No JVD Cardiac: IRIR and tachycardic, soft heart sounds, 1/6 systolic murmur. No rubs, or gallops.  Respiratory: Clear to auscultation bilaterally with slightly reduced breath sounds at the bases. GI: Soft, nontender, non-distended  MS:  1+ bilateral edema; No deformity. Neuro:  Nonfocal  Psych: Normal affect   Labs    High Sensitivity Troponin:   Recent Labs  Lab 03/20/21 1058 03/20/21 1443  TROPONINIHS 20* 17      Chemistry Recent Labs  Lab 03/20/21 1041 03/20/21 1058 03/21/21 0559  NA  --  135 135  K  --  5.1 4.6  CL  --  100 99  CO2  --  30 26  GLUCOSE  --  110* 118*  BUN  --  18 23  CREATININE  --  0.54 0.54  CALCIUM  --  9.6 9.7  PROT 6.7  --   --   ALBUMIN 3.7  --   --   AST 39  --   --   ALT 40  --   --   ALKPHOS 69  --   --   BILITOT 1.0  --   --   GFRNONAA  --  >60 >60  ANIONGAP  --  5 10     Hematology Recent Labs  Lab 03/20/21 1058 03/21/21 0559  WBC 6.0 6.3  RBC 4.16 4.09  HGB 13.2 13.0  HCT 40.2 39.1  MCV 96.6 95.6  MCH 31.7 31.8  MCHC 32.8 33.2  RDW 13.8 13.8  PLT 177 174    BNP Recent Labs  Lab 03/20/21 1058  BNP 335.6*  DDimer No results for input(s): DDIMER in the last 168 hours.   Radiology    DG Chest 2 View  Result Date: 03/20/2021 CLINICAL DATA:  sob EXAM: CHEST - 2 VIEW COMPARISON:  None. FINDINGS: The cardiomediastinal silhouette is enlarged in contour. Small LEFT-greater-than-RIGHT pleural effusion. No pneumothorax. Peribronchial cuffing and interstitial prominence. Bibasilar homogeneous opacities, likely passive atelectasis. Visualized abdomen is unremarkable. No acute osseous abnormality noted. IMPRESSION: 1. Small bilateral pleural effusions. 2. Constellation of findings are favored to reflect pulmonary edema with bibasilar atelectasis. Infection could present similarly. Electronically Signed   By: Valentino Saxon M.D.   On: 03/20/2021 13:04   ECHOCARDIOGRAM COMPLETE  Result Date: 03/21/2021    ECHOCARDIOGRAM REPORT   Patient Name:   Deborah Baxter [facility] Date of Exam: 03/20/2021 Medical Rec #:  [id number]    Height:       67.0 in Accession #:    [id number]   Weight:       165.0 lb Date of Birth:  [birth year]    BSA:          1.863 m Patient Age:    87  years     BP:           129/81 mmHg Patient Gender: F            HR:           68 bpm. Exam Location:  [facility] Procedure: 2D Echo, Cardiac Doppler and Color Doppler Indications:     I48.91 Atrial Fibrillation  History:         Patient has no prior history of Echocardiogram examinations.                  Risk Factors:Dyslipidemia.  Sonographer:     Cresenciano Lick RDCS Referring Phys:  [id number] Tacey Ruiz Diagnosing Phys: Nelva Bush MD IMPRESSIONS  1. Left ventricular ejection fraction, by estimation, is 50 to 55%. The left ventricle has low normal function. The left ventricle has no regional wall motion abnormalities. There is moderate left ventricular hypertrophy. Left ventricular diastolic function could not be evaluated.  2. Right ventricular systolic function is mildly reduced. The right ventricular size is normal. There is normal pulmonary artery systolic pressure.  3. Left atrial size was moderately dilated.  4. Right atrial size was moderately dilated.  5. Moderate pericardial effusion. The pericardial effusion is posterior to the left ventricle. There is no evidence of cardiac tamponade.  6. The mitral valve is degenerative. Moderate mitral valve regurgitation. Moderate to severe mitral annular calcification.  7. Tricuspid valve is mildly thickened. The tricuspid valve is abnormal. Tricuspid valve regurgitation is moderate.  8. The aortic valve is tricuspid. There is mild thickening of the aortic valve. Aortic valve regurgitation is trivial. Mild aortic valve sclerosis is present, with no evidence of aortic valve stenosis.  9. There is mild dilatation of the ascending aorta, measuring 41 mm. 10. The inferior vena cava is normal in size with <50% respiratory variability, suggesting right atrial pressure of 8 mmHg. Conclusion(s)/Recommendation(s): Given moderate LVH, biatrial enlargement, and low voltage on EKG, infiltrative cardiomyopathy, including amyloidosis, should be considered. FINDINGS  Left  Ventricle: Left ventricular ejection fraction, by estimation, is 50 to 55%. The left ventricle has low normal function. The left ventricle has no regional wall motion abnormalities. The left ventricular internal cavity size was normal in size. There is moderate left ventricular hypertrophy. Left ventricular diastolic function could not be evaluated due to atrial fibrillation. Left ventricular diastolic function  could not be evaluated. Right Ventricle: The right ventricular size is normal. No increase in right ventricular wall thickness. Right ventricular systolic function is mildly reduced. There is normal pulmonary artery systolic pressure. The tricuspid regurgitant velocity is 2.55 m/s, and with an assumed right atrial pressure of 8 mmHg, the estimated right ventricular systolic pressure is 74.0 mmHg. Left Atrium: Left atrial size was moderately dilated. Right Atrium: Right atrial size was moderately dilated. Pericardium: A moderately sized pericardial effusion is present. The pericardial effusion is posterior to the left ventricle. There is no evidence of cardiac tamponade. Mitral Valve: The mitral valve is degenerative in appearance. There is moderate thickening of the mitral valve leaflet(s). Moderate to severe mitral annular calcification. Moderate mitral valve regurgitation. Tricuspid Valve: Tricuspid valve is mildly thickened. The tricuspid valve is abnormal. Tricuspid valve regurgitation is moderate. Aortic Valve: The aortic valve is tricuspid. There is mild thickening of the aortic valve. Aortic valve regurgitation is trivial. Mild aortic valve sclerosis is present, with no evidence of aortic valve stenosis. Pulmonic Valve: The pulmonic valve was not well visualized. Pulmonic valve regurgitation is mild. No evidence of pulmonic stenosis. Aorta: The aortic root is normal in size and structure. There is mild dilatation of the ascending aorta, measuring 41 mm. Venous: The inferior vena cava is normal in size  with less than 50% respiratory variability, suggesting right atrial pressure of 8 mmHg. IAS/Shunts: No atrial level shunt detected by color flow Doppler. Additional Comments: There is pleural effusion in the left lateral region.  LEFT VENTRICLE PLAX 2D LVIDd:         3.82 cm LVIDs:         2.62 cm LV PW:         1.51 cm LV IVS:        1.39 cm LVOT diam:     1.70 cm LV SV:         35 LV SV Index:   19 LVOT Area:     2.27 cm  RIGHT VENTRICLE             IVC RV Basal diam:  3.65 cm     IVC diam: 1.79 cm RV S prime:     10.80 cm/s TAPSE (M-mode): 1.6 cm LEFT ATRIUM             Index        RIGHT ATRIUM           Index LA diam:        4.50 cm 2.41 cm/m   RA Area:     22.00 cm LA Vol (A2C):   92.6 ml 49.69 ml/m  RA Volume:   71.20 ml  38.21 ml/m LA Vol (A4C):   68.9 ml 36.97 ml/m LA Biplane Vol: 84.2 ml 45.18 ml/m  AORTIC VALVE LVOT Vmax:   80.05 cm/s LVOT Vmean:  57.450 cm/s LVOT VTI:    0.152 m  AORTA Ao Root diam: 3.30 cm Ao Asc diam:  4.10 cm MV E velocity: 131.67 cm/s  TRICUSPID VALVE                             TR Peak grad:   26.0 mmHg                             TR Vmax:        255.00 cm/s  SHUNTS                             Systemic VTI:  0.15 m                             Systemic Diam: 1.70 cm Nelva Bush MD Electronically signed by Nelva Bush MD Signature Date/Time: 03/21/2021/7:20:33 AM    Final     Cardiac Studies   Echo 03/21/21  1. Left ventricular ejection fraction, by estimation, is 50 to 55%. The  left ventricle has low normal function. The left ventricle has no regional  wall motion abnormalities. There is moderate left ventricular hypertrophy.  Left ventricular diastolic  function could not be evaluated.   2. Right ventricular systolic function is mildly reduced. The right  ventricular size is normal. There is normal pulmonary artery systolic  pressure.   3. Left atrial size was moderately dilated.   4. Right atrial size was moderately  dilated.   5. Moderate pericardial effusion. The pericardial effusion is posterior  to the left ventricle. There is no evidence of cardiac tamponade.   6. The mitral valve is degenerative. Moderate mitral valve regurgitation.  Moderate to severe mitral annular calcification.   7. Tricuspid valve is mildly thickened. The tricuspid valve is abnormal.  Tricuspid valve regurgitation is moderate.   8. The aortic valve is tricuspid. There is mild thickening of the aortic  valve. Aortic valve regurgitation is trivial. Mild aortic valve sclerosis  is present, with no evidence of aortic valve stenosis.   9. There is mild dilatation of the ascending aorta, measuring 41 mm.  10. The inferior vena cava is normal in size with <50% respiratory  variability, suggesting right atrial pressure of 8 mmHg.   Conclusion(s)/Recommendation(s): Given moderate LVH, biatrial enlargement,  and low voltage on EKG, infiltrative cardiomyopathy, including  amyloidosis, should be considered.   Patient Profile     87 y.o. female  with no previously known history of heart failure or arrhythmia and with history of prior syncopal episode 11/21/2020, HLD, osteoarthritis, spinal stenosis, and allergies, previous tobacco use, and who is being seen today for the evaluation of new onset atrial fibrillation and found to have echo findings as directly above with HFmrEF 50-55% and possible infiltrative CM considered.  Assessment & Plan    New onset atrial fibrillation with RVR -Remains in atrial fibrillation of unknown chronicity.  She reports dyspnea over the last couple of weeks. No tachypalpitations.  TSH WNL.  Echo as above. Increased to lopressor 50mg  BID for further rate control. Goal ventricular rate <110.   Consolidate to Toprol before discharge Started on anticoagulation with Xarelto yesterday CHA2DS2VASc score of at least  3 (CHF, age x2, female) with recommendation for anticoagulation to reduce the risk of  thromboembolic event. Reviewed [facility] precautions.  H&H stable. If still in atrial fibrillation after 1 month of anticoagulation, could consider cardioversion at that time. No plan for TEE/DCCV given asx and rates relatively well controlled. No DCCV this admission as not therapeutic on [place].  Follow-up in the office recommended. Will send message to office and include on AVS. Will need repeat OP CBC and BMET at that time. Recommend follow-up echo after back in NSR and to monitor pericardial effusion as below.    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 Breathing and LEE improving  with IV diuresis, though still volume up on exam.  Echo obtained with EF 50-55%, moderate LVH, pericardial effusion without evidence of tamponade and findings as copied above under CV studies.  Reordered IV lasix 20mg  daily today Renal function stable.  Hyperkalemia improving. Transition to lasix 20mg  daily tomorrow Monitor I/os, daily standing weights Daily BMET.  Monitor renal function electrolytes. Started Losartan for BP support and given low nl EF CHF education. Na/fluid restrictions. Follow-up in the office and consider future workup to exclude amyloidosis / infiltrative CM given LVH, bi-atrial enlargement, low voltage EKG. Repeat echo to monitor effusion size in OP setting.  Pericardial effusion, moderate --Moderate pericardial effusion noted on echo as above. She did report recent COVID. No evidence of tamponade on echo and pt is hemodynamically stable. Consider addition of colchicine and NSAIDs with close OP follow-up/ repeat echo in the OP setting to reassess.   Essential hypertension --BP elevated this AM. Started Losartan. Continue BB, Losartan, lasix. Goal BP 130/80 or lower.   Valvular dz --As above under CV studies. Degenerative MV with moderate MR and moderate to severe annular calcification, moderate TR with thickening, mild thickening of ao  valve. Periodic echo to monitor. Reviewed with pt.  Mild dilation of ascending Ao --Mildly dilated at 67mm by echo. Avoid FQ, heavy lifting. BP, HR, LDL control.     For questions or updates, please contact [facility] Please consult [email or web address] for contact info under        Signed, Arvil Chaco, PA-C  03/21/2021, 10:01 AM

## 2021-03-21 NOTE — Telephone Encounter (Signed)
-----   Message from Arvil Chaco, PA-C sent at 03/21/2021  4:06 PM EDT ----- Regarding: Hospital follow-up Hello,  This patient was admitted and seen at [facility] for newly diagnosed atrial fibrillation and found to have a pericardial effusion and possible infiltrative cardiomyopathy.  We are expecting discharge tomorrow 03/22/2021.  Can you please call and arrange / schedule for follow-up TCM appointment with Dr. Saunders Revel or APP within the next two weeks?  She will need a CBC and BMET at that time.  Thank you! Signed, Arvil Chaco, PA-C 03/21/2021, 4:07 PM Pager [phone]

## 2021-03-21 NOTE — Evaluation (Signed)
Physical Therapy Evaluation Patient Details Name: Deborah Baxter MRN: [id number] DOB: 03-Nov-1933 Today's Date: 03/21/2021  History of Present Illness  Deborah Baxter is a 87 y.o. female with medical history significant for arthritis with chronic back pain, hypertension noted in June 2022 and treated with diet, mild hyperlipidemia treated with diet, who presents to the emergency department on 03/20/2021 with fatigue and shortness of breath.  She originally presented to an urgent care office due to left hand pain, where an EKG revealed new onset atrial fibrillation with rapid ventricular response.   Clinical Impression  Patient received on stretcher, daughter in room. She reports she is feeling ok. Agrees to PT assessment. Patient needs min guard for mobility on/off stretcher and to stand. Min assist with mobility. Patient is unsteady reaching for bed, counters in room for assistance with balance. Has cane, but really needs walker for improved safety. Patient will continue to benefit from skilled PT while here to improve safety and independence with mobility.          Recommendations for follow up therapy are one component of a multi-disciplinary discharge planning process, led by the attending physician.  Recommendations may be updated based on patient status, additional functional criteria and insurance authorization.  Follow Up Recommendations Home health PT    Equipment Recommendations  Rolling walker with 5" wheels    Recommendations for Other Services       Precautions / Restrictions Precautions Precautions: Fall Restrictions Weight Bearing Restrictions: No      Mobility  Bed Mobility Overal bed mobility: Needs Assistance Bed Mobility: Supine to Sit;Sit to Supine     Supine to sit: Min assist Sit to supine: Min assist        Transfers Overall transfer level: Needs assistance Equipment used: None Transfers: Sit to/from Stand Sit to Stand: Min assist         General  transfer comment: patient slightly unsteady with standing. Reaching for bed to steady  Ambulation/Gait Ambulation/Gait assistance: Min assist Gait Distance (Feet): 40 Feet Assistive device: Straight cane;1 person hand held assist Gait Pattern/deviations: Step-through pattern Gait velocity: decreased   General Gait Details: patient requiring at least single UE support during ambulation and min assist. used cane for part of ambulation in room. Reaching out for furniture in room to help stedy.  Stairs            Wheelchair Mobility    Modified Rankin (Stroke Patients Only)       Balance Overall balance assessment: Needs assistance Sitting-balance support: Feet supported Sitting balance-Leahy Scale: Good     Standing balance support: Bilateral upper extremity supported;During functional activity Standing balance-Leahy Scale: Fair Standing balance comment: requires assist for safety with mobility and at least single UE support.                             Pertinent Vitals/Pain Pain Assessment: Faces Faces Pain Scale: Hurts a little bit Pain Location: left hand Pain Descriptors / Indicators: Discomfort;Sore Pain Intervention(s): Monitored during session    Home Living Family/patient expects to be discharged to:: Private residence Living Arrangements: Alone           Home Layout: One level Home Equipment: [place] - single point      Prior Function Level of Independence: Independent with assistive device(s)         Comments: been using cane recently     Hand Dominance  Extremity/Trunk Assessment   Upper Extremity Assessment Upper Extremity Assessment: LUE deficits/detail LUE Deficits / Details: patient has pain in thumb area with graasping and pressure    Lower Extremity Assessment Lower Extremity Assessment: Generalized weakness    Cervical / Trunk Assessment Cervical / Trunk Assessment: Kyphotic  Communication   Communication:  No difficulties  Cognition Arousal/Alertness: Awake/alert Behavior During Therapy: WFL for tasks assessed/performed Overall Cognitive Status: Within Functional Limits for tasks assessed                                        General Comments      Exercises     Assessment/Plan    PT Assessment Patient needs continued PT services  PT Problem List Decreased mobility;Decreased activity tolerance;Decreased balance;Decreased strength;Decreased safety awareness;Decreased knowledge of use of DME       PT Treatment Interventions Therapeutic activities;Functional mobility training;Gait training;Stair training;DME instruction;Patient/family education;Balance training;Therapeutic exercise    PT Goals (Current goals can be found in the Care Plan section)  Acute Rehab PT Goals Patient Stated Goal: to return home PT Goal Formulation: With patient/family Time For Goal Achievement: 04/04/21 Potential to Achieve Goals: Good    Frequency Min 2X/week   Barriers to discharge Decreased caregiver support      Baxter-evaluation               AM-PAC PT "6 Clicks" Mobility  Outcome Measure Help needed turning from your back to your side while in a flat bed without using bedrails?: A Little Help needed moving from lying on your back to sitting on the side of a flat bed without using bedrails?: A Little Help needed moving to and from a bed to a chair (including a wheelchair)?: A Little Help needed standing up from a chair using your arms (e.g., wheelchair or bedside chair)?: A Little Help needed to walk in hospital room?: A Little Help needed climbing 3-5 steps with a railing? : A Little 6 Click Score: 18    End of Session Equipment Utilized During Treatment: Gait belt Activity Tolerance: Patient tolerated treatment well Patient left: in bed;with call bell/phone within reach;with family/visitor present Nurse Communication: Mobility status PT Visit Diagnosis: Unsteadiness on  feet (R26.81);Difficulty in walking, not elsewhere classified (R26.2)    Time: 6256-3893 PT Time Calculation (min) (ACUTE ONLY): 17 min   Charges:   PT Evaluation $PT Eval Moderate Complexity: 1 Mod PT Treatments $Gait Training: 8-22 mins        [employer], PT, GCS 03/21/21,2:08 PM

## 2021-03-21 NOTE — Progress Notes (Addendum)
PROGRESS NOTE    Deborah Baxter  ZCH:[id number] DOB: 10/24/1933 DOA: 03/20/2021 PCP: Einar Pheasant, MD    Assessment & Plan:   Principal Problem:   Atrial fibrillation with rapid ventricular response ([place]) Active Problems:   Lumbar spinal stenosis   Hypertensive urgency   Pulmonary edema   Left hand pain   A. fib: w/ RVR. New onset.CHA2DS2-VASc score is 4.  Continue on metoprolol & xarelto. Cardio following and recs apprec    HTN urgency: resolved but still w/ HTN. Continue on metoprolol, losartan   Pulmonary edema: likely secondary to acute diastolic CHF. Echo showed EF 50-55%, no regional wall motion abnormalities, moderate pericardial effusion (no evidence of cardiac tamponade) & diastolic function could not be evaluated. Continue on IV lasix  Lumbar spinal stenosis: continue on tylenol prn   Left hand pain: maybe muscle strain. Tylenol prn. F/u outpatient w/ PCP     DVT prophylaxis: xarelto  Code Status: full  Family Communication: discussed pt's care w/ pt's family at bedside and answered her questions  Disposition Plan: likely d/c back home   Level of care: Progressive Cardiac  Status is: Observation  The patient remains OBS appropriate and will d/c before 2 midnights.    Consultants:    Procedures:   Antimicrobials:    Subjective: Pt c/o fatigue   Objective: Vitals:   03/21/21 0140 03/21/21 0230 03/21/21 0430 03/21/21 0600  BP: (!) 165/101 (!) 158/98 (!) 145/100 (!) 148/99  Pulse: 83 85 (!) 137 86  Resp: 17 19 15 18   Temp:      TempSrc:      SpO2: 92% 93% 93% 95%  Weight:      Height:        Intake/Output Summary (Last 24 hours) at 03/21/2021 0910 Last data filed at 03/20/2021 2000 Gross per 24 hour  Intake 42.28 ml  Output --  Net 42.28 ml   Filed Weights   03/20/21 1051  Weight: 74.8 kg    Examination:  General exam: Appears calm and comfortable  Respiratory system: diminished breath sounds b/l  Cardiovascular system:  irregularly irregular.  No  rubs, gallops or clicks.  Gastrointestinal system: Abdomen is nondistended, soft and nontender. Normal bowel sounds heard. Central nervous system: Alert and oriented. Moves all extremities  Psychiatry: Judgement and insight appear normal. Flat mood and affect     Data Reviewed: I have personally reviewed following labs and imaging studies  CBC: Recent Labs  Lab 03/20/21 1058 03/21/21 0559  WBC 6.0 6.3  HGB 13.2 13.0  HCT 40.2 39.1  MCV 96.6 95.6  PLT 177 287   Basic Metabolic Panel: Recent Labs  Lab 03/20/21 1058 03/21/21 0559  NA 135 135  K 5.1 4.6  CL 100 99  CO2 30 26  GLUCOSE 110* 118*  BUN 18 23  CREATININE 0.54 0.54  CALCIUM 9.6 9.7   GFR: Estimated Creatinine Clearance: 54.3 mL/min (by C-G formula based on SCr of 0.54 mg/dL). Liver Function Tests: Recent Labs  Lab 03/20/21 1041  AST 39  ALT 40  ALKPHOS 69  BILITOT 1.0  PROT 6.7  ALBUMIN 3.7   No results for input(s): LIPASE, AMYLASE in the last 168 hours. No results for input(s): AMMONIA in the last 168 hours. Coagulation Profile: Recent Labs  Lab 03/20/21 1041  INR 1.2   Cardiac Enzymes: No results for input(s): CKTOTAL, CKMB, CKMBINDEX, TROPONINI in the last 168 hours. BNP (last 3 results) No results for input(s): PROBNP in the last 8760  hours. HbA1C: No results for input(s): HGBA1C in the last 72 hours. CBG: No results for input(s): GLUCAP in the last 168 hours. Lipid Profile: No results for input(s): CHOL, HDL, LDLCALC, TRIG, CHOLHDL, LDLDIRECT in the last 72 hours. Thyroid Function Tests: Recent Labs    03/20/21 1041  TSH 3.046  FREET4 1.07   Anemia Panel: No results for input(s): VITAMINB12, FOLATE, FERRITIN, TIBC, IRON, RETICCTPCT in the last 72 hours. Sepsis Labs: No results for input(s): PROCALCITON, LATICACIDVEN in the last 168 hours.  No results found for this or any previous visit (from the past 240 hour(s)).       Radiology Studies: DG  Chest 2 View  Result Date: 03/20/2021 CLINICAL DATA:  sob EXAM: CHEST - 2 VIEW COMPARISON:  None. FINDINGS: The cardiomediastinal silhouette is enlarged in contour. Small LEFT-greater-than-RIGHT pleural effusion. No pneumothorax. Peribronchial cuffing and interstitial prominence. Bibasilar homogeneous opacities, likely passive atelectasis. Visualized abdomen is unremarkable. No acute osseous abnormality noted. IMPRESSION: 1. Small bilateral pleural effusions. 2. Constellation of findings are favored to reflect pulmonary edema with bibasilar atelectasis. Infection could present similarly. Electronically Signed   By: Valentino Saxon M.D.   On: 03/20/2021 13:04   ECHOCARDIOGRAM COMPLETE  Result Date: 03/21/2021    ECHOCARDIOGRAM REPORT   Patient Name:   Deborah Baxter [facility] Date of Exam: 03/20/2021 Medical Rec #:  [id number]    Height:       67.0 in Accession #:    [id number]   Weight:       165.0 lb Date of Birth:  [birth year]    BSA:          1.863 m Patient Age:    87 years     BP:           129/81 mmHg Patient Gender: F            HR:           68 bpm. Exam Location:  [facility] Procedure: 2D Echo, Cardiac Doppler and Color Doppler Indications:     I48.91 Atrial Fibrillation  History:         Patient has no prior history of Echocardiogram examinations.                  Risk Factors:Dyslipidemia.  Sonographer:     Cresenciano Lick RDCS Referring Phys:  [id number] Tacey Ruiz Diagnosing Phys: Nelva Bush MD IMPRESSIONS  1. Left ventricular ejection fraction, by estimation, is 50 to 55%. The left ventricle has low normal function. The left ventricle has no regional wall motion abnormalities. There is moderate left ventricular hypertrophy. Left ventricular diastolic function could not be evaluated.  2. Right ventricular systolic function is mildly reduced. The right ventricular size is normal. There is normal pulmonary artery systolic pressure.  3. Left atrial size was moderately dilated.  4. Right atrial size was  moderately dilated.  5. Moderate pericardial effusion. The pericardial effusion is posterior to the left ventricle. There is no evidence of cardiac tamponade.  6. The mitral valve is degenerative. Moderate mitral valve regurgitation. Moderate to severe mitral annular calcification.  7. Tricuspid valve is mildly thickened. The tricuspid valve is abnormal. Tricuspid valve regurgitation is moderate.  8. The aortic valve is tricuspid. There is mild thickening of the aortic valve. Aortic valve regurgitation is trivial. Mild aortic valve sclerosis is present, with no evidence of aortic valve stenosis.  9. There is mild dilatation of the ascending aorta, measuring 41 mm. 10. The inferior vena cava is  normal in size with <50% respiratory variability, suggesting right atrial pressure of 8 mmHg. Conclusion(s)/Recommendation(s): Given moderate LVH, biatrial enlargement, and low voltage on EKG, infiltrative cardiomyopathy, including amyloidosis, should be considered. FINDINGS  Left Ventricle: Left ventricular ejection fraction, by estimation, is 50 to 55%. The left ventricle has low normal function. The left ventricle has no regional wall motion abnormalities. The left ventricular internal cavity size was normal in size. There is moderate left ventricular hypertrophy. Left ventricular diastolic function could not be evaluated due to atrial fibrillation. Left ventricular diastolic function could not be evaluated. Right Ventricle: The right ventricular size is normal. No increase in right ventricular wall thickness. Right ventricular systolic function is mildly reduced. There is normal pulmonary artery systolic pressure. The tricuspid regurgitant velocity is 2.55 m/s, and with an assumed right atrial pressure of 8 mmHg, the estimated right ventricular systolic pressure is 27.0 mmHg. Left Atrium: Left atrial size was moderately dilated. Right Atrium: Right atrial size was moderately dilated. Pericardium: A moderately sized  pericardial effusion is present. The pericardial effusion is posterior to the left ventricle. There is no evidence of cardiac tamponade. Mitral Valve: The mitral valve is degenerative in appearance. There is moderate thickening of the mitral valve leaflet(s). Moderate to severe mitral annular calcification. Moderate mitral valve regurgitation. Tricuspid Valve: Tricuspid valve is mildly thickened. The tricuspid valve is abnormal. Tricuspid valve regurgitation is moderate. Aortic Valve: The aortic valve is tricuspid. There is mild thickening of the aortic valve. Aortic valve regurgitation is trivial. Mild aortic valve sclerosis is present, with no evidence of aortic valve stenosis. Pulmonic Valve: The pulmonic valve was not well visualized. Pulmonic valve regurgitation is mild. No evidence of pulmonic stenosis. Aorta: The aortic root is normal in size and structure. There is mild dilatation of the ascending aorta, measuring 41 mm. Venous: The inferior vena cava is normal in size with less than 50% respiratory variability, suggesting right atrial pressure of 8 mmHg. IAS/Shunts: No atrial level shunt detected by color flow Doppler. Additional Comments: There is pleural effusion in the left lateral region.  LEFT VENTRICLE PLAX 2D LVIDd:         3.82 cm LVIDs:         2.62 cm LV PW:         1.51 cm LV IVS:        1.39 cm LVOT diam:     1.70 cm LV SV:         35 LV SV Index:   19 LVOT Area:     2.27 cm  RIGHT VENTRICLE             IVC RV Basal diam:  3.65 cm     IVC diam: 1.79 cm RV S prime:     10.80 cm/s TAPSE (M-mode): 1.6 cm LEFT ATRIUM             Index        RIGHT ATRIUM           Index LA diam:        4.50 cm 2.41 cm/m   RA Area:     22.00 cm LA Vol (A2C):   92.6 ml 49.69 ml/m  RA Volume:   71.20 ml  38.21 ml/m LA Vol (A4C):   68.9 ml 36.97 ml/m LA Biplane Vol: 84.2 ml 45.18 ml/m  AORTIC VALVE LVOT Vmax:   80.05 cm/s LVOT Vmean:  57.450 cm/s LVOT VTI:    0.152 m  AORTA Ao Root diam:  3.30 cm Ao Asc diam:   4.10 cm MV E velocity: 131.67 cm/s  TRICUSPID VALVE                             TR Peak grad:   26.0 mmHg                             TR Vmax:        255.00 cm/s                              SHUNTS                             Systemic VTI:  0.15 m                             Systemic Diam: 1.70 cm Nelva Bush MD Electronically signed by Nelva Bush MD Signature Date/Time: 03/21/2021/7:20:33 AM    Final         Scheduled Meds:  aspirin EC  81 mg Oral Daily   calcium-vitamin D  1 tablet Oral BID   metoprolol tartrate  25 mg Oral BID   multivitamin with minerals  1 tablet Oral Daily   rivaroxaban  20 mg Oral Q supper   Continuous Infusions:   LOS: 0 days    Time spent: 32 mins     Wyvonnia Dusky, MD [facility] Pager [phone]  If 7PM-7AM, please contact night-coverage 03/21/2021, 9:10 AM

## 2021-03-22 DIAGNOSIS — I3139 Other pericardial effusion (noninflammatory): Secondary | ICD-10-CM

## 2021-03-22 DIAGNOSIS — J810 Acute pulmonary edema: Secondary | ICD-10-CM

## 2021-03-22 DIAGNOSIS — I160 Hypertensive urgency: Secondary | ICD-10-CM

## 2021-03-22 LAB — BASIC METABOLIC PANEL
Anion gap: 10 (ref 5–15)
BUN: 19 mg/dL (ref 8–23)
CO2: 28 mmol/L (ref 22–32)
Calcium: 9.1 mg/dL (ref 8.9–10.3)
Chloride: 96 mmol/L — ABNORMAL LOW (ref 98–111)
Creatinine, Ser: 0.59 mg/dL (ref 0.44–1.00)
GFR, Estimated: >60 mL/min (ref >60)
Glucose, Bld: 97 mg/dL (ref 70–99)
Potassium: 3.5 mmol/L (ref 3.5–5.1)
Sodium: 134 mmol/L — ABNORMAL LOW (ref 135–145)

## 2021-03-22 LAB — CBC
HCT: 40.1 % (ref 36.0–46.0)
Hemoglobin: 13.1 g/dL (ref 12.0–15.0)
MCH: 30.5 pg (ref 26.0–34.0)
MCHC: 32.7 g/dL (ref 30.0–36.0)
MCV: 93.5 fL (ref 80.0–100.0)
Platelets: 178 10*3/uL (ref 150–400)
RBC: 4.29 MIL/uL (ref 3.87–5.11)
RDW: 13.6 % (ref 11.5–15.5)
WBC: 6.1 10*3/uL (ref 4.0–10.5)
nRBC: 0 % (ref 0.0–0.2)

## 2021-03-22 MED ORDER — FUROSEMIDE 40 MG PO TABS
40.0000 mg | ORAL_TABLET | Freq: Every day | ORAL | 0 refills | Status: DC
Start: 2021-03-23 — End: 2021-04-18

## 2021-03-22 MED ORDER — FUROSEMIDE 40 MG PO TABS: 40.0000 mg | ORAL_TABLET | Freq: Every day | ORAL | Status: DC

## 2021-03-22 MED ORDER — POTASSIUM CHLORIDE CRYS ER 20 MEQ PO TBCR: 20.0000 meq | EXTENDED_RELEASE_TABLET | Freq: Once | ORAL | Status: DC

## 2021-03-22 MED ORDER — LOSARTAN POTASSIUM 50 MG PO TABS: 50.0000 mg | ORAL_TABLET | Freq: Every day | ORAL | 0 refills | Status: DC

## 2021-03-22 MED ORDER — LOSARTAN POTASSIUM 50 MG PO TABS
50.0000 mg | ORAL_TABLET | Freq: Every day | ORAL | Status: DC
  Administered 2021-03-22: 50 mg via ORAL
  Filled 2021-03-22: qty 1

## 2021-03-22 MED ORDER — POTASSIUM CHLORIDE CRYS ER 20 MEQ PO TBCR: 20.0000 meq | EXTENDED_RELEASE_TABLET | Freq: Every day | ORAL | 0 refills | Status: DC

## 2021-03-22 MED ORDER — RIVAROXABAN 20 MG PO TABS
20.0000 mg | ORAL_TABLET | Freq: Every day | ORAL | 0 refills | Status: DC
Start: 2021-03-22 — End: 2021-04-18

## 2021-03-22 MED ORDER — METOPROLOL TARTRATE 50 MG PO TABS: 50.0000 mg | ORAL_TABLET | Freq: Two times a day (BID) | ORAL | 0 refills | Status: DC

## 2021-03-22 MED ORDER — POTASSIUM CHLORIDE CRYS ER 20 MEQ PO TBCR
40.0000 meq | EXTENDED_RELEASE_TABLET | Freq: Once | ORAL | Status: AC
  Administered 2021-03-22: 40 meq via ORAL
  Filled 2021-03-22: qty 2

## 2021-03-22 NOTE — Telephone Encounter (Signed)
TCM discharged 03/22/21. Call 03/23/21.

## 2021-03-22 NOTE — Telephone Encounter (Signed)
LMOV  

## 2021-03-22 NOTE — Progress Notes (Signed)
Progress Note  Patient Name: Deborah Baxter Date of Encounter: 03/22/2021  Primary Cardiologist: None  Subjective   Has only ambulated in the room.  No dyspnea - feels that she is likely back to baseline.  Notes good UO, though I/O inaccurate.  Still w/ L>R ankle edema.  Inpatient Medications    Scheduled Meds:  calcium-vitamin D  1 tablet Oral BID   furosemide  20 mg Intravenous Once   furosemide  40 mg Intravenous BID   losartan  25 mg Oral Daily   metoprolol tartrate  50 mg Oral BID   multivitamin with minerals  1 tablet Oral Daily   rivaroxaban  20 mg Oral Q supper   Continuous Infusions:  PRN Meds: acetaminophen **OR** acetaminophen, bisacodyl, HYDROcodone-acetaminophen, metoprolol tartrate, morphine injection, ondansetron **OR** ondansetron (ZOFRAN) IV, senna-docusate   Vital Signs    Vitals:   03/22/21 0300 03/22/21 0400 03/22/21 0500 03/22/21 0800  BP: (!) 165/111 125/86 (!) 156/94 (!) 146/96  Pulse:    83  Resp: 15 17 16  (!) 118  Temp:      TempSrc:      SpO2:   98% 98%  Weight:      Height:       No intake or output data in the 24 hours ending 03/22/21 0826 Filed Weights   03/20/21 1051  Weight: 74.8 kg    Physical Exam   GEN: Well nourished, well developed, in no acute distress.  HEENT: Grossly normal.  Neck: Supple, no JVD, carotid bruits, or masses. Cardiac: IR, IR, no murmurs, rubs, or gallops. No clubbing, cyanosis, trace to 1+ RLE edema and 2+ LLE edema.  Radials 2+, DP/PT 2+ and equal bilaterally.  Respiratory:  Respirations regular and unlabored, diminished breath sounds in R base, otw clear to auscultation bilaterally. GI: Soft, nontender, nondistended, BS + x 4. MS: no deformity or atrophy. Skin: warm and dry, no rash. Neuro:  Strength and sensation are intact. Psych: AAOx3.  Normal affect.  Labs    Chemistry Recent Labs  Lab 03/20/21 1041 03/20/21 1058 03/21/21 0559 03/22/21 0438  NA  --  135 135 134*  K  --  5.1 4.6 3.5  CL   --  100 99 96*  CO2  --  30 26 28   GLUCOSE  --  110* 118* 97  BUN  --  18 23 19   CREATININE  --  0.54 0.54 0.59  CALCIUM  --  9.6 9.7 9.1  PROT 6.7  --   --   --   ALBUMIN 3.7  --   --   --   AST 39  --   --   --   ALT 40  --   --   --   ALKPHOS 69  --   --   --   BILITOT 1.0  --   --   --   GFRNONAA  --  >60 >60 >60  ANIONGAP  --  5 10 10      Hematology Recent Labs  Lab 03/20/21 1058 03/21/21 0559 03/22/21 0438  WBC 6.0 6.3 6.1  RBC 4.16 4.09 4.29  HGB 13.2 13.0 13.1  HCT 40.2 39.1 40.1  MCV 96.6 95.6 93.5  MCH 31.7 31.8 30.5  MCHC 32.8 33.2 32.7  RDW 13.8 13.8 13.6  PLT 177 174 178    Cardiac Enzymes  Recent Labs  Lab 03/20/21 1058 03/20/21 1443  TROPONINIHS 20* 17      BNP Recent Labs  Lab 03/20/21 1058  BNP 335.6*    Lipids  Lab Results  Component Value Date   CHOL 196 01/29/2021   HDL 67.70 01/29/2021   LDLCALC 111 (H) 01/29/2021   LDLDIRECT 104.0 08/12/2017   TRIG 85.0 01/29/2021   CHOLHDL 3 01/29/2021   Lab Results  Component Value Date   TSH 3.046 03/20/2021    Radiology    DG Chest 2 View  Result Date: 03/20/2021 CLINICAL DATA:  sob EXAM: CHEST - 2 VIEW COMPARISON:  None. FINDINGS: The cardiomediastinal silhouette is enlarged in contour. Small LEFT-greater-than-RIGHT pleural effusion. No pneumothorax. Peribronchial cuffing and interstitial prominence. Bibasilar homogeneous opacities, likely passive atelectasis. Visualized abdomen is unremarkable. No acute osseous abnormality noted. IMPRESSION: 1. Small bilateral pleural effusions. 2. Constellation of findings are favored to reflect pulmonary edema with bibasilar atelectasis. Infection could present similarly. Electronically Signed   By: Valentino Saxon M.D.   On: 03/20/2021 13:04   Telemetry    Afib, PVCs, 80's - Personally Reviewed  Cardiac Studies   Echo 03/21/21  1. Left ventricular ejection fraction, by estimation, is 50 to 55%. The  left ventricle has low normal function.  The left ventricle has no regional  wall motion abnormalities. There is moderate left ventricular hypertrophy.  Left ventricular diastolic  function could not be evaluated.   2. Right ventricular systolic function is mildly reduced. The right  ventricular size is normal. There is normal pulmonary artery systolic  pressure.   3. Left atrial size was moderately dilated.   4. Right atrial size was moderately dilated.   5. Moderate pericardial effusion. The pericardial effusion is posterior  to the left ventricle. There is no evidence of cardiac tamponade.   6. The mitral valve is degenerative. Moderate mitral valve regurgitation.  Moderate to severe mitral annular calcification.   7. Tricuspid valve is mildly thickened. The tricuspid valve is abnormal.  Tricuspid valve regurgitation is moderate.   8. The aortic valve is tricuspid. There is mild thickening of the aortic  valve. Aortic valve regurgitation is trivial. Mild aortic valve sclerosis  is present, with no evidence of aortic valve stenosis.   9. There is mild dilatation of the ascending aorta, measuring 41 mm.  10. The inferior vena cava is normal in size with <50% respiratory  variability, suggesting right atrial pressure of 8 mmHg.   Conclusion(s)/Recommendation(s): Given moderate LVH, biatrial enlargement,  and low voltage on EKG, infiltrative cardiomyopathy, including  amyloidosis, should be considered.  _____________   Patient Profile     87 y.o. female  with no previously known history of heart failure or arrhythmia and with history of prior syncopal episode 11/21/2020, HLD, osteoarthritis, spinal stenosis, and allergies, previous tobacco use, and who is being seen today for the evaluation of new onset atrial fibrillation and found to have echo findings as directly above with HFpEF 50-55% and possible infiltrative CM considered.   Assessment & Plan    1.  Acute HFpEF:  Presented to ED 10/18 w/ dyspnea and found to be in  afib.  She's responded well to IV lasix.  Ongoing mild ankle edema, but otw appears euvolemic.  Will transition IV lasix to 40mg  PO daily for discharge w/ plan for early outpt f/u and bmet @ that time.  BP up  Increase losartan to 50 daily. SPEP/UPEP/serum light chains pending (pt to take home w/ her).  Consider cMRI vs PYP scan as outpt.  2.  Persistent Afib:  unknown duration.  Rates controlled. Cont current dose  of ? blocker and xarelto (new).  Mod BAE on echo.  Outpt f/u in a week w/ plan for DCCV in ~ 4 wks.  3.  Essential HTN:  BP's trending high.  Increase losartan to 50 daily.  Cont ? blocker.  4.  Moderate pericardial effusion:  plan limited echo as outpt.  Hemodynamically stable.  Signed, Murray Hodgkins, NP  03/22/2021, 8:26 AM    For questions or updates, please contact   Please consult [email or web address] for contact info under Cardiology/STEMI.

## 2021-03-22 NOTE — TOC Initial Note (Signed)
Transition of Care [facility]) - Initial/Assessment Note    Patient Details  Name: Deborah Baxter MRN: [id number] Date of Birth: May 01, 1934  Transition of Care [facility]) CM/SW Contact:    Ova Freshwater Phone Number: [phone] 03/22/2021, 2:26 PM  Clinical Narrative:                  Patient presents to [facility] due to SOB, hand pain and Afib.  Patient is independent with all ADLs and lives with her daughter Deborah Baxter [id number].  Patient stated she does not have or take prescribed medications. Patient is agreeable to home health PT and does not have a preference of agency.  CSW contacted [facility] w/ [place], and confirmed availability for patient home health.  CSW contacted [place] [facility] and requested delivery of a 5"' rolling walker.  Request was confirmed.  CSW has update RN and Attending.  TOC will continue to follow.       Patient Goals and CMS Choice        Expected Discharge Plan and Services                                                Prior Living Arrangements/Services                       Activities of Daily Living      Permission Sought/Granted                  Emotional Assessment              Admission diagnosis:  Atrial fibrillation with rapid ventricular response ([place]) [I48.91] Pulmonary edema [J81.1] Patient Active Problem List   Diagnosis Date Noted   Acute heart failure with preserved ejection fraction (H[facility])    Atrial fibrillation with rapid ventricular response (Deborah Baxter) 03/20/2021   Hypertensive urgency 03/20/2021   Pulmonary edema 03/20/2021   Left hand pain 03/20/2021   Stress 02/03/2021   Low back pain 01/29/2021   Syncope 12/05/2020   Elevated blood pressure reading 12/05/2020   Numbness and tingling in left hand 08/21/2017   Lumbago 07/13/2014   Health care maintenance 07/13/2014   Diverticulosis 09/05/2012   Hypercholesterolemia 06/27/2012   Lumbar spinal stenosis 06/27/2012   PCP:  Deborah Pheasant, MD Pharmacy:   [id number] [address] [place] [address] Phone: [phone] Fax: [phone]     Social Determinants of Health (SDOH) Interventions    Readmission Risk Interventions No flowsheet data found.

## 2021-03-22 NOTE — Discharge Summary (Signed)
Physician Discharge Summary  Deborah Baxter QMG:[id number] DOB: 14-Jun-1933 DOA: 03/20/2021  PCP: Einar Pheasant, MD  Admit date: 03/20/2021 Discharge date: 03/22/2021  Admitted From: home  Disposition:  home w/ home health   Recommendations for Outpatient Follow-up:  Follow up with PCP in 1-2 weeks F/u w/ cardio, Dr. Saunders Revel, in 1-2 weeks   Home Health: yes Equipment/Devices:  Discharge Condition: stable  CODE STATUS: full  Diet recommendation: Heart Healthy   Brief/Interim Summary: HPI was taken from Dr. Dena Billet: Deborah Baxter is a 87 y.o. female with medical history significant for arthritis with chronic back pain, hypertension noted in June 2022 and treated with diet, mild hyperlipidemia treated with diet, who presents to the emergency department on 03/20/2021 with fatigue and shortness of breath.  She originally presented to an urgent care office where an EKG revealed new onset atrial fibrillation with rapid ventricular response. Onset of patient's shortness of breath was 2 weeks prior to admission and duration is intermittent. She started noticing shortness of breath with walking to the mailbox and started needing to stop on her way because of shortness of breath. Shortness of breath occurs only with walking; it has been gradually worsening. Symptoms are alleviated by nothing and exacerbated by walking.  Associated symptoms: Her legs are swollen but she had not noticed it before. No weight change. No cough, wheezing, palpitations. She has has intermittent substernal chest tightness, without radiation, 3/10 tightness, without associated nausea or diaphoresis; she is not sure if the tightness is associated with shortness of breath and has not noticed if it is more with exertion; at time of admission she has no chest tightness. She has no history of CAD, CHF, atrial fibrillation, or other cardiac condition.  She does not take aspirin at home.   She initially went to urgent care for pain in her  left hand that began 10 days before admission, is intermittent, is located in the middle of her hand on palmar aspect near thenar eminence, does not radiate, is up to a 10/10 at times and characterized as sharp, is exacerbated by putting pressure on it or moving her hand and alleviated by not moving her hand. Worse with trying to tie shoes or get dressed. She recently started using a cane for her chronic back problems and initially was using it in her left hand but now has had to switch to her right hand. She started taking Advil recently for her wrist.   ED Course: EKG showed new onset atrial fibrillation with RVR.  Chest x-ray shows small bilateral pleural effusions and bilateral pulmonary edema.  Initial troponin 20.  Patient was tried on medication but continued to have rapid rate, so she was started on an IV diltiazem drip.   Hospital course from Dr. Jimmye Norman 10/19-10/20/22: Pt was found to have new onset a. fib and was started on metoprolol & xarelto. Also, pt was found to have likely acute diastolic CHF that was treated w/ IV lasix, metoprolol. Pt was d/c home w/ po lasix, KCL, metoprolol & lasix. Echo showed EF 50-55%, no regional wall motion abnormalities, diastolic function could not be evaluated & moderate pericardial effusion was noted (not no evidence of cardiac tamponade). Of note, a SPEP/UPEP/light chains was ordered by cardio to exclude infiltrative cardiomyopathy. For more information, please see previous progress/consult notes.   Discharge Diagnoses:  Principal Problem:   Atrial fibrillation with rapid ventricular response (HCC) Active Problems:   Lumbar spinal stenosis   Hypertensive urgency   Pulmonary edema  Left hand pain  A. fib: w/ RVR. New onset.CHA2DS2-VASc score is 4.  Continue on metoprolol & xarelto. Cardio following and recs apprec    HTN urgency: resolved but still w/ HTN. Continue on metoprolol, losartan    Pulmonary edema: likely secondary to acute diastolic CHF.  Echo showed EF 50-55%, no regional wall motion abnormalities, & diastolic function could not be evaluated.   Pericardial effusion: moderate & no evidence of cardiac tamponade as per cardio. SPEP/UPEP/light chains was ordered by cardio to exclude infiltrative cardiomyopathy. Management as per cardio    Lumbar spinal stenosis: continue on tylenol prn    Left hand pain: maybe muscle strain. Tylenol prn. F/u outpatient w/ PCP   Discharge Instructions  Discharge Instructions     Amb referral to [facility]   Complete by: As directed    Diet - low sodium heart healthy   Complete by: As directed    Discharge instructions   Complete by: As directed    F/u w/ cardio, Dr. Rockey Situ, in 1-2 weeks. F/u w/ PCP in 1-2 weeks   Increase activity slowly   Complete by: As directed       Allergies as of 03/22/2021       Reactions   Quinolones Other (See Comments)   Avoid FQ with Ao dilation        Medication List     STOP taking these medications    ibuprofen 200 MG tablet Commonly known as: ADVIL       TAKE these medications    acetaminophen 650 MG CR tablet Commonly known as: TYLENOL Take 650 mg by mouth every 8 (eight) hours as needed for pain. Reported on 08/02/2015   CALCIUM 500 + D PO Take 1 tablet by mouth 2 (two) times daily.   furosemide 40 MG tablet Commonly known as: LASIX Take 1 tablet (40 mg total) by mouth daily. Start taking on: March 23, 2021   losartan 50 MG tablet Commonly known as: COZAAR Take 1 tablet (50 mg total) by mouth daily. Start taking on: March 23, 2021   metoprolol tartrate 50 MG tablet Commonly known as: LOPRESSOR Take 1 tablet (50 mg total) by mouth 2 (two) times daily.   MIRALAX PO Take 17 g by mouth every 3 (three) days. Every 3 days   multivitamin tablet Take 1 tablet by mouth daily.   OVER THE COUNTER MEDICATION Place 1 drop into both eyes every 8 (eight) hours as needed (dry eyes.). Lubricant eye drops   potassium chloride  SA 20 MEQ tablet Commonly known as: KLOR-CON Take 1 tablet (20 mEq total) by mouth daily for 30 doses. Take this medication while taking lasix   rivaroxaban 20 MG Tabs tablet Commonly known as: XARELTO Take 1 tablet (20 mg total) by mouth daily with supper.               Durable Medical Equipment  (From admission, onward)           Start     Ordered   03/22/21 1512  For home use only DME Walker rolling  Once       Question Answer Comment  Walker: With [place] Wheels   Patient needs a walker to treat with the following condition Generalized weakness      03/22/21 1511            Follow-up Information     End, Harrell Gave, MD Follow up.   Specialty: Cardiology Why: Please call the office if  you do not receive a call to schedule this visit in the next few days. Contact information: [place] [address] [phone]                Allergies  Allergen Reactions   Quinolones Other (See Comments)    Avoid FQ with Ao dilation    Consultations: Cardio    Procedures/Studies: DG Chest 2 View  Result Date: 03/20/2021 CLINICAL DATA:  sob EXAM: CHEST - 2 VIEW COMPARISON:  None. FINDINGS: The cardiomediastinal silhouette is enlarged in contour. Small LEFT-greater-than-RIGHT pleural effusion. No pneumothorax. Peribronchial cuffing and interstitial prominence. Bibasilar homogeneous opacities, likely passive atelectasis. Visualized abdomen is unremarkable. No acute osseous abnormality noted. IMPRESSION: 1. Small bilateral pleural effusions. 2. Constellation of findings are favored to reflect pulmonary edema with bibasilar atelectasis. Infection could present similarly. Electronically Signed   By: Valentino Saxon M.D.   On: 03/20/2021 13:04   ECHOCARDIOGRAM COMPLETE  Result Date: 03/21/2021    ECHOCARDIOGRAM REPORT   Patient Name:   MARQUISE LAMBSON [employer] Date of Exam: 03/20/2021 Medical Rec #:  [id number]    Height:       67.0 in Accession  #:    [id number]   Weight:       165.0 lb Date of Birth:  1933/07/18    BSA:          1.863 m Patient Age:    87 years     BP:           129/81 mmHg Patient Gender: F            HR:           68 bpm. Exam Location:  [facility] Procedure: 2D Echo, Cardiac Doppler and Color Doppler Indications:     I48.91 Atrial Fibrillation  History:         Patient has no prior history of Echocardiogram examinations.                  Risk Factors:Dyslipidemia.  Sonographer:     Cresenciano Lick RDCS Referring Phys:  [id number] Tacey Ruiz Diagnosing Phys: Nelva Bush MD IMPRESSIONS  1. Left ventricular ejection fraction, by estimation, is 50 to 55%. The left ventricle has low normal function. The left ventricle has no regional wall motion abnormalities. There is moderate left ventricular hypertrophy. Left ventricular diastolic function could not be evaluated.  2. Right ventricular systolic function is mildly reduced. The right ventricular size is normal. There is normal pulmonary artery systolic pressure.  3. Left atrial size was moderately dilated.  4. Right atrial size was moderately dilated.  5. Moderate pericardial effusion. The pericardial effusion is posterior to the left ventricle. There is no evidence of cardiac tamponade.  6. The mitral valve is degenerative. Moderate mitral valve regurgitation. Moderate to severe mitral annular calcification.  7. Tricuspid valve is mildly thickened. The tricuspid valve is abnormal. Tricuspid valve regurgitation is moderate.  8. The aortic valve is tricuspid. There is mild thickening of the aortic valve. Aortic valve regurgitation is trivial. Mild aortic valve sclerosis is present, with no evidence of aortic valve stenosis.  9. There is mild dilatation of the ascending aorta, measuring 41 mm. 10. The inferior vena cava is normal in size with <50% respiratory variability, suggesting right atrial pressure of 8 mmHg. Conclusion(s)/Recommendation(s): Given moderate LVH, biatrial enlargement,  and low voltage on EKG, infiltrative cardiomyopathy, including amyloidosis, should be considered. FINDINGS  Left Ventricle: Left ventricular ejection fraction, by estimation,  is 50 to 55%. The left ventricle has low normal function. The left ventricle has no regional wall motion abnormalities. The left ventricular internal cavity size was normal in size. There is moderate left ventricular hypertrophy. Left ventricular diastolic function could not be evaluated due to atrial fibrillation. Left ventricular diastolic function could not be evaluated. Right Ventricle: The right ventricular size is normal. No increase in right ventricular wall thickness. Right ventricular systolic function is mildly reduced. There is normal pulmonary artery systolic pressure. The tricuspid regurgitant velocity is 2.55 m/s, and with an assumed right atrial pressure of 8 mmHg, the estimated right ventricular systolic pressure is 78.9 mmHg. Left Atrium: Left atrial size was moderately dilated. Right Atrium: Right atrial size was moderately dilated. Pericardium: A moderately sized pericardial effusion is present. The pericardial effusion is posterior to the left ventricle. There is no evidence of cardiac tamponade. Mitral Valve: The mitral valve is degenerative in appearance. There is moderate thickening of the mitral valve leaflet(s). Moderate to severe mitral annular calcification. Moderate mitral valve regurgitation. Tricuspid Valve: Tricuspid valve is mildly thickened. The tricuspid valve is abnormal. Tricuspid valve regurgitation is moderate. Aortic Valve: The aortic valve is tricuspid. There is mild thickening of the aortic valve. Aortic valve regurgitation is trivial. Mild aortic valve sclerosis is present, with no evidence of aortic valve stenosis. Pulmonic Valve: The pulmonic valve was not well visualized. Pulmonic valve regurgitation is mild. No evidence of pulmonic stenosis. Aorta: The aortic root is normal in size and structure.  There is mild dilatation of the ascending aorta, measuring 41 mm. Venous: The inferior vena cava is normal in size with less than 50% respiratory variability, suggesting right atrial pressure of 8 mmHg. IAS/Shunts: No atrial level shunt detected by color flow Doppler. Additional Comments: There is pleural effusion in the left lateral region.  LEFT VENTRICLE PLAX 2D LVIDd:         3.82 cm LVIDs:         2.62 cm LV PW:         1.51 cm LV IVS:        1.39 cm LVOT diam:     1.70 cm LV SV:         35 LV SV Index:   19 LVOT Area:     2.27 cm  RIGHT VENTRICLE             IVC RV Basal diam:  3.65 cm     IVC diam: 1.79 cm RV S prime:     10.80 cm/s TAPSE (M-mode): 1.6 cm LEFT ATRIUM             Index        RIGHT ATRIUM           Index LA diam:        4.50 cm 2.41 cm/m   RA Area:     22.00 cm LA Vol (A2C):   92.6 ml 49.69 ml/m  RA Volume:   71.20 ml  38.21 ml/m LA Vol (A4C):   68.9 ml 36.97 ml/m LA Biplane Vol: 84.2 ml 45.18 ml/m  AORTIC VALVE LVOT Vmax:   80.05 cm/s LVOT Vmean:  57.450 cm/s LVOT VTI:    0.152 m  AORTA Ao Root diam: 3.30 cm Ao Asc diam:  4.10 cm MV E velocity: 131.67 cm/s  TRICUSPID VALVE  TR Peak grad:   26.0 mmHg                             TR Vmax:        255.00 cm/s                              SHUNTS                             Systemic VTI:  0.15 m                             Systemic Diam: 1.70 cm Nelva Bush MD Electronically signed by Nelva Bush MD Signature Date/Time: 03/21/2021/7:20:33 AM    Final    (Echo, Carotid, EGD, Colonoscopy, ERCP)    Subjective: Pt c/o fatigue    Discharge Exam: Vitals:   03/22/21 1000 03/22/21 1300  BP: (!) 141/98 (!) 165/79  Pulse: 84 77  Resp: 18 16  Temp:    SpO2: 96% 96%   Vitals:   03/22/21 0500 03/22/21 0800 03/22/21 1000 03/22/21 1300  BP: (!) 156/94 (!) 146/96 (!) 141/98 (!) 165/79  Pulse:  83 84 77  Resp: 16 18 18 16   Temp:      TempSrc:      SpO2: 98% 98% 96% 96%  Weight:      Height:         General: Pt is alert, awake, not in acute distress. Frail appearing  Cardiovascular: irregularly irregular, no rubs, no gallops Respiratory: diminished breath sounds b/l otherwise clear Abdominal: Soft, NT, ND, bowel sounds + Extremities:  no cyanosis    The results of significant diagnostics from this hospitalization (including imaging, microbiology, ancillary and laboratory) are listed below for reference.     Microbiology: No results found for this or any previous visit (from the past 240 hour(s)).   Labs: BNP (last 3 results) Recent Labs    03/20/21 1058  BNP 213.0*   Basic Metabolic Panel: Recent Labs  Lab 03/20/21 1058 03/21/21 0559 03/22/21 0438  NA 135 135 134*  K 5.1 4.6 3.5  CL 100 99 96*  CO2 30 26 28   GLUCOSE 110* 118* 97  BUN 18 23 19   CREATININE 0.54 0.54 0.59  CALCIUM 9.6 9.7 9.1   Liver Function Tests: Recent Labs  Lab 03/20/21 1041  AST 39  ALT 40  ALKPHOS 69  BILITOT 1.0  PROT 6.7  ALBUMIN 3.7   No results for input(s): LIPASE, AMYLASE in the last 168 hours. No results for input(s): AMMONIA in the last 168 hours. CBC: Recent Labs  Lab 03/20/21 1058 03/21/21 0559 03/22/21 0438  WBC 6.0 6.3 6.1  HGB 13.2 13.0 13.1  HCT 40.2 39.1 40.1  MCV 96.6 95.6 93.5  PLT 177 174 178   Cardiac Enzymes: No results for input(s): CKTOTAL, CKMB, CKMBINDEX, TROPONINI in the last 168 hours. BNP: Invalid input(s): POCBNP CBG: Recent Labs  Lab 03/21/21 2027  GLUCAP 152*   D-Dimer No results for input(s): DDIMER in the last 72 hours. Hgb A1c No results for input(s): HGBA1C in the last 72 hours. Lipid Profile No results for input(s): CHOL, HDL, LDLCALC, TRIG, CHOLHDL, LDLDIRECT in the last 72 hours. Thyroid function studies Recent Labs    03/20/21 1041  TSH 3.046  Anemia work up No results for input(s): VITAMINB12, FOLATE, FERRITIN, TIBC, IRON, RETICCTPCT in the last 72 hours. Urinalysis No results found for: COLORURINE,  APPEARANCEUR, LABSPEC, [place], GLUCOSEU, HGBUR, BILIRUBINUR, KETONESUR, PROTEINUR, UROBILINOGEN, NITRITE, LEUKOCYTESUR Sepsis Labs Invalid input(s): PROCALCITONIN,  WBC,  LACTICIDVEN Microbiology No results found for this or any previous visit (from the past 240 hour(s)).   Time coordinating discharge: Over 30 minutes  SIGNED:   Wyvonnia Dusky, MD  [facility] 03/22/2021, 5:04 PM Pager   If 7PM-7AM, please contact night-coverage

## 2021-03-23 ENCOUNTER — Encounter: Payer: Self-pay | Admitting: Internal Medicine

## 2021-03-25 DIAGNOSIS — K5790 Diverticulosis of intestine, part unspecified, without perforation or abscess without bleeding: Secondary | ICD-10-CM | POA: Diagnosis not present

## 2021-03-25 DIAGNOSIS — E7800 Pure hypercholesterolemia, unspecified: Secondary | ICD-10-CM | POA: Diagnosis not present

## 2021-03-25 DIAGNOSIS — I4891 Unspecified atrial fibrillation: Secondary | ICD-10-CM | POA: Diagnosis not present

## 2021-03-25 DIAGNOSIS — I110 Hypertensive heart disease with heart failure: Secondary | ICD-10-CM | POA: Diagnosis not present

## 2021-03-25 DIAGNOSIS — Z9181 History of falling: Secondary | ICD-10-CM | POA: Diagnosis not present

## 2021-03-25 DIAGNOSIS — M48061 Spinal stenosis, lumbar region without neurogenic claudication: Secondary | ICD-10-CM | POA: Diagnosis not present

## 2021-03-25 DIAGNOSIS — I5031 Acute diastolic (congestive) heart failure: Secondary | ICD-10-CM | POA: Diagnosis not present

## 2021-03-25 DIAGNOSIS — Z7901 Long term (current) use of anticoagulants: Secondary | ICD-10-CM | POA: Diagnosis not present

## 2021-03-25 DIAGNOSIS — M1990 Unspecified osteoarthritis, unspecified site: Secondary | ICD-10-CM | POA: Diagnosis not present

## 2021-03-25 DIAGNOSIS — G8929 Other chronic pain: Secondary | ICD-10-CM | POA: Diagnosis not present

## 2021-03-25 DIAGNOSIS — Z87891 Personal history of nicotine dependence: Secondary | ICD-10-CM | POA: Diagnosis not present

## 2021-03-26 LAB — PROTEIN ELECTROPHORESIS, SERUM
A/G Ratio: 1.2 (ref 0.7–1.7)
Albumin ELP: 3.1 g/dL (ref 2.9–4.4)
Alpha-1-Globulin: 0.3 g/dL (ref 0.0–0.4)
Alpha-2-Globulin: 0.6 g/dL (ref 0.4–1.0)
Beta Globulin: 0.8 g/dL (ref 0.7–1.3)
Gamma Globulin: 0.9 g/dL (ref 0.4–1.8)
Globulin, Total: 2.5 g/dL (ref 2.2–3.9)
Total Protein ELP: 5.6 g/dL — ABNORMAL LOW (ref 6.0–8.5)

## 2021-03-26 NOTE — Telephone Encounter (Signed)
Patient contacted regarding discharge from [facility] on Thursday 03/22/21.  Patient understands to follow up with provider Marrianne Mood, PA on Friday 03/30/21 at 3:00 pm at the [place] office. Patient understands discharge instructions? yes Patient understands medications and regiment? yes Patient understands to bring all medications to this visit? yes  Ask patient:  Are you enrolled in My Chart- Yes

## 2021-03-26 NOTE — Telephone Encounter (Signed)
Please confirm we have the correct medication list (since she said new medication).  Reviewed our list - ok for her to try melatonin.  Take 1-2 hours before bed - start with 3mg .

## 2021-03-26 NOTE — Telephone Encounter (Signed)
I do not see any meds on her med list that are contraindicated with melatonin but wanted to confirm with you that you are ok with her taking melatonin.

## 2021-03-27 DIAGNOSIS — M9903 Segmental and somatic dysfunction of lumbar region: Secondary | ICD-10-CM | POA: Diagnosis not present

## 2021-03-27 DIAGNOSIS — M5136 Other intervertebral disc degeneration, lumbar region: Secondary | ICD-10-CM | POA: Diagnosis not present

## 2021-03-27 DIAGNOSIS — M9905 Segmental and somatic dysfunction of pelvic region: Secondary | ICD-10-CM | POA: Diagnosis not present

## 2021-03-27 DIAGNOSIS — M955 Acquired deformity of pelvis: Secondary | ICD-10-CM | POA: Diagnosis not present

## 2021-03-29 DIAGNOSIS — G8929 Other chronic pain: Secondary | ICD-10-CM | POA: Diagnosis not present

## 2021-03-29 DIAGNOSIS — M1990 Unspecified osteoarthritis, unspecified site: Secondary | ICD-10-CM | POA: Diagnosis not present

## 2021-03-29 DIAGNOSIS — I5031 Acute diastolic (congestive) heart failure: Secondary | ICD-10-CM | POA: Diagnosis not present

## 2021-03-29 DIAGNOSIS — I4891 Unspecified atrial fibrillation: Secondary | ICD-10-CM | POA: Diagnosis not present

## 2021-03-29 DIAGNOSIS — M48061 Spinal stenosis, lumbar region without neurogenic claudication: Secondary | ICD-10-CM | POA: Diagnosis not present

## 2021-03-29 DIAGNOSIS — I110 Hypertensive heart disease with heart failure: Secondary | ICD-10-CM | POA: Diagnosis not present

## 2021-03-30 ENCOUNTER — Other Ambulatory Visit: Payer: Self-pay

## 2021-03-30 ENCOUNTER — Encounter: Payer: Self-pay | Admitting: Physician Assistant

## 2021-03-30 ENCOUNTER — Other Ambulatory Visit
Admission: RE | Admit: 2021-03-30 | Discharge: 2021-03-30 | Disposition: A | Discharge status: Home / Self Care | Payer: Medicare Other | Source: Ambulatory Visit | Attending: Physician Assistant | Admitting: Physician Assistant

## 2021-03-30 ENCOUNTER — Ambulatory Visit (INDEPENDENT_AMBULATORY_CARE_PROVIDER_SITE_OTHER): Payer: Medicare Other | Admitting: Physician Assistant

## 2021-03-30 VITALS — BP 140/90 | HR 94 | Ht 67.0 in | Wt 168.0 lb

## 2021-03-30 DIAGNOSIS — I059 Rheumatic mitral valve disease, unspecified: Secondary | ICD-10-CM | POA: Diagnosis not present

## 2021-03-30 DIAGNOSIS — I10 Essential (primary) hypertension: Secondary | ICD-10-CM | POA: Insufficient documentation

## 2021-03-30 DIAGNOSIS — I071 Rheumatic tricuspid insufficiency: Secondary | ICD-10-CM

## 2021-03-30 DIAGNOSIS — I3139 Other pericardial effusion (noninflammatory): Secondary | ICD-10-CM | POA: Diagnosis not present

## 2021-03-30 DIAGNOSIS — M1990 Unspecified osteoarthritis, unspecified site: Secondary | ICD-10-CM | POA: Diagnosis not present

## 2021-03-30 DIAGNOSIS — I5030 Unspecified diastolic (congestive) heart failure: Secondary | ICD-10-CM

## 2021-03-30 DIAGNOSIS — I110 Hypertensive heart disease with heart failure: Secondary | ICD-10-CM | POA: Diagnosis not present

## 2021-03-30 DIAGNOSIS — I4891 Unspecified atrial fibrillation: Secondary | ICD-10-CM | POA: Diagnosis not present

## 2021-03-30 DIAGNOSIS — I77819 Aortic ectasia, unspecified site: Secondary | ICD-10-CM | POA: Diagnosis not present

## 2021-03-30 DIAGNOSIS — G8929 Other chronic pain: Secondary | ICD-10-CM | POA: Diagnosis not present

## 2021-03-30 DIAGNOSIS — M48061 Spinal stenosis, lumbar region without neurogenic claudication: Secondary | ICD-10-CM | POA: Diagnosis not present

## 2021-03-30 DIAGNOSIS — I5031 Acute diastolic (congestive) heart failure: Secondary | ICD-10-CM | POA: Diagnosis not present

## 2021-03-30 LAB — BASIC METABOLIC PANEL
Anion gap: 5 (ref 5–15)
BUN: 22 mg/dL (ref 8–23)
CO2: 30 mmol/L (ref 22–32)
Calcium: 9.3 mg/dL (ref 8.9–10.3)
Chloride: 101 mmol/L (ref 98–111)
Creatinine, Ser: 0.64 mg/dL (ref 0.44–1.00)
GFR, Estimated: >60 mL/min (ref >60)
Glucose, Bld: 105 mg/dL — ABNORMAL HIGH (ref 70–99)
Potassium: 4.7 mmol/L (ref 3.5–5.1)
Sodium: 136 mmol/L (ref 135–145)

## 2021-03-30 LAB — CBC
HCT: 40.5 % (ref 36.0–46.0)
Hemoglobin: 13.5 g/dL (ref 12.0–15.0)
MCH: 31.8 pg (ref 26.0–34.0)
MCHC: 33.3 g/dL (ref 30.0–36.0)
MCV: 95.3 fL (ref 80.0–100.0)
Platelets: 174 10*3/uL (ref 150–400)
RBC: 4.25 MIL/uL (ref 3.87–5.11)
RDW: 13.4 % (ref 11.5–15.5)
WBC: 6.3 10*3/uL (ref 4.0–10.5)
nRBC: 0 % (ref 0.0–0.2)

## 2021-03-30 NOTE — Patient Instructions (Signed)
Medication Instructions:  - Your physician has recommended you make the following change in your medication:   1) Lasix (furosemide) 40 mg: - take 2 tablets (80 mg) by mouth once daily x 3 days, then - resume 1 tablet (40 mg) by mouth once daily  2) Potassium 20 meq: - take 2 tablets (40 meq) by mouth once daily x 3 days, then - resume 1 tablet (20 meq) by mouth once daily   *If you need a refill on your cardiac medications before your next appointment, please call your pharmacy*   Lab Work: - Your physician recommends that you have lab work today:  BMP/ CBC/ SPEP/ UPEP/ Light Chains- 24 hour urine  Nature conservation officer at [facility] 1st desk on the right to check in   If you have labs (blood work) drawn today and your tests are completely normal, you will receive your results only by: [place] (if you have [place]) OR A paper copy in the mail If you have any lab test that is abnormal or we need to change your treatment, we will call you to review the results.   Testing/Procedures: - Your physician has requested that you have a limited echocardiogram. Echocardiography is a painless test that uses sound waves to create images of your heart. It provides your doctor with information about the size and shape of your heart and how well your heart's chambers and valves are working. This procedure takes approximately one hour. There are no restrictions for this procedure.    Follow-Up: At [facility], you and your health needs are our priority.  As part of our continuing mission to provide you with exceptional heart care, we have created designated Provider Care Teams.  These Care Teams include your primary Cardiologist (physician) and Advanced Practice Providers (APPs -  Physician Assistants and Nurse Practitioners) who all work together to provide you with the care you need, when you need it.  We recommend signing up for the patient portal called "MyChart".  Sign up information is  provided on this After Visit Summary.  MyChart is used to connect with patients for Virtual Visits (Telemedicine).  Patients are able to view lab/test results, encounter notes, upcoming appointments, etc.  Non-urgent messages can be sent to your provider as well.   To learn more about what you can do with MyChart, go to [email or web address].    Your next appointment:   2 week(s)  The format for your next appointment:   In Person  Provider:   You may see Nelva Bush, MD or one of the following Advanced Practice Providers on your designated Care Team:   Murray Hodgkins, NP Christell Faith, PA-C Marrianne Mood, PA-C Cadence Kathlen Mody, [place]   Other Instructions N/a

## 2021-03-30 NOTE — Progress Notes (Addendum)
Office Visit    Patient Name: Deborah Baxter Date of Encounter: 03/30/2021  PCP:  Deborah Baxter, [place]  Cardiologist:  Dr. Saunders Baxter Advanced Practice Provider:  No care team member to display Electrophysiologist:  None [phone]   Chief Complaint    Chief Complaint  Patient presents with   Hospitalization [facility] follow up. Medications verbally reviewed with patient.     87 y.o. 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  Past Medical History    Past Medical History:  Diagnosis Date   Hx of degenerative disc disease    Hypercholesterolemia    Neuromuscular disorder ([place])    pinched nerve   Osteoarthritis    Seasonal allergies    Spinal stenosis    chronic back and leg pain   Past Surgical History:  Procedure Laterality Date   APPENDECTOMY     bladder tack surgery  2000   BTL  1976   CARPAL TUNNEL RELEASE     CATARACT EXTRACTION W/PHACO Left 12/07/2015   Procedure: CATARACT EXTRACTION PHACO AND INTRAOCULAR LENS PLACEMENT ([place]);  Surgeon: Deborah Bear, MD;  Location: [facility] ORS;  Service: Ophthalmology;  Laterality: Left;  [place] 1.23 AP% 23.2 CDE 19.28 Fluid Pack Lot # [id number]   EYE SURGERY  03/19/15   GANGLION CYST EXCISION     right hand   GAS/FLUID EXCHANGE Left 03/08/2015   Procedure: GAS/FLUID EXCHANGE;  Surgeon: Deborah Height, MD;  Location: [facility] ORS;  Service: Ophthalmology;  Laterality: Left;   TONSILLECTOMY     TUBAL LIGATION     VITRECTOMY 25 GAUGE WITH SCLERAL BUCKLE Left 03/08/2015   Procedure: VITRECTOMY 25 GAUGE, membrane peel, 26 %  SF6 gas exchange;  Surgeon: Deborah Height, MD;  Location: [facility] ORS;  Service: Ophthalmology;  Laterality: Left;  casette lot # [id number]     Allergies  Allergies  Allergen Reactions   Quinolones Other (See Comments)    Avoid FQ with Ao dilation    History of Present Illness    Deborah Baxter is a 87 y.o. female with PMH as above. She reports previous tobacco use approximately 45 years ago.  She has a family history of heart disease in her father but cannot recall the specifics surrounding this family history.   Prior 11/21/2020 emergency department visit for syncope.  Orthostatics negative.  Labs WNL.  EKG not available to me but documented at that time as NSR, previous septal infarct, T wave abnormalities in the anterior leads.   On 10/18, she presented to the emergency department after presenting to urgent care with hand pain and found to be in atrial fibrillation of unknown chronicity.  She had DOE over the last few weeks, such as when walking to the mailbox.     In the ED, initial vitals showed BP 164/113, ventricular rate 134, 95% on room air.  Labs showed sodium 135, potassium 5.1, glucose 110, creatinine 0.54, BUN 18, BNP 335.6, high-sensitivity troponin 20  17, hemoglobin 13.2, hematocrit 40.2, TSH 3.046, free T4 1.07.  Chest x-ray showed small bilateral pleural effusions and findings thought to reflect pulmonary edema with bibasilar atelectasis versus infection. EKG showed atrial fibrillation with ventricular rate 121 bpm, low voltage QRS, poor R wave progression notable in the anterior  leads.    Echo as below showed EF 50-55%, NRWMA, moderate LVH, mildly reduced RVSF, moderate biatrial enlargement, moderate pericardial effusion located posterior to the LV without evidence of tamponade, degenerative MV with moderate MR and moderate to severe mitral annular calcification, thickened TV with abnormal valve and moderate TR, mild thickening of the AV with trivial AR and sclerosis without stenosis, mild dilation of the ascending aorta at 54mm, and RAP 48mmHg.   Given moderate LVH, biatrial enlargement, and low voltage EKG, it was  recommended that infiltrative CM including amyloidosis be considered.  During admission, labs started for SPEP/UPEP and light chains but not completed.   She was started on rate control and anticoagulation for her atrial fibrillation.  Given her volume overload on exam, she was started on diuresis with improvement in both LEE and her breathing status.  Today, 03/30/21, she returns to clinic and notes she has been doing well since her admission. She denies any tachypalpitations. She reports improvement in her energy. She still feels winded when she goes out to pick up sticks or goes to the mailbox. She notes that she felt winded on the walk into the clinic just now. Her appetite has been good. She reports that her hand is better, though they never did treat it in the hospital. She is not checking her BP at home at this time. She reports medication compliance and denies any s/sx of bleeding. She has not missed any doses of anticoagulation. She wonders about driving.   Home Medications   Current Outpatient Medications  Medication Instructions   acetaminophen (TYLENOL) 650 mg, Oral, Every 8 hours PRN, Reported on 08/02/2015   Calcium Carbonate-Vitamin D (CALCIUM 500 + D PO) 1 tablet, Oral, 2 times daily   furosemide (LASIX) 40 mg, Oral, Daily   losartan (COZAAR) 50 mg, Oral, Daily   metoprolol tartrate (LOPRESSOR) 50 mg, Oral, 2 times daily   Multiple Vitamin (MULTIVITAMIN) tablet 1 tablet, Daily   OVER THE COUNTER MEDICATION 1 drop, Both Eyes, Every 8 hours PRN, Lubricant eye drops    Polyethylene Glycol 3350 (MIRALAX PO) 17 g, Oral, every 72 hours, Every 3 days    potassium chloride SA (KLOR-CON) 20 MEQ tablet 20 mEq, Oral, Daily, Take this medication while taking lasix   rivaroxaban (XARELTO) 20 mg, Oral, Daily with supper     Review of Systems    She denies chest pain, palpitations, pnd, orthopnea, n, v, dizziness, syncope, edema, weight gain, or early satiety. She reports improvement in  DOE and energy LEE has improved.   All other systems reviewed and are otherwise negative except as noted above.  Physical Exam    VS:  BP 140/90 (BP Location: Left Arm, Patient Position: Sitting, Cuff Size: Normal)   Pulse 94   Ht 5\' 7"  (1.702 m)   Wt 168 lb (76.2 kg)   LMP 06/26/1985   SpO2 97%   BMI 26.31 kg/m  , BMI Body mass index is 26.31 kg/m. CXK:GYJEHUD female, joined by her daughter. HEENT: normal. Neck: Supple, no JVD, carotid bruits, or masses. Cardiac: IRIR, 2/6 systolic murmur, rubs, or gallops. No clubbing, cyanosis, mild bilateral edema.  Radials/DP/PT 2+ and equal bilaterally.  Respiratory:  Respirations regular and unlabored, R sided crackles otherwise clear to ausculation GI: Soft, nontender, nondistended, BS + x 4. MS: no deformity or atrophy. Skin: warm and dry, no rash. Neuro:  Strength and sensation are intact. Psych: Normal affect.  Accessory Clinical Findings    ECG personally reviewed by  me today - atrial fibrillation with ventricular rate 94bpm, low voltage EKG, baseline wander in lead I, poor R wave progression in inferior leads and poor R wave progression in precordial leads- no acute changes.  VITALS Reviewed today   Temp Readings from Last 3 Encounters:  03/20/21 97.7 F (36.5 C) (Oral)  01/29/21 97.9 F (36.6 C)  11/30/20 97.8 F (36.6 C)   BP Readings from Last 3 Encounters:  03/30/21 140/90  03/22/21 (!) 165/79  01/29/21 134/78   Pulse Readings from Last 3 Encounters:  03/30/21 94  03/22/21 77  01/29/21 94    Wt Readings from Last 3 Encounters:  03/30/21 168 lb (76.2 kg)  03/20/21 165 lb (74.8 kg)  01/29/21 160 lb (72.6 kg)     LABS  reviewed today   Lab Results  Component Value Date   WBC 6.1 03/22/2021   HGB 13.1 03/22/2021   HCT 40.1 03/22/2021   MCV 93.5 03/22/2021   PLT 178 03/22/2021   Lab Results  Component Value Date   CREATININE 0.59 03/22/2021   BUN 19 03/22/2021   NA 134 (L) 03/22/2021   K 3.5 03/22/2021    CL 96 (L) 03/22/2021   CO2 28 03/22/2021   Lab Results  Component Value Date   ALT 40 03/20/2021   AST 39 03/20/2021   ALKPHOS 69 03/20/2021   BILITOT 1.0 03/20/2021   Lab Results  Component Value Date   CHOL 196 01/29/2021   HDL 67.70 01/29/2021   LDLCALC 111 (H) 01/29/2021   LDLDIRECT 104.0 08/12/2017   TRIG 85.0 01/29/2021   CHOLHDL 3 01/29/2021    No results found for: HGBA1C Lab Results  Component Value Date   TSH 3.046 03/20/2021     STUDIES/PROCEDURES reviewed today   Echo 03/21/21  1. Left ventricular ejection fraction, by estimation, is 50 to 55%. The  left ventricle has low normal function. The left ventricle has no regional  wall motion abnormalities. There is moderate left ventricular hypertrophy.  Left ventricular diastolic  function could not be evaluated.   2. Right ventricular systolic function is mildly reduced. The right  ventricular size is normal. There is normal pulmonary artery systolic  pressure.   3. Left atrial size was moderately dilated.   4. Right atrial size was moderately dilated.   5. Moderate pericardial effusion. The pericardial effusion is posterior  to the left ventricle. There is no evidence of cardiac tamponade.   6. The mitral valve is degenerative. Moderate mitral valve regurgitation.  Moderate to severe mitral annular calcification.   7. Tricuspid valve is mildly thickened. The tricuspid valve is abnormal.  Tricuspid valve regurgitation is moderate.   8. The aortic valve is tricuspid. There is mild thickening of the aortic  valve. Aortic valve regurgitation is trivial. Mild aortic valve sclerosis  is present, with no evidence of aortic valve stenosis.   9. There is mild dilatation of the ascending aorta, measuring 41 mm.  10. The inferior vena cava is normal in size with <50% respiratory  variability, suggesting right atrial pressure of 8 mmHg.   Conclusion(s)/Recommendation(s): Given moderate LVH, biatrial enlargement,   and low voltage on EKG, infiltrative cardiomyopathy, including  amyloidosis, should be considered.     Assessment & Plan    Persistent Atrial fibrillation with RVR Relatively asymptomatic, though suspect Afib has contributed to her elevated volume status and thus DOE and mild overload. Remains in atrial fibrillation of unknown chronicity. Improving DOE.  TSH WNL.  Echo as  above with low nl EF and further recommendations as below. Continue current lopressor.   Given increase in diuresis as below for ongoing mild overload, will hold off on any increase in BB dose for rate control or consolidation to Toprol until RTC. Continue Xarelto given CHA2DS2VASc score of at least  3 (CHF, age x2, female). She has not missed any doses of anticoagulation and denies any s/sx of bleeding. Check BMET, CBC Discussed DCCV today. She will be therapeutic on anticoagulation by 04/20/21. Given plan for diuresis, pt will defer scheduling DCCV and we can revisit scheduling it at RTC in 2 weeks. She will think about the procedure between visits.     AOC HFmrEF EF 50-55%  --Reports improved dyspnea and LEE since started on diuresis.  Still mildly volume up on exam with recommendations for diuresis as below. Echo obtained with EF 50-55%, moderate LVH, pericardial effusion without evidence of tamponade and findings as copied above under CV studies. Recommendation has been to rule out infiltrative CM as above. Increase to lasix 80mg  x3d then drop back down to lasix 40mg  daily thereafter Increase KCL tab to 56mEq x3 days then drop down to 22mEq daily thereafter Continue current Losartan Check BMET today and at RTC CHF education. Na/fluid restrictions. Discussed imaging / possible future workup (cMRI/PYP scan) to exclude amyloidosis / infiltrative CM given LVH, bi-atrial enlargement, low voltage EKG.  For now, reordered SPEP/UPEP/light chains, as these were not completed during her admission Ordered future repeat limited  echo to monitor effusion size    Pericardial effusion, moderate --Moderate pericardial effusion noted on echo as above.  Ordered repeat limited echo to reassess.    Essential hypertension --BP suboptimal today. Suspect some improvement with increased diuresis for 3 days. Continue current dose BB, Losartan. Goal BP 130/80 or lower. Consider increase  of BB (and consolidation to Toprol) for both rate and BP control at RTC if rates and BP still elevated at that time. Reviewed recommendation for low salt diet, monitoring fluids, and staying active as tolerated.   Valvular dz --As above under CV studies. Degenerative MV with moderate MR and moderate to severe annular calcification, moderate TR with thickening, mild thickening of ao valve. Periodic echo to monitor. Reviewed with pt. HR/BP control recommended.   Mild dilation of ascending Ao --Mildly dilated at 39mm by echo. Avoid FQ, heavy lifting. BP, HR, LDL control.   *Please be aware that the above documentation was completed voice recognition software and may contain dictation errors.    Arvil Chaco, PA-C 03/30/2021

## 2021-04-03 ENCOUNTER — Other Ambulatory Visit
Admission: RE | Admit: 2021-04-03 | Discharge: 2021-04-03 | Disposition: A | Discharge status: Home / Self Care | Payer: Medicare Other | Source: Ambulatory Visit | Attending: Physician Assistant | Admitting: Physician Assistant

## 2021-04-03 DIAGNOSIS — I10 Essential (primary) hypertension: Secondary | ICD-10-CM | POA: Insufficient documentation

## 2021-04-03 DIAGNOSIS — M48061 Spinal stenosis, lumbar region without neurogenic claudication: Secondary | ICD-10-CM | POA: Diagnosis not present

## 2021-04-03 DIAGNOSIS — G8929 Other chronic pain: Secondary | ICD-10-CM | POA: Diagnosis not present

## 2021-04-03 DIAGNOSIS — M1990 Unspecified osteoarthritis, unspecified site: Secondary | ICD-10-CM | POA: Diagnosis not present

## 2021-04-03 DIAGNOSIS — I110 Hypertensive heart disease with heart failure: Secondary | ICD-10-CM | POA: Diagnosis not present

## 2021-04-03 DIAGNOSIS — I4891 Unspecified atrial fibrillation: Secondary | ICD-10-CM | POA: Diagnosis not present

## 2021-04-03 DIAGNOSIS — I5031 Acute diastolic (congestive) heart failure: Secondary | ICD-10-CM | POA: Diagnosis not present

## 2021-04-05 DIAGNOSIS — I5031 Acute diastolic (congestive) heart failure: Secondary | ICD-10-CM | POA: Diagnosis not present

## 2021-04-05 DIAGNOSIS — I110 Hypertensive heart disease with heart failure: Secondary | ICD-10-CM | POA: Diagnosis not present

## 2021-04-05 DIAGNOSIS — M48061 Spinal stenosis, lumbar region without neurogenic claudication: Secondary | ICD-10-CM | POA: Diagnosis not present

## 2021-04-05 DIAGNOSIS — I4891 Unspecified atrial fibrillation: Secondary | ICD-10-CM | POA: Diagnosis not present

## 2021-04-05 DIAGNOSIS — M1990 Unspecified osteoarthritis, unspecified site: Secondary | ICD-10-CM | POA: Diagnosis not present

## 2021-04-05 DIAGNOSIS — G8929 Other chronic pain: Secondary | ICD-10-CM | POA: Diagnosis not present

## 2021-04-05 LAB — UPEP/UIFE/LIGHT CHAINS/TP, 24-HR UR
% BETA, Urine: 0 %
ALPHA 1 URINE: 0 %
Albumin, U: 100 %
Alpha 2, Urine: 0 %
Free Kappa Lt Chains,Ur: 1.61 mg/L (ref 1.17–86.46)
Free Kappa/Lambda Ratio: >2.4 (ref 1.83–14.26)
Free Lambda Lt Chains,Ur: <0.67 mg/L (ref 0.27–15.21)
GAMMA GLOBULIN URINE: 0 %
Total Protein, Urine-Ur/day: 118 mg/24 hr (ref 30–150)
Total Protein, Urine: 7.4 mg/dL
Total Volume: 1600

## 2021-04-09 DIAGNOSIS — I110 Hypertensive heart disease with heart failure: Secondary | ICD-10-CM | POA: Diagnosis not present

## 2021-04-09 DIAGNOSIS — I5031 Acute diastolic (congestive) heart failure: Secondary | ICD-10-CM | POA: Diagnosis not present

## 2021-04-09 DIAGNOSIS — M1990 Unspecified osteoarthritis, unspecified site: Secondary | ICD-10-CM | POA: Diagnosis not present

## 2021-04-09 DIAGNOSIS — I4891 Unspecified atrial fibrillation: Secondary | ICD-10-CM | POA: Diagnosis not present

## 2021-04-09 DIAGNOSIS — M48061 Spinal stenosis, lumbar region without neurogenic claudication: Secondary | ICD-10-CM | POA: Diagnosis not present

## 2021-04-09 DIAGNOSIS — G8929 Other chronic pain: Secondary | ICD-10-CM | POA: Diagnosis not present

## 2021-04-12 DIAGNOSIS — I110 Hypertensive heart disease with heart failure: Secondary | ICD-10-CM | POA: Diagnosis not present

## 2021-04-12 DIAGNOSIS — I4891 Unspecified atrial fibrillation: Secondary | ICD-10-CM | POA: Diagnosis not present

## 2021-04-12 DIAGNOSIS — M1990 Unspecified osteoarthritis, unspecified site: Secondary | ICD-10-CM | POA: Diagnosis not present

## 2021-04-12 DIAGNOSIS — M48061 Spinal stenosis, lumbar region without neurogenic claudication: Secondary | ICD-10-CM | POA: Diagnosis not present

## 2021-04-12 DIAGNOSIS — G8929 Other chronic pain: Secondary | ICD-10-CM | POA: Diagnosis not present

## 2021-04-12 DIAGNOSIS — I5031 Acute diastolic (congestive) heart failure: Secondary | ICD-10-CM | POA: Diagnosis not present

## 2021-04-16 DIAGNOSIS — I4891 Unspecified atrial fibrillation: Secondary | ICD-10-CM | POA: Diagnosis not present

## 2021-04-16 DIAGNOSIS — M48061 Spinal stenosis, lumbar region without neurogenic claudication: Secondary | ICD-10-CM | POA: Diagnosis not present

## 2021-04-16 DIAGNOSIS — M1990 Unspecified osteoarthritis, unspecified site: Secondary | ICD-10-CM | POA: Diagnosis not present

## 2021-04-16 DIAGNOSIS — G8929 Other chronic pain: Secondary | ICD-10-CM | POA: Diagnosis not present

## 2021-04-16 DIAGNOSIS — I110 Hypertensive heart disease with heart failure: Secondary | ICD-10-CM | POA: Diagnosis not present

## 2021-04-16 DIAGNOSIS — I5031 Acute diastolic (congestive) heart failure: Secondary | ICD-10-CM | POA: Diagnosis not present

## 2021-04-17 DIAGNOSIS — M955 Acquired deformity of pelvis: Secondary | ICD-10-CM | POA: Diagnosis not present

## 2021-04-17 DIAGNOSIS — M9903 Segmental and somatic dysfunction of lumbar region: Secondary | ICD-10-CM | POA: Diagnosis not present

## 2021-04-17 DIAGNOSIS — M9905 Segmental and somatic dysfunction of pelvic region: Secondary | ICD-10-CM | POA: Diagnosis not present

## 2021-04-17 DIAGNOSIS — M5136 Other intervertebral disc degeneration, lumbar region: Secondary | ICD-10-CM | POA: Diagnosis not present

## 2021-04-18 ENCOUNTER — Other Ambulatory Visit: Payer: Self-pay | Admitting: Internal Medicine

## 2021-04-18 DIAGNOSIS — I110 Hypertensive heart disease with heart failure: Secondary | ICD-10-CM | POA: Diagnosis not present

## 2021-04-18 DIAGNOSIS — M1990 Unspecified osteoarthritis, unspecified site: Secondary | ICD-10-CM | POA: Diagnosis not present

## 2021-04-18 DIAGNOSIS — I4891 Unspecified atrial fibrillation: Secondary | ICD-10-CM | POA: Diagnosis not present

## 2021-04-18 DIAGNOSIS — M48061 Spinal stenosis, lumbar region without neurogenic claudication: Secondary | ICD-10-CM | POA: Diagnosis not present

## 2021-04-18 DIAGNOSIS — G8929 Other chronic pain: Secondary | ICD-10-CM | POA: Diagnosis not present

## 2021-04-18 DIAGNOSIS — I5031 Acute diastolic (congestive) heart failure: Secondary | ICD-10-CM | POA: Diagnosis not present

## 2021-04-18 MED ORDER — METOPROLOL TARTRATE 50 MG PO TABS: 50.0000 mg | ORAL_TABLET | Freq: Two times a day (BID) | ORAL | 0 refills | Status: DC

## 2021-04-18 MED ORDER — LOSARTAN POTASSIUM 50 MG PO TABS: 50.0000 mg | ORAL_TABLET | Freq: Every day | ORAL | 0 refills | Status: DC

## 2021-04-18 MED ORDER — POTASSIUM CHLORIDE CRYS ER 20 MEQ PO TBCR: 20.0000 meq | EXTENDED_RELEASE_TABLET | Freq: Every day | ORAL | 0 refills | Status: DC

## 2021-04-18 MED ORDER — FUROSEMIDE 40 MG PO TABS: 40.0000 mg | ORAL_TABLET | Freq: Every day | ORAL | 0 refills | Status: DC

## 2021-04-18 MED ORDER — RIVAROXABAN 20 MG PO TABS: 20.0000 mg | ORAL_TABLET | Freq: Every day | ORAL | 0 refills | Status: DC

## 2021-04-18 NOTE — Telephone Encounter (Signed)
The patient will not have any more medication after today.

## 2021-04-19 ENCOUNTER — Telehealth: Payer: Self-pay | Admitting: Internal Medicine

## 2021-04-19 NOTE — Telephone Encounter (Signed)
Pt called in regards to medication rivaroxaban. Pt states she was unable to get this medication from pharmacy due to delay.

## 2021-04-19 NOTE — Telephone Encounter (Signed)
Patient was started on xarelto last month for new onset a-fib. Had HFU with cardiology. No note for her to stop medication. She has enough to take until tomorrow. Per chart review, medication was sent in with the rest of her meds 11/16 by our office. Patients daughter was not able to pick up (unsure why, will clarify with pharmacy) but patient was wondering if she should stay on this medication. Has f/u with cardiology in 2 weeks. Just wanted to know if you were ok with refilling or should I reach out to cardiology since they are following her?

## 2021-04-19 NOTE — Telephone Encounter (Signed)
Spoken to patient medication/dosages listed has been updated.  She wants refill of xeralto.

## 2021-04-19 NOTE — Telephone Encounter (Signed)
Need to clarify medications with pt.  I have not prescribed the lasix, xarelto or potassium previously.  It appears a request for refills came through yesterday and was ok'd.  Need to clarify what she is currently taking.    I need to clarify medications she is currently taking, follow up scheduled with me and cardiology, etc.

## 2021-04-20 MED ORDER — RIVAROXABAN 20 MG PO TABS: 20.0000 mg | ORAL_TABLET | Freq: Every day | ORAL | 0 refills | Status: DC

## 2021-04-20 NOTE — Telephone Encounter (Signed)
Rx sent in for xarelto

## 2021-04-20 NOTE — Telephone Encounter (Signed)
Refill sent in for xarelto.  Keep f/u appt with me.

## 2021-04-23 ENCOUNTER — Ambulatory Visit
Admission: RE | Admit: 2021-04-23 | Discharge: 2021-04-23 | Disposition: A | Discharge status: Home / Self Care | Payer: Medicare Other | Source: Ambulatory Visit | Attending: Internal Medicine | Admitting: Internal Medicine

## 2021-04-23 ENCOUNTER — Other Ambulatory Visit: Payer: Self-pay

## 2021-04-23 ENCOUNTER — Ambulatory Visit
Admission: RE | Admit: 2021-04-23 | Discharge: 2021-04-23 | Disposition: A | Discharge status: Home / Self Care | Payer: Medicare Other | Attending: Internal Medicine | Admitting: Internal Medicine

## 2021-04-23 ENCOUNTER — Ambulatory Visit (INDEPENDENT_AMBULATORY_CARE_PROVIDER_SITE_OTHER): Payer: Medicare Other | Admitting: Internal Medicine

## 2021-04-23 ENCOUNTER — Encounter: Payer: Self-pay | Admitting: Internal Medicine

## 2021-04-23 DIAGNOSIS — E7800 Pure hypercholesterolemia, unspecified: Secondary | ICD-10-CM | POA: Diagnosis not present

## 2021-04-23 DIAGNOSIS — R0602 Shortness of breath: Secondary | ICD-10-CM | POA: Diagnosis not present

## 2021-04-23 DIAGNOSIS — R059 Cough, unspecified: Secondary | ICD-10-CM | POA: Insufficient documentation

## 2021-04-23 DIAGNOSIS — M48061 Spinal stenosis, lumbar region without neurogenic claudication: Secondary | ICD-10-CM | POA: Diagnosis not present

## 2021-04-23 DIAGNOSIS — I5030 Unspecified diastolic (congestive) heart failure: Secondary | ICD-10-CM | POA: Diagnosis not present

## 2021-04-23 DIAGNOSIS — I4891 Unspecified atrial fibrillation: Secondary | ICD-10-CM | POA: Diagnosis not present

## 2021-04-23 HISTORY — DX: Cough, unspecified: R05.9

## 2021-04-23 MED ORDER — CEFDINIR 300 MG PO CAPS: 300.0000 mg | ORAL_CAPSULE | Freq: Two times a day (BID) | ORAL | 0 refills | Status: DC

## 2021-04-23 NOTE — Progress Notes (Signed)
Patient ID: Deborah Baxter, female   DOB: February 20, 1934, 87 y.o.   MRN: [id number]   Subjective:    Patient ID: Deborah Baxter, female    DOB: 12-27-1933, 87 y.o.   MRN: [id number]  This visit occurred during the SARS-CoV-2 public health emergency.  Safety protocols were in place, including screening questions prior to the visit, additional usage of staff PPE, and extensive cleaning of exam room while observing appropriate contact time as indicated for disinfecting solutions.   Patient here for hospital follow up.  Chief Complaint  Patient presents with   Hospitalization Follow-up   .   HPI She presented to [facility] with hand pain and sob.  Was found to be in afib (with RVR).  ECHO - Left ventricular ejection fraction, by estimation, is 50 to 55%. The left ventricle has no regional wall motion abnormalities. There is moderate left ventricular hypertrophy. Left ventricular diastolic function could not be evaluated. Right ventricular systolic function is mildly reduced. The right ventricular size is normal. There is normal pulmonary artery systolic pressure. Left atrial size was moderately dilated. Right atrial size was moderately dilated. Moderate pericardial effusion. The pericardial effusion is posterior to the left ventricle. There is no evidence of cardiac tamponade.  The mitral valve is degenerative. Moderate mitral valve regurgitation. Moderate to severe mitral annular calcification. Tricuspid valve is mildly thickened. The tricuspid valve is abnormal. Tricuspid valve regurgitation is moderate. The aortic valve is tricuspid. There is mild thickening of the aortic valve. Aortic valve regurgitation is trivial. Mild aortic valve sclerosis is present, with no evidence of aortic valve stenosis. There is mild dilatation of the ascending aorta, measuring 41 mm.  She was placed on metoprolol, lasix, losartan and xarelto.  Had f/u with cardiology 03/30/21.  Lasix was increased for three days to bid.  She comes  in today accompanied by her daughter Malachy Mood [phone]).  History obtained from both of them.  Daughter reports that she has noticed increased sob - noticed moving slower, etc.  This was occurring prior to her admission.  Ms Mcnicholas reports some days better than others, but overall she feels symptoms are relatively stable.  Does report that starting last week, has noticed increased runny nose, drainage and congestion.  Also increased cough.  Denies any chest congestion.  No chest pain.  Again she feels breathing is relatively stable.  Has been monitoring her weight and blood pressure.  Weight has increased recently.  Since 11/11 - was 163 and over the last few days - 166 pounds - 168 today on our scales.  Eating.  No nausea or vomiting.  No bowel change reported.   Past Medical History:  Diagnosis Date   Hx of degenerative disc disease    Hypercholesterolemia    Neuromuscular disorder ([place])    pinched nerve   Osteoarthritis    Seasonal allergies    Spinal stenosis    chronic back and leg pain   Past Surgical History:  Procedure Laterality Date   APPENDECTOMY     bladder tack surgery  2000   BTL  1976   CARPAL TUNNEL RELEASE     CATARACT EXTRACTION W/PHACO Left 12/07/2015   Procedure: CATARACT EXTRACTION PHACO AND INTRAOCULAR LENS PLACEMENT ([place]);  Surgeon: Eulogio Bear, MD;  Location: [facility] ORS;  Service: Ophthalmology;  Laterality: Left;  [place] 1.23 AP% 23.2 CDE 19.28 Fluid Pack Lot # [id number]   EYE SURGERY  03/19/15   GANGLION CYST EXCISION     right  hand   GAS/FLUID EXCHANGE Left 03/08/2015   Procedure: GAS/FLUID EXCHANGE;  Surgeon: Milus Height, MD;  Location: [facility] ORS;  Service: Ophthalmology;  Laterality: Left;   TONSILLECTOMY     TUBAL LIGATION     VITRECTOMY 25 GAUGE WITH SCLERAL BUCKLE Left 03/08/2015   Procedure: VITRECTOMY 25 GAUGE, membrane peel, 26 %  SF6 gas exchange;  Surgeon: Milus Height, MD;  Location: [facility] ORS;  Service: Ophthalmology;  Laterality: Left;   casette lot # [id number]   Family History  Problem Relation Age of Onset   Diabetes Mother    Heart disease Father        Unknown age of onset. Unknown if had CHF or heart attack.   Leukemia Father    Breast cancer Sister        in her 40's   Cancer Sister        lung cancer   Thyroid cancer Sister    Breast cancer Maternal Aunt    Colon cancer Neg Hx    Social History   Socioeconomic History   Marital status: Widowed    Spouse name: Not on file   Number of children: 5   Years of education: Not on file   Highest education level: Not on file  Occupational History   Not on file  Tobacco Use   Smoking status: Former   Smokeless tobacco: Never  Vaping Use   Vaping Use: Never used  Substance and Sexual Activity   Alcohol use: Yes    Alcohol/week: 0.0 standard drinks    Comment: occassional   Drug use: No   Sexual activity: Not on file  Other Topics Concern   Not on file  Social History Narrative   Not on file   Social Determinants of Health   Financial Resource Strain: Low Risk    Difficulty of Paying Living Expenses: Not hard at all  Food Insecurity: No Food Insecurity   Worried About Charity fundraiser in the Last Year: Never true   [place] in the Last Year: Never true  Transportation Needs: No Transportation Needs   Lack of Transportation (Medical): No   Lack of Transportation (Non-Medical): No  Physical Activity: Insufficiently Active   Days of Exercise per Week: 3 days   Minutes of Exercise per Session: 20 min  Stress: No Stress Concern Present   Feeling of Stress : Not at all  Social Connections: Unknown   Frequency of Communication with Friends and Family: More than three times a week   Frequency of Social Gatherings with Friends and Family: More than three times a week   Attends Religious Services: Not on Electrical engineer or Organizations: Not on file   Attends Archivist Meetings: Not on file   Marital Status: Not on  file     Review of Systems  Constitutional:  Negative for appetite change and unexpected weight change.  HENT:  Positive for congestion, postnasal drip and sinus pressure.   Respiratory:  Positive for cough and shortness of breath. Negative for chest tightness.   Cardiovascular:  Negative for chest pain.       Some leg swelling, but daughter reports is improved.  Denies increased heart rate or palpitations.   Gastrointestinal:  Negative for abdominal pain, diarrhea, nausea and vomiting.  Genitourinary:  Negative for difficulty urinating and dysuria.  Musculoskeletal:  Negative for joint swelling and myalgias.  Skin:  Negative for color change and rash.  Neurological:  Negative for dizziness, light-headedness and headaches.  Psychiatric/Behavioral:  Negative for agitation and dysphoric mood.       Objective:     BP 138/86   Pulse (!) 56   Temp (!) 95.3 F (35.2 C)   Ht 5' 7.01" (1.702 m)   Wt 168 lb 3.2 oz (76.3 kg)   LMP 06/26/1985   SpO2 92%   BMI 26.34 kg/m  Wt Readings from Last 3 Encounters:  04/23/21 168 lb 3.2 oz (76.3 kg)  03/30/21 168 lb (76.2 kg)  03/20/21 165 lb (74.8 kg)    Physical Exam Vitals reviewed.  Constitutional:      General: She is not in acute distress.    Appearance: Normal appearance.  HENT:     Head: Normocephalic and atraumatic.     Right Ear: External ear normal.     Left Ear: External ear normal.  Eyes:     General: No scleral icterus.       Right eye: No discharge.        Left eye: No discharge.     Conjunctiva/sclera: Conjunctivae normal.  Neck:     Thyroid: No thyromegaly.  Cardiovascular:     Comments: Irregular rhythm.   Pulmonary:     Effort: No respiratory distress.     Breath sounds: No wheezing.  Abdominal:     General: Bowel sounds are normal.     Palpations: Abdomen is soft.     Tenderness: There is no abdominal tenderness.  Musculoskeletal:        General: No tenderness.     Cervical back: Neck supple. No  tenderness.     Comments: Minimal increased swelling - pedal/ankles.   Lymphadenopathy:     Cervical: No cervical adenopathy.  Skin:    Findings: No erythema or rash.  Neurological:     Mental Status: She is alert.  Psychiatric:        Mood and Affect: Mood normal.        Behavior: Behavior normal.     Outpatient Encounter Medications as of 04/23/2021  Medication Sig   acetaminophen (TYLENOL) 650 MG CR tablet Take 650 mg by mouth every 8 (eight) hours as needed for pain. Reported on 08/02/2015   cefdinir (OMNICEF) 300 MG capsule Take 1 capsule (300 mg total) by mouth 2 (two) times daily.   furosemide (LASIX) 40 MG tablet Take 1 tablet (40 mg total) by mouth daily.   losartan (COZAAR) 50 MG tablet Take 1 tablet (50 mg total) by mouth daily.   metoprolol tartrate (LOPRESSOR) 50 MG tablet Take 1 tablet (50 mg total) by mouth 2 (two) times daily.   Multiple Vitamin (MULTIVITAMIN) tablet Take 1 tablet by mouth daily.   OVER THE COUNTER MEDICATION Place 1 drop into both eyes every 8 (eight) hours as needed (dry eyes.). Lubricant eye drops   Polyethylene Glycol 3350 (MIRALAX PO) Take 17 g by mouth every 3 (three) days. Every 3 days   potassium chloride SA (KLOR-CON) 20 MEQ tablet Take 1 tablet (20 mEq total) by mouth daily for 30 doses. Take this medication while taking lasix   rivaroxaban (XARELTO) 20 MG TABS tablet Take 1 tablet (20 mg total) by mouth daily with supper.   No facility-administered encounter medications on file as of 04/23/2021.     Lab Results  Component Value Date   WBC 6.3 03/30/2021   HGB 13.5 03/30/2021   HCT 40.5 03/30/2021   PLT 174 03/30/2021   GLUCOSE 105 (H) 03/30/2021  CHOL 196 01/29/2021   TRIG 85.0 01/29/2021   HDL 67.70 01/29/2021   LDLDIRECT 104.0 08/12/2017   LDLCALC 111 (H) 01/29/2021   ALT 40 03/20/2021   AST 39 03/20/2021   NA 136 03/30/2021   K 4.7 03/30/2021   CL 101 03/30/2021   CREATININE 0.64 03/30/2021   BUN 22 03/30/2021   CO2 30  03/30/2021   TSH 3.046 03/20/2021   INR 1.2 03/20/2021       Assessment & Plan:   Problem List Items Addressed This Visit     (HFpEF) heart failure with preserved ejection fraction ([place])    Recently admitted with afib RVR.  CXR on admission revealed small bilateral pleural effusions with constellation of findings  favored to reflect pulmonary edema with bibasilar atelectasis.  Was placed on losartan and lasix.  She reports breathing overall is stable.  Some days better than others.  Some sob with walking to mailbox, etc.  Weight as outlined.  Increase lasix to bid (with bid potassium).  Continue losartan.  Scheduled for f/u echo next week.        A-fib [facility])    Recently admitted with afib - RVR.  ECHO as outlined.  Placed on losartan, lasix and metoprolol.  Remains in afib.  Weight increased as outlined.  Saw cardiology.  Planning for f/u echo next week.  Breathing overall stable (per pt).  Per daughter, still limited in activity, slow moving, etc.  Reviewed today's weight and outside weight.  Increase lasix to bid for three days.  Will increase potassium to bid for three days as well.  Treat current infection.  Confirm f/u with cardiology.       Atrial fibrillation with rapid ventricular response (HCC)   Cough    Cough and congestion as outlined.  Symptoms started one week ago.  Family has been sick as well.  Home covid test negative.  Will inform - saline nasal spray and nasacort nasal spray as directed.  Robitussin DM.  omnicef as directed.  Recheck cxr.  Increase lasix.  Follow closely.  Given symptoms, will swab for RSV, FLU and covid.        Relevant Orders   DG Chest 2 View (Completed)   COVID-19, Flu A+B and RSV   Hypercholesterolemia    Low cholesterol diet and exercise.  Have discussed calculated cholesterol risk and recommendation to start cholesterol medication.  She declines.  Follow lipid panel.        Lumbar spinal stenosis    Has known spinal stenosis.  Has seen Dr  Sharlet [place].  S/p injections.  Stable.          Einar Pheasant, MD

## 2021-04-23 NOTE — Patient Instructions (Signed)
Increase lasix to twice a day for three days  Increase potassium to twice a day for three days  I have sent in an antibiotic.    Take a probiotic daily while you are on antibiotics and for two days after completing antibiotics.    Robitussin DM twice a day as needed for cough and congestion.

## 2021-04-24 ENCOUNTER — Telehealth: Payer: Self-pay

## 2021-04-24 ENCOUNTER — Encounter: Payer: Self-pay | Admitting: Internal Medicine

## 2021-04-24 DIAGNOSIS — Z87891 Personal history of nicotine dependence: Secondary | ICD-10-CM | POA: Diagnosis not present

## 2021-04-24 DIAGNOSIS — Z7901 Long term (current) use of anticoagulants: Secondary | ICD-10-CM | POA: Diagnosis not present

## 2021-04-24 DIAGNOSIS — Z9181 History of falling: Secondary | ICD-10-CM | POA: Diagnosis not present

## 2021-04-24 DIAGNOSIS — I4891 Unspecified atrial fibrillation: Secondary | ICD-10-CM | POA: Insufficient documentation

## 2021-04-24 DIAGNOSIS — I110 Hypertensive heart disease with heart failure: Secondary | ICD-10-CM | POA: Diagnosis not present

## 2021-04-24 DIAGNOSIS — G8929 Other chronic pain: Secondary | ICD-10-CM | POA: Diagnosis not present

## 2021-04-24 DIAGNOSIS — M1990 Unspecified osteoarthritis, unspecified site: Secondary | ICD-10-CM | POA: Diagnosis not present

## 2021-04-24 DIAGNOSIS — I5031 Acute diastolic (congestive) heart failure: Secondary | ICD-10-CM | POA: Diagnosis not present

## 2021-04-24 DIAGNOSIS — I5030 Unspecified diastolic (congestive) heart failure: Secondary | ICD-10-CM | POA: Insufficient documentation

## 2021-04-24 DIAGNOSIS — E7800 Pure hypercholesterolemia, unspecified: Secondary | ICD-10-CM | POA: Diagnosis not present

## 2021-04-24 DIAGNOSIS — M48061 Spinal stenosis, lumbar region without neurogenic claudication: Secondary | ICD-10-CM | POA: Diagnosis not present

## 2021-04-24 DIAGNOSIS — K5790 Diverticulosis of intestine, part unspecified, without perforation or abscess without bleeding: Secondary | ICD-10-CM | POA: Diagnosis not present

## 2021-04-24 HISTORY — DX: Unspecified atrial fibrillation: I48.91

## 2021-04-24 HISTORY — DX: Unspecified diastolic (congestive) heart failure: I50.30

## 2021-04-24 LAB — COVID-19, FLU A+B AND RSV
Influenza A, NAA: NOT DETECTED
Influenza B, NAA: NOT DETECTED
RSV, NAA: NOT DETECTED
SARS-CoV-2, NAA: NOT DETECTED

## 2021-04-24 NOTE — Assessment & Plan Note (Signed)
Low cholesterol diet and exercise.  Have discussed calculated cholesterol risk and recommendation to start cholesterol medication.  She declines.  Follow lipid panel.   

## 2021-04-24 NOTE — Telephone Encounter (Signed)
LM to give swab and xray results

## 2021-04-24 NOTE — Assessment & Plan Note (Signed)
Recently admitted with afib - RVR.  ECHO as outlined.  Placed on losartan, lasix and metoprolol.  Remains in afib.  Weight increased as outlined.  Saw cardiology.  Planning for f/u echo next week.  Breathing overall stable (per pt).  Per daughter, still limited in activity, slow moving, etc.  Reviewed today's weight and outside weight.  Increase lasix to bid for three days.  Will increase potassium to bid for three days as well.  Treat current infection.  Confirm f/u with cardiology.

## 2021-04-24 NOTE — Assessment & Plan Note (Signed)
Cough and congestion as outlined.  Symptoms started one week ago.  Family has been sick as well.  Home covid test negative.  Will inform - saline nasal spray and nasacort nasal spray as directed.  Robitussin DM.  omnicef as directed.  Recheck cxr.  Increase lasix.  Follow closely.  Given symptoms, will swab for RSV, FLU and covid.

## 2021-04-24 NOTE — Assessment & Plan Note (Signed)
Has known spinal stenosis.  Has seen Dr Chasnis.  S/p injections.  Stable.   

## 2021-04-24 NOTE — Assessment & Plan Note (Signed)
Recently admitted with afib RVR.  CXR on admission revealed small bilateral pleural effusions with constellation of findings  favored to reflect pulmonary edema with bibasilar atelectasis.  Was placed on losartan and lasix.  She reports breathing overall is stable.  Some days better than others.  Some sob with walking to mailbox, etc.  Weight as outlined.  Increase lasix to bid (with bid potassium).  Continue losartan.  Scheduled for f/u echo next week.

## 2021-04-27 NOTE — Progress Notes (Signed)
Cardiology Office Note    Date:  05/04/2021   ID:  Deborah Baxter, DOB [birth year], MRN [id number]  PCP:  Einar Pheasant, MD  Cardiologist:  Nelva Bush, MD  Electrophysiologist:  None   Chief Complaint: Follow-up  History of Present Illness:   Deborah Baxter is a 87 y.o. female with history of A. fib of uncertain chronicity though at least persistent, HFpEF, possible infiltrative cardiomyopathy, pericardial effusion, syncope, HTN, HLD, osteoarthritis, prior tobacco use, and spinal stenosis who presents for follow-up of A. fib and HFpEF.  Prior ED evaluation for syncope in 11/2020 with negative orthostatics and largely unrevealing labs.  Cardiac enzymes not cycled.  EKG unavailable for review.  She was seen at a local urgent care on 10/18 for hand pain and was found to be in A. fib of uncertain chronicity.  At that time she had reported exertional dyspnea over the preceding several weeks.  Given A. fib, it was recommended she be evaluated in the ER.  She was admitted to [facility] from 8/18 through 8/20 with acute HFpEF, A. fib with RVR of uncertain chronicity, and moderate pericardial effusion.  EKG demonstrated A. fib with RVR, 121 bpm, low voltage QRS, and nonspecific ST-T changes.  High-sensitivity troponin of 28 with a delta of 17.  BNP 335.  Echo demonstrated a low normal LV systolic function with an EF of 50 to 55%, no regional wall motion abnormalities, moderate LVH, LV diastolic function parameters could not be evaluated, mildly reduced RV systolic function with normal ventricular cavity size, normal PASP, moderate biatrial enlargement, moderate pericardial effusion that was posterior to the LV without evidence of cardiac tamponade, degenerative mitral valve with moderate regurgitation and moderate to severe mitral annular calcification, moderate tricuspid regurgitation, tricuspid aortic valve with trivial insufficiency and mild aortic valve sclerosis without evidence of stenosis, mildly  dilated ascending aorta measuring 41 mm, and an estimated right atrial pressure of 8 mmHg.  Given moderate LVH, biatrial enlargement, and low voltage on EKG, infiltrative cardiomyopathy was recommended to be considered.  She responded well to IV diuresis.  With regards to her A. fib, she was rate controlled with beta-blocker and initiated on Xarelto.  She was seen in hospital follow-up on 03/30/2021 and was doing reasonably well without tachypalpitations and a noted improvement in her energy.  She still noted some exertional shortness of breath.  She reported adherence to medications and was without symptoms of bleeding.  She remained in A. fib with a ventricular rate of 94 bpm on EKG.  She was felt to be still mildly volume up with recommendation to increase furosemide to 80 mg for 3 days followed by 40 mg daily there after along with increasing KCl to 40 mEq for 3 days followed by 20 mill equivalent daily thereafter.  Subsequent repeat limited echo on 05/02/2021 demonstrated an EF of 50%, moderate LVH, normal RV systolic function and ventricular cavity size, stable moderate pericardial effusion that was posterior and lateral to the left ventricle without evidence of cardiac tamponade, moderate pleural effusion in the left lateral region, mild mitral regurgitation, trivial aortic insufficiency, mildly dilated ascending aorta measuring 41 mm, and an estimated right atrial pressure of 8 mmHg.  She was evaluated by her PCP in late 04/2021 with URI-like symptoms.  Lasix was briefly increased to 40 mg twice daily for 3 days per family.  Chest x-ray showed chronic scarring within the left lung base and no new focal abnormality.  She comes in accompanied by her daughter  today.  She reports her breathing is largely stable, though does continue to note exertional dyspnea.  She attributes this to some of her deconditioning.  Her URI symptoms are improved.  No chest pain or palpitations.  In reviewing her weights, these  have trended from 163 pounds to 169 pounds, with more recent weights typically around the 169 pound range.  No significant lower extremity swelling, abdominal distention, orthopnea, or PND.  She does not report early satiety, though does note her appetite is not as good as it previously was.  Currently, she remains on furosemide 40 mg daily.  With regards to her A. fib, again she has not noted any palpitations.  She remains on metoprolol for rate control and rivaroxaban for anticoagulation.  She has not missed any doses of anticoagulation and has been anticoagulated for greater than 4 weeks.  No falls, melena, or hematochezia.   Labs independently reviewed: 03/2021 - Hgb 13.5, PLT 174, potassium 4.7, BUN 22, serum creatinine 0.64, TSH normal, albumin 3.7, AST/ALT normal 01/2021 - TC 196, TG 85, HDL 67, LDL 111 11/2020 - magnesium 1.8  Past Medical History:  Diagnosis Date   Hx of degenerative disc disease    Hypercholesterolemia    Neuromuscular disorder (HCC)    pinched nerve   Osteoarthritis    Seasonal allergies    Spinal stenosis    chronic back and leg pain    Past Surgical History:  Procedure Laterality Date   APPENDECTOMY     bladder tack surgery  2000   BTL  1976   CARPAL TUNNEL RELEASE     CATARACT EXTRACTION W/PHACO Left 12/07/2015   Procedure: CATARACT EXTRACTION PHACO AND INTRAOCULAR LENS PLACEMENT ([place]);  Surgeon: Eulogio Bear, MD;  Location: [facility] ORS;  Service: Ophthalmology;  Laterality: Left;  [place] 1.23 AP% 23.2 CDE 19.28 Fluid Pack Lot # [id number]   EYE SURGERY  03/19/15   GANGLION CYST EXCISION     right hand   GAS/FLUID EXCHANGE Left 03/08/2015   Procedure: GAS/FLUID EXCHANGE;  Surgeon: Milus Height, MD;  Location: [facility] ORS;  Service: Ophthalmology;  Laterality: Left;   TONSILLECTOMY     TUBAL LIGATION     VITRECTOMY 25 GAUGE WITH SCLERAL BUCKLE Left 03/08/2015   Procedure: VITRECTOMY 25 GAUGE, membrane peel, 26 %  SF6 gas exchange;  Surgeon: Milus Height, MD;  Location: [facility] ORS;  Service: Ophthalmology;  Laterality: Left;  casette lot # [id number]    Current Medications: Current Meds  Medication Sig   acetaminophen (TYLENOL) 650 MG CR tablet Take 650 mg by mouth every 8 (eight) hours as needed for pain. Reported on 08/02/2015   cefdinir (OMNICEF) 300 MG capsule Take 1 capsule (300 mg total) by mouth 2 (two) times daily.   furosemide (LASIX) 40 MG tablet Take 1 tablet (40 mg total) by mouth daily.   losartan (COZAAR) 50 MG tablet Take 1 tablet (50 mg total) by mouth daily.   metoprolol tartrate (LOPRESSOR) 50 MG tablet Take 1 tablet (50 mg total) by mouth 2 (two) times daily.   Multiple Vitamin (MULTIVITAMIN) tablet Take 1 tablet by mouth daily.   OVER THE COUNTER MEDICATION Place 1 drop into both eyes every 8 (eight) hours as needed (dry eyes.). Lubricant eye drops   Polyethylene Glycol 3350 (MIRALAX PO) Take 17 g by mouth every 3 (three) days. Every 3 days   potassium chloride SA (KLOR-CON M) 20 MEQ tablet Take 1 tablet (20 mEq total) by mouth daily for 30 doses.  Take this medication while taking lasix   rivaroxaban (XARELTO) 20 MG TABS tablet Take 1 tablet (20 mg total) by mouth daily with supper.    Allergies:   Quinolones   Social History   Socioeconomic History   Marital status: Widowed    Spouse name: Not on file   Number of children: 5   Years of education: Not on file   Highest education level: Not on file  Occupational History   Not on file  Tobacco Use   Smoking status: Former   Smokeless tobacco: Never  Vaping Use   Vaping Use: Never used  Substance and Sexual Activity   Alcohol use: Yes    Alcohol/week: 0.0 standard drinks    Comment: occassional   Drug use: No   Sexual activity: Not on file  Other Topics Concern   Not on file  Social History Narrative   Not on file   Social Determinants of Health   Financial Resource Strain: Low Risk    Difficulty of Paying Living Expenses: Not hard at all  Food  Insecurity: No Food Insecurity   Worried About Charity fundraiser in the Last Year: Never true   [place] in the Last Year: Never true  Transportation Needs: No Transportation Needs   Lack of Transportation (Medical): No   Lack of Transportation (Non-Medical): No  Physical Activity: Insufficiently Active   Days of Exercise per Week: 3 days   Minutes of Exercise per Session: 20 min  Stress: No Stress Concern Present   Feeling of Stress : Not at all  Social Connections: Unknown   Frequency of Communication with Friends and Family: More than three times a week   Frequency of Social Gatherings with Friends and Family: More than three times a week   Attends Religious Services: Not on Electrical engineer or Organizations: Not on file   Attends Archivist Meetings: Not on file   Marital Status: Not on file     Family History:  The patient's family history includes Breast cancer in her maternal aunt and sister; Cancer in her sister; Diabetes in her mother; Heart disease in her father; Leukemia in her father; Thyroid cancer in her sister. There is no history of Colon cancer.  ROS:   Review of Systems  Constitutional:  Positive for malaise/fatigue. Negative for chills, diaphoresis, fever and weight loss.  HENT:  Negative for congestion.   Eyes:  Negative for discharge and redness.  Respiratory:  Positive for cough and shortness of breath. Negative for sputum production and wheezing.   Cardiovascular:  Negative for chest pain, palpitations, orthopnea, claudication, leg swelling and PND.  Gastrointestinal:  Negative for abdominal pain, blood in stool, heartburn, melena, nausea and vomiting.  Musculoskeletal:  Negative for falls and myalgias.  Skin:  Negative for rash.  Neurological:  Positive for weakness. Negative for dizziness, tingling, tremors, sensory change, speech change, focal weakness and loss of consciousness.  Endo/Heme/Allergies:  Does not bruise/bleed  easily.  Psychiatric/Behavioral:  Negative for substance abuse. The patient is not nervous/anxious.   All other systems reviewed and are negative.   EKGs/Labs/Other Studies Reviewed:    Studies reviewed were summarized above. The additional studies were reviewed today:  2D echo 03/20/2021: 1. Left ventricular ejection fraction, by estimation, is 50 to 55%. The  left ventricle has low normal function. The left ventricle has no regional  wall motion abnormalities. There is moderate left ventricular hypertrophy.  Left ventricular  diastolic  function could not be evaluated.   2. Right ventricular systolic function is mildly reduced. The right  ventricular size is normal. There is normal pulmonary artery systolic  pressure.   3. Left atrial size was moderately dilated.   4. Right atrial size was moderately dilated.   5. Moderate pericardial effusion. The pericardial effusion is posterior  to the left ventricle. There is no evidence of cardiac tamponade.   6. The mitral valve is degenerative. Moderate mitral valve regurgitation.  Moderate to severe mitral annular calcification.   7. Tricuspid valve is mildly thickened. The tricuspid valve is abnormal.  Tricuspid valve regurgitation is moderate.   8. The aortic valve is tricuspid. There is mild thickening of the aortic  valve. Aortic valve regurgitation is trivial. Mild aortic valve sclerosis  is present, with no evidence of aortic valve stenosis.   9. There is mild dilatation of the ascending aorta, measuring 41 mm.  10. The inferior vena cava is normal in size with <50% respiratory  variability, suggesting right atrial pressure of 8 mmHg.   Conclusion(s)/Recommendation(s): Given moderate LVH, biatrial enlargement,  and low voltage on EKG, infiltrative cardiomyopathy, including  amyloidosis, should be considered.     EKG:  EKG is ordered today.  The EKG ordered today demonstrates A. fib, 89 bpm, low voltage QRS, left anterior  fascicular block, nonspecific ST-T changes  Recent Labs: 03/20/2021: ALT 40; B Natriuretic Peptide 335.6; TSH 3.046 03/30/2021: BUN 22; Creatinine, Ser 0.64; Hemoglobin 13.5; Platelets 174; Potassium 4.7; Sodium 136  Recent Lipid Panel    Component Value Date/Time   CHOL 196 01/29/2021 1117   TRIG 85.0 01/29/2021 1117   HDL 67.70 01/29/2021 1117   CHOLHDL 3 01/29/2021 1117   VLDL 17.0 01/29/2021 1117   LDLCALC 111 (H) 01/29/2021 1117   LDLDIRECT 104.0 08/12/2017 0932    PHYSICAL EXAM:    VS:  BP (!) 148/100 (BP Location: Left Arm, Patient Position: Sitting, Cuff Size: Normal)   Pulse 89   Ht 5' 7.5" (1.715 m)   Wt 170 lb 4 oz (77.2 kg)   LMP 06/26/1985   SpO2 97%   BMI 26.27 kg/m   BMI: Body mass index is 26.27 kg/m.  Physical Exam Constitutional:      Appearance: She is well-developed.  HENT:     Head: Normocephalic and atraumatic.  Eyes:     General:        Right eye: No discharge.        Left eye: No discharge.  Neck:     Vascular: No JVD.  Cardiovascular:     Rate and Rhythm: Normal rate. Rhythm irregularly irregular.     Pulses:          Posterior tibial pulses are 2+ on the right side and 2+ on the left side.     Heart sounds: Normal heart sounds, S1 normal and S2 normal. Heart sounds not distant. No midsystolic click and no opening snap. No murmur heard.   No friction rub.  Pulmonary:     Effort: Pulmonary effort is normal. No respiratory distress.     Breath sounds: Examination of the left-lower field reveals decreased breath sounds. Decreased breath sounds present. No wheezing or rales.  Chest:     Chest wall: No tenderness.  Abdominal:     General: There is no distension.     Palpations: Abdomen is soft.     Tenderness: There is no abdominal tenderness.  Musculoskeletal:     Cervical  back: Normal range of motion.     Right lower leg: Edema present.     Left lower leg: Edema present.     Comments: Trace bilateral nonpitting lower extremity edema  with compression stockings in place  Skin:    General: Skin is warm and dry.     Nails: There is no clubbing.  Neurological:     Mental Status: She is alert and oriented to person, place, and time.  Psychiatric:        Speech: Speech normal.        Behavior: Behavior normal.        Thought Content: Thought content normal.        Judgment: Judgment normal.    Wt Readings from Last 3 Encounters:  05/04/21 170 lb 4 oz (77.2 kg)  04/23/21 168 lb 3.2 oz (76.3 kg)  03/30/21 168 lb (76.2 kg)     ASSESSMENT & PLAN:   Acute on chronic HFpEF with possible infiltrative cardiomyopathy: She continues to appear volume up with an uptrending weight by home scale and a weight that is up 2 pounds today by our scale when compared to her last visit.  Also, recent echo continued to demonstrate a moderate pericardial effusion without evidence of cardiac tamponade as well as a new left pleural effusion.  Increase furosemide to 80 mg daily along with KCl 40 mEq daily for 5 days followed by 40 mEq of furosemide and 20 mEq of KCl daily thereafter.  Check CBC and BMP today and again in follow-up.  We also briefly touched on the prior recommendation to consider cardiac MRI for further evaluation of infiltrative cardiomyopathy.  This will be revisited in follow-up.  Persistent A. fib: She remains in A. fib with controlled ventricular response with Lopressor.  She has not missed any doses of anticoagulation.  Given a CHA2DS2-VASc of at least  5 (CHF, HTN, age x2, sex category), she remains on Xarelto.  No symptoms concerning for bleeding.  Check BMP and CBC.  Schedule DCCV.  Moderate pericardial effusion: No evidence of cardiac tamponade physiology on echo obtained on 05/02/2021.  Hemodynamically stable in the office today.  Increase Lasix as outlined above.  We will look to repeat a limited echo in 1 month.  Left pleural effusion: Increase Lasix as outlined above.  Aortic insufficiency/mitral regurgitation: Monitor  with periodic echo.  HTN: Blood pressure is mildly elevated in the office today.  She remains on metoprolol and losartan with recommendation to escalate diuresis as outlined above.  Mildly dilated ascending aorta: Monitor with periodic echo.  Defer cross-sectional imaging at this time given advanced age.  Optimal blood pressure control recommended.  This was not discussed in detail at today's visit.   Shared Decision Making/Informed Consent{  The risks (stroke, cardiac arrhythmias rarely resulting in the need for a temporary or permanent pacemaker, skin irritation or burns and complications associated with conscious sedation including aspiration, arrhythmia, respiratory failure and death), benefits (restoration of normal sinus rhythm) and alternatives of a direct current cardioversion were explained in detail to Deborah Baxter and she agrees to proceed.       Disposition: F/u with Dr. Saunders Revel or an APP in 2 weeks.   Medication Adjustments/Labs and Tests Ordered: Current medicines are reviewed at length with the patient today.  Concerns regarding medicines are outlined above. Medication changes, Labs and Tests ordered today are summarized above and listed in the Patient Instructions accessible in Encounters.   Signed, Christell Faith, PA-C 05/04/2021 5:02 PM  [address] [facility] [place], [place] [phone]

## 2021-04-27 NOTE — H&P (View-Only) (Signed)
Cardiology Office Note    Date:  05/04/2021   ID:  Deborah Baxter, DOB 26-May-1934, MRN [id number]  PCP:  Einar Pheasant, MD  Cardiologist:  Nelva Bush, MD  Electrophysiologist:  None   Chief Complaint: Follow-up  History of Present Illness:   Deborah Baxter is a 87 y.o. female with history of A. fib of uncertain chronicity though at least persistent, HFpEF, possible infiltrative cardiomyopathy, pericardial effusion, syncope, HTN, HLD, osteoarthritis, prior tobacco use, and spinal stenosis who presents for follow-up of A. fib and HFpEF.  Prior ED evaluation for syncope in 11/2020 with negative orthostatics and largely unrevealing labs.  Cardiac enzymes not cycled.  EKG unavailable for review.  She was seen at a local urgent care on 10/18 for hand pain and was found to be in A. fib of uncertain chronicity.  At that time she had reported exertional dyspnea over the preceding several weeks.  Given A. fib, it was recommended she be evaluated in the ER.  She was admitted to [facility] from 8/18 through 8/20 with acute HFpEF, A. fib with RVR of uncertain chronicity, and moderate pericardial effusion.  EKG demonstrated A. fib with RVR, 121 bpm, low voltage QRS, and nonspecific ST-T changes.  High-sensitivity troponin of 28 with a delta of 17.  BNP 335.  Echo demonstrated a low normal LV systolic function with an EF of 50 to 55%, no regional wall motion abnormalities, moderate LVH, LV diastolic function parameters could not be evaluated, mildly reduced RV systolic function with normal ventricular cavity size, normal PASP, moderate biatrial enlargement, moderate pericardial effusion that was posterior to the LV without evidence of cardiac tamponade, degenerative mitral valve with moderate regurgitation and moderate to severe mitral annular calcification, moderate tricuspid regurgitation, tricuspid aortic valve with trivial insufficiency and mild aortic valve sclerosis without evidence of stenosis, mildly  dilated ascending aorta measuring 41 mm, and an estimated right atrial pressure of 8 mmHg.  Given moderate LVH, biatrial enlargement, and low voltage on EKG, infiltrative cardiomyopathy was recommended to be considered.  She responded well to IV diuresis.  With regards to her A. fib, she was rate controlled with beta-blocker and initiated on Xarelto.  She was seen in hospital follow-up on 03/30/2021 and was doing reasonably well without tachypalpitations and a noted improvement in her energy.  She still noted some exertional shortness of breath.  She reported adherence to medications and was without symptoms of bleeding.  She remained in A. fib with a ventricular rate of 94 bpm on EKG.  She was felt to be still mildly volume up with recommendation to increase furosemide to 80 mg for 3 days followed by 40 mg daily there after along with increasing KCl to 40 mEq for 3 days followed by 20 mill equivalent daily thereafter.  Subsequent repeat limited echo on 05/02/2021 demonstrated an EF of 50%, moderate LVH, normal RV systolic function and ventricular cavity size, stable moderate pericardial effusion that was posterior and lateral to the left ventricle without evidence of cardiac tamponade, moderate pleural effusion in the left lateral region, mild mitral regurgitation, trivial aortic insufficiency, mildly dilated ascending aorta measuring 41 mm, and an estimated right atrial pressure of 8 mmHg.  She was evaluated by her PCP in late 04/2021 with URI-like symptoms.  Lasix was briefly increased to 40 mg twice daily for 3 days per family.  Chest x-ray showed chronic scarring within the left lung base and no new focal abnormality.  She comes in accompanied by her daughter  today.  She reports her breathing is largely stable, though does continue to note exertional dyspnea.  She attributes this to some of her deconditioning.  Her URI symptoms are improved.  No chest pain or palpitations.  In reviewing her weights, these  have trended from 163 pounds to 169 pounds, with more recent weights typically around the 169 pound range.  No significant lower extremity swelling, abdominal distention, orthopnea, or PND.  She does not report early satiety, though does note her appetite is not as good as it previously was.  Currently, she remains on furosemide 40 mg daily.  With regards to her A. fib, again she has not noted any palpitations.  She remains on metoprolol for rate control and rivaroxaban for anticoagulation.  She has not missed any doses of anticoagulation and has been anticoagulated for greater than 4 weeks.  No falls, melena, or hematochezia.   Labs independently reviewed: 03/2021 - Hgb 13.5, PLT 174, potassium 4.7, BUN 22, serum creatinine 0.64, TSH normal, albumin 3.7, AST/ALT normal 01/2021 - TC 196, TG 85, HDL 67, LDL 111 11/2020 - magnesium 1.8  Past Medical History:  Diagnosis Date   Hx of degenerative disc disease    Hypercholesterolemia    Neuromuscular disorder (HCC)    pinched nerve   Osteoarthritis    Seasonal allergies    Spinal stenosis    chronic back and leg pain    Past Surgical History:  Procedure Laterality Date   APPENDECTOMY     bladder tack surgery  2000   BTL  1976   CARPAL TUNNEL RELEASE     CATARACT EXTRACTION W/PHACO Left 12/07/2015   Procedure: CATARACT EXTRACTION PHACO AND INTRAOCULAR LENS PLACEMENT ([place]);  Surgeon: Eulogio Bear, MD;  Location: [facility] ORS;  Service: Ophthalmology;  Laterality: Left;  [place] 1.23 AP% 23.2 CDE 19.28 Fluid Pack Lot # [id number]   EYE SURGERY  03/19/15   GANGLION CYST EXCISION     right hand   GAS/FLUID EXCHANGE Left 03/08/2015   Procedure: GAS/FLUID EXCHANGE;  Surgeon: Milus Height, MD;  Location: [facility] ORS;  Service: Ophthalmology;  Laterality: Left;   TONSILLECTOMY     TUBAL LIGATION     VITRECTOMY 25 GAUGE WITH SCLERAL BUCKLE Left 03/08/2015   Procedure: VITRECTOMY 25 GAUGE, membrane peel, 26 %  SF6 gas exchange;  Surgeon: Milus Height, MD;  Location: [facility] ORS;  Service: Ophthalmology;  Laterality: Left;  casette lot # [id number]    Current Medications: Current Meds  Medication Sig   acetaminophen (TYLENOL) 650 MG CR tablet Take 650 mg by mouth every 8 (eight) hours as needed for pain. Reported on 08/02/2015   cefdinir (OMNICEF) 300 MG capsule Take 1 capsule (300 mg total) by mouth 2 (two) times daily.   furosemide (LASIX) 40 MG tablet Take 1 tablet (40 mg total) by mouth daily.   losartan (COZAAR) 50 MG tablet Take 1 tablet (50 mg total) by mouth daily.   metoprolol tartrate (LOPRESSOR) 50 MG tablet Take 1 tablet (50 mg total) by mouth 2 (two) times daily.   Multiple Vitamin (MULTIVITAMIN) tablet Take 1 tablet by mouth daily.   OVER THE COUNTER MEDICATION Place 1 drop into both eyes every 8 (eight) hours as needed (dry eyes.). Lubricant eye drops   Polyethylene Glycol 3350 (MIRALAX PO) Take 17 g by mouth every 3 (three) days. Every 3 days   potassium chloride SA (KLOR-CON M) 20 MEQ tablet Take 1 tablet (20 mEq total) by mouth daily for 30 doses.  Take this medication while taking lasix   rivaroxaban (XARELTO) 20 MG TABS tablet Take 1 tablet (20 mg total) by mouth daily with supper.    Allergies:   Quinolones   Social History   Socioeconomic History   Marital status: Widowed    Spouse name: Not on file   Number of children: 5   Years of education: Not on file   Highest education level: Not on file  Occupational History   Not on file  Tobacco Use   Smoking status: Former   Smokeless tobacco: Never  Vaping Use   Vaping Use: Never used  Substance and Sexual Activity   Alcohol use: Yes    Alcohol/week: 0.0 standard drinks    Comment: occassional   Drug use: No   Sexual activity: Not on file  Other Topics Concern   Not on file  Social History Narrative   Not on file   Social Determinants of Health   Financial Resource Strain: Low Risk    Difficulty of Paying Living Expenses: Not hard at all  Food  Insecurity: No Food Insecurity   Worried About Charity fundraiser in the Last Year: Never true   [place] in the Last Year: Never true  Transportation Needs: No Transportation Needs   Lack of Transportation (Medical): No   Lack of Transportation (Non-Medical): No  Physical Activity: Insufficiently Active   Days of Exercise per Week: 3 days   Minutes of Exercise per Session: 20 min  Stress: No Stress Concern Present   Feeling of Stress : Not at all  Social Connections: Unknown   Frequency of Communication with Friends and Family: More than three times a week   Frequency of Social Gatherings with Friends and Family: More than three times a week   Attends Religious Services: Not on Electrical engineer or Organizations: Not on file   Attends Archivist Meetings: Not on file   Marital Status: Not on file     Family History:  The patient's family history includes Breast cancer in her maternal aunt and sister; Cancer in her sister; Diabetes in her mother; Heart disease in her father; Leukemia in her father; Thyroid cancer in her sister. There is no history of Colon cancer.  ROS:   Review of Systems  Constitutional:  Positive for malaise/fatigue. Negative for chills, diaphoresis, fever and weight loss.  HENT:  Negative for congestion.   Eyes:  Negative for discharge and redness.  Respiratory:  Positive for cough and shortness of breath. Negative for sputum production and wheezing.   Cardiovascular:  Negative for chest pain, palpitations, orthopnea, claudication, leg swelling and PND.  Gastrointestinal:  Negative for abdominal pain, blood in stool, heartburn, melena, nausea and vomiting.  Musculoskeletal:  Negative for falls and myalgias.  Skin:  Negative for rash.  Neurological:  Positive for weakness. Negative for dizziness, tingling, tremors, sensory change, speech change, focal weakness and loss of consciousness.  Endo/Heme/Allergies:  Does not bruise/bleed  easily.  Psychiatric/Behavioral:  Negative for substance abuse. The patient is not nervous/anxious.   All other systems reviewed and are negative.   EKGs/Labs/Other Studies Reviewed:    Studies reviewed were summarized above. The additional studies were reviewed today:  2D echo 03/20/2021: 1. Left ventricular ejection fraction, by estimation, is 50 to 55%. The  left ventricle has low normal function. The left ventricle has no regional  wall motion abnormalities. There is moderate left ventricular hypertrophy.  Left ventricular  diastolic  function could not be evaluated.   2. Right ventricular systolic function is mildly reduced. The right  ventricular size is normal. There is normal pulmonary artery systolic  pressure.   3. Left atrial size was moderately dilated.   4. Right atrial size was moderately dilated.   5. Moderate pericardial effusion. The pericardial effusion is posterior  to the left ventricle. There is no evidence of cardiac tamponade.   6. The mitral valve is degenerative. Moderate mitral valve regurgitation.  Moderate to severe mitral annular calcification.   7. Tricuspid valve is mildly thickened. The tricuspid valve is abnormal.  Tricuspid valve regurgitation is moderate.   8. The aortic valve is tricuspid. There is mild thickening of the aortic  valve. Aortic valve regurgitation is trivial. Mild aortic valve sclerosis  is present, with no evidence of aortic valve stenosis.   9. There is mild dilatation of the ascending aorta, measuring 41 mm.  10. The inferior vena cava is normal in size with <50% respiratory  variability, suggesting right atrial pressure of 8 mmHg.   Conclusion(s)/Recommendation(s): Given moderate LVH, biatrial enlargement,  and low voltage on EKG, infiltrative cardiomyopathy, including  amyloidosis, should be considered.     EKG:  EKG is ordered today.  The EKG ordered today demonstrates A. fib, 89 bpm, low voltage QRS, left anterior  fascicular block, nonspecific ST-T changes  Recent Labs: 03/20/2021: ALT 40; B Natriuretic Peptide 335.6; TSH 3.046 03/30/2021: BUN 22; Creatinine, Ser 0.64; Hemoglobin 13.5; Platelets 174; Potassium 4.7; Sodium 136  Recent Lipid Panel    Component Value Date/Time   CHOL 196 01/29/2021 1117   TRIG 85.0 01/29/2021 1117   HDL 67.70 01/29/2021 1117   CHOLHDL 3 01/29/2021 1117   VLDL 17.0 01/29/2021 1117   LDLCALC 111 (H) 01/29/2021 1117   LDLDIRECT 104.0 08/12/2017 0932    PHYSICAL EXAM:    VS:  BP (!) 148/100 (BP Location: Left Arm, Patient Position: Sitting, Cuff Size: Normal)    Pulse 89    Ht 5' 7.5" (1.715 m)    Wt 170 lb 4 oz (77.2 kg)    LMP 06/26/1985    SpO2 97%    BMI 26.27 kg/m   BMI: Body mass index is 26.27 kg/m.  Physical Exam Constitutional:      Appearance: She is well-developed.  HENT:     Head: Normocephalic and atraumatic.  Eyes:     General:        Right eye: No discharge.        Left eye: No discharge.  Neck:     Vascular: No JVD.  Cardiovascular:     Rate and Rhythm: Normal rate. Rhythm irregularly irregular.     Pulses:          Posterior tibial pulses are 2+ on the right side and 2+ on the left side.     Heart sounds: Normal heart sounds, S1 normal and S2 normal. Heart sounds not distant. No midsystolic click and no opening snap. No murmur heard.   No friction rub.  Pulmonary:     Effort: Pulmonary effort is normal. No respiratory distress.     Breath sounds: Examination of the left-lower field reveals decreased breath sounds. Decreased breath sounds present. No wheezing or rales.  Chest:     Chest wall: No tenderness.  Abdominal:     General: There is no distension.     Palpations: Abdomen is soft.     Tenderness: There is no abdominal tenderness.  Musculoskeletal:     Cervical back: Normal range of motion.     Right lower leg: Edema present.     Left lower leg: Edema present.     Comments: Trace bilateral nonpitting lower extremity edema  with compression stockings in place  Skin:    General: Skin is warm and dry.     Nails: There is no clubbing.  Neurological:     Mental Status: She is alert and oriented to person, place, and time.  Psychiatric:        Speech: Speech normal.        Behavior: Behavior normal.        Thought Content: Thought content normal.        Judgment: Judgment normal.    Wt Readings from Last 3 Encounters:  05/04/21 170 lb 4 oz (77.2 kg)  04/23/21 168 lb 3.2 oz (76.3 kg)  03/30/21 168 lb (76.2 kg)     ASSESSMENT & PLAN:   Acute on chronic HFpEF with possible infiltrative cardiomyopathy: She continues to appear volume up with an uptrending weight by home scale and a weight that is up 2 pounds today by our scale when compared to her last visit.  Also, recent echo continued to demonstrate a moderate pericardial effusion without evidence of cardiac tamponade as well as a new left pleural effusion.  Increase furosemide to 80 mg daily along with KCl 40 mEq daily for 5 days followed by 40 mEq of furosemide and 20 mEq of KCl daily thereafter.  Check CBC and BMP today and again in follow-up.  We also briefly touched on the prior recommendation to consider cardiac MRI for further evaluation of infiltrative cardiomyopathy.  This will be revisited in follow-up.  Persistent A. fib: She remains in A. fib with controlled ventricular response with Lopressor.  She has not missed any doses of anticoagulation.  Given a CHA2DS2-VASc of at least  5 (CHF, HTN, age x2, sex category), she remains on Xarelto.  No symptoms concerning for bleeding.  Check BMP and CBC.  Schedule DCCV.  Moderate pericardial effusion: No evidence of cardiac tamponade physiology on echo obtained on 05/02/2021.  Hemodynamically stable in the office today.  Increase Lasix as outlined above.  We will look to repeat a limited echo in 1 month.  Left pleural effusion: Increase Lasix as outlined above.  Aortic insufficiency/mitral regurgitation: Monitor  with periodic echo.  HTN: Blood pressure is mildly elevated in the office today.  She remains on metoprolol and losartan with recommendation to escalate diuresis as outlined above.  Mildly dilated ascending aorta: Monitor with periodic echo.  Defer cross-sectional imaging at this time given advanced age.  Optimal blood pressure control recommended.  This was not discussed in detail at today's visit.   Shared Decision Making/Informed Consent{  The risks (stroke, cardiac arrhythmias rarely resulting in the need for a temporary or permanent pacemaker, skin irritation or burns and complications associated with conscious sedation including aspiration, arrhythmia, respiratory failure and death), benefits (restoration of normal sinus rhythm) and alternatives of a direct current cardioversion were explained in detail to Ms. Selvey and she agrees to proceed.       Disposition: F/u with Dr. Saunders Revel or an APP in 2 weeks.   Medication Adjustments/Labs and Tests Ordered: Current medicines are reviewed at length with the patient today.  Concerns regarding medicines are outlined above. Medication changes, Labs and Tests ordered today are summarized above and listed in the Patient Instructions accessible in Encounters.   Signed,  Christell Faith, PA-C 05/04/2021 5:02 PM     [facility] [address] [place] [address] [place], [place] [address] [phone]

## 2021-05-01 DIAGNOSIS — M1990 Unspecified osteoarthritis, unspecified site: Secondary | ICD-10-CM | POA: Diagnosis not present

## 2021-05-01 DIAGNOSIS — G8929 Other chronic pain: Secondary | ICD-10-CM | POA: Diagnosis not present

## 2021-05-01 DIAGNOSIS — M48061 Spinal stenosis, lumbar region without neurogenic claudication: Secondary | ICD-10-CM | POA: Diagnosis not present

## 2021-05-01 DIAGNOSIS — I110 Hypertensive heart disease with heart failure: Secondary | ICD-10-CM | POA: Diagnosis not present

## 2021-05-01 DIAGNOSIS — I4891 Unspecified atrial fibrillation: Secondary | ICD-10-CM | POA: Diagnosis not present

## 2021-05-01 DIAGNOSIS — I5031 Acute diastolic (congestive) heart failure: Secondary | ICD-10-CM | POA: Diagnosis not present

## 2021-05-02 ENCOUNTER — Telehealth: Payer: Self-pay | Admitting: Physician Assistant

## 2021-05-02 ENCOUNTER — Ambulatory Visit (INDEPENDENT_AMBULATORY_CARE_PROVIDER_SITE_OTHER): Payer: Medicare Other

## 2021-05-02 ENCOUNTER — Other Ambulatory Visit: Payer: Self-pay

## 2021-05-02 DIAGNOSIS — I3139 Other pericardial effusion (noninflammatory): Secondary | ICD-10-CM

## 2021-05-02 LAB — ECHOCARDIOGRAM LIMITED
S' Lateral: 2.9 cm
Single Plane A4C EF: 56.5 %

## 2021-05-02 NOTE — Telephone Encounter (Signed)
Patient dropped off BP readings, placed in box

## 2021-05-03 NOTE — Telephone Encounter (Signed)
Spoke with patient and advised that we received her blood pressure list. Advised that I would pass this on to the provider she is seeing tomorrow so he can review with her at that visit. She was appreciative for the call and had no further questions at this time.

## 2021-05-04 ENCOUNTER — Ambulatory Visit (INDEPENDENT_AMBULATORY_CARE_PROVIDER_SITE_OTHER): Payer: Medicare Other | Admitting: Physician Assistant

## 2021-05-04 ENCOUNTER — Encounter: Payer: Self-pay | Admitting: Physician Assistant

## 2021-05-04 ENCOUNTER — Other Ambulatory Visit: Payer: Self-pay

## 2021-05-04 ENCOUNTER — Other Ambulatory Visit: Payer: Self-pay | Admitting: Internal Medicine

## 2021-05-04 VITALS — BP 148/100 | HR 89 | Ht 67.5 in | Wt 170.2 lb

## 2021-05-04 DIAGNOSIS — I340 Nonrheumatic mitral (valve) insufficiency: Secondary | ICD-10-CM | POA: Diagnosis not present

## 2021-05-04 DIAGNOSIS — I3139 Other pericardial effusion (noninflammatory): Secondary | ICD-10-CM | POA: Diagnosis not present

## 2021-05-04 DIAGNOSIS — I059 Rheumatic mitral valve disease, unspecified: Secondary | ICD-10-CM | POA: Diagnosis not present

## 2021-05-04 DIAGNOSIS — I351 Nonrheumatic aortic (valve) insufficiency: Secondary | ICD-10-CM

## 2021-05-04 DIAGNOSIS — I4891 Unspecified atrial fibrillation: Secondary | ICD-10-CM | POA: Diagnosis not present

## 2021-05-04 DIAGNOSIS — I4819 Other persistent atrial fibrillation: Secondary | ICD-10-CM | POA: Diagnosis not present

## 2021-05-04 DIAGNOSIS — J90 Pleural effusion, not elsewhere classified: Secondary | ICD-10-CM

## 2021-05-04 DIAGNOSIS — I77810 Thoracic aortic ectasia: Secondary | ICD-10-CM | POA: Diagnosis not present

## 2021-05-04 DIAGNOSIS — I10 Essential (primary) hypertension: Secondary | ICD-10-CM | POA: Diagnosis not present

## 2021-05-04 DIAGNOSIS — I5033 Acute on chronic diastolic (congestive) heart failure: Secondary | ICD-10-CM

## 2021-05-04 DIAGNOSIS — I5030 Unspecified diastolic (congestive) heart failure: Secondary | ICD-10-CM | POA: Diagnosis not present

## 2021-05-04 DIAGNOSIS — I071 Rheumatic tricuspid insufficiency: Secondary | ICD-10-CM | POA: Diagnosis not present

## 2021-05-04 MED ORDER — METOPROLOL TARTRATE 50 MG PO TABS
50.0000 mg | ORAL_TABLET | Freq: Two times a day (BID) | ORAL | 0 refills | Status: DC
Start: 2021-05-04 — End: 2021-06-07
  Filled 2021-05-04 – 2021-06-04 (×3): qty 60, 30d supply, fill #0

## 2021-05-04 MED ORDER — POTASSIUM CHLORIDE CRYS ER 20 MEQ PO TBCR
20.0000 meq | EXTENDED_RELEASE_TABLET | Freq: Every day | ORAL | 0 refills | Status: DC
  Filled 2021-05-04: qty 30, 30d supply, fill #0

## 2021-05-04 MED ORDER — LOSARTAN POTASSIUM 50 MG PO TABS
50.0000 mg | ORAL_TABLET | Freq: Every day | ORAL | 0 refills | Status: DC
Start: 2021-05-04 — End: 2021-05-18
  Filled 2021-05-04: qty 30, 30d supply, fill #0

## 2021-05-04 MED ORDER — FUROSEMIDE 40 MG PO TABS
40.0000 mg | ORAL_TABLET | Freq: Every day | ORAL | 0 refills | Status: DC
  Filled 2021-05-04 – 2021-05-07 (×2): qty 30, 30d supply, fill #0

## 2021-05-04 MED ORDER — RIVAROXABAN 20 MG PO TABS
20.0000 mg | ORAL_TABLET | Freq: Every day | ORAL | 0 refills | Status: DC
  Filled 2021-05-04: qty 30, 30d supply, fill #0

## 2021-05-04 NOTE — Patient Instructions (Addendum)
Medication Instructions:  Your physician has recommended you make the following change in your medication:   INCREASE Furosemide 40 mg and take 2 tablets (80 mg) once a day for 5 days. THEN go back to taking Furosemide 40 mg and take 1 tablet daily. INCREASE Potassium 20 mEq and take 2 tablets (40 mg) once a day for 5 days. THEN go back to Potassium 20 mEq and take 1 tablet daily.   *If you need a refill on your cardiac medications before your next appointment, please call your pharmacy*   Lab Work: CBC, BMET today  If you have labs (blood work) drawn today and your tests are completely normal, you will receive your results only by: [place] (if you have MyChart) OR A paper copy in the mail If you have any lab test that is abnormal or we need to change your treatment, we will call you to review the results.   Testing/Procedures: You are scheduled for a Cardioversion on Friday May 11, 2021 with Dr. Rockey Situ Please arrive at the [facility] at 06:30 a.m. on the day of your procedure.  DIET INSTRUCTIONS:  Nothing to eat or drink after midnight except your medications with a small sip of water.         HOLD: Furosemide & Potassium just the morning of your cardioversion and then take it once you get home that day.   Medications:  YOU MAY TAKE ALL of your remaining medications with a small amount of water.  Must have a responsible person to drive you home.  Bring a current list of your medications and current insurance cards.    If you have any questions after you get home, please call the office at [phone]    Follow-Up: At [facility], you and your health needs are our priority.  As part of our continuing mission to provide you with exceptional heart care, we have created designated Provider Care Teams.  These Care Teams include your primary Cardiologist (physician) and Advanced Practice Providers (APPs -  Physician Assistants and Nurse Practitioners) who all  work together to provide you with the care you need, when you need it.   Your next appointment:   May 18, 2021 at 2:00 pm  The format for your next appointment:   In Person  Provider:   Christell Faith, PA-C

## 2021-05-05 ENCOUNTER — Other Ambulatory Visit: Payer: Self-pay | Admitting: Internal Medicine

## 2021-05-05 LAB — CBC
Hematocrit: 42.9 % (ref 34.0–46.6)
Hemoglobin: 14.1 g/dL (ref 11.1–15.9)
MCH: 29.9 pg (ref 26.6–33.0)
MCHC: 32.9 g/dL (ref 31.5–35.7)
MCV: 91 fL (ref 79–97)
Platelets: 190 10*3/uL (ref 150–450)
RBC: 4.71 x10E6/uL (ref 3.77–5.28)
RDW: 13.5 % (ref 11.7–15.4)
WBC: 6 10*3/uL (ref 3.4–10.8)

## 2021-05-05 LAB — BASIC METABOLIC PANEL
BUN/Creatinine Ratio: 18 (ref 12–28)
BUN: 15 mg/dL (ref 8–27)
CO2: 26 mmol/L (ref 20–29)
Calcium: 9.6 mg/dL (ref 8.7–10.3)
Chloride: 96 mmol/L (ref 96–106)
Creatinine, Ser: 0.84 mg/dL (ref 0.57–1.00)
Glucose: 90 mg/dL (ref 70–99)
Potassium: 4.2 mmol/L (ref 3.5–5.2)
Sodium: 135 mmol/L (ref 134–144)
eGFR: 68 mL/min/{1.73_m2} (ref >59)

## 2021-05-07 ENCOUNTER — Telehealth: Payer: Self-pay | Admitting: Internal Medicine

## 2021-05-07 ENCOUNTER — Other Ambulatory Visit: Payer: Self-pay | Admitting: *Deleted

## 2021-05-07 ENCOUNTER — Other Ambulatory Visit: Payer: Self-pay

## 2021-05-07 NOTE — Telephone Encounter (Signed)
Pt daughter returning call. Pt daughter requesting call back at [phone]. Pt daughter stated that she is playing phone tag.

## 2021-05-07 NOTE — Telephone Encounter (Signed)
Patient's daughter called and is requesting that patient's furosemide (LASIX) 40 MG tablet be sent to [facility], in [place] on [address].

## 2021-05-07 NOTE — Telephone Encounter (Signed)
Called and spoke to daughter she states that cardiology recommended that she double her lasix and potassium for 5 days. She will be out on Wednesday and wanted to know if she needs to get updated script from our office or cardiology.

## 2021-05-07 NOTE — Telephone Encounter (Signed)
Patient's daughter returned office phone call.

## 2021-05-07 NOTE — Telephone Encounter (Signed)
Left message to return call to our office.  

## 2021-05-07 NOTE — Telephone Encounter (Signed)
Called l/m to call office.  Looks like refill was sent in 12/2.

## 2021-05-08 ENCOUNTER — Other Ambulatory Visit: Payer: Self-pay | Admitting: Internal Medicine

## 2021-05-08 DIAGNOSIS — M9903 Segmental and somatic dysfunction of lumbar region: Secondary | ICD-10-CM | POA: Diagnosis not present

## 2021-05-08 DIAGNOSIS — M9905 Segmental and somatic dysfunction of pelvic region: Secondary | ICD-10-CM | POA: Diagnosis not present

## 2021-05-08 DIAGNOSIS — M955 Acquired deformity of pelvis: Secondary | ICD-10-CM | POA: Diagnosis not present

## 2021-05-08 DIAGNOSIS — M5136 Other intervertebral disc degeneration, lumbar region: Secondary | ICD-10-CM | POA: Diagnosis not present

## 2021-05-08 MED ORDER — FUROSEMIDE 40 MG PO TABS: ORAL_TABLET | ORAL | 0 refills | Status: DC

## 2021-05-08 MED ORDER — POTASSIUM CHLORIDE CRYS ER 20 MEQ PO TBCR: EXTENDED_RELEASE_TABLET | ORAL | 1 refills | Status: DC

## 2021-05-08 NOTE — Telephone Encounter (Signed)
Left message to return call to office.

## 2021-05-08 NOTE — Addendum Note (Signed)
Addended by: Alisa Graff on: 05/08/2021 04:43 PM   Modules accepted: Orders

## 2021-05-08 NOTE — Telephone Encounter (Signed)
I do not mind refilling her lasix and potassium.  She just saw cardiology and has a f/u scheduled with them 05/18/21 and they have adjusted medication.  Given they are adjusting, may make more sense for them to refill, but let me know if any problems.  I do not want her to run out of her medication.

## 2021-05-08 NOTE — Telephone Encounter (Signed)
Spoken to patient. She would like enough to last her until her appointment with cardiology.

## 2021-05-08 NOTE — Telephone Encounter (Signed)
Pts daughter called in regards to the medication. Now when pt went to pickup medication from [facility], the pharmacy told her her insurance would not cover it. Pts daughter is going to go by the pharmacy since prescriptions were sent in yesterday and today. Pts daughter will give [place] a call back regarding this.

## 2021-05-09 DIAGNOSIS — G8929 Other chronic pain: Secondary | ICD-10-CM | POA: Diagnosis not present

## 2021-05-09 DIAGNOSIS — M48061 Spinal stenosis, lumbar region without neurogenic claudication: Secondary | ICD-10-CM | POA: Diagnosis not present

## 2021-05-09 DIAGNOSIS — I110 Hypertensive heart disease with heart failure: Secondary | ICD-10-CM | POA: Diagnosis not present

## 2021-05-09 DIAGNOSIS — M1990 Unspecified osteoarthritis, unspecified site: Secondary | ICD-10-CM | POA: Diagnosis not present

## 2021-05-09 DIAGNOSIS — I5031 Acute diastolic (congestive) heart failure: Secondary | ICD-10-CM | POA: Diagnosis not present

## 2021-05-09 DIAGNOSIS — I4891 Unspecified atrial fibrillation: Secondary | ICD-10-CM | POA: Diagnosis not present

## 2021-05-09 NOTE — Telephone Encounter (Signed)
Verified patient was able to pick medications up.

## 2021-05-09 NOTE — Telephone Encounter (Signed)
I talked to Deborah Baxter 05/08/21 and I sent in a new prescription for lasix with new directions.  Sent in a new rx for potassium.  See if pharmacy able to refill and confirm pt will be able to get the medication.

## 2021-05-09 NOTE — Telephone Encounter (Signed)
Left message to call office

## 2021-05-10 ENCOUNTER — Other Ambulatory Visit: Payer: Self-pay | Admitting: Cardiovascular Disease

## 2021-05-10 DIAGNOSIS — I4819 Other persistent atrial fibrillation: Secondary | ICD-10-CM

## 2021-05-11 ENCOUNTER — Ambulatory Visit: Payer: Medicare Other | Admitting: Anesthesiology

## 2021-05-11 ENCOUNTER — Encounter
Admission: RE | Disposition: A | Discharge status: Home / Self Care | Payer: Self-pay | Source: Home / Self Care | Attending: Cardiovascular Disease

## 2021-05-11 ENCOUNTER — Encounter: Payer: Self-pay | Admitting: Cardiovascular Disease

## 2021-05-11 ENCOUNTER — Ambulatory Visit
Admission: RE | Admit: 2021-05-11 | Discharge: 2021-05-11 | Disposition: A | Discharge status: Home / Self Care | Payer: Medicare Other | Attending: Cardiovascular Disease | Admitting: Cardiovascular Disease

## 2021-05-11 DIAGNOSIS — I4430 Unspecified atrioventricular block: Secondary | ICD-10-CM | POA: Insufficient documentation

## 2021-05-11 DIAGNOSIS — I444 Left anterior fascicular block: Secondary | ICD-10-CM | POA: Diagnosis not present

## 2021-05-11 DIAGNOSIS — I4819 Other persistent atrial fibrillation: Secondary | ICD-10-CM | POA: Insufficient documentation

## 2021-05-11 DIAGNOSIS — Z87891 Personal history of nicotine dependence: Secondary | ICD-10-CM | POA: Insufficient documentation

## 2021-05-11 DIAGNOSIS — E7800 Pure hypercholesterolemia, unspecified: Secondary | ICD-10-CM | POA: Insufficient documentation

## 2021-05-11 DIAGNOSIS — I4892 Unspecified atrial flutter: Secondary | ICD-10-CM | POA: Insufficient documentation

## 2021-05-11 DIAGNOSIS — I4891 Unspecified atrial fibrillation: Secondary | ICD-10-CM | POA: Diagnosis not present

## 2021-05-11 HISTORY — PX: CARDIOVERSION: SHX1299

## 2021-05-11 SURGERY — CARDIOVERSION
Anesthesia: General

## 2021-05-11 MED ORDER — PROPOFOL 10 MG/ML IV BOLUS
INTRAVENOUS | Status: DC | PRN
  Administered 2021-05-11: 20 mg via INTRAVENOUS
  Administered 2021-05-11: 30 mg via INTRAVENOUS
  Administered 2021-05-11: 10 mg via INTRAVENOUS

## 2021-05-11 MED ORDER — SODIUM CHLORIDE 0.9 % IV SOLN: INTRAVENOUS | Status: DC

## 2021-05-11 NOTE — Anesthesia Postprocedure Evaluation (Signed)
Anesthesia Post Note  Patient: Deborah Baxter  Procedure(s) Performed: CARDIOVERSION  Patient location: [facility]. Anesthesia Type: General Level of consciousness: awake and alert Pain management: pain level controlled Vital Signs Assessment: post-procedure vital signs reviewed and stable Respiratory status: spontaneous breathing, nonlabored ventilation and respiratory function stable Cardiovascular status: blood pressure returned to baseline and stable Postop Assessment: no apparent nausea or vomiting Anesthetic complications: no   No notable events documented.   Last Vitals:  Vitals:   05/11/21 0815 05/11/21 0830  BP: 107/65 109/69  Pulse: (!) 58 61  Resp: 15 17  Temp:    SpO2: 97% 98%    Last Pain:  Vitals:   05/11/21 0830  PainSc: 0-No pain                 [place] Ouch

## 2021-05-11 NOTE — Anesthesia Preprocedure Evaluation (Addendum)
Anesthesia Evaluation  Patient identified by MRN, date of birth, ID band Patient awake    Reviewed: Allergy & Precautions, NPO status , Patient's Chart, lab work & pertinent test results, reviewed documented beta blocker date and time   Airway Mallampati: II  TM Distance: >3 FB Neck ROM: Full    Dental  (+) Missing,    Pulmonary shortness of breath and with exertion, former smoker,    Pulmonary exam normal        Cardiovascular Exercise Tolerance: Poor METS: < 3 Mets hypertension, Pt. on medications and Pt. on home beta blockers +CHF (heart failure with preserved ejection fraction ) and + DOE  Normal cardiovascular exam+ dysrhythmias Atrial Fibrillation + Valvular Problems/Murmurs (Aortic insufficiency/mitral regurgitation)  Rhythm:Irregular Rate:Normal - Systolic murmurs and - Peripheral Edema Possible infiltrative cardiomyopathy.   H/o Atrial fibrillation with rapid ventricular response   H/o hypertensive urgency  EKG 12/9:  Atrial flutter with predominant 4:1 AV block Ventricular premature complex Left anterior fascicular block Borderline low voltage, extremity leads Nonspecific T abnormalities, lateral leads  ECHO 11/30: 1. Left ventricular ejection fraction, by estimation, is 50%. There is  moderate left ventricular hypertrophy.  2. Right ventricular systolic function is normal. The right ventricular  size is normal.  3. Moderate pericardial effusion. The pericardial effusion is posterior  and lateral to the left ventricle. There is no evidence of cardiac  tamponade. Moderate pleural effusion in the left lateral region.  4. Mild mitral valve regurgitation.  5. The aortic valve is tricuspid. Aortic valve regurgitation is trivial.  6. Aortic dilatation noted. There is mild dilatation of the ascending  aorta, measuring 41 mm.  7. The inferior vena cava is normal in size with <50% respiratory  variability,  suggesting right atrial pressure of 8 mmHg.   Neuro/Psych Spinal stenosis H/O syncope  Neuromuscular disease negative psych ROS   GI/Hepatic negative GI ROS, Neg liver ROS,   Endo/Other  negative endocrine ROS  Renal/GU negative Renal ROS     Musculoskeletal  (+) Arthritis ,   Abdominal Normal abdominal exam  (+)   Peds  Hematology negative hematology ROS (+)   Anesthesia Other Findings   Reproductive/Obstetrics                           Anesthesia Physical  Anesthesia Plan  ASA: III  Anesthesia Plan: General   Post-op Pain Management:    Induction:   PONV Risk Score and Plan: Treatment may vary due to age or medical condition and Propofol infusion  Airway Management Planned: Nasal Cannula and Natural Airway  Additional Equipment:   Intra-op Plan:   Post-operative Plan:   Informed Consent: I have reviewed the patients History and Physical, chart, labs and discussed the procedure including the risks, benefits and alternatives for the proposed anesthesia with the patient or authorized representative who has indicated his/her understanding and acceptance.     Dental advisory given  Plan Discussed with: CRNA  Anesthesia Plan Comments:       Anesthesia Quick Evaluation

## 2021-05-11 NOTE — Anesthesia Procedure Notes (Signed)
Date/Time: 05/11/2021 7:38 AM Performed by: Johnna Acosta, CRNA Pre-anesthesia Checklist: Patient identified, Emergency Drugs available, Suction available, Patient being monitored and Timeout performed Patient Re-evaluated:Patient Re-evaluated prior to induction Oxygen Delivery Method: Nasal cannula Preoxygenation: Pre-oxygenation with 100% oxygen Induction Type: IV induction

## 2021-05-11 NOTE — Transfer of Care (Signed)
Immediate Anesthesia Transfer of Care Note  Patient: Deborah Baxter  Procedure(s) Performed: CARDIOVERSION  Patient Location: PACU  Anesthesia Type:General  Level of Consciousness: sedated  Airway & Oxygen Therapy: Patient Spontanous Breathing and Patient connected to nasal cannula oxygen  Post-op Assessment: Report given to RN and Post -op Vital signs reviewed and stable  Post vital signs: Reviewed and stable  Last Vitals:  Vitals Value Taken Time  BP 117/74 05/11/21 0746  Temp    Pulse 58 05/11/21 0746  Resp 16 05/11/21 0746  SpO2 96 % 05/11/21 0746    Last Pain:  Vitals:   05/11/21 0657  PainSc: 0-No pain         Complications: No notable events documented.

## 2021-05-11 NOTE — CV Procedure (Signed)
Cardioversion procedure note °For atrial fibrillation, persistent ° °Procedure Details: ° °Consent: Risks of procedure as well as the alternatives and risks of each were explained to the (patient/caregiver).  Consent for procedure obtained. ° °Time Out: Verified patient identification, verified procedure, site/side was marked, verified correct patient position, special equipment/implants available, medications/allergies/relevent history reviewed, required imaging and test results available.  Performed ° °Patient placed on cardiac monitor, pulse oximetry, supplemental oxygen as necessary.   °Sedation given: propofol IV, Dr. Johnson °Pacer pads placed anterior and posterior chest. ° ° °Cardioverted 1 time(s).   °Cardioverted at  150 J. Synchronized biphasic °Converted to NSR ° ° °Evaluation: °Findings: Post procedure EKG shows: NSR °Complications: None °Patient did tolerate procedure well. ° °Time Spent Directly with the Patient: ° °45 minutes  ° °Deborah Baxter, M.D., Ph.D.  °

## 2021-05-14 ENCOUNTER — Encounter: Payer: Self-pay | Admitting: Cardiovascular Disease

## 2021-05-14 NOTE — Progress Notes (Signed)
Cardiology Office Note    Date:  05/18/2021   ID:  Deborah Baxter, DOB 08-28-1933, MRN [id number]  PCP:  Einar Pheasant, MD  Cardiologist:  Nelva Bush, MD  Electrophysiologist:  None   Chief Complaint: Follow-up  History of Present Illness:   Deborah Baxter is a 87 y.o. female with history of A. fib of uncertain chronicity though at least persistent status post successful DCCV on 05/11/2021, HFpEF, possible infiltrative cardiomyopathy, pericardial effusion, pleural effusion, syncope, HTN, HLD, osteoarthritis, prior tobacco use, and spinal stenosis who presents for follow-up of A. fib and HFpEF.   Prior ED evaluation for syncope in 11/2020 with negative orthostatics and largely unrevealing labs.  Cardiac enzymes not cycled.  EKG unavailable for review.   She was seen at a local urgent care on 10/18 for hand pain and was found to be in A. fib of uncertain chronicity.  At that time she had reported exertional dyspnea over the preceding several weeks.  Given A. fib, it was recommended she be evaluated in the ER.   She was admitted to [facility] from 8/18 through 8/20 with acute HFpEF, A. fib with RVR of uncertain chronicity, and moderate pericardial effusion.  EKG demonstrated A. fib with RVR, 121 bpm, low voltage QRS, and nonspecific ST-T changes.  High-sensitivity troponin of 28 with a delta of 17.  BNP 335.  Echo demonstrated a low normal LV systolic function with an EF of 50 to 55%, no regional wall motion abnormalities, moderate LVH, LV diastolic function parameters could not be evaluated, mildly reduced RV systolic function with normal ventricular cavity size, normal PASP, moderate biatrial enlargement, moderate pericardial effusion that was posterior to the LV without evidence of cardiac tamponade, degenerative mitral valve with moderate regurgitation and moderate to severe mitral annular calcification, moderate tricuspid regurgitation, tricuspid aortic valve with trivial insufficiency and mild  aortic valve sclerosis without evidence of stenosis, mildly dilated ascending aorta measuring 41 mm, and an estimated right atrial pressure of 8 mmHg.  Given moderate LVH, biatrial enlargement, and low voltage on EKG, infiltrative cardiomyopathy was recommended to be considered.  She responded well to IV diuresis.  With regards to her A. fib, she was rate controlled with beta-blocker and initiated on Xarelto.   She was seen in hospital follow-up on 03/30/2021 and was doing reasonably well without tachypalpitations and a noted improvement in her energy.  She still noted some exertional shortness of breath.  She reported adherence to medications and was without symptoms of bleeding.  She remained in A. fib with a ventricular rate of 94 bpm on EKG.  She was felt to be still mildly volume up with recommendation to increase furosemide to 80 mg for 3 days followed by 40 mg daily there after along with increasing KCl to 40 mEq for 3 days followed by 20 mill equivalent daily thereafter.  Subsequent repeat limited echo on 05/02/2021 demonstrated an EF of 50%, moderate LVH, normal RV systolic function and ventricular cavity size, stable moderate pericardial effusion that was posterior and lateral to the left ventricle without evidence of cardiac tamponade, moderate pleural effusion in the left lateral region, mild mitral regurgitation, trivial aortic insufficiency, mildly dilated ascending aorta measuring 41 mm, and an estimated right atrial pressure of 8 mmHg.   She was evaluated by her PCP in late 04/2021 with URI-like symptoms.  Lasix was briefly increased to 40 mg twice daily for 3 days per family.  Chest x-ray showed chronic scarring within the left lung base  and no new focal abnormality.  She was seen in follow-up on 05/04/2021 and reported her breathing was largely stable, though did continue to note exertional dyspnea.  Her URI symptoms were improved.  She was without chest pain or palpitations.  Her weights had  trended from 163 to 169 pounds.  She was taking furosemide 40 mg daily.  She remained in rate controlled A. fib and had not missed any doses of anticoagulation.  No bleeding concerns.  Her Lasix was titrated to 80 mg daily for 5 days followed by 40 mg daily thereafter.  She underwent successful DCCV without complication on 05/06/5808 following 1 synchronized cardioversion at 150 J with postprocedure EKG demonstrating NSR.  She comes in accompanied by her daughter today and is doing well from a cardiac perspective.  Since undergoing DCCV, she has redeveloped A. fib with controlled ventricular response at some point.  She is asymptomatic regarding this.  By our scale, her weight is down 6 pounds following outpatient diuresis.  By her scale, her weight is down 8 pounds since earlier this month.  She remains on furosemide 40 mg daily along with KCl 20 mEq daily.  No chest pain, dyspnea, palpitations, dizziness, presyncope, or syncope.  No lower extremity swelling, abdominal distention, orthopnea, or early satiety.  Since she was last seen, her functional status has improved.  She is now doing her own laundry, which is downstairs, and sweeping the basement stairs.  She is tolerating Xarelto without any falls, hematochezia, melena, hemoptysis, hematemesis, or hematuria.  Blood pressure at home has ranged from the low 100s to upper 983J systolic with most readings in the 1 teens to 825K systolic.  She is pleased with how she is currently feeling and does not have any active issues or concerns at this time.   Labs independently reviewed: 05/2021 - BUN 15, serum creatinine 0.84, potassium 4.2, Hgb 14.1, PLT 190 03/2021 - TSH normal, albumin 3.7, AST/ALT normal 01/2021 - TC 196, TG 85, HDL 67, LDL 111 11/2020 - magnesium 1.8  Past Medical History:  Diagnosis Date   Hx of degenerative disc disease    Hypercholesterolemia    Neuromuscular disorder ([place])    pinched nerve   Osteoarthritis    Seasonal allergies     Spinal stenosis    chronic back and leg pain    Past Surgical History:  Procedure Laterality Date   APPENDECTOMY     bladder tack surgery  2000   [place] N/A 05/11/2021   Procedure: CARDIOVERSION;  Surgeon: Minna Merritts, MD;  Location: [facility] ORS;  Service: Cardiovascular;  Laterality: N/A;   CARPAL TUNNEL RELEASE     CATARACT EXTRACTION W/PHACO Left 12/07/2015   Procedure: CATARACT EXTRACTION PHACO AND INTRAOCULAR LENS PLACEMENT ([place]);  Surgeon: Eulogio Bear, MD;  Location: [facility] ORS;  Service: Ophthalmology;  Laterality: Left;  [place] 1.23 AP% 23.2 CDE 19.28 Fluid Pack Lot # [id number]   EYE SURGERY  03/19/15   GANGLION CYST EXCISION     right hand   GAS/FLUID EXCHANGE Left 03/08/2015   Procedure: GAS/FLUID EXCHANGE;  Surgeon: Milus Height, MD;  Location: [facility] ORS;  Service: Ophthalmology;  Laterality: Left;   TONSILLECTOMY     TUBAL LIGATION     VITRECTOMY 25 GAUGE WITH SCLERAL BUCKLE Left 03/08/2015   Procedure: VITRECTOMY 25 GAUGE, membrane peel, 26 %  SF6 gas exchange;  Surgeon: Milus Height, MD;  Location: [facility] ORS;  Service: Ophthalmology;  Laterality: Left;  casette lot #  [id number]    Current Medications: Current Meds  Medication Sig   acetaminophen (TYLENOL) 650 MG CR tablet Take 650 mg by mouth every 8 (eight) hours as needed for pain. Reported on 08/02/2015   furosemide (LASIX) 40 MG tablet Take 1-2 tablets q day (as instructed)   losartan (COZAAR) 25 MG tablet Take 1 tablet (25 mg total) by mouth daily.   metoprolol tartrate (LOPRESSOR) 50 MG tablet Take 1 tablet (50 mg total) by mouth 2 (two) times daily.   Multiple Vitamin (MULTIVITAMIN) tablet Take 1 tablet by mouth daily.   OVER THE COUNTER MEDICATION Place 1 drop into both eyes every 8 (eight) hours as needed (dry eyes.). Lubricant eye drops   Polyethylene Glycol 3350 (MIRALAX PO) Take 17 g by mouth every 3 (three) days. Every 3 days   potassium chloride (KLOR-CON) 20 MEQ packet Take 20 mEq  by mouth daily.   XARELTO 20 MG TABS tablet TAKE 1 TABLET(20 MG) BY MOUTH DAILY WITH SUPPER   [DISCONTINUED] losartan (COZAAR) 50 MG tablet Take 1 tablet (50 mg total) by mouth daily.   [DISCONTINUED] potassium chloride SA (KLOR-CON M) 20 MEQ tablet TAKE 1 TABLET BY MOUTH DAILY.  TAKE THIS MEDICATION WHILE TAKING LASIX    Allergies:   Quinolones   Social History   Socioeconomic History   Marital status: Widowed    Spouse name: Not on file   Number of children: 5   Years of education: Not on file   Highest education level: Not on file  Occupational History   Not on file  Tobacco Use   Smoking status: Former   Smokeless tobacco: Never  Vaping Use   Vaping Use: Never used  Substance and Sexual Activity   Alcohol use: Yes    Alcohol/week: 0.0 standard drinks    Comment: occassional   Drug use: No   Sexual activity: Not on file  Other Topics Concern   Not on file  Social History Narrative   Not on file   Social Determinants of Health   Financial Resource Strain: Low Risk    Difficulty of Paying Living Expenses: Not hard at all  Food Insecurity: No Food Insecurity   Worried About Charity fundraiser in the Last Year: Never true   [place] in the Last Year: Never true  Transportation Needs: No Transportation Needs   Lack of Transportation (Medical): No   Lack of Transportation (Non-Medical): No  Physical Activity: Insufficiently Active   Days of Exercise per Week: 3 days   Minutes of Exercise per Session: 20 min  Stress: No Stress Concern Present   Feeling of Stress : Not at all  Social Connections: Unknown   Frequency of Communication with Friends and Family: More than three times a week   Frequency of Social Gatherings with Friends and Family: More than three times a week   Attends Religious Services: Not on Electrical engineer or Organizations: Not on file   Attends Archivist Meetings: Not on file   Marital Status: Not on file      Family History:  The patient's family history includes Breast cancer in her maternal aunt and sister; Cancer in her sister; Diabetes in her mother; Heart disease in her father; Leukemia in her father; Thyroid cancer in her sister. There is no history of Colon cancer.  ROS:   Review of Systems  Constitutional:  Positive for malaise/fatigue. Negative for chills, diaphoresis, fever and weight loss.  HENT:  Negative for congestion.   Eyes:  Negative for discharge and redness.  Respiratory:  Negative for cough, hemoptysis, sputum production, shortness of breath and wheezing.   Cardiovascular:  Negative for chest pain, palpitations, orthopnea, claudication, leg swelling and PND.  Gastrointestinal:  Negative for blood in stool and melena.  Genitourinary:  Negative for hematuria.  Musculoskeletal:  Negative for falls and myalgias.  Skin:  Negative for rash.  Neurological:  Negative for dizziness, tingling, tremors, sensory change, speech change, focal weakness, loss of consciousness and weakness.  Endo/Heme/Allergies:  Does not bruise/bleed easily.  Psychiatric/Behavioral:  Negative for substance abuse. The patient is not nervous/anxious.   All other systems reviewed and are negative.   EKGs/Labs/Other Studies Reviewed:    Studies reviewed were summarized above. The additional studies were reviewed today:  2D echo 03/20/2021: 1. Left ventricular ejection fraction, by estimation, is 50 to 55%. The  left ventricle has low normal function. The left ventricle has no regional  wall motion abnormalities. There is moderate left ventricular hypertrophy.  Left ventricular diastolic  function could not be evaluated.   2. Right ventricular systolic function is mildly reduced. The right  ventricular size is normal. There is normal pulmonary artery systolic  pressure.   3. Left atrial size was moderately dilated.   4. Right atrial size was moderately dilated.   5. Moderate pericardial effusion. The  pericardial effusion is posterior  to the left ventricle. There is no evidence of cardiac tamponade.   6. The mitral valve is degenerative. Moderate mitral valve regurgitation.  Moderate to severe mitral annular calcification.   7. Tricuspid valve is mildly thickened. The tricuspid valve is abnormal.  Tricuspid valve regurgitation is moderate.   8. The aortic valve is tricuspid. There is mild thickening of the aortic  valve. Aortic valve regurgitation is trivial. Mild aortic valve sclerosis  is present, with no evidence of aortic valve stenosis.   9. There is mild dilatation of the ascending aorta, measuring 41 mm.  10. The inferior vena cava is normal in size with <50% respiratory  variability, suggesting right atrial pressure of 8 mmHg.   Conclusion(s)/Recommendation(s): Given moderate LVH, biatrial enlargement,  and low voltage on EKG, infiltrative cardiomyopathy, including  amyloidosis, should be considered.    EKG:  EKG is ordered today.  The EKG ordered today demonstrates A. fib, 94 bpm, low voltage QRS, nonspecific ST-T changes  Recent Labs: 03/20/2021: ALT 40; B Natriuretic Peptide 335.6; TSH 3.046 05/04/2021: BUN 15; Creatinine, Ser 0.84; Hemoglobin 14.1; Platelets 190; Potassium 4.2; Sodium 135  Recent Lipid Panel    Component Value Date/Time   CHOL 196 01/29/2021 1117   TRIG 85.0 01/29/2021 1117   HDL 67.70 01/29/2021 1117   CHOLHDL 3 01/29/2021 1117   VLDL 17.0 01/29/2021 1117   LDLCALC 111 (H) 01/29/2021 1117   LDLDIRECT 104.0 08/12/2017 0932    PHYSICAL EXAM:    VS:  BP 100/70 (BP Location: Left Arm, Patient Position: Sitting, Cuff Size: Normal)   Pulse 94   Ht 5' 7.5" (1.715 m)   Wt 164 lb (74.4 kg)   LMP 06/26/1985   SpO2 95%   BMI 25.31 kg/m   BMI: Body mass index is 25.31 kg/m.  Physical Exam Vitals reviewed.  Constitutional:      Appearance: She is well-developed.  HENT:     Head: Normocephalic and atraumatic.  Eyes:     General:        Right  eye: No discharge.  Left eye: No discharge.  Neck:     Vascular: No JVD.  Cardiovascular:     Rate and Rhythm: Normal rate. Rhythm irregularly irregular.     Pulses:          Posterior tibial pulses are 2+ on the right side and 2+ on the left side.     Heart sounds: Normal heart sounds, S1 normal and S2 normal. Heart sounds not distant. No midsystolic click and no opening snap. No murmur heard.   No friction rub.     Comments: Bilateral compression stockings noted Pulmonary:     Effort: Pulmonary effort is normal. No respiratory distress.     Breath sounds: Normal breath sounds. No decreased breath sounds, wheezing or rales.  Chest:     Chest wall: No tenderness.  Abdominal:     General: There is no distension.     Palpations: Abdomen is soft.     Tenderness: There is no abdominal tenderness.  Musculoskeletal:     Cervical back: Normal range of motion.     Right lower leg: No edema.     Left lower leg: No edema.  Skin:    General: Skin is warm and dry.     Nails: There is no clubbing.  Neurological:     Mental Status: She is alert and oriented to person, place, and time.  Psychiatric:        Speech: Speech normal.        Behavior: Behavior normal.        Thought Content: Thought content normal.        Judgment: Judgment normal.    Wt Readings from Last 3 Encounters:  05/18/21 164 lb (74.4 kg)  05/11/21 160 lb (72.6 kg)  05/04/21 170 lb 4 oz (77.2 kg)     ASSESSMENT & PLAN:   HFpEF with possible infiltrative cardiomyopathy: She appears largely euvolemic and well compensated.  Following diuresis, her weight is down 6 pounds today when compared to her visit earlier this month.  She remains on furosemide 40 mg daily with KCl 20 mEq daily which will be transitioned to the potassium powder packets for ease of administration.  Check BMP to assess renal function and potassium following outpatient diuresis.  Could consider transition of ARB to ARNI or addition of SGLT2i in  follow-up, though would need to be mindful to avoid significant hypotension with these additions.  Therefore, she remains on metoprolol and losartan along with furosemide.  With regards to prior echo mentioning possible infiltrative cardiomyopathy, after discussion with the patient and daughter, given the patient's advanced age, and in the context of her overall feeling well, we will defer cardiac MRI.  I feel this is quite reasonable given how well the patient feels, and in the context of her advanced age.  Patient and daughter are in agreement.  CHF education.  Persistent A. fib: She has redeveloped A. fib with controlled ventricular response since undergoing DCCV on 05/11/2021.  Given that she is completely asymptomatic with this, we have elected to pursue rate control strategy only after discussion with the patient and her daughter.  Continue Lopressor 50 mg twice daily for rate control.  CHA2DS2-VASc at least 5 (CHF, HTN, age x2, sex category).  She remains on Xarelto 20 g without symptoms concerning for bleeding.  Creatinine clearance 57.1.  Check BMP and CBC.  Moderate pericardial effusion: No evidence of cardiac tamponade physiology on most recent echo, dated 05/02/2021.  Hemodynamically stable in the office today.  She remains on furosemide as outlined above.  Order repeat limited echo.  Left pleural effusion: Chest x-ray from 04/24/2021 without evidence of sizable effusion.  Echo performed on 05/02/2021 demonstrated a moderate left-sided pleural effusion in the lateral region.  Status post outpatient diuresis as outlined above.  Breath sounds normal on exam today.  Breathing at baseline.  She remains on furosemide as outlined above.  We will reevaluate with limited echo.  Aortic insufficiency/mitral regurgitation: Monitor with periodic echo.  HTN: Blood pressure is slightly soft in the office today.  Possibly in the setting of escalated diuresis.  Decrease losartan to 25 mg daily.  She remains on  Lopressor 50 mg twice daily and furosemide 40 mg daily.  Mildly dilated ascending aorta: Stable on most recent echo.  Monitor with periodic echo for now.  Continued optimal blood pressure control is recommended.  Defer cross-sectional imaging at this time given advanced age.    Disposition: F/u with Dr. Saunders Revel or an APP in 3 months, sooner if needed.   Medication Adjustments/Labs and Tests Ordered: Current medicines are reviewed at length with the patient today.  Concerns regarding medicines are outlined above. Medication changes, Labs and Tests ordered today are summarized above and listed in the Patient Instructions accessible in Encounters.   Signed, Christell Faith, PA-C 05/18/2021 3:34 PM     [address] [place] [address], [place] [address] [phone]

## 2021-05-17 ENCOUNTER — Other Ambulatory Visit: Payer: Self-pay | Admitting: Internal Medicine

## 2021-05-18 ENCOUNTER — Other Ambulatory Visit: Payer: Self-pay

## 2021-05-18 ENCOUNTER — Encounter: Payer: Self-pay | Admitting: Physician Assistant

## 2021-05-18 ENCOUNTER — Ambulatory Visit (INDEPENDENT_AMBULATORY_CARE_PROVIDER_SITE_OTHER): Payer: Medicare Other | Admitting: Physician Assistant

## 2021-05-18 VITALS — BP 100/70 | HR 94 | Ht 67.5 in | Wt 164.0 lb

## 2021-05-18 DIAGNOSIS — I351 Nonrheumatic aortic (valve) insufficiency: Secondary | ICD-10-CM | POA: Diagnosis not present

## 2021-05-18 DIAGNOSIS — I5032 Chronic diastolic (congestive) heart failure: Secondary | ICD-10-CM | POA: Diagnosis not present

## 2021-05-18 DIAGNOSIS — I4819 Other persistent atrial fibrillation: Secondary | ICD-10-CM

## 2021-05-18 DIAGNOSIS — I5031 Acute diastolic (congestive) heart failure: Secondary | ICD-10-CM | POA: Diagnosis not present

## 2021-05-18 DIAGNOSIS — I5030 Unspecified diastolic (congestive) heart failure: Secondary | ICD-10-CM | POA: Diagnosis not present

## 2021-05-18 DIAGNOSIS — I5033 Acute on chronic diastolic (congestive) heart failure: Secondary | ICD-10-CM | POA: Diagnosis not present

## 2021-05-18 DIAGNOSIS — J90 Pleural effusion, not elsewhere classified: Secondary | ICD-10-CM

## 2021-05-18 DIAGNOSIS — I110 Hypertensive heart disease with heart failure: Secondary | ICD-10-CM | POA: Diagnosis not present

## 2021-05-18 DIAGNOSIS — I10 Essential (primary) hypertension: Secondary | ICD-10-CM

## 2021-05-18 DIAGNOSIS — I77810 Thoracic aortic ectasia: Secondary | ICD-10-CM

## 2021-05-18 DIAGNOSIS — I340 Nonrheumatic mitral (valve) insufficiency: Secondary | ICD-10-CM

## 2021-05-18 DIAGNOSIS — I3139 Other pericardial effusion (noninflammatory): Secondary | ICD-10-CM | POA: Diagnosis not present

## 2021-05-18 DIAGNOSIS — G8929 Other chronic pain: Secondary | ICD-10-CM | POA: Diagnosis not present

## 2021-05-18 DIAGNOSIS — I4891 Unspecified atrial fibrillation: Secondary | ICD-10-CM | POA: Diagnosis not present

## 2021-05-18 DIAGNOSIS — M48061 Spinal stenosis, lumbar region without neurogenic claudication: Secondary | ICD-10-CM | POA: Diagnosis not present

## 2021-05-18 DIAGNOSIS — M1990 Unspecified osteoarthritis, unspecified site: Secondary | ICD-10-CM | POA: Diagnosis not present

## 2021-05-18 MED ORDER — LOSARTAN POTASSIUM 25 MG PO TABS: 25.0000 mg | ORAL_TABLET | Freq: Every day | ORAL | 3 refills | Status: DC

## 2021-05-18 MED ORDER — POTASSIUM CHLORIDE 20 MEQ PO PACK: 20.0000 meq | PACK | Freq: Every day | ORAL | 3 refills | Status: DC

## 2021-05-18 NOTE — Patient Instructions (Signed)
Medication Instructions:  Your physician has recommended you make the following change in your medication:   CHANGED Losartan 25 mg once a day TAKE Potassium 20 mEq one packet a day  *If you need a refill on your cardiac medications before your next appointment, please call your pharmacy*   Lab Work: BMET & CBC today  If you have labs (blood work) drawn today and your tests are completely normal, you will receive your results only by: [place] (if you have MyChart) OR A paper copy in the mail If you have any lab test that is abnormal or we need to change your treatment, we will call you to review the results.   Testing/Procedures: Your physician has requested that you have a limited echocardiogram in 2 to 3 weeks.   Echocardiography is a painless test that uses sound waves to create images of your heart. It provides your doctor with information about the size and shape of your heart and how well your hearts chambers and valves are working. This procedure takes approximately one hour. There are no restrictions for this procedure.    Follow-Up: At [facility], you and your health needs are our priority.  As part of our continuing mission to provide you with exceptional heart care, we have created designated Provider Care Teams.  These Care Teams include your primary Cardiologist (physician) and Advanced Practice Providers (APPs -  Physician Assistants and Nurse Practitioners) who all work together to provide you with the care you need, when you need it.   Your next appointment:   3 month(s)  The format for your next appointment:   In Person  Provider:   Nelva Bush, MD or Christell Faith, PA-C

## 2021-05-19 LAB — BASIC METABOLIC PANEL
BUN/Creatinine Ratio: 15 (ref 12–28)
BUN: 14 mg/dL (ref 8–27)
CO2: 26 mmol/L (ref 20–29)
Calcium: 9.5 mg/dL (ref 8.7–10.3)
Chloride: 102 mmol/L (ref 96–106)
Creatinine, Ser: 0.91 mg/dL (ref 0.57–1.00)
Glucose: 157 mg/dL — ABNORMAL HIGH (ref 70–99)
Potassium: 5 mmol/L (ref 3.5–5.2)
Sodium: 145 mmol/L — ABNORMAL HIGH (ref 134–144)
eGFR: 62 mL/min/{1.73_m2} (ref >59)

## 2021-05-19 LAB — CBC
Hematocrit: 45.3 % (ref 34.0–46.6)
Hemoglobin: 14.9 g/dL (ref 11.1–15.9)
MCH: 30.7 pg (ref 26.6–33.0)
MCHC: 32.9 g/dL (ref 31.5–35.7)
MCV: 93 fL (ref 79–97)
Platelets: 147 10*3/uL — ABNORMAL LOW (ref 150–450)
RBC: 4.85 x10E6/uL (ref 3.77–5.28)
RDW: 14 % (ref 11.7–15.4)
WBC: 5.5 10*3/uL (ref 3.4–10.8)

## 2021-05-21 ENCOUNTER — Telehealth: Payer: Self-pay | Admitting: *Deleted

## 2021-05-21 DIAGNOSIS — I5031 Acute diastolic (congestive) heart failure: Secondary | ICD-10-CM | POA: Diagnosis not present

## 2021-05-21 DIAGNOSIS — G8929 Other chronic pain: Secondary | ICD-10-CM | POA: Diagnosis not present

## 2021-05-21 DIAGNOSIS — M48061 Spinal stenosis, lumbar region without neurogenic claudication: Secondary | ICD-10-CM | POA: Diagnosis not present

## 2021-05-21 DIAGNOSIS — I4891 Unspecified atrial fibrillation: Secondary | ICD-10-CM | POA: Diagnosis not present

## 2021-05-21 DIAGNOSIS — I110 Hypertensive heart disease with heart failure: Secondary | ICD-10-CM | POA: Diagnosis not present

## 2021-05-21 DIAGNOSIS — M1990 Unspecified osteoarthritis, unspecified site: Secondary | ICD-10-CM | POA: Diagnosis not present

## 2021-05-21 MED ORDER — POTASSIUM CHLORIDE 20 MEQ PO PACK: 20.0000 meq | PACK | ORAL | 3 refills | Status: DC

## 2021-05-21 NOTE — Telephone Encounter (Signed)
Reviewed results and recommendations with patient. Instructed her to take potassium every other day and continue the lasix 40 mg once daily. She verbalized understanding of our conversation with no further questions at this time.

## 2021-05-21 NOTE — Telephone Encounter (Signed)
-----   Message from Rise Mu, PA-C sent at 05/19/2021  6:58 AM EST ----- Random glucose is elevated Renal function normal Potassium high normal Blood count normal with a mildly low platelet count Overall reassuring lab  Recommendations: -Continue Lasix 40 mg daily -Decrease KCl 20 mEq to every other day given her potassium tends to run on the high side

## 2021-05-28 NOTE — Interval H&P Note (Signed)
History and Physical Interval Note:  05/28/2021 9:15 AM  Deborah Baxter  has presented today for surgery, with the diagnosis of Cardioversion  AFib.  The various methods of treatment have been discussed with the patient and family. After consideration of risks, benefits and other options for treatment, the patient has consented to  Procedure(s): CARDIOVERSION (N/A) as a surgical intervention.  The patient's history has been reviewed, patient examined, no change in status, stable for surgery.  I have reviewed the patient's chart and labs.  Questions were answered to the patient's satisfaction.     Ida Rogue

## 2021-05-28 NOTE — H&P (Signed)
H&P Addendum, pre-cardioversion ° °Patient was seen and evaluated prior to -cardioversion procedure °Symptoms, prior testing details again confirmed with the patient °Patient examined, no significant change from prior exam °Lab work reviewed in detail personally by myself °Patient understands risk and benefit of the procedure,  °The risks (stroke, cardiac arrhythmias rarely resulting in the need for a temporary or permanent pacemaker, skin irritation or burns and complications associated with conscious sedation including aspiration, arrhythmia, respiratory failure and death), benefits (restoration of normal sinus rhythm) and alternatives of a direct current cardioversion were explained in detail °Patient willing to proceed. ° °Signed, °Tim Negin Hegg, MD, Ph.D °[facility]  °

## 2021-05-29 DIAGNOSIS — M9903 Segmental and somatic dysfunction of lumbar region: Secondary | ICD-10-CM | POA: Diagnosis not present

## 2021-05-29 DIAGNOSIS — M955 Acquired deformity of pelvis: Secondary | ICD-10-CM | POA: Diagnosis not present

## 2021-05-29 DIAGNOSIS — M5136 Other intervertebral disc degeneration, lumbar region: Secondary | ICD-10-CM | POA: Diagnosis not present

## 2021-05-29 DIAGNOSIS — M9905 Segmental and somatic dysfunction of pelvic region: Secondary | ICD-10-CM | POA: Diagnosis not present

## 2021-05-31 ENCOUNTER — Other Ambulatory Visit: Payer: Self-pay

## 2021-05-31 ENCOUNTER — Ambulatory Visit (INDEPENDENT_AMBULATORY_CARE_PROVIDER_SITE_OTHER): Payer: Medicare Other

## 2021-05-31 DIAGNOSIS — I3139 Other pericardial effusion (noninflammatory): Secondary | ICD-10-CM

## 2021-06-04 ENCOUNTER — Other Ambulatory Visit: Payer: Self-pay

## 2021-06-05 LAB — ECHOCARDIOGRAM LIMITED
S' Lateral: 2.4 cm
Single Plane A4C EF: 54.1 %

## 2021-06-07 ENCOUNTER — Other Ambulatory Visit: Payer: Self-pay

## 2021-06-07 ENCOUNTER — Telehealth: Payer: Self-pay | Admitting: Internal Medicine

## 2021-06-07 ENCOUNTER — Other Ambulatory Visit: Payer: Self-pay | Admitting: Internal Medicine

## 2021-06-07 MED ORDER — METOPROLOL TARTRATE 50 MG PO TABS: 50.0000 mg | ORAL_TABLET | Freq: Two times a day (BID) | ORAL | 1 refills | Status: DC

## 2021-06-07 NOTE — Telephone Encounter (Signed)
Rx sent in

## 2021-06-07 NOTE — Telephone Encounter (Signed)
Pts daughter Deborah Baxter called in regards to pts medication. Medication was sent to the [facility]. The pharmacy has closed and pt was not advised to pick up from their other two locations. When called from pharmacy they asked for her credit card number and that they would have it sent through a mail order pharmacy service. They have yet to receive anything. Then pt was advised [facility]. Deborah Baxter would like pts medication sent to [facility] on [address].   metoprolol tartrate (LOPRESSOR) 50 MG tablet

## 2021-06-08 ENCOUNTER — Other Ambulatory Visit: Payer: Self-pay | Admitting: Internal Medicine

## 2021-06-19 DIAGNOSIS — M955 Acquired deformity of pelvis: Secondary | ICD-10-CM | POA: Diagnosis not present

## 2021-06-19 DIAGNOSIS — M9905 Segmental and somatic dysfunction of pelvic region: Secondary | ICD-10-CM | POA: Diagnosis not present

## 2021-06-19 DIAGNOSIS — M5136 Other intervertebral disc degeneration, lumbar region: Secondary | ICD-10-CM | POA: Diagnosis not present

## 2021-06-19 DIAGNOSIS — M9903 Segmental and somatic dysfunction of lumbar region: Secondary | ICD-10-CM | POA: Diagnosis not present

## 2021-07-10 DIAGNOSIS — M9905 Segmental and somatic dysfunction of pelvic region: Secondary | ICD-10-CM | POA: Diagnosis not present

## 2021-07-10 DIAGNOSIS — M9903 Segmental and somatic dysfunction of lumbar region: Secondary | ICD-10-CM | POA: Diagnosis not present

## 2021-07-10 DIAGNOSIS — M955 Acquired deformity of pelvis: Secondary | ICD-10-CM | POA: Diagnosis not present

## 2021-07-10 DIAGNOSIS — M5136 Other intervertebral disc degeneration, lumbar region: Secondary | ICD-10-CM | POA: Diagnosis not present

## 2021-07-13 ENCOUNTER — Other Ambulatory Visit: Payer: Self-pay | Admitting: Internal Medicine

## 2021-07-30 DIAGNOSIS — M5136 Other intervertebral disc degeneration, lumbar region: Secondary | ICD-10-CM | POA: Diagnosis not present

## 2021-07-30 DIAGNOSIS — M955 Acquired deformity of pelvis: Secondary | ICD-10-CM | POA: Diagnosis not present

## 2021-07-30 DIAGNOSIS — M9903 Segmental and somatic dysfunction of lumbar region: Secondary | ICD-10-CM | POA: Diagnosis not present

## 2021-07-30 DIAGNOSIS — M9905 Segmental and somatic dysfunction of pelvic region: Secondary | ICD-10-CM | POA: Diagnosis not present

## 2021-07-31 ENCOUNTER — Ambulatory Visit (INDEPENDENT_AMBULATORY_CARE_PROVIDER_SITE_OTHER): Payer: Medicare Other

## 2021-07-31 ENCOUNTER — Encounter: Payer: Self-pay | Admitting: Internal Medicine

## 2021-07-31 ENCOUNTER — Other Ambulatory Visit: Payer: Self-pay

## 2021-07-31 ENCOUNTER — Ambulatory Visit (INDEPENDENT_AMBULATORY_CARE_PROVIDER_SITE_OTHER): Payer: Medicare Other | Admitting: Internal Medicine

## 2021-07-31 VITALS — BP 128/78 | HR 88 | Temp 97.6°F | Resp 14 | Ht 67.5 in | Wt 170.4 lb

## 2021-07-31 DIAGNOSIS — F439 Reaction to severe stress, unspecified: Secondary | ICD-10-CM | POA: Diagnosis not present

## 2021-07-31 DIAGNOSIS — M48061 Spinal stenosis, lumbar region without neurogenic claudication: Secondary | ICD-10-CM | POA: Diagnosis not present

## 2021-07-31 DIAGNOSIS — I77810 Thoracic aortic ectasia: Secondary | ICD-10-CM | POA: Diagnosis not present

## 2021-07-31 DIAGNOSIS — R0602 Shortness of breath: Secondary | ICD-10-CM | POA: Diagnosis not present

## 2021-07-31 DIAGNOSIS — R059 Cough, unspecified: Secondary | ICD-10-CM

## 2021-07-31 DIAGNOSIS — R030 Elevated blood-pressure reading, without diagnosis of hypertension: Secondary | ICD-10-CM | POA: Diagnosis not present

## 2021-07-31 DIAGNOSIS — E7800 Pure hypercholesterolemia, unspecified: Secondary | ICD-10-CM

## 2021-07-31 DIAGNOSIS — I5030 Unspecified diastolic (congestive) heart failure: Secondary | ICD-10-CM

## 2021-07-31 DIAGNOSIS — I4891 Unspecified atrial fibrillation: Secondary | ICD-10-CM

## 2021-07-31 LAB — HEPATIC FUNCTION PANEL
ALT: 28 U/L (ref 0–35)
AST: 37 U/L (ref 0–37)
Albumin: 3.8 g/dL (ref 3.5–5.2)
Alkaline Phosphatase: 102 U/L (ref 39–117)
Bilirubin, Direct: 0.5 mg/dL — ABNORMAL HIGH (ref 0.0–0.3)
Total Bilirubin: 1.6 mg/dL — ABNORMAL HIGH (ref 0.2–1.2)
Total Protein: 6.4 g/dL (ref 6.0–8.3)

## 2021-07-31 LAB — BASIC METABOLIC PANEL
BUN: 18 mg/dL (ref 6–23)
CO2: 33 mEq/L — ABNORMAL HIGH (ref 19–32)
Calcium: 9.6 mg/dL (ref 8.4–10.5)
Chloride: 99 mEq/L (ref 96–112)
Creatinine, Ser: 0.7 mg/dL (ref 0.40–1.20)
GFR: 78.81 mL/min (ref >60.00)
Glucose, Bld: 92 mg/dL (ref 70–99)
Potassium: 4 mEq/L (ref 3.5–5.1)
Sodium: 140 mEq/L (ref 135–145)

## 2021-07-31 LAB — TSH: TSH: 2.75 u[IU]/mL (ref 0.35–5.50)

## 2021-07-31 LAB — LIPID PANEL
Cholesterol: 142 mg/dL (ref 0–200)
HDL: 68.6 mg/dL (ref >39.00)
LDL Cholesterol: 63 mg/dL (ref 0–99)
NonHDL: 73.23
Total CHOL/HDL Ratio: 2
Triglycerides: 52 mg/dL (ref 0.0–149.0)
VLDL: 10.4 mg/dL (ref 0.0–40.0)

## 2021-07-31 NOTE — Progress Notes (Signed)
Patient ID: BAILYNN DYK, female   DOB: [birth year], 88 y.o.   MRN: [id number]   Subjective:    Patient ID: ESTHEFANY HERRIG, female    DOB: Mar 29, 1934, 88 y.o.   MRN: [id number]  This visit occurred during the SARS-CoV-2 public health emergency.  Safety protocols were in place, including screening questions prior to the visit, additional usage of staff PPE, and extensive cleaning of exam room while observing appropriate contact time as indicated for disinfecting solutions.   Patient here for a scheduled follow up.   Chief Complaint  Patient presents with   Shortness of Breath   Extremity Weakness   Atrial Fibrillation   .   HPI She is accompanied by her daughter.  History obtained from both of them. She was admitted to [facility] from 8/18 through 8/20 with acute HFpEF, A. fib with RVR of uncertain chronicity, and moderate pericardial effusion.  EKG demonstrated A. fib with RVR. Given moderate LVH, biatrial enlargement, and low voltage on EKG, infiltrative cardiomyopathy was recommended to be considered.  She responded well to IV diuresis.  With regards to her A. fib, she was rate controlled with beta-blocker and initiated on Xarelto. Since discharge, she has had intermittent adjustments of her lasix dosing.  She underwent successful DCCV without complication on 78/02/3809 following 1 synchronized cardioversion with postprocedure EKG demonstrating NSR.   Since undergoing DCCV, she has redeveloped A. fib with controlled ventricular response.  She is following her weight is varying.  The trend recently appears to be 165-170 pounds (February) vs around 160 pounds in January. She is more active than when first home from hospital.  Still has some sob with exertion - stating some days are better than others.  Discussed monitor sodium intake.  She has not been watching.  She also reports that two weeks ago, she had been on her feet a lot.  Started feeling bad and had to lower herself to the floor and then crawl to her  chair.  No fall.  States feels worse when just standing in one place for a long period of time.  Back and hip bother her - these are chronic problems.  Recently had losartan decreased.  No chest pain.  No nausea or vomiting.  Eating.  Occasionally will notice some cough with wheezing.  Taking lasix 40mg  q day.     Past Medical History:  Diagnosis Date   Hx of degenerative disc disease    Hypercholesterolemia    Neuromuscular disorder ([place])    pinched nerve   Osteoarthritis    Seasonal allergies    Spinal stenosis    chronic back and leg pain   Past Surgical History:  Procedure Laterality Date   APPENDECTOMY     bladder tack surgery  2000   BTL  1976   CARDIOVERSION N/A 05/11/2021   Procedure: CARDIOVERSION;  Surgeon: Minna Merritts, MD;  Location: [facility] ORS;  Service: Cardiovascular;  Laterality: N/A;   CARPAL TUNNEL RELEASE     CATARACT EXTRACTION W/PHACO Left 12/07/2015   Procedure: CATARACT EXTRACTION PHACO AND INTRAOCULAR LENS PLACEMENT ([place]);  Surgeon: Eulogio Bear, MD;  Location: [facility] ORS;  Service: Ophthalmology;  Laterality: Left;  [place] 1.23 AP% 23.2 CDE 19.28 Fluid Pack Lot # [id number]   EYE SURGERY  03/19/15   GANGLION CYST EXCISION     right hand   GAS/FLUID EXCHANGE Left 03/08/2015   Procedure: GAS/FLUID EXCHANGE;  Surgeon: Milus Height, MD;  Location: [facility] ORS;  Service: Ophthalmology;  Laterality:  Left;   TONSILLECTOMY     TUBAL LIGATION     VITRECTOMY 25 GAUGE WITH SCLERAL BUCKLE Left 03/08/2015   Procedure: VITRECTOMY 25 GAUGE, membrane peel, 26 %  SF6 gas exchange;  Surgeon: Milus Height, MD;  Location: [facility] ORS;  Service: Ophthalmology;  Laterality: Left;  casette lot # [id number]   Family History  Problem Relation Age of Onset   Diabetes Mother    Heart disease Father        Unknown age of onset. Unknown if had CHF or heart attack.   Leukemia Father    Breast cancer Sister        in her 58's   Cancer Sister        lung cancer   Thyroid cancer  Sister    Breast cancer Maternal Aunt    Colon cancer Neg Hx    Social History   Socioeconomic History   Marital status: Widowed    Spouse name: Not on file   Number of children: 5   Years of education: Not on file   Highest education level: Not on file  Occupational History   Not on file  Tobacco Use   Smoking status: Former   Smokeless tobacco: Never  Vaping Use   Vaping Use: Never used  Substance and Sexual Activity   Alcohol use: Yes    Alcohol/week: 0.0 standard drinks    Comment: occassional   Drug use: No   Sexual activity: Not on file  Other Topics Concern   Not on file  Social History Narrative   Not on file   Social Determinants of Health   Financial Resource Strain: Low Risk    Difficulty of Paying Living Expenses: Not hard at all  Food Insecurity: No Food Insecurity   Worried About Charity fundraiser in the Last Year: Never true   [place] in the Last Year: Never true  Transportation Needs: No Transportation Needs   Lack of Transportation (Medical): No   Lack of Transportation (Non-Medical): No  Physical Activity: Insufficiently Active   Days of Exercise per Week: 3 days   Minutes of Exercise per Session: 20 min  Stress: No Stress Concern Present   Feeling of Stress : Not at all  Social Connections: Unknown   Frequency of Communication with Friends and Family: More than three times a week   Frequency of Social Gatherings with Friends and Family: More than three times a week   Attends Religious Services: Not on Electrical engineer or Organizations: Not on file   Attends Archivist Meetings: Not on file   Marital Status: Not on file     Review of Systems  Constitutional:  Negative for appetite change and fever.       Weights reviewed and are attached.   HENT:  Negative for congestion and sinus pressure.   Respiratory:  Positive for shortness of breath. Negative for chest tightness.        Some minimal cough.    Cardiovascular:  Negative for chest pain and palpitations.       No significant lower extremity swelling today.   Gastrointestinal:  Negative for abdominal pain, diarrhea, nausea and vomiting.  Genitourinary:  Negative for difficulty urinating and dysuria.  Musculoskeletal:  Positive for back pain. Negative for joint swelling and myalgias.  Skin:  Negative for color change and rash.  Neurological:  Negative for dizziness, light-headedness and headaches.  Psychiatric/Behavioral:  Negative for agitation  and dysphoric mood.       Objective:     BP 128/78    Pulse 88 Comment: Fluating between 88-42 pulse OX   Temp 97.6 F (36.4 C) (Oral)    Resp 14    Ht 5' 7.5" (1.715 m)    Wt 170 lb 6 oz (77.3 kg)    LMP 06/26/1985    SpO2 95%    BMI 26.29 kg/m  Wt Readings from Last 3 Encounters:  07/31/21 170 lb 6 oz (77.3 kg)  05/18/21 164 lb (74.4 kg)  05/11/21 160 lb (72.6 kg)    Physical Exam Vitals reviewed.  Constitutional:      General: She is not in acute distress.    Appearance: Normal appearance.  HENT:     Head: Normocephalic and atraumatic.     Right Ear: External ear normal.     Left Ear: External ear normal.  Eyes:     General: No scleral icterus.       Right eye: No discharge.        Left eye: No discharge.     Conjunctiva/sclera: Conjunctivae normal.  Neck:     Thyroid: No thyromegaly.  Cardiovascular:     Rate and Rhythm: Normal rate. Rhythm irregular.  Pulmonary:     Effort: No respiratory distress.     Breath sounds: No wheezing.     Comments: Question of diminished breath sounds left lung base.  Abdominal:     General: Bowel sounds are normal.     Palpations: Abdomen is soft.     Tenderness: There is no abdominal tenderness.  Musculoskeletal:        General: No swelling or tenderness.     Cervical back: Neck supple. No tenderness.     Comments: No increased edema.    Lymphadenopathy:     Cervical: No cervical adenopathy.  Skin:    Findings: No erythema or  rash.  Neurological:     Mental Status: She is alert.  Psychiatric:        Mood and Affect: Mood normal.        Behavior: Behavior normal.     Outpatient Encounter Medications as of 07/31/2021  Medication Sig   acetaminophen (TYLENOL) 650 MG CR tablet Take 650 mg by mouth every 8 (eight) hours as needed for pain. Reported on 08/02/2015   furosemide (LASIX) 40 MG tablet Take 1-2 tablets q day (as instructed)   losartan (COZAAR) 25 MG tablet Take 1 tablet (25 mg total) by mouth daily.   metoprolol tartrate (LOPRESSOR) 50 MG tablet TAKE 1 TABLET(50 MG) BY MOUTH TWICE DAILY   Multiple Vitamin (MULTIVITAMIN) tablet Take 1 tablet by mouth daily.   OVER THE COUNTER MEDICATION Place 1 drop into both eyes every 8 (eight) hours as needed (dry eyes.). Lubricant eye drops   Polyethylene Glycol 3350 (MIRALAX PO) Take 17 g by mouth every 3 (three) days. Every 3 days   potassium chloride (KLOR-CON) 20 MEQ packet Take 20 mEq by mouth every other day.   potassium chloride SA (KLOR-CON M) 20 MEQ tablet TAKE 1 TABLET BY MOUTH DAILY; TAKE THIS MEDICATION WHILE TAKING LASIX   XARELTO 20 MG TABS tablet TAKE 1 TABLET(20 MG) BY MOUTH DAILY WITH SUPPER   metoprolol tartrate (LOPRESSOR) 50 MG tablet Take 1 tablet (50 mg total) by mouth 2 (two) times daily.   No facility-administered encounter medications on file as of 07/31/2021.     Lab Results  Component Value Date  WBC 5.5 05/18/2021   HGB 14.9 05/18/2021   HCT 45.3 05/18/2021   PLT 147 (L) 05/18/2021   GLUCOSE 92 07/31/2021   CHOL 142 07/31/2021   TRIG 52.0 07/31/2021   HDL 68.60 07/31/2021   LDLDIRECT 104.0 08/12/2017   LDLCALC 63 07/31/2021   ALT 28 07/31/2021   AST 37 07/31/2021   NA 140 07/31/2021   K 4.0 07/31/2021   CL 99 07/31/2021   CREATININE 0.70 07/31/2021   BUN 18 07/31/2021   CO2 33 (H) 07/31/2021   TSH 2.75 07/31/2021   INR 1.2 03/20/2021       Assessment & Plan:   Problem List Items Addressed This Visit     (HFpEF) heart  failure with preserved ejection fraction ([place])    Recently admitted with afib RVR.  CXR on admission revealed small bilateral pleural effusions with constellation of findings  favored to reflect pulmonary edema with bibasilar atelectasis.  Was placed on losartan and lasix.  She reports breathing overall is stable.  Some days better than others.  Some sob with walking to mailbox, walking down hall here at the clinic, etc. EKG = appears to be in afib with ventricular weight 84.  Weight as outlined.  Increase lasix to bid (with bid potassium) - for the next 3 days. Check metabolic panel.  Continue losartan at lower dose.  Check cxr.   ECHO 05/31/21 - moderate pericardial effusion.  Will f/u with cardiology with question of need for f/u ECHO or further cardiac w/up.  Discussed continuing to monitor weights.  Discussed if gains more than 3 pounds in one day or 5 pounds in one week - taking an extra lasix for that day.        Relevant Orders   Basic metabolic panel (Completed)   A-fib ([place]) - Primary    EKG as outlined.  Remains in afib.  On metoprolol and xarelto.  Follow.       Relevant Orders   EKG 12-Lead (Completed)   DG Chest 2 View   Ascending aorta dilatation ([place])    Per report was stable on most recent echo.  Continue adequate blood pressure control.  Follow.       Cough    Minimal cough - occasionally.  Exam as outlined.  Check cxr.  No increased congestion.  Increased lasix as outlined.  Follow.       Elevated blood pressure reading    Initial blood pressure taken after walking to exam room.  Recheck ok.  Follow.       Hypercholesterolemia    Low cholesterol diet and exercise.  Have discussed calculated cholesterol risk and recommendation to start cholesterol medication.  She declines.  Follow lipid panel.        Relevant Orders   TSH (Completed)   Lipid panel (Completed)   Hepatic function panel (Completed)   Lumbar spinal stenosis    Has known spinal stenosis.  Has seen Dr  Sharlet Salina.  S/p injections.  Stable.        Stress    Overall appears to be handling things relatively well.  Follow.       Other Visit Diagnoses     SOB (shortness of breath)       Relevant Orders   DG Chest 2 View       I spent 45 minutes with the patient and more than 50% of the time was spent in consultation regarding the above.  Time spent discussing her current concerns and  symptoms.  Time also spent discussing further w/up, evaluation and treatment.   Einar Pheasant, MD

## 2021-08-01 ENCOUNTER — Encounter: Payer: Self-pay | Admitting: Internal Medicine

## 2021-08-01 ENCOUNTER — Other Ambulatory Visit: Payer: Self-pay

## 2021-08-01 DIAGNOSIS — I77810 Thoracic aortic ectasia: Secondary | ICD-10-CM | POA: Insufficient documentation

## 2021-08-01 DIAGNOSIS — R5383 Other fatigue: Secondary | ICD-10-CM

## 2021-08-01 HISTORY — DX: Thoracic aortic ectasia: I77.810

## 2021-08-01 NOTE — Assessment & Plan Note (Signed)
Low cholesterol diet and exercise.  Have discussed calculated cholesterol risk and recommendation to start cholesterol medication.  She declines.  Follow lipid panel.   

## 2021-08-01 NOTE — Assessment & Plan Note (Signed)
Per report was stable on most recent echo.  Continue adequate blood pressure control.  Follow.  ?

## 2021-08-01 NOTE — Assessment & Plan Note (Signed)
Has known spinal stenosis.  Has seen Dr Chasnis.  S/p injections.  Stable.   

## 2021-08-01 NOTE — Assessment & Plan Note (Signed)
Overall appears to be handling things relatively well.  Follow.   

## 2021-08-01 NOTE — Assessment & Plan Note (Addendum)
Recently admitted with afib RVR.  CXR on admission revealed small bilateral pleural effusions with constellation of findings  favored to reflect pulmonary edema with bibasilar atelectasis.  Was placed on losartan and lasix.  She reports breathing overall is stable.  Some days better than others.  Some sob with walking to mailbox, walking down hall here at the clinic, etc. EKG = appears to be in afib with ventricular weight 84.  Weight as outlined.  Increase lasix to bid (with bid potassium) - for the next 3 days. Check metabolic panel.  Continue losartan at lower dose.  Check cxr.   ECHO 05/31/21 - moderate pericardial effusion.  Will f/u with cardiology with question of need for f/u ECHO or further cardiac w/up.  Discussed continuing to monitor weights.  Discussed if gains more than 3 pounds in one day or 5 pounds in one week - taking an extra lasix for that day.   ?

## 2021-08-01 NOTE — Assessment & Plan Note (Signed)
Minimal cough - occasionally.  Exam as outlined.  Check cxr.  No increased congestion.  Increased lasix as outlined.  Follow.  ?

## 2021-08-01 NOTE — Assessment & Plan Note (Signed)
Initial blood pressure taken after walking to exam room.  Recheck ok.  Follow.  ?

## 2021-08-01 NOTE — Assessment & Plan Note (Signed)
EKG as outlined.  Remains in afib.  On metoprolol and xarelto.  Follow.  ?

## 2021-08-04 ENCOUNTER — Other Ambulatory Visit: Payer: Self-pay | Admitting: Internal Medicine

## 2021-08-10 ENCOUNTER — Other Ambulatory Visit (INDEPENDENT_AMBULATORY_CARE_PROVIDER_SITE_OTHER): Payer: Medicare Other

## 2021-08-10 ENCOUNTER — Other Ambulatory Visit: Payer: Medicare Other

## 2021-08-10 ENCOUNTER — Other Ambulatory Visit: Payer: Self-pay | Admitting: Internal Medicine

## 2021-08-10 ENCOUNTER — Other Ambulatory Visit: Payer: Self-pay | Admitting: Family

## 2021-08-10 ENCOUNTER — Other Ambulatory Visit: Payer: Self-pay

## 2021-08-10 DIAGNOSIS — R5383 Other fatigue: Secondary | ICD-10-CM | POA: Diagnosis not present

## 2021-08-10 DIAGNOSIS — I5030 Unspecified diastolic (congestive) heart failure: Secondary | ICD-10-CM

## 2021-08-10 LAB — BASIC METABOLIC PANEL
BUN: 17 mg/dL (ref 7–25)
CO2: 30 mmol/L (ref 20–32)
Calcium: 9.6 mg/dL (ref 8.6–10.4)
Chloride: 100 mmol/L (ref 98–110)
Creat: 0.75 mg/dL (ref 0.60–0.95)
Glucose, Bld: 88 mg/dL (ref 65–99)
Potassium: 5 mmol/L (ref 3.5–5.3)
Sodium: 139 mmol/L (ref 135–146)

## 2021-08-10 LAB — CBC WITH DIFFERENTIAL/PLATELET
Basophils Absolute: 0 10*3/uL (ref 0.0–0.1)
Basophils Relative: 1 % (ref 0.0–3.0)
Eosinophils Absolute: 0.1 10*3/uL (ref 0.0–0.7)
Eosinophils Relative: 2.9 % (ref 0.0–5.0)
HCT: 44.6 % (ref 36.0–46.0)
Hemoglobin: 14.6 g/dL (ref 12.0–15.0)
Lymphocytes Relative: 32.9 % (ref 12.0–46.0)
Lymphs Abs: 1.5 10*3/uL (ref 0.7–4.0)
MCHC: 32.6 g/dL (ref 30.0–36.0)
MCV: 98.8 fl (ref 78.0–100.0)
Monocytes Absolute: 0.5 10*3/uL (ref 0.1–1.0)
Monocytes Relative: 10.6 % (ref 3.0–12.0)
Neutro Abs: 2.4 10*3/uL (ref 1.4–7.7)
Neutrophils Relative %: 52.6 % (ref 43.0–77.0)
Platelets: 120 10*3/uL — ABNORMAL LOW (ref 150.0–400.0)
RBC: 4.51 Mil/uL (ref 3.87–5.11)
RDW: 16.5 % — ABNORMAL HIGH (ref 11.5–15.5)
WBC: 4.6 10*3/uL (ref 4.0–10.5)

## 2021-08-10 LAB — HEPATIC FUNCTION PANEL
ALT: 28 U/L (ref 0–35)
AST: 37 U/L (ref 0–37)
Albumin: 3.6 g/dL (ref 3.5–5.2)
Alkaline Phosphatase: 99 U/L (ref 39–117)
Bilirubin, Direct: 0.6 mg/dL — ABNORMAL HIGH (ref 0.0–0.3)
Total Bilirubin: 1.8 mg/dL — ABNORMAL HIGH (ref 0.2–1.2)
Total Protein: 5.9 g/dL — ABNORMAL LOW (ref 6.0–8.3)

## 2021-08-10 NOTE — Progress Notes (Signed)
Order placed for f/u met b.  

## 2021-08-10 NOTE — Addendum Note (Signed)
Addended by: Azzie Almas on: 08/10/2021 05:00 PM ? ? Modules accepted: Orders ? ?

## 2021-08-11 NOTE — Telephone Encounter (Signed)
Rx ok'd for xarelto #30 with 3 refills.  ?

## 2021-08-13 ENCOUNTER — Other Ambulatory Visit: Payer: Self-pay

## 2021-08-13 ENCOUNTER — Other Ambulatory Visit: Payer: Self-pay | Admitting: Internal Medicine

## 2021-08-13 ENCOUNTER — Telehealth: Payer: Self-pay | Admitting: *Deleted

## 2021-08-13 DIAGNOSIS — I5031 Acute diastolic (congestive) heart failure: Secondary | ICD-10-CM

## 2021-08-13 DIAGNOSIS — I3139 Other pericardial effusion (noninflammatory): Secondary | ICD-10-CM

## 2021-08-13 NOTE — Progress Notes (Signed)
Order placed for abdominal ultrasound.   

## 2021-08-13 NOTE — Telephone Encounter (Signed)
-----   Message from Rise Mu, PA-C sent at 08/13/2021  2:17 PM EDT ----- ?Please schedule limited echo for evaluation of pericardial effusion.  ? ?

## 2021-08-14 ENCOUNTER — Telehealth: Payer: Self-pay | Admitting: Internal Medicine

## 2021-08-14 NOTE — Telephone Encounter (Signed)
Lft pt vm to call ofc to sch appt for [place]. thanks ?

## 2021-08-15 NOTE — Telephone Encounter (Signed)
Limited echo has been scheduled.  ?

## 2021-08-17 ENCOUNTER — Telehealth: Payer: Self-pay | Admitting: Internal Medicine

## 2021-08-17 ENCOUNTER — Other Ambulatory Visit: Payer: Self-pay

## 2021-08-17 ENCOUNTER — Telehealth: Payer: Self-pay | Admitting: *Deleted

## 2021-08-17 ENCOUNTER — Ambulatory Visit (INDEPENDENT_AMBULATORY_CARE_PROVIDER_SITE_OTHER): Payer: Medicare Other

## 2021-08-17 DIAGNOSIS — I3139 Other pericardial effusion (noninflammatory): Secondary | ICD-10-CM

## 2021-08-17 LAB — ECHOCARDIOGRAM LIMITED
Calc EF: 30.6 %
S' Lateral: 3.2 cm
Single Plane A2C EF: 29.3 %
Single Plane A4C EF: 32.5 %

## 2021-08-17 NOTE — Telephone Encounter (Signed)
Copied from [place] [id number]. Topic: Medicare AWV ?>> Aug 17, 2021  9:57 AM Harris-Coley, Hannah Beat wrote: ?Reason for CRM: LVM 3/17//23 AWV appt time changed from 9a to 845am khc.  Please confirm khc ?

## 2021-08-17 NOTE — Telephone Encounter (Signed)
-----   Message from Rise Mu, PA-C sent at 08/17/2021  3:10 PM EDT ----- ?Echo showed normal pump function, normal wall motion, severe thickening of the heart, normal pump function of the right side of the heart, elevated pressure within the right side of the heart estimated at 50.3 mmHg, biatrial dilatation, mildly to moderately leaky mitral valve, moderately leaky tricuspid valve, and a known moderate pericardial effusion without evidence of tamponade.  ? ?When compared to prior echo, the pump function remains normal, thickening of the heart is unchanged, pressure in the right side of the heart has increased, stable leakiness of the mitral and tricuspid valves, and a stable moderate pericardial effusion.  ? ?Assessment: ?-No indication for pericardiocentesis at this time ?-When I saw her in December, she was taking furosemide 40 mg daily ?-I suspect we need to run her on the drier side given her pulmonary hypertension, this may also help her pericardial effusion ?-There has also been some concern and discussion regarding possible infiltrative cardiomyopathy, though patient and daughter have preferred to defer work-up of this given the patient's advanced age, we will revisit this in follow-up next week ? ?Recommendations: ?-Please have her increase Lasix to 40 mg twice daily until I see her on 3/22 ?-Given high normal potassium on recent check, we will defer escalation of KCl at this time ?-If her renal function remains stable on higher dose Lasix, we may need to keep her on that dose ?-Discussed with reading cardiologist ?-I will CC her PCP just as an FYI ?

## 2021-08-17 NOTE — Telephone Encounter (Signed)
Patient calling to discuss recent testing results  ° °Please call  ° °

## 2021-08-17 NOTE — Telephone Encounter (Signed)
Left voicemail message to call back for review of results.  

## 2021-08-19 ENCOUNTER — Telehealth: Payer: Self-pay | Admitting: Internal Medicine

## 2021-08-19 NOTE — Telephone Encounter (Signed)
-----   Message from Rise Mu, PA-C sent at 08/13/2021  2:11 PM EDT ----- ?Regarding: RE: update and follow up. ?Dr. Nicki Reaper,  ? ?Thanks for the update. I agree that an echo to re-evaluate her pericardial effusion is indicated at this time. Looks like her CXR was stable so no indication for thoracentesis. I will get our office to get her scheduled for an echo. I will talk with her and her daughter on 3/22 about their interest in pursuing further cardiac testing, cMRI for evaluation of infiltrative cardiomyopathy vs Lexiscan MPI. ? ?Ryan ? ? ?----- Message ----- ?From: Einar Pheasant, MD ?Sent: 08/12/2021   8:29 PM EDT ?To: Rise Mu, PA-C ?Subject: update and follow up.                         ? ?I recently saw Ms Blackson for a routine follow up.  She has a history of HFpEF, afib and pericardial effusion.  S/p DCCV.  Has had issues with persistent intermittent episodes of SOB and DOE.  We discussed daily weights and monitoring sodium intake.  She was experiencing some increased sob with exertion her last visit, weight overall appears to be up compared to a month ago.  I had her double up her lasix for a few days and recommended her to continue to monitor her weight, etc - and to call with update.  After reviewing her chart, I would like to get your input regarding further cardiac evaluation.  I was not sure if a f/u echo was warranted, given the pericardial effusion.  I can schedule her an appointment to follow up if needed.  Thank you for seeing her and helping take care of her.   ? ?Einar Pheasant ? ? ?

## 2021-08-20 ENCOUNTER — Ambulatory Visit: Payer: Medicare Other

## 2021-08-20 ENCOUNTER — Ambulatory Visit (INDEPENDENT_AMBULATORY_CARE_PROVIDER_SITE_OTHER): Payer: Medicare Other

## 2021-08-20 VITALS — Ht 67.5 in | Wt 160.1 lb

## 2021-08-20 DIAGNOSIS — Z0000 Encounter for general adult medical examination without abnormal findings: Secondary | ICD-10-CM

## 2021-08-20 NOTE — Patient Instructions (Addendum)
?  Deborah Baxter , ?Thank you for taking time to come for your Medicare Wellness Visit. I appreciate your ongoing commitment to your health goals. Please review the following plan we discussed and let me know if I can assist you in the future.  ? ?These are the goals we discussed: ? Goals   ? ?  ? Patient Stated  ?   Maintain Healthy Lifestyle (pt-stated)   ?   Stay hydrated ?Stay active ?Healthy diet ?  ? ?  ?  ?This is a list of the screening recommended for you and due dates:  ?Health Maintenance  ?Topic Date Due  ? Mammogram  05/30/2021  ? Flu Shot  08/31/2021*  ? COVID-19 Vaccine (4 - Booster for Moderna series) 12/06/2021*  ? Tetanus Vaccine  08/21/2022*  ? Pneumonia Vaccine  Completed  ? DEXA scan (bone density measurement)  Completed  ? Zoster (Shingles) Vaccine  Completed  ? HPV Vaccine  Aged Out  ?*Topic was postponed. The date shown is not the original due date.  ?  ?

## 2021-08-20 NOTE — Progress Notes (Signed)
Subjective:   Deborah Baxter is a 88 y.o. female who presents for Medicare Annual (Subsequent) preventive examination.  Review of Systems    No ROS.  Medicare Wellness Virtual Visit.  Visual/audio telehealth visit, UTA vital signs.   See social history for additional risk factors.   Cardiac Risk Factors include: advanced age (>80men, >52 women);hypertension     Objective:    Today's Vitals   08/20/21 1115  Weight: 160 lb 1.6 oz (72.6 kg)  Height: 5' 7.5" (1.715 m)   Body mass index is 24.71 kg/m.  Advanced Directives 08/20/2021 03/20/2021 08/17/2020 08/17/2019 08/14/2018 08/12/2017 08/02/2016  Does Patient Have a Medical Advance Directive? Yes No Yes Yes Yes Yes Yes  Type of Youth worker of [place];Living will Healthcare Power of [place];Living will Healthcare Power of [employer] Power of Attorney Living will;Healthcare Power of Attorney  Does patient want to make changes to medical advance directive? No - Patient declined - No - Patient declined No - Patient declined No - Patient declined No - Patient declined -  Copy of Healthcare Power of Attorney in Chart? No - copy requested - Yes - validated most recent copy scanned in chart (See row information) Yes - validated most recent copy scanned in chart (See row information) Yes - validated most recent copy scanned in chart (See row information) No - copy requested No - copy requested  Would patient like information on creating a medical advance directive? - No - Patient declined - - - - -    Current Medications (verified) Outpatient Encounter Medications as of 08/20/2021  Medication Sig   acetaminophen (TYLENOL) 650 MG CR tablet Take 650 mg by mouth every 8 (eight) hours as needed for pain. Reported on 08/02/2015   furosemide (LASIX) 40 MG tablet TAKE 1 TO 2 TABLETS BY MOUTH EVERY DAY AS DIRECTED   metoprolol tartrate (LOPRESSOR) 50 MG tablet TAKE 1 TABLET(50 MG) BY MOUTH TWICE  DAILY   Multiple Vitamin (MULTIVITAMIN) tablet Take 1 tablet by mouth daily.   OVER THE COUNTER MEDICATION Place 1 drop into both eyes every 8 (eight) hours as needed (dry eyes.). Lubricant eye drops   Polyethylene Glycol 3350 (MIRALAX PO) Take 17 g by mouth every 3 (three) days. Every 3 days   potassium chloride SA (KLOR-CON M) 20 MEQ tablet TAKE 1 TABLET BY MOUTH DAILY; TAKE THIS MEDICATION WHILE TAKING LASIX   rivaroxaban (XARELTO) 20 MG TABS tablet TAKE 1 TABLET(20 MG) BY MOUTH DAILY WITH SUPPER   losartan (COZAAR) 25 MG tablet Take 1 tablet (25 mg total) by mouth daily.   metoprolol tartrate (LOPRESSOR) 50 MG tablet Take 1 tablet (50 mg total) by mouth 2 (two) times daily.   potassium chloride (KLOR-CON) 20 MEQ packet Take 20 mEq by mouth every other day.   No facility-administered encounter medications on file as of 08/20/2021.    Allergies (verified) Quinolones   History: Past Medical History:  Diagnosis Date   Hx of degenerative disc disease    Hypercholesterolemia    Neuromuscular disorder (HCC)    pinched nerve   Osteoarthritis    Seasonal allergies    Spinal stenosis    chronic back and leg pain   Past Surgical History:  Procedure Laterality Date   APPENDECTOMY     bladder Baxter surgery  2000   BTL  1976   CARDIOVERSION N/A 05/11/2021   Procedure: CARDIOVERSION;  Surgeon: Antonieta Iba, MD;  Location: [facility] ORS;  Service: Cardiovascular;  Laterality: N/A;   CARPAL TUNNEL RELEASE     CATARACT EXTRACTION W/PHACO Left 12/07/2015   Procedure: CATARACT EXTRACTION PHACO AND INTRAOCULAR LENS PLACEMENT (IOC);  Surgeon: Nevada Crane, MD;  Location: [facility] ORS;  Service: Ophthalmology;  Laterality: Left;  [place] 1.23 AP% 23.2 CDE 19.28 Fluid Pack Lot # [id number]   EYE SURGERY  03/19/15   GANGLION CYST EXCISION     right hand   GAS/FLUID EXCHANGE Left 03/08/2015   Procedure: GAS/FLUID EXCHANGE;  Surgeon: Marcelene Butte, MD;  Location: [facility] ORS;  Service: Ophthalmology;   Laterality: Left;   TONSILLECTOMY     TUBAL LIGATION     VITRECTOMY 25 GAUGE WITH SCLERAL BUCKLE Left 03/08/2015   Procedure: VITRECTOMY 25 GAUGE, membrane peel, 26 %  SF6 gas exchange;  Surgeon: Marcelene Butte, MD;  Location: [facility] ORS;  Service: Ophthalmology;  Laterality: Left;  casette lot # [id number]   Family History  Problem Relation Age of Onset   Diabetes Mother    Heart disease Father        Unknown age of onset. Unknown if had CHF or heart attack.   Leukemia Father    Breast cancer Sister        in her 21's   Cancer Sister        lung cancer   Thyroid cancer Sister    Breast cancer Maternal Aunt    Colon cancer Neg Hx    Social History   Socioeconomic History   Marital status: Widowed    Spouse name: Not on file   Number of children: 5   Years of education: Not on file   Highest education level: Not on file  Occupational History   Not on file  Tobacco Use   Smoking status: Former   Smokeless tobacco: Never  Vaping Use   Vaping Use: Never used  Substance and Sexual Activity   Alcohol use: Yes    Alcohol/week: 0.0 standard drinks    Comment: occassional   Drug use: No   Sexual activity: Not on file  Other Topics Concern   Not on file  Social History Narrative   Not on file   Social Determinants of Health   Financial Resource Strain: Low Risk    Difficulty of Paying Living Expenses: Not hard at all  Food Insecurity: No Food Insecurity   Worried About Programme researcher, broadcasting/film/video in the Last Year: Never true   Ran Out of Food in the Last Year: Never true  Transportation Needs: No Transportation Needs   Lack of Transportation (Medical): No   Lack of Transportation (Non-Medical): No  Physical Activity: Not on file  Stress: No Stress Concern Present   Feeling of Stress : Not at all  Social Connections: Unknown   Frequency of Communication with Friends and Family: More than three times a week   Frequency of Social Gatherings with Friends and Family: More than  three times a week   Attends Religious Services: More than 4 times per year   Active Member of [employer] or Organizations: Not on file   Attends Banker Meetings: Not on file   Marital Status: Widowed    Tobacco Counseling Counseling given: Not Answered   Clinical Intake:  Pre-visit preparation completed: Yes        Diabetes: No  How often do you need to have someone help you when you read instructions, pamphlets, or other written materials from your doctor or pharmacy?: 1 - Never  Interpreter Needed?: No      Activities of Daily Living In your present state of health, do you have any difficulty performing the following activities: 08/20/2021 05/11/2021  Hearing? N N  Vision? N N  Difficulty concentrating or making decisions? N N  Walking or climbing stairs? Y N  Comment Cane in use -  Dressing or bathing? N N  Doing errands, shopping? N -  Preparing Food and eating ? N -  Using the Toilet? N -  In the past six months, have you accidently leaked urine? Y -  Comment Managed with daily pad -  Do you have problems with loss of bowel control? N -  Managing your Medications? N -  Comment Daughter assist as needed. Pill box in use. -  Managing your Finances? N -  Comment Daughter assist as needed -  Housekeeping or managing your Housekeeping? N -  Some recent data might be hidden    Patient Care Team: [place], MD as PCP - General (Internal Medicine) End, Cristal Deer, MD as PCP - Cardiology (Cardiology)  Indicate any recent Medical Services you may have received from other than [facility] providers in the past year (date may be approximate).     Assessment:   This is a routine wellness examination for [place].  Virtual Visit via Telephone Note  I connected with  Deborah Baxter on 08/20/21 at 11:15 AM EDT by telephone and verified that I am speaking with the correct person using two identifiers.  Persons participating in the virtual visit: patient/Nurse  Health Advisor   I discussed the limitations of evaluation and management service by telehealth.  The patient expressed understanding and agreed to proceed. We continued and completed visit with audio only.  Some vital signs may be absent or patient reported.    Hearing/Vision screen Hearing Screening - Comments:: Patient is able to hear conversational tones without difficulty. No issues reported. Vision Screening - Comments:: Follow[facility] Wears corrective lenses  Cataract extraction, L eye only  Dietary issues and exercise activities discussed: Current Exercise Habits: Home exercise routine, Intensity: Mild Healthy diet Good water intake   Goals Addressed   None    Depression Screen PHQ 2/9 Scores 08/20/2021 07/31/2021 04/23/2021 11/30/2020 08/17/2020 08/17/2019 08/14/2018  PHQ - 2 Score 0 0 0 0 0 0 0    Fall Risk Fall Risk  07/31/2021 04/23/2021 11/30/2020 08/17/2020 08/17/2019  Falls in the past year? 1 0 1 0 0  Number falls in past yr: 0 0 0 0 -  Injury with Fall? 0 0 0 0 -  Risk for fall due to : Impaired balance/gait;Impaired mobility;History of fall(s) - - - -  Follow up Falls evaluation completed;Falls prevention discussed Falls evaluation completed Falls evaluation completed Falls evaluation completed Falls evaluation completed    FALL RISK PREVENTION PERTAINING TO THE HOME: Home free of loose throw rugs in walkways, pet beds, electrical cords, etc? Yes  Adequate lighting in your home to reduce risk of falls? Yes   ASSISTIVE DEVICES UTILIZED TO PREVENT FALLS: Life alert? No  Use of a cane, walker or w/c? Yes  Grab bars in the bathroom? Yes Shower chair or bench in shower? Yes  Elevated toilet seat or a handicapped toilet? No   TIMED UP AND GO: Was the test performed? No .   Cognitive Function: Patient is alert and oriented x3.  MMSE - Mini Mental State Exam 08/12/2017 08/02/2016 08/02/2015  Orientation to time 5 5 5  Orientation to Place 5 5 5    Registration 3 3 3   Attention/ Calculation 5 5 5   Recall 3 3 3   Language- name 2 objects 2 2 2   Language- repeat 1 1 1   Language- follow 3 step command 3 3 3   Language- read & follow direction 1 1 1   Write a sentence 1 1 1   Copy design 1 1 1   Total score 30 30 30      6CIT Screen 08/17/2020 08/17/2019 08/14/2018  What Year? 0 points 0 points 0 points  What month? 0 points 0 points 0 points  What time? 0 points 0 points 0 points  Count back from 20 0 points 0 points 0 points  Months in reverse 0 points 0 points 0 points  Repeat phrase 0 points - 0 points  Total Score 0 - 0   Immunizations Immunization History  Administered Date(s) Administered   Influenza Split 02/15/2014   Influenza, High Dose Seasonal PF 03/05/2018, 03/16/2019   Influenza-Unspecified 03/04/2012, 03/24/2013, 03/08/2014, 02/20/2015, 03/05/2016, 03/11/2017, 03/15/2019, 04/24/2020   Moderna Sars-Covid-2 Vaccination 06/25/2019, 07/23/2019, 05/08/2020   Pneumococcal Conjugate-13 07/08/2013   Pneumococcal Polysaccharide-23 07/12/2014   Zoster Recombinat (Shingrix) 04/13/2019, 09/10/2019   TDAP status: Due, Education has been provided regarding the importance of this vaccine. Advised may receive this vaccine at local pharmacy or Health Dept. Aware to provide a copy of the vaccination record if obtained from local pharmacy or Health Dept. Verbalized acceptance and understanding. Deferred.   Screening Tests Health Maintenance  Topic Date Due   MAMMOGRAM  05/30/2021   INFLUENZA VACCINE  08/31/2021 (Originally 01/01/2021)   COVID-19 Vaccine (4 - Booster for Moderna series) 12/06/2021 (Originally 07/03/2020)   TETANUS/TDAP  08/21/2022 (Originally 01/22/1953)   Pneumonia Vaccine 6+ Years old  Completed   DEXA SCAN  Completed   Zoster Vaccines- Shingrix  Completed   HPV VACCINES  Aged Out   Health Maintenance Health Maintenance Due  Topic Date Due   MAMMOGRAM  05/30/2021   Mammogram- deferred per patient. Prefers to  discuss with PCP at upcoming visit in  10/2021. No change or issue with breast at this time   Lung Cancer Screening: (Low Dose CT Chest recommended if Age 46-80 years, 30 pack-year currently smoking OR have quit w/in 15years.) does not qualify.   Hepatitis C Screening: does not qualify  Vision Screening: Recommended annual ophthalmology exams for early detection of glaucoma and other disorders of the eye.  Dental Screening: Recommended annual dental exams for proper oral hygiene  Community Resource Referral / Chronic Care Management: CRR required this visit?  No   CCM required this visit?  No      Plan:   Keep all routine maintenance appointments.   I have personally reviewed and noted the following in the patient's chart:   Medical and social history Use of alcohol, tobacco or illicit drugs  Current medications and supplements including opioid prescriptions.  Functional ability and status Nutritional status Physical activity Advanced directives List of other physicians Hospitalizations, surgeries, and ER visits in previous 12 months Vitals Screenings to include cognitive, depression, and falls Referrals and appointments  In addition, I have reviewed and discussed with patient certain preventive protocols, quality metrics, and best practice recommendations. A written personalized care plan for preventive services as well as general preventive health recommendations were provided to patient via mychart.     Ashok Pall, LPN   1/61/0960

## 2021-08-20 NOTE — Progress Notes (Signed)
? ?Cardiology Office Note   ? ?Date:  08/22/2021  ? ?ID:  Deborah Baxter, DOB 09/10/1933, MRN [id number] ? ?PCP:  Einar Pheasant, MD  ?Cardiologist:  Nelva Bush, MD  ?Electrophysiologist:  None  ? ?Chief Complaint: Follow-up ? ?History of Present Illness:  ? ?Deborah Baxter is a 88 y.o. female with history of A. fib of uncertain chronicity though at least persistent status post briefly successful DCCV on 05/11/2021, HFpEF, possible infiltrative cardiomyopathy, pericardial effusion, pleural effusion, syncope, HTN, HLD, osteoarthritis, prior tobacco use, and spinal stenosis who presents for follow-up of A. fib and HFpEF. ?  ?Prior ED evaluation for syncope in 11/2020 with negative orthostatics and largely unrevealing labs.  Cardiac enzymes not cycled.  EKG unavailable for review. ?  ?She was admitted to [facility] fin 01/2021 with acute HFpEF, A. fib with RVR of uncertain chronicity (initially noted to be in A-fib in 03/2021), and moderate pericardial effusion.  EKG demonstrated A. fib with RVR, 121 bpm, low voltage QRS, and nonspecific ST-T changes.  High-sensitivity troponin of 28 with a delta of 17.  BNP 335.  Echo demonstrated a low normal LV systolic function with an EF of 50 to 55%, no regional wall motion abnormalities, moderate LVH, LV diastolic function parameters could not be evaluated, mildly reduced RV systolic function with normal ventricular cavity size, normal PASP, moderate biatrial enlargement, moderate pericardial effusion that was posterior to the LV without evidence of cardiac tamponade, degenerative mitral valve with moderate regurgitation and moderate to severe mitral annular calcification, moderate tricuspid regurgitation, tricuspid aortic valve with trivial insufficiency and mild aortic valve sclerosis without evidence of stenosis, mildly dilated ascending aorta measuring 41 mm, and an estimated right atrial pressure of 8 mmHg.  Given moderate LVH, biatrial enlargement, and low voltage on EKG,  infiltrative cardiomyopathy was recommended to be considered.  She responded well to IV diuresis.  With regards to her A. fib, she was rate controlled with beta-blocker and initiated on Xarelto. ?  ?She was seen in hospital follow-up on 03/30/2021 and was doing reasonably well without tachypalpitations and a noted improvement in her energy.  She still noted some exertional shortness of breath.  She reported adherence to medications and was without symptoms of bleeding.  She remained in A. fib with a ventricular rate of 94 bpm on EKG.  She was felt to be still mildly volume up with recommendation to increase furosemide to 80 mg for 3 days followed by 40 mg daily there after along with increasing KCl to 40 mEq for 3 days followed by 20 mill equivalent daily thereafter.  Subsequent repeat limited echo on 05/02/2021 demonstrated an EF of 50%, moderate LVH, normal RV systolic function and ventricular cavity size, stable moderate pericardial effusion that was posterior and lateral to the left ventricle without evidence of cardiac tamponade, moderate pleural effusion in the left lateral region, mild mitral regurgitation, trivial aortic insufficiency, mildly dilated ascending aorta measuring 41 mm, and an estimated right atrial pressure of 8 mmHg. ?  ?She was evaluated by her PCP in late 04/2021 with URI-like symptoms.  Lasix was briefly increased to 40 mg twice daily for 3 days per family.  Chest x-ray showed chronic scarring within the left lung base and no new focal abnormality. ?  ?She was seen in follow-up on 05/04/2021 and reported her breathing was largely stable, though did continue to note exertional dyspnea.  Her URI symptoms were improved.  She was without chest pain or palpitations.  Her weights had  trended from 163 to 169 pounds.  She was taking furosemide 40 mg daily.  She remained in rate controlled A. fib and had not missed any doses of anticoagulation.  No bleeding concerns.  Her Lasix was titrated to 80 mg  daily for 5 days followed by 40 mg daily thereafter.  She underwent successful DCCV without complication on 79/0/2409 following 1 synchronized cardioversion at 150 J with postprocedure EKG demonstrating NSR. ? ?She was last seen in the on 05/18/2021 and was doing well from a cardiac perspective.  She had redeveloped A-fib with controlled ventricular response.  She was asymptomatic regarding this.  Her weight was down 6 pounds by our scale.  Functional status had improved.  Given she was asymptomatic, and in the context of her age, rate control strategy was pursued.  With regards to her possible infiltrative cardiomyopathy, patient and family preferred to defer further evaluation of this.  Repeat limited echo on 05/31/2021 demonstrated an EF of 60 to 65%, no regional wall motion abnormalities, severe concentric LVH, indeterminate LV diastolic function parameters, normal RV systolic function and ventricular cavity size, mildly elevated PASP estimated at 38.6 mmHg, moderate biatrial enlargement, moderate pericardial effusion posterior to the LV without evidence of tamponade, mild to moderate mitral regurgitation, moderate mitral annular calcification, moderate tricuspid regurgitation, mild aortic insufficiency, aortic valve sclerosis without evidence of stenosis, mildly dilated ascending aorta measuring 40 mm, and a rhythm of A-fib/flutter. ? ?She was evaluated by her PCP in late 07/2021 with an increase in shortness of breath.  Her weight was up 2 pounds at that time but compared to her visit in 04/2021.  Lasix was briefly titrated.  Chest x-ray showed chronic left base scarring and possible chronic small pleural effusion without new consolidation.  There was also diffuse chronic interstitial coarsening.  Overall, findings were stable when compared to prior plain film. ? ?She underwent repeat limited echo on 08/17/2021 which showed an EF of 55 to 60%, no regional wall motion abnormalities, severe LVH, indeterminate LV  diastolic function parameters, normal RV systolic function and ventricular cavity size, moderately elevated PASP estimated at 50.3 mmHg, moderate biatrial enlargement, mild to moderate mitral regurgitation, moderate tricuspid regurgitation, and estimated right atrial pressure of 15 mmHg, and a stable moderate pericardial effusion without evidence of tamponade.  Given these findings, it was recommended she increase her furosemide to 40 mg twice daily and follow-up today. ? ?She comes in accompanied by a family member today and is doing well from a cardiac perspective.  She does note intermittent shortness of breath when ambulating to the mailbox,, though feels that symptoms are largely stable.  She denies any symptoms of angina or decompensation she reports since doubling her Lasix her weight has improved.  Weight today is noted to be stable when compared to her last visit in our office.  She is watching her salt intake and drinking less than 2 L of fluid per day.  No orthopnea.  No dizziness, presyncope, or syncope.  No lower extremity swelling, abdominal distention, or early satiety.  No falls, hematochezia, or melena.  She is tolerating her medications without issues.  She continues to defer further cardiac testing.  Overall, she is pleased with her current status. ? ? ?Labs independently reviewed: ?08/2018 - BUN 17, serum creatinine 0.75, potassium 5.0, albumin 3.6, AST/ALT normal, Hgb 14.6, PLT 120 ?07/2021 - TC 142, TG 52, HDL 68, LDL 63, TSH normal ? ?Past Medical History:  ?Diagnosis Date  ? Hx of degenerative disc  disease   ? Hypercholesterolemia   ? Neuromuscular disorder ([place])   ? pinched nerve  ? Osteoarthritis   ? Seasonal allergies   ? Spinal stenosis   ? chronic back and leg pain  ? ? ?Past Surgical History:  ?Procedure Laterality Date  ? APPENDECTOMY    ? bladder tack surgery  2000  ? BTL  1976  ? CARDIOVERSION N/A 05/11/2021  ? Procedure: CARDIOVERSION;  Surgeon: Minna Merritts, MD;  Location: [facility]  ORS;  Service: Cardiovascular;  Laterality: N/A;  ? CARPAL TUNNEL RELEASE    ? CATARACT EXTRACTION W/PHACO Left 12/07/2015  ? Procedure: CATARACT EXTRACTION PHACO AND INTRAOCULAR LENS PLACEMENT (IOC);  Surgeon: Perley Jain

## 2021-08-20 NOTE — Telephone Encounter (Signed)
Spoke with patient and reviewed provider will discuss her results at her appointment on Wednesday 08/22/21. Instructed her to take her Furosemide (Lasix) 40 mg twice a day until her appointment. She takes her fluid pill at night instead of the morning. Discussed her regimen and how taking it earlier would help prevent her from being up and down all night. She was agreeable to take it twice a day but states it is difficult for her to take and be out and about during the day. She verbalized understanding of our conversation with no further questions at this time.  ?

## 2021-08-21 DIAGNOSIS — M9903 Segmental and somatic dysfunction of lumbar region: Secondary | ICD-10-CM | POA: Diagnosis not present

## 2021-08-21 DIAGNOSIS — M5136 Other intervertebral disc degeneration, lumbar region: Secondary | ICD-10-CM | POA: Diagnosis not present

## 2021-08-21 DIAGNOSIS — M9905 Segmental and somatic dysfunction of pelvic region: Secondary | ICD-10-CM | POA: Diagnosis not present

## 2021-08-21 DIAGNOSIS — M955 Acquired deformity of pelvis: Secondary | ICD-10-CM | POA: Diagnosis not present

## 2021-08-22 ENCOUNTER — Ambulatory Visit (INDEPENDENT_AMBULATORY_CARE_PROVIDER_SITE_OTHER): Payer: Medicare Other | Admitting: Physician Assistant

## 2021-08-22 ENCOUNTER — Other Ambulatory Visit: Payer: Self-pay | Admitting: *Deleted

## 2021-08-22 ENCOUNTER — Other Ambulatory Visit
Admission: RE | Admit: 2021-08-22 | Discharge: 2021-08-22 | Disposition: A | Discharge status: Home / Self Care | Payer: Medicare Other | Source: Ambulatory Visit | Attending: Physician Assistant | Admitting: Physician Assistant

## 2021-08-22 ENCOUNTER — Other Ambulatory Visit: Payer: Self-pay

## 2021-08-22 ENCOUNTER — Encounter: Payer: Self-pay | Admitting: Physician Assistant

## 2021-08-22 VITALS — BP 110/80 | HR 85 | Ht 67.5 in | Wt 164.4 lb

## 2021-08-22 DIAGNOSIS — I340 Nonrheumatic mitral (valve) insufficiency: Secondary | ICD-10-CM

## 2021-08-22 DIAGNOSIS — Z79899 Other long term (current) drug therapy: Secondary | ICD-10-CM | POA: Insufficient documentation

## 2021-08-22 DIAGNOSIS — I351 Nonrheumatic aortic (valve) insufficiency: Secondary | ICD-10-CM | POA: Diagnosis not present

## 2021-08-22 DIAGNOSIS — I4819 Other persistent atrial fibrillation: Secondary | ICD-10-CM

## 2021-08-22 DIAGNOSIS — I3139 Other pericardial effusion (noninflammatory): Secondary | ICD-10-CM | POA: Diagnosis not present

## 2021-08-22 DIAGNOSIS — I5032 Chronic diastolic (congestive) heart failure: Secondary | ICD-10-CM | POA: Diagnosis not present

## 2021-08-22 DIAGNOSIS — I10 Essential (primary) hypertension: Secondary | ICD-10-CM | POA: Diagnosis not present

## 2021-08-22 LAB — BASIC METABOLIC PANEL
Anion gap: 6 (ref 5–15)
BUN: 26 mg/dL — ABNORMAL HIGH (ref 8–23)
CO2: 34 mmol/L — ABNORMAL HIGH (ref 22–32)
Calcium: 9.5 mg/dL (ref 8.9–10.3)
Chloride: 99 mmol/L (ref 98–111)
Creatinine, Ser: 0.7 mg/dL (ref 0.44–1.00)
GFR, Estimated: >60 mL/min (ref >60)
Glucose, Bld: 95 mg/dL (ref 70–99)
Potassium: 4.2 mmol/L (ref 3.5–5.1)
Sodium: 139 mmol/L (ref 135–145)

## 2021-08-22 MED ORDER — FUROSEMIDE 40 MG PO TABS: 40.0000 mg | ORAL_TABLET | Freq: Every day | ORAL | 3 refills | Status: DC

## 2021-08-22 NOTE — Patient Instructions (Addendum)
Medication Instructions:  ?- Your physician has recommended you make the following change in your medication:  ? ?1) HOLD lasix (furosemide) today ?- we will call you with dosing once your lab results are received ? ?*If you need a refill on your cardiac medications before your next appointment, please call your pharmacy* ? ? ?Lab Work: ?- Your physician recommends that you have lab work today: BMP ? ?[facility] Entrance at [facility] ?1st desk on the right to check in- REGISTRATION ? ? ?If you have labs (blood work) drawn today and your tests are completely normal, you will receive your results only by: ?MyChart Message (if you have MyChart) OR ?A paper copy in the mail ?If you have any lab test that is abnormal or we need to change your treatment, we will call you to review the results. ? ? ?Testing/Procedures: ?- none ordered ? ? ?Follow-Up: ?At [facility], you and your health needs are our priority.  As part of our continuing mission to provide you with exceptional heart care, we have created designated Provider Care Teams.  These Care Teams include your primary Cardiologist (physician) and Advanced Practice Providers (APPs -  Physician Assistants and Nurse Practitioners) who all work together to provide you with the care you need, when you need it. ? ?We recommend signing up for the patient portal called "MyChart".  Sign up information is provided on this After Visit Summary.  MyChart is used to connect with patients for Virtual Visits (Telemedicine).  Patients are able to view lab/test results, encounter notes, upcoming appointments, etc.  Non-urgent messages can be sent to your provider as well.   ?To learn more about what you can do with MyChart, go to [email or web address].   ? ?Your next appointment:   ?2 month(s) ? ?The format for your next appointment:   ?In Person ? ?Provider:   ?You may see Nelva Bush, MD or one of the following Advanced Practice Providers on your designated Care Team:   ? ?Christell Faith, PA-C ? ? ?:1} ?Other Instructions ?N/a ? ?

## 2021-08-29 ENCOUNTER — Other Ambulatory Visit: Payer: Self-pay

## 2021-08-29 ENCOUNTER — Ambulatory Visit
Admission: RE | Admit: 2021-08-29 | Discharge: 2021-08-29 | Disposition: A | Discharge status: Home / Self Care | Payer: Medicare Other | Source: Ambulatory Visit | Attending: Internal Medicine | Admitting: Internal Medicine

## 2021-08-29 DIAGNOSIS — J90 Pleural effusion, not elsewhere classified: Secondary | ICD-10-CM | POA: Diagnosis not present

## 2021-09-03 ENCOUNTER — Other Ambulatory Visit (INDEPENDENT_AMBULATORY_CARE_PROVIDER_SITE_OTHER): Payer: Medicare Other

## 2021-09-03 DIAGNOSIS — I5031 Acute diastolic (congestive) heart failure: Secondary | ICD-10-CM

## 2021-09-03 LAB — BASIC METABOLIC PANEL
BUN: 15 mg/dL (ref 6–23)
CO2: 32 mEq/L (ref 19–32)
Calcium: 9.4 mg/dL (ref 8.4–10.5)
Chloride: 101 mEq/L (ref 96–112)
Creatinine, Ser: 0.63 mg/dL (ref 0.40–1.20)
GFR: 80.78 mL/min (ref >60.00)
Glucose, Bld: 133 mg/dL — ABNORMAL HIGH (ref 70–99)
Potassium: 4.4 mEq/L (ref 3.5–5.1)
Sodium: 138 mEq/L (ref 135–145)

## 2021-09-03 LAB — CBC WITH DIFFERENTIAL/PLATELET
Basophils Absolute: 0 10*3/uL (ref 0.0–0.1)
Basophils Relative: 0.8 % (ref 0.0–3.0)
Eosinophils Absolute: 0.3 10*3/uL (ref 0.0–0.7)
Eosinophils Relative: 5.4 % — ABNORMAL HIGH (ref 0.0–5.0)
HCT: 42.4 % (ref 36.0–46.0)
Hemoglobin: 14 g/dL (ref 12.0–15.0)
Lymphocytes Relative: 24.5 % (ref 12.0–46.0)
Lymphs Abs: 1.3 10*3/uL (ref 0.7–4.0)
MCHC: 33 g/dL (ref 30.0–36.0)
MCV: 99.4 fl (ref 78.0–100.0)
Monocytes Absolute: 0.5 10*3/uL (ref 0.1–1.0)
Monocytes Relative: 9.8 % (ref 3.0–12.0)
Neutro Abs: 3.1 10*3/uL (ref 1.4–7.7)
Neutrophils Relative %: 59.5 % (ref 43.0–77.0)
Platelets: 121 10*3/uL — ABNORMAL LOW (ref 150.0–400.0)
RBC: 4.26 Mil/uL (ref 3.87–5.11)
RDW: 15.5 % (ref 11.5–15.5)
WBC: 5.3 10*3/uL (ref 4.0–10.5)

## 2021-09-03 LAB — HEPATIC FUNCTION PANEL
ALT: 26 U/L (ref 0–35)
AST: 43 U/L — ABNORMAL HIGH (ref 0–37)
Albumin: 3.3 g/dL — ABNORMAL LOW (ref 3.5–5.2)
Alkaline Phosphatase: 96 U/L (ref 39–117)
Bilirubin, Direct: 0.7 mg/dL — ABNORMAL HIGH (ref 0.0–0.3)
Total Bilirubin: 2.1 mg/dL — ABNORMAL HIGH (ref 0.2–1.2)
Total Protein: 6.1 g/dL (ref 6.0–8.3)

## 2021-09-04 ENCOUNTER — Telehealth: Payer: Self-pay

## 2021-09-04 NOTE — Telephone Encounter (Signed)
-----   Message from Einar Pheasant, MD sent at 09/04/2021  5:09 AM EDT ----- ?Bilirubin remains elevated.  Platelet count remains slightly decreased.  Kidney function and hgb wnl. No liver abnormality noted on ultrasound - see ultrasound result note.  See if agreeable to schedule an appt with me to discuss labs, ultrasound and update on clinical symptoms.  Ok if virtual (if difficult getting to office).   ?

## 2021-09-04 NOTE — Telephone Encounter (Signed)
Lm for pt to cb re: results ?

## 2021-09-05 ENCOUNTER — Other Ambulatory Visit: Payer: Self-pay | Admitting: Internal Medicine

## 2021-09-05 DIAGNOSIS — J90 Pleural effusion, not elsewhere classified: Secondary | ICD-10-CM

## 2021-09-05 NOTE — Progress Notes (Signed)
Order placed for cxr.  

## 2021-09-11 DIAGNOSIS — M9905 Segmental and somatic dysfunction of pelvic region: Secondary | ICD-10-CM | POA: Diagnosis not present

## 2021-09-11 DIAGNOSIS — M5136 Other intervertebral disc degeneration, lumbar region: Secondary | ICD-10-CM | POA: Diagnosis not present

## 2021-09-11 DIAGNOSIS — M9903 Segmental and somatic dysfunction of lumbar region: Secondary | ICD-10-CM | POA: Diagnosis not present

## 2021-09-11 DIAGNOSIS — M955 Acquired deformity of pelvis: Secondary | ICD-10-CM | POA: Diagnosis not present

## 2021-09-12 ENCOUNTER — Encounter: Payer: Self-pay | Admitting: Internal Medicine

## 2021-09-12 ENCOUNTER — Ambulatory Visit (INDEPENDENT_AMBULATORY_CARE_PROVIDER_SITE_OTHER): Payer: Medicare Other | Admitting: Internal Medicine

## 2021-09-12 ENCOUNTER — Other Ambulatory Visit: Payer: Medicare Other

## 2021-09-12 ENCOUNTER — Ambulatory Visit (INDEPENDENT_AMBULATORY_CARE_PROVIDER_SITE_OTHER): Payer: Medicare Other

## 2021-09-12 VITALS — BP 102/68 | HR 65 | Temp 97.0°F | Ht 67.5 in | Wt 150.4 lb

## 2021-09-12 DIAGNOSIS — I4891 Unspecified atrial fibrillation: Secondary | ICD-10-CM | POA: Diagnosis not present

## 2021-09-12 DIAGNOSIS — M48061 Spinal stenosis, lumbar region without neurogenic claudication: Secondary | ICD-10-CM

## 2021-09-12 DIAGNOSIS — I5030 Unspecified diastolic (congestive) heart failure: Secondary | ICD-10-CM

## 2021-09-12 DIAGNOSIS — J90 Pleural effusion, not elsewhere classified: Secondary | ICD-10-CM

## 2021-09-12 DIAGNOSIS — I77810 Thoracic aortic ectasia: Secondary | ICD-10-CM | POA: Diagnosis not present

## 2021-09-12 DIAGNOSIS — E7800 Pure hypercholesterolemia, unspecified: Secondary | ICD-10-CM

## 2021-09-12 DIAGNOSIS — D696 Thrombocytopenia, unspecified: Secondary | ICD-10-CM

## 2021-09-12 DIAGNOSIS — R17 Unspecified jaundice: Secondary | ICD-10-CM | POA: Diagnosis not present

## 2021-09-12 DIAGNOSIS — L600 Ingrowing nail: Secondary | ICD-10-CM

## 2021-09-12 LAB — HEPATIC FUNCTION PANEL
ALT: 27 U/L (ref 0–35)
AST: 31 U/L (ref 0–37)
Albumin: 3.4 g/dL — ABNORMAL LOW (ref 3.5–5.2)
Alkaline Phosphatase: 112 U/L (ref 39–117)
Bilirubin, Direct: 0.4 mg/dL — ABNORMAL HIGH (ref 0.0–0.3)
Total Bilirubin: 1.1 mg/dL (ref 0.2–1.2)
Total Protein: 5.8 g/dL — ABNORMAL LOW (ref 6.0–8.3)

## 2021-09-12 LAB — BASIC METABOLIC PANEL
BUN: 22 mg/dL (ref 6–23)
CO2: 33 mEq/L — ABNORMAL HIGH (ref 19–32)
Calcium: 9.3 mg/dL (ref 8.4–10.5)
Chloride: 103 mEq/L (ref 96–112)
Creatinine, Ser: 0.58 mg/dL (ref 0.40–1.20)
GFR: 82.39 mL/min (ref >60.00)
Glucose, Bld: 76 mg/dL (ref 70–99)
Potassium: 4.7 mEq/L (ref 3.5–5.1)
Sodium: 141 mEq/L (ref 135–145)

## 2021-09-12 NOTE — Progress Notes (Addendum)
Patient ID: Deborah Baxter, female   DOB: December 24, 1933, 88 y.o.   MRN: [id number] ? ? ?Subjective:  ? ? Patient ID: Deborah Baxter, female    DOB: 21-Jul-1933, 88 y.o.   MRN: [id number] ? ?This visit occurred during the SARS-CoV-2 public health emergency.  Safety protocols were in place, including screening questions prior to the visit, additional usage of staff PPE, and extensive cleaning of exam room while observing appropriate contact time as indicated for disinfecting solutions.  ? ?Patient here for work in appt.  ? ?Chief Complaint  ?Patient presents with  ? Follow-up  ?  Discuss labs & have chest xray  ? .  ? ?HPI ?She is accompanied by her daughter.  History obtained from both of them.  Discussed recent labs.  Elevated bilirubin.  Platelet count decreased.  Hgb wnl.  Abdominal ultrasound obtained to evaluate spleen, etc.  Ultrasound revealed - liver wnl, spleen normal size, did note bilateral pleural effusions.  Is being followed by cardiology for HFpEF and afib.  Reports her breathing is stable.  She is able to walk out to the mailbox without increased sob.  Does have to stop and rest at times with increased activity, but overall feels breathing stable.  No chest pain reported.  Taking lasix daily.  Denies any light headedness or dizziness.  Eating.  Weight is down.  Reviewed outside weights and in March weight averaged in the 157 - 167 range.  Reviewing April weights - 147-157 with most recent home weight - 146-147.  No nausea or vomiting.  Some constipation.  Takes miralax q third day.  Discussed increasing to qod.  Concern regarding ingrown toe nail.  Discussed podiatry referral.  ? ? ?Past Medical History:  ?Diagnosis Date  ? Hx of degenerative disc disease   ? Hypercholesterolemia   ? Neuromuscular disorder ([place])   ? pinched nerve  ? Osteoarthritis   ? Seasonal allergies   ? Spinal stenosis   ? chronic back and leg pain  ? ?Past Surgical History:  ?Procedure Laterality Date  ? APPENDECTOMY    ? bladder tack  surgery  2000  ? BTL  1976  ? CARDIOVERSION N/A 05/11/2021  ? Procedure: CARDIOVERSION;  Surgeon: Minna Merritts, MD;  Location: [facility] ORS;  Service: Cardiovascular;  Laterality: N/A;  ? CARPAL TUNNEL RELEASE    ? CATARACT EXTRACTION W/PHACO Left 12/07/2015  ? Procedure: CATARACT EXTRACTION PHACO AND INTRAOCULAR LENS PLACEMENT (IOC);  Surgeon: Eulogio Bear, MD;  Location: [facility] OR[facility];  Service: Ophthalmology;  Laterality: Left;  [place] 1.23 ?AP% 23.2 ?CDE 19.28 ?Fluid Pack Lot # [id number]  ? EYE SURGERY  03/19/15  ? GANGLION CYST EXCISION    ? right hand  ? GAS/FLUID EXCHANGE Left 03/08/2015  ? Procedure: GAS/FLUID EXCHANGE;  Surgeon: Milus Height, MD;  Location: [facility];  Service: Ophthalmology;  Laterality: Left;  ? TONSILLECTOMY    ? TUBAL LIGATION    ? VITRECTOMY 25 GAUGE WITH SCLERAL BUCKLE Left 03/08/2015  ? Procedure: VITRECTOMY 25 GAUGE, membrane peel, 26 %  SF6 gas exchange;  Surgeon: Milus Height, MD;  Location: [facility];  Service: Ophthalmology;  Laterality: Left;  casette lot # [id number]  ? ?Family History  ?Problem Relation Age of Onset  ? Diabetes Mother   ? Heart disease Father   ?     Unknown age of onset. Unknown if had CHF or heart attack.  ? Leukemia Father   ? Breast cancer Sister   ?  in her 44's  ? Cancer Sister   ?     lung cancer  ? Thyroid cancer Sister   ? Breast cancer Maternal Aunt   ? Colon cancer Neg Hx   ? ?Social History  ? ?Socioeconomic History  ? Marital status: Widowed  ?  Spouse name: Not on file  ? Number of children: 5  ? Years of education: Not on file  ? Highest education level: Not on file  ?Occupational History  ? Not on file  ?Tobacco Use  ? Smoking status: Former  ? Smokeless tobacco: Never  ?Vaping Use  ? Vaping Use: Never used  ?Substance and Sexual Activity  ? Alcohol use: Yes  ?  Alcohol/week: 0.0 standard drinks  ?  Comment: occassional  ? Drug use: No  ? Sexual activity: Not on file  ?Other Topics Concern  ? Not on file  ?Social History Narrative  ? Not on file   ? ?Social Determinants of Health  ? ?Financial Resource Strain: Low Risk   ? Difficulty of Paying Living Expenses: Not hard at all  ?Food Insecurity: No Food Insecurity  ? Worried About Charity fundraiser in the Last Year: Never true  ? Ran Out of Food in the Last Year: Never true  ?Transportation Needs: No Transportation Needs  ? Lack of Transportation (Medical): No  ? Lack of Transportation (Non-Medical): No  ?Physical Activity: Not on file  ?Stress: No Stress Concern Present  ? Feeling of Stress : Not at all  ?Social Connections: Unknown  ? Frequency of Communication with Friends and Family: More than three times a week  ? Frequency of Social Gatherings with Friends and Family: More than three times a week  ? Attends Religious Services: More than 4 times per year  ? Active Member of Clubs or Organizations: Not on file  ? Attends Archivist Meetings: Not on file  ? Marital Status: Widowed  ? ? ? ?Review of Systems  ?Constitutional:  Negative for appetite change.  ?     Decreased weight as outlined.   ?HENT:  Negative for congestion and sinus pressure.   ?Respiratory:  Negative for cough and chest tightness.   ?     Breathing stable.   ?Cardiovascular:  Negative for chest pain and palpitations.  ?     No increased leg swelling.   ?Gastrointestinal:  Positive for constipation. Negative for abdominal pain, diarrhea, nausea and vomiting.  ?Genitourinary:  Negative for difficulty urinating and dysuria.  ?Musculoskeletal:  Positive for back pain. Negative for joint swelling and myalgias.  ?Skin:  Negative for color change and rash.  ?Neurological:  Negative for dizziness, light-headedness and headaches.  ?Psychiatric/Behavioral:  Negative for agitation and dysphoric mood.   ? ?   ?Objective:  ?  ? ?BP 102/68   Pulse 65   Temp (!) 97 ?F (36.1 ?C) (Axillary)   Ht 5' 7.5" (1.715 m)   Wt 150 lb 6.4 oz (68.2 kg)   LMP 06/26/1985   SpO2 96%   BMI 23.21 kg/m?  ?Wt Readings from Last 3 Encounters:  ?09/12/21  150 lb 6.4 oz (68.2 kg)  ?08/22/21 164 lb 6 oz (74.6 kg)  ?08/20/21 160 lb 1.6 oz (72.6 kg)  ? ? ?Physical Exam ?Vitals reviewed.  ?Constitutional:   ?   General: She is not in acute distress. ?   Appearance: Normal appearance.  ?HENT:  ?   Head: Normocephalic and atraumatic.  ?   Right Ear: External  ear normal.  ?   Left Ear: External ear normal.  ?Eyes:  ?   General: No scleral icterus.    ?   Right eye: No discharge.     ?   Left eye: No discharge.  ?   Conjunctiva/sclera: Conjunctivae normal.  ?Neck:  ?   Thyroid: No thyromegaly.  ?Cardiovascular:  ?   Rate and Rhythm: Normal rate.  ?   Comments: Rate controlled.  ?Pulmonary:  ?   Effort: No respiratory distress.  ?   Breath sounds: Normal breath sounds. No wheezing.  ?Abdominal:  ?   General: Bowel sounds are normal.  ?   Palpations: Abdomen is soft.  ?   Tenderness: There is no abdominal tenderness.  ?Musculoskeletal:     ?   General: No swelling or tenderness.  ?   Cervical back: Neck supple. No tenderness.  ?Lymphadenopathy:  ?   Cervical: No cervical adenopathy.  ?Skin: ?   Findings: No erythema or rash.  ?Neurological:  ?   Mental Status: She is alert.  ?Psychiatric:     ?   Mood and Affect: Mood normal.     ?   Behavior: Behavior normal.  ? ? ? ?Outpatient Encounter Medications as of 09/12/2021  ?Medication Sig  ? acetaminophen (TYLENOL) 650 MG CR tablet Take 650 mg by mouth every 8 (eight) hours as needed for pain. Reported on 08/02/2015  ? furosemide (LASIX) 40 MG tablet Take 1 tablet (40 mg total) by mouth daily. TAKE 1 TO 2 TABLETS BY MOUTH EVERY DAY AS DIRECTED  ? losartan (COZAAR) 25 MG tablet Take 1 tablet (25 mg total) by mouth daily.  ? metoprolol tartrate (LOPRESSOR) 50 MG tablet Take 1 tablet (50 mg total) by mouth 2 (two) times daily.  ? Multiple Vitamin (MULTIVITAMIN) tablet Take 1 tablet by mouth daily.  ? OVER THE COUNTER MEDICATION Place 1 drop into both eyes every 8 (eight) hours as needed (dry eyes.). Lubricant eye drops  ? Polyethylene  Glycol 3350 (MIRALAX PO) Take 17 g by mouth every 3 (three) days. Every 3 days  ? rivaroxaban (XARELTO) 20 MG TABS tablet TAKE 1 TABLET(20 MG) BY MOUTH DAILY WITH SUPPER  ? potassium chloride (KLOR-CON) 20 MEQ packe

## 2021-09-13 ENCOUNTER — Encounter: Payer: Self-pay | Admitting: Internal Medicine

## 2021-09-13 DIAGNOSIS — L600 Ingrowing nail: Secondary | ICD-10-CM

## 2021-09-13 DIAGNOSIS — J90 Pleural effusion, not elsewhere classified: Secondary | ICD-10-CM

## 2021-09-13 DIAGNOSIS — R17 Unspecified jaundice: Secondary | ICD-10-CM

## 2021-09-13 DIAGNOSIS — D696 Thrombocytopenia, unspecified: Secondary | ICD-10-CM | POA: Insufficient documentation

## 2021-09-13 HISTORY — DX: Ingrowing nail: L60.0

## 2021-09-13 HISTORY — DX: Thrombocytopenia, unspecified: D69.6

## 2021-09-13 HISTORY — DX: Unspecified jaundice: R17

## 2021-09-13 HISTORY — DX: Pleural effusion, not elsewhere classified: J90

## 2021-09-13 NOTE — Assessment & Plan Note (Signed)
Ingrown toenail.  Pain.  Left second toe.  Request referral to podiatry.  ?

## 2021-09-13 NOTE — Assessment & Plan Note (Signed)
Noted on recent abdominal ultrasound.  Breathing is better.  Taking lasix daily.  Check cxr.  Discussed possible etiologies.  Discussed possible need for referral to pulmonary.  ?

## 2021-09-13 NOTE — Assessment & Plan Note (Signed)
Continues on metoprolol and xarelto.  Stable. Follow.  

## 2021-09-13 NOTE — Assessment & Plan Note (Signed)
Recent ultrasound revealed spleen size - wnl.  hgb wnl.  Recheck cbc today.  ?

## 2021-09-13 NOTE — Assessment & Plan Note (Signed)
Low cholesterol diet and exercise.  Have discussed calculated cholesterol risk and recommendation to start cholesterol medication.  She declines.  Follow lipid panel.   

## 2021-09-13 NOTE — Assessment & Plan Note (Signed)
Has known spinal stenosis.  Has seen Dr Sharlet Salina.  S/p injections.  Discussed increased pain today.  Will notify me if desires referral back.   ?

## 2021-09-13 NOTE — Assessment & Plan Note (Addendum)
Breathing overall improved.  On lasix daily.  Per note and discussion today, she is taking metoprolol and losartan '25mg'$  q day.  Weight has decreased.  No evidence of volume overload today on exam.  Blood pressure low.  Will need to clarify medications taking.  Check metabolic panel to confirm renal function remaining stable.  Confirm f/u with cardiology. With documented pleural effusions on ultrasound.  Recheck cxr.  ?

## 2021-09-13 NOTE — Assessment & Plan Note (Signed)
Per report was stable on most recent echo.  Continue adequate blood pressure control.  Follow.  ?

## 2021-09-13 NOTE — Assessment & Plan Note (Signed)
Elevated bilirubin on recent lab check.  Recheck liver panel today.  Recent ultrasound - no liver or gallbladder (or ductal) abnormality.   ?

## 2021-09-13 NOTE — Addendum Note (Signed)
Addended by: Alisa Graff on: 09/13/2021 04:53 AM ? ? Modules accepted: Orders ? ?

## 2021-09-25 ENCOUNTER — Ambulatory Visit (INDEPENDENT_AMBULATORY_CARE_PROVIDER_SITE_OTHER): Payer: Medicare Other | Admitting: Podiatry

## 2021-09-25 ENCOUNTER — Encounter: Payer: Self-pay | Admitting: Podiatry

## 2021-09-25 DIAGNOSIS — B351 Tinea unguium: Secondary | ICD-10-CM | POA: Diagnosis not present

## 2021-09-25 DIAGNOSIS — M79674 Pain in right toe(s): Secondary | ICD-10-CM | POA: Diagnosis not present

## 2021-09-25 DIAGNOSIS — M79675 Pain in left toe(s): Secondary | ICD-10-CM

## 2021-09-28 NOTE — Progress Notes (Signed)
?  Subjective:  ?Patient ID: Deborah Baxter, female    DOB: Mar 18, 1934,  MRN: [id number] ? ?Chief Complaint  ?Patient presents with  ? Nail Problem  ? ?88 y.o. female returns for the above complaint.  Patient presents with thickened elongated dystrophic toenails x10.  Mild pain on palpation.  Patient states that she is not able to do it herself.  She would like for me to debride down.  She denies any other acute complaints ? ?Objective:  ?There were no vitals filed for this visit. ?Podiatric Exam: ?Vascular: dorsalis pedis and posterior tibial pulses are palpable bilateral. Capillary return is immediate. Temperature gradient is WNL. Skin turgor WNL  ?Sensorium: Normal Semmes Weinstein monofilament test. Normal tactile sensation bilaterally. ?Nail Exam: Pt has thick disfigured discolored nails with subungual debris noted bilateral entire nail hallux through fifth toenails.  Pain on palpation to the nails. ?Ulcer Exam: There is no evidence of ulcer or pre-ulcerative changes or infection. ?Orthopedic Exam: Muscle tone and strength are WNL. No limitations in general ROM. No crepitus or effusions noted.  ?Skin: No Porokeratosis. No infection or ulcers ? ? ? ?Assessment & Plan:  ? ?1. Pain due to onychomycosis of toenails of both feet   ? ? ?Patient was evaluated and treated and all questions answered. ? ?Onychomycosis with pain  ?-Nails palliatively debrided as below. ?-Educated on self-care ? ?Procedure: Nail Debridement ?Rationale: pain  ?Type of Debridement: manual, sharp debridement. ?Instrumentation: Nail nipper, rotary burr. ?Number of Nails: 10 ? ?Procedures and Treatment: Consent by patient was obtained for treatment procedures. The patient understood the discussion of treatment and procedures well. All questions were answered thoroughly reviewed. Debridement of mycotic and hypertrophic toenails, 1 through 5 bilateral and clearing of subungual debris. No ulceration, no infection noted.  ?Return Visit-Office  Procedure: Patient instructed to return to the office for a follow up visit 3 months for continued evaluation and treatment. ? ?Boneta Lucks, DPM ?  ? ?Return in about 3 months (around 12/25/2021) for mayer . ? ?

## 2021-10-02 ENCOUNTER — Ambulatory Visit (INDEPENDENT_AMBULATORY_CARE_PROVIDER_SITE_OTHER): Payer: Medicare Other | Admitting: Internal Medicine

## 2021-10-02 ENCOUNTER — Encounter: Payer: Self-pay | Admitting: Internal Medicine

## 2021-10-02 VITALS — BP 100/56 | HR 62 | Temp 97.6°F | Resp 12 | Ht 67.0 in | Wt 140.0 lb

## 2021-10-02 DIAGNOSIS — I5030 Unspecified diastolic (congestive) heart failure: Secondary | ICD-10-CM

## 2021-10-02 DIAGNOSIS — J90 Pleural effusion, not elsewhere classified: Secondary | ICD-10-CM

## 2021-10-02 DIAGNOSIS — M9905 Segmental and somatic dysfunction of pelvic region: Secondary | ICD-10-CM | POA: Diagnosis not present

## 2021-10-02 DIAGNOSIS — M9903 Segmental and somatic dysfunction of lumbar region: Secondary | ICD-10-CM | POA: Diagnosis not present

## 2021-10-02 DIAGNOSIS — L600 Ingrowing nail: Secondary | ICD-10-CM

## 2021-10-02 DIAGNOSIS — R0981 Nasal congestion: Secondary | ICD-10-CM

## 2021-10-02 DIAGNOSIS — D696 Thrombocytopenia, unspecified: Secondary | ICD-10-CM | POA: Diagnosis not present

## 2021-10-02 DIAGNOSIS — I77810 Thoracic aortic ectasia: Secondary | ICD-10-CM | POA: Diagnosis not present

## 2021-10-02 DIAGNOSIS — R17 Unspecified jaundice: Secondary | ICD-10-CM

## 2021-10-02 DIAGNOSIS — M955 Acquired deformity of pelvis: Secondary | ICD-10-CM | POA: Diagnosis not present

## 2021-10-02 DIAGNOSIS — M5136 Other intervertebral disc degeneration, lumbar region: Secondary | ICD-10-CM | POA: Diagnosis not present

## 2021-10-02 DIAGNOSIS — M48061 Spinal stenosis, lumbar region without neurogenic claudication: Secondary | ICD-10-CM

## 2021-10-02 DIAGNOSIS — I4891 Unspecified atrial fibrillation: Secondary | ICD-10-CM | POA: Diagnosis not present

## 2021-10-02 DIAGNOSIS — E7800 Pure hypercholesterolemia, unspecified: Secondary | ICD-10-CM | POA: Diagnosis not present

## 2021-10-02 LAB — CBC WITH DIFFERENTIAL/PLATELET
Basophils Absolute: 0 10*3/uL (ref 0.0–0.1)
Basophils Relative: 0.9 % (ref 0.0–3.0)
Eosinophils Absolute: 0.4 10*3/uL (ref 0.0–0.7)
Eosinophils Relative: 7.6 % — ABNORMAL HIGH (ref 0.0–5.0)
HCT: 43.3 % (ref 36.0–46.0)
Hemoglobin: 14.2 g/dL (ref 12.0–15.0)
Lymphocytes Relative: 37 % (ref 12.0–46.0)
Lymphs Abs: 1.9 10*3/uL (ref 0.7–4.0)
MCHC: 32.9 g/dL (ref 30.0–36.0)
MCV: 97.9 fl (ref 78.0–100.0)
Monocytes Absolute: 0.5 10*3/uL (ref 0.1–1.0)
Monocytes Relative: 9.2 % (ref 3.0–12.0)
Neutro Abs: 2.3 10*3/uL (ref 1.4–7.7)
Neutrophils Relative %: 45.3 % (ref 43.0–77.0)
Platelets: 130 10*3/uL — ABNORMAL LOW (ref 150.0–400.0)
RBC: 4.42 Mil/uL (ref 3.87–5.11)
RDW: 13.8 % (ref 11.5–15.5)
WBC: 5.1 10*3/uL (ref 4.0–10.5)

## 2021-10-02 LAB — BASIC METABOLIC PANEL
BUN: 22 mg/dL (ref 6–23)
CO2: 34 mEq/L — ABNORMAL HIGH (ref 19–32)
Calcium: 9.6 mg/dL (ref 8.4–10.5)
Chloride: 101 mEq/L (ref 96–112)
Creatinine, Ser: 1.05 mg/dL (ref 0.40–1.20)
GFR: 48.39 mL/min — ABNORMAL LOW (ref >60.00)
Glucose, Bld: 156 mg/dL — ABNORMAL HIGH (ref 70–99)
Potassium: 4.3 mEq/L (ref 3.5–5.1)
Sodium: 140 mEq/L (ref 135–145)

## 2021-10-02 NOTE — Progress Notes (Signed)
? ?Subjective:  ? ? Patient ID: Deborah Baxter, female    DOB: 04-18-1934, 88 y.o.   MRN: [id number] ? ?This visit occurred during the SARS-CoV-2 public health emergency.  Safety protocols were in place, including screening questions prior to the visit, additional usage of staff PPE, and extensive cleaning of exam room while observing appropriate contact time as indicated for disinfecting solutions.  ? ?Patient here for a scheduled follow up.   ? ?HPI ?She is accompanied by her daughter.  History obtained from both of them.  Still with some sob with exertion, but has improved.  Weight is continuing to trend down.  Reviewed outside weights.  Reviewing end of April weights - mostly 140-145.  May - 130s.  (Previous weights 150 range).  She is eating.  No vomiting reported.  No abdominal pain.  Bowels moving.  Increased back pain.  Has known spinal stenosis.  S/p injections.  Has seen Dr Sharlet Salina previously.  Discussed returning to help with pain control.   ? ? ?Past Medical History:  ?Diagnosis Date  ? Hx of degenerative disc disease   ? Hypercholesterolemia   ? Neuromuscular disorder ([place])   ? pinched nerve  ? Osteoarthritis   ? Seasonal allergies   ? Spinal stenosis   ? chronic back and leg pain  ? ?Past Surgical History:  ?Procedure Laterality Date  ? APPENDECTOMY    ? bladder tack surgery  2000  ? BTL  1976  ? CARDIOVERSION N/A 05/11/2021  ? Procedure: CARDIOVERSION;  Surgeon: Minna Merritts, MD;  Location: [facility];  Service: Cardiovascular;  Laterality: N/A;  ? CARPAL TUNNEL RELEASE    ? CATARACT EXTRACTION W/PHACO Left 12/07/2015  ? Procedure: CATARACT EXTRACTION PHACO AND INTRAOCULAR LENS PLACEMENT (IOC);  Surgeon: Eulogio Bear, MD;  Location: [facility];  Service: Ophthalmology;  Laterality: Left;  [place] 1.23 ?AP% 23.2 ?CDE 19.28 ?Fluid Pack Lot # [id number]  ? EYE SURGERY  03/19/15  ? GANGLION CYST EXCISION    ? right hand  ? GAS/FLUID EXCHANGE Left 03/08/2015  ? Procedure: GAS/FLUID EXCHANGE;  Surgeon: Milus Height, MD;  Location: [facility] OR[facility];  Service: Ophthalmology;  Laterality: Left;  ? TONSILLECTOMY    ? TUBAL LIGATION    ? VITRECTOMY 25 GAUGE WITH SCLERAL BUCKLE Left 03/08/2015  ? Procedure: VITRECTOMY 25 GAUGE, membrane peel, 26 %  SF6 gas exchange;  Surgeon: Milus Height, MD;  Location: [facility];  Service: Ophthalmology;  Laterality: Left;  casette lot # [id number]  ? ?Family History  ?Problem Relation Age of Onset  ? Diabetes Mother   ? Heart disease Father   ?     Unknown age of onset. Unknown if had CHF or heart attack.  ? Leukemia Father   ? Breast cancer Sister   ?     in her 62's  ? Cancer Sister   ?     lung cancer  ? Thyroid cancer Sister   ? Breast cancer Maternal Aunt   ? Colon cancer Neg Hx   ? ?Social History  ? ?Socioeconomic History  ? Marital status: Widowed  ?  Spouse name: Not on file  ? Number of children: 5  ? Years of education: Not on file  ? Highest education level: Not on file  ?Occupational History  ? Not on file  ?Tobacco Use  ? Smoking status: Former  ? Smokeless tobacco: Never  ?Vaping Use  ? Vaping Use: Never used  ?Substance and Sexual Activity  ? Alcohol  use: Yes  ?  Alcohol/week: 0.0 standard drinks  ?  Comment: occassional  ? Drug use: No  ? Sexual activity: Not on file  ?Other Topics Concern  ? Not on file  ?Social History Narrative  ? Not on file  ? ?Social Determinants of Health  ? ?Financial Resource Strain: Low Risk   ? Difficulty of Paying Living Expenses: Not hard at all  ?Food Insecurity: No Food Insecurity  ? Worried About Charity fundraiser in the Last Year: Never true  ? Ran Out of Food in the Last Year: Never true  ?Transportation Needs: No Transportation Needs  ? Lack of Transportation (Medical): No  ? Lack of Transportation (Non-Medical): No  ?Physical Activity: Not on file  ?Stress: No Stress Concern Present  ? Feeling of Stress : Not at all  ?Social Connections: Unknown  ? Frequency of Communication with Friends and Family: More than three times a week  ? Frequency  of Social Gatherings with Friends and Family: More than three times a week  ? Attends Religious Services: More than 4 times per year  ? Active Member of Clubs or Organizations: Not on file  ? Attends Archivist Meetings: Not on file  ? Marital Status: Widowed  ? ? ? ?Review of Systems  ?Constitutional:  Negative for fever.  ?     Weight as outlined   ?HENT:  Negative for congestion and sinus pressure.   ?Respiratory:  Negative for cough and chest tightness.   ?     SOB with exertion.   ?Cardiovascular:  Negative for chest pain, palpitations and leg swelling.  ?Gastrointestinal:  Negative for abdominal pain, nausea and vomiting.  ?Genitourinary:  Negative for difficulty urinating and dysuria.  ?Musculoskeletal:  Positive for back pain. Negative for myalgias.  ?Skin:  Negative for color change and rash.  ?Neurological:  Negative for dizziness, light-headedness and headaches.  ?Psychiatric/Behavioral:  Negative for agitation and dysphoric mood.   ? ?   ?Objective:  ?  ? ?BP (!) 100/56 (BP Location: Left Arm, Patient Position: Sitting, Cuff Size: Small)   Pulse 62   Temp 97.6 ?F (36.4 ?C) (Temporal)   Resp 12   Ht '5\' 7"'$  (1.702 m)   Wt 140 lb (63.5 kg)   LMP 06/26/1985   SpO2 97%   BMI 21.93 kg/m?  ?Wt Readings from Last 3 Encounters:  ?10/02/21 140 lb (63.5 kg)  ?09/12/21 150 lb 6.4 oz (68.2 kg)  ?08/22/21 164 lb 6 oz (74.6 kg)  ? ? ?Physical Exam ?Vitals reviewed.  ?Constitutional:   ?   General: She is not in acute distress. ?   Appearance: Normal appearance.  ?HENT:  ?   Head: Normocephalic and atraumatic.  ?   Right Ear: External ear normal.  ?   Left Ear: External ear normal.  ?Eyes:  ?   General: No scleral icterus.    ?   Right eye: No discharge.     ?   Left eye: No discharge.  ?   Conjunctiva/sclera: Conjunctivae normal.  ?Neck:  ?   Thyroid: No thyromegaly.  ?Cardiovascular:  ?   Rate and Rhythm: Normal rate and regular rhythm.  ?Pulmonary:  ?   Effort: No respiratory distress.  ?   Breath  sounds: Normal breath sounds. No wheezing.  ?Abdominal:  ?   General: Bowel sounds are normal.  ?   Palpations: Abdomen is soft.  ?   Tenderness: There is no abdominal tenderness.  ?Musculoskeletal:     ?  General: No swelling or tenderness.  ?   Cervical back: Neck supple. No tenderness.  ?Lymphadenopathy:  ?   Cervical: No cervical adenopathy.  ?Skin: ?   Findings: No erythema or rash.  ?Neurological:  ?   Mental Status: She is alert.  ?Psychiatric:     ?   Mood and Affect: Mood normal.     ?   Behavior: Behavior normal.  ? ? ? ?Outpatient Encounter Medications as of 10/02/2021  ?Medication Sig  ? acetaminophen (TYLENOL) 650 MG CR tablet Take 650 mg by mouth every 8 (eight) hours as needed for pain. Reported on 08/02/2015  ? furosemide (LASIX) 40 MG tablet Take 1 tablet (40 mg total) by mouth daily. TAKE 1 TO 2 TABLETS BY MOUTH EVERY DAY AS DIRECTED  ? Multiple Vitamin (MULTIVITAMIN) tablet Take 1 tablet by mouth daily.  ? OVER THE COUNTER MEDICATION Place 1 drop into both eyes every 8 (eight) hours as needed (dry eyes.). Lubricant eye drops  ? Polyethylene Glycol 3350 (MIRALAX PO) Take 17 g by mouth every 3 (three) days. Every 3 days  ? rivaroxaban (XARELTO) 20 MG TABS tablet TAKE 1 TABLET(20 MG) BY MOUTH DAILY WITH SUPPER  ? losartan (COZAAR) 25 MG tablet Take 1 tablet (25 mg total) by mouth daily.  ? metoprolol tartrate (LOPRESSOR) 50 MG tablet Take 1 tablet (50 mg total) by mouth 2 (two) times daily.  ? potassium chloride (KLOR-CON) 20 MEQ packet Take 20 mEq by mouth every other day.  ? ?No facility-administered encounter medications on file as of 10/02/2021.  ?  ? ?Lab Results  ?Component Value Date  ? WBC 5.1 10/02/2021  ? HGB 14.2 10/02/2021  ? HCT 43.3 10/02/2021  ? PLT 130.0 (L) 10/02/2021  ? GLUCOSE 156 (H) 10/02/2021  ? CHOL 142 07/31/2021  ? TRIG 52.0 07/31/2021  ? HDL 68.60 07/31/2021  ? LDLDIRECT 104.0 08/12/2017  ? [place] 63 07/31/2021  ? ALT 27 09/12/2021  ? AST 31 09/12/2021  ? NA 140 10/02/2021  ? K  4.3 10/02/2021  ? CL 101 10/02/2021  ? CREATININE 1.05 10/02/2021  ? BUN 22 10/02/2021  ? CO2 34 (H) 10/02/2021  ? TSH 2.75 07/31/2021  ? INR 1.2 03/20/2021  ? ? ?US Abdomen Complete ? ?Result Date: 08/30/2021 ?CLI

## 2021-10-02 NOTE — Patient Instructions (Signed)
Saline nasal spray - flush nose at least 2x/day ? ?Nasacort nasal spray- 2 sprays each nostril one time per day.  Do this in the evening.   ? ?Delsym - if needed for cough.  ?

## 2021-10-05 ENCOUNTER — Telehealth: Payer: Self-pay

## 2021-10-08 NOTE — Telephone Encounter (Signed)
error 

## 2021-10-13 ENCOUNTER — Encounter: Payer: Self-pay | Admitting: Internal Medicine

## 2021-10-13 DIAGNOSIS — R0981 Nasal congestion: Secondary | ICD-10-CM

## 2021-10-13 HISTORY — DX: Nasal congestion: R09.81

## 2021-10-13 NOTE — Assessment & Plan Note (Signed)
Low cholesterol diet and exercise.  Have discussed calculated cholesterol risk and recommendation to start cholesterol medication.  She declines.  Follow lipid panel.   

## 2021-10-13 NOTE — Assessment & Plan Note (Signed)
Continues on metoprolol and xarelto.  Stable. Follow.  

## 2021-10-13 NOTE — Assessment & Plan Note (Signed)
Saw podiatry.  Nail debridement.  ?

## 2021-10-13 NOTE — Assessment & Plan Note (Signed)
Noted on recent abdominal ultrasound.  Breathing is better.  Taking lasix daily.  Recent cxr - decreased size of left side pleural effusion.  Discussed possible etiologies.  Discussed possible need for referral to pulmonary. Refer to pulmonary for further w/up and evaluation and for persistent sob with exertion.  ?

## 2021-10-13 NOTE — Assessment & Plan Note (Signed)
Breathing overall improved.  On lasix daily.  Per note and discussion today, she is taking metoprolol and losartan '25mg'$  q day.  Weight has decreased.  No evidence of volume overload today on exam.  ?

## 2021-10-13 NOTE — Assessment & Plan Note (Signed)
Recent ultrasound revealed spleen size - wnl.  hgb wnl.  Follow cbc.  ?

## 2021-10-13 NOTE — Assessment & Plan Note (Signed)
Saline nasal spray and steroid nasal spray as directed.  Robitussin DM as directed.  Follow.  ?

## 2021-10-13 NOTE — Assessment & Plan Note (Signed)
Has known spinal stenosis.  Has seen Dr Sharlet Salina.  S/p injections.  Discussed increased pain today.  Desires referral back to Dr Sharlet Salina.  ?

## 2021-10-13 NOTE — Assessment & Plan Note (Signed)
Elevated bilirubin on recent lab check.  Recheck liver panel today.  Recent ultrasound - no liver or gallbladder (or ductal) abnormality.   ?

## 2021-10-13 NOTE — Assessment & Plan Note (Signed)
Per report was stable on most recent echo.  Continue adequate blood pressure control.  Follow.  ?

## 2021-10-17 ENCOUNTER — Telehealth: Payer: Self-pay | Admitting: Internal Medicine

## 2021-10-17 DIAGNOSIS — I5030 Unspecified diastolic (congestive) heart failure: Secondary | ICD-10-CM

## 2021-10-17 NOTE — Telephone Encounter (Signed)
Patient has a lab appt 5/26, there are no orders in. ?

## 2021-10-17 NOTE — Addendum Note (Signed)
Addended by: Alisa Graff on: 10/17/2021 10:44 PM ? ? Modules accepted: Orders ? ?

## 2021-10-17 NOTE — Telephone Encounter (Signed)
Order placed for f/u met b.  

## 2021-10-22 NOTE — Progress Notes (Unsigned)
Cardiology Office Note    Date:  10/24/2021   ID:  Deborah Baxter, DOB [birth year], MRN [id number]  PCP:  Einar Pheasant, MD  Cardiologist:  Nelva Bush, MD  Electrophysiologist:  None   Chief Complaint: Follow-up  History of Present Illness:   Deborah Baxter is a 88 y.o. female with history of persistent A-fib status post briefly successful DCCV on 05/11/2021, HFpEF, possible infiltrative cardiomyopathy, pericardial effusion, pleural effusion, syncope, HTN, HLD, osteoarthritis, prior tobacco use, and spinal stenosis who presents for follow-up of A. fib and HFpEF.   Prior ED evaluation for syncope in 11/2020 with negative orthostatics and largely unrevealing labs.  Cardiac enzymes not cycled.  EKG unavailable for review.   She was admitted to [facility] fin 01/2021 with acute HFpEF, A. fib with RVR of uncertain chronicity (initially noted to be in A-fib in 03/2021), and moderate pericardial effusion.  EKG demonstrated A. fib with RVR, 121 bpm, low voltage QRS, and nonspecific ST-T changes.  High-sensitivity troponin of 28 with a delta of 17.  BNP 335.  Echo demonstrated a low normal LV systolic function with an EF of 50 to 55%, no regional wall motion abnormalities, moderate LVH, LV diastolic function parameters could not be evaluated, mildly reduced RV systolic function with normal ventricular cavity size, normal PASP, moderate biatrial enlargement, moderate pericardial effusion that was posterior to the LV without evidence of cardiac tamponade, degenerative mitral valve with moderate regurgitation and moderate to severe mitral annular calcification, moderate tricuspid regurgitation, tricuspid aortic valve with trivial insufficiency and mild aortic valve sclerosis without evidence of stenosis, mildly dilated ascending aorta measuring 41 mm, and an estimated right atrial pressure of 8 mmHg.  Given moderate LVH, biatrial enlargement, and low voltage on EKG, infiltrative cardiomyopathy was recommended to be  considered.  She responded well to IV diuresis.  With regards to her A. fib, she was rate controlled with beta-blocker and initiated on Xarelto.   She was seen in hospital follow-up on 03/30/2021 and was doing reasonably well without tachypalpitations and a noted improvement in her energy.  She still noted some exertional shortness of breath.  She reported adherence to medications and was without symptoms of bleeding.  She remained in A. fib with a ventricular rate of 94 bpm on EKG.  She was felt to be still mildly volume up with recommendation to increase furosemide to 80 mg for 3 days followed by 40 mg daily there after along with increasing KCl to 40 mEq for 3 days followed by 20 mill equivalent daily thereafter.  Subsequent repeat limited echo on 05/02/2021 demonstrated an EF of 50%, moderate LVH, normal RV systolic function and ventricular cavity size, stable moderate pericardial effusion that was posterior and lateral to the left ventricle without evidence of cardiac tamponade, moderate pleural effusion in the left lateral region, mild mitral regurgitation, trivial aortic insufficiency, mildly dilated ascending aorta measuring 41 mm, and an estimated right atrial pressure of 8 mmHg.   She was evaluated by her PCP in late 04/2021 with URI-like symptoms.  Lasix was briefly increased to 40 mg twice daily for 3 days per family.  Chest x-ray showed chronic scarring within the left lung base and no new focal abnormality.   She was seen in follow-up on 05/04/2021 and reported her breathing was largely stable, though did continue to note exertional dyspnea.  Her URI symptoms were improved.  She was without chest pain or palpitations.  Her weights had trended from 163 to 169 pounds.  She was taking furosemide 40 mg daily.  She remained in rate controlled A. fib and had not missed any doses of anticoagulation.  No bleeding concerns.  Her Lasix was titrated to 80 mg daily for 5 days followed by 40 mg daily  thereafter.  She underwent successful DCCV without complication on 07/10/2534 following 1 synchronized cardioversion at 150 J with postprocedure EKG demonstrating NSR.   She was seen in the on 05/18/2021 and was doing well from a cardiac perspective.  She had redeveloped A-fib with controlled ventricular response.  She was asymptomatic regarding this.  Her weight was down 6 pounds by our scale.  She was without symptoms of angina or decompensation.  Rate control strategy was pursued given age and she was asymptomatic in A-fib.  With regards to her possible infiltrative cardiomyopathy, patient and family preferred to defer further evaluation of this.  Repeat limited echo on 05/31/2021 demonstrated an EF of 60 to 65%, no regional wall motion abnormalities, severe concentric LVH, indeterminate LV diastolic function parameters, normal RV systolic function and ventricular cavity size, mildly elevated PASP estimated at 38.6 mmHg, moderate biatrial enlargement, moderate pericardial effusion posterior to the LV without evidence of tamponade, mild to moderate mitral regurgitation, moderate mitral annular calcification, moderate tricuspid regurgitation, mild aortic insufficiency, aortic valve sclerosis without evidence of stenosis, mildly dilated ascending aorta measuring 40 mm, and a rhythm of A-fib/flutter.   She was evaluated by her PCP in late 07/2021 with an increase in shortness of breath.  Her weight was up 2 pounds at that time but compared to her visit in 04/2021.  Lasix was briefly titrated.  Chest x-ray showed chronic left base scarring and possible chronic small pleural effusion without new consolidation.  There was also diffuse chronic interstitial coarsening.  Overall, findings were stable when compared to prior plain film.   She underwent repeat limited echo on 08/17/2021 which showed an EF of 55 to 60%, no regional wall motion abnormalities, severe LVH, indeterminate LV diastolic function parameters, normal  RV systolic function and ventricular cavity size, moderately elevated PASP estimated at 50.3 mmHg, moderate biatrial enlargement, mild to moderate mitral regurgitation, moderate tricuspid regurgitation, and estimated right atrial pressure of 15 mmHg, and a stable moderate pericardial effusion without evidence of tamponade.  Given these findings, it was recommended she increase her furosemide to 40 mg twice daily.  She was last seen in the office on 08/22/2021, noting stable intermittent shortness of breath when ambulating to the mailbox.  She was without symptoms of angina or decompensation.  Her weight was stable.  Furosemide was reduced back to 40 mg daily.  She comes in today accompanied by her daughter and is doing well from a cardiac perspective.  She denies symptoms of angina, dyspnea, palpitations, dizziness, presyncope, or syncope.  No significant lower extremity swelling.  She does note some hyperpigmentation of her bilateral feet with the right being worse than the left.  Symptoms improved with leg elevation.  She was noted to have 2+ pedal pulse at outside office yesterday.  Her weight is down from 164 pounds at her last office visit in 08/2021 to 138 pounds by our scale today.  She reports a good appetite.  She is watching her salt intake and drinking less than 2 L of fluids per day.  No falls or symptoms concerning for bleeding.  Historically, she has preferred to avoid further cardiac testing.  She is scheduled to see pulmonology next week with their recommendation being to consider [place].  Labs independently reviewed: 10/2021 - potassium 4.3, BUN 22, serum creatinine 1.05, Hgb 14.2, PLT 130 09/2021 - albumin 3.4, AST/ALT normal 07/2021 - TC 142, TG 52, HDL 68, LDL 63, TSH normal  Past Medical History:  Diagnosis Date   (HFpEF) heart failure with preserved ejection fraction ([place]) 04/24/2021   Pulmonary raised concern - question of pulmonary hypertension secondary to DD.  Question of need for  right heart cath   A-fib [facility]) 04/24/2021   Acute heart failure with preserved ejection fraction (HCC)    Ascending aorta dilatation ([place]) 08/01/2021   Atrial fibrillation with rapid ventricular response ([place]) 03/20/2021   Congestion of nasal sinus 10/13/2021   Cough 04/23/2021   Diverticulosis 09/05/2012   Colonoscopy 08/31/12 - redundant colon and diverticulosis in the sigmoid colon, descending colon, transverse colon and hepatic flexure.  Recommend f/u colonoscopy in 10 years.    Elevated bilirubin 09/13/2021   Elevated blood pressure reading 12/05/2020   Health care maintenance 07/13/2014   Physical 07/12/14.  Colonoscopy 08/31/12.  Normal bone density 04/06/10.  Mammogram 05/30/20- Birads I.     Hx of degenerative disc disease    Hypercholesterolemia    Hypertensive urgency 03/20/2021   Ingrown toenail 09/13/2021   Left hand pain 03/20/2021   Low back pain 01/29/2021   Lumbago 07/13/2014   Lumbar spinal stenosis 06/27/2012   Neuromuscular disorder (HCC)    pinched nerve   Numbness and tingling in left hand 08/21/2017   Osteoarthritis    Pleural effusion 09/13/2021   Pulmonary edema 03/20/2021   Seasonal allergies    Spinal stenosis    chronic back and leg pain   Stress 02/03/2021   Syncope 12/05/2020   Thrombocytopenia ([place]) 09/13/2021    Past Surgical History:  Procedure Laterality Date   APPENDECTOMY     bladder tack surgery  2000   BTL  1976   CARDIOVERSION N/A 05/11/2021   Procedure: CARDIOVERSION;  Surgeon: Minna Merritts, MD;  Location: [facility] ORS;  Service: Cardiovascular;  Laterality: N/A;   CARPAL TUNNEL RELEASE     CATARACT EXTRACTION W/PHACO Left 12/07/2015   Procedure: CATARACT EXTRACTION PHACO AND INTRAOCULAR LENS PLACEMENT ([place]);  Surgeon: Eulogio Bear, MD;  Location: [facility] ORS;  Service: Ophthalmology;  Laterality: Left;  [place] 1.23 AP% 23.2 CDE 19.28 Fluid Pack Lot # [id number]   EYE SURGERY  03/19/15   GANGLION CYST EXCISION     right hand   GAS/FLUID EXCHANGE Left  03/08/2015   Procedure: GAS/FLUID EXCHANGE;  Surgeon: Milus Height, MD;  Location: [facility] ORS;  Service: Ophthalmology;  Laterality: Left;   TONSILLECTOMY     TUBAL LIGATION     VITRECTOMY 25 GAUGE WITH SCLERAL BUCKLE Left 03/08/2015   Procedure: VITRECTOMY 25 GAUGE, membrane peel, 26 %  SF6 gas exchange;  Surgeon: Milus Height, MD;  Location: [facility] ORS;  Service: Ophthalmology;  Laterality: Left;  casette lot # [id number]    Current Medications: Current Meds  Medication Sig   acetaminophen (TYLENOL) 650 MG CR tablet Take 650 mg by mouth every 8 (eight) hours as needed for pain. Reported on 08/02/2015   furosemide (LASIX) 40 MG tablet Take 1 tablet (40 mg total) by mouth daily. TAKE 1 TO 2 TABLETS BY MOUTH EVERY DAY AS DIRECTED   losartan (COZAAR) 25 MG tablet Take 25 mg by mouth daily.   metoprolol tartrate (LOPRESSOR) 25 MG tablet Take 1 tablet (25 mg total) by mouth 2 (two) times daily.   Multiple Vitamin (MULTIVITAMIN) tablet Take 1  tablet by mouth daily.   OVER THE COUNTER MEDICATION Place 1 drop into both eyes every 8 (eight) hours as needed (dry eyes.). Lubricant eye drops   Polyethylene Glycol 3350 (MIRALAX PO) Take 17 g by mouth every 3 (three) days. Every 3 days   potassium chloride SA (KLOR-CON M) 20 MEQ tablet Take 20 mEq by mouth every other day.   predniSONE (DELTASONE) 5 MG tablet Take 30 mg by mouth once. Then taper dose down daily   Rivaroxaban (XARELTO) 15 MG TABS tablet Take 1 tablet (15 mg total) by mouth 2 (two) times daily with a meal.   [DISCONTINUED] metoprolol tartrate (LOPRESSOR) 50 MG tablet Take 50 mg by mouth 2 (two) times daily.   [DISCONTINUED] rivaroxaban (XARELTO) 20 MG TABS tablet TAKE 1 TABLET(20 MG) BY MOUTH DAILY WITH SUPPER   Current Facility-Administered Medications for the 10/24/21 encounter (Office Visit) with Rise Mu, PA-C  Medication   sodium chloride flush (NS) 0.9 % injection 3 mL    Allergies:   Quinolones   Social History    Socioeconomic History   Marital status: Widowed    Spouse name: Not on file   Number of children: 5   Years of education: Not on file   Highest education level: Not on file  Occupational History   Not on file  Tobacco Use   Smoking status: Former   Smokeless tobacco: Never  Vaping Use   Vaping Use: Never used  Substance and Sexual Activity   Alcohol use: Yes    Alcohol/week: 0.0 standard drinks    Comment: occassional   Drug use: No   Sexual activity: Not on file  Other Topics Concern   Not on file  Social History Narrative   Not on file   Social Determinants of Health   Financial Resource Strain: Low Risk    Difficulty of Paying Living Expenses: Not hard at all  Food Insecurity: No Food Insecurity   Worried About Charity fundraiser in the Last Year: Never true   [place] in the Last Year: Never true  Transportation Needs: No Transportation Needs   Lack of Transportation (Medical): No   Lack of Transportation (Non-Medical): No  Physical Activity: Not on file  Stress: No Stress Concern Present   Feeling of Stress : Not at all  Social Connections: Unknown   Frequency of Communication with Friends and Family: More than three times a week   Frequency of Social Gatherings with Friends and Family: More than three times a week   Attends Religious Services: More than 4 times per year   Active Member of [employer] or Organizations: Not on file   Attends Archivist Meetings: Not on file   Marital Status: Widowed     Family History:  The patient's family history includes Breast cancer in her maternal aunt and sister; Cancer in her sister; Diabetes in her mother; Heart disease in her father; Leukemia in her father; Thyroid cancer in her sister. There is no history of Colon cancer.  ROS:   12-point review of systems is negative unless otherwise noted in HPI.   EKGs/Labs/Other Studies Reviewed:    Studies reviewed were summarized above. The additional studies  were reviewed today:  2D echo 03/20/2021: 1. Left ventricular ejection fraction, by estimation, is 50 to 55%. The  left ventricle has low normal function. The left ventricle has no regional  wall motion abnormalities. There is moderate left ventricular hypertrophy.  Left ventricular diastolic  function could not be evaluated.   2. Right ventricular systolic function is mildly reduced. The right  ventricular size is normal. There is normal pulmonary artery systolic  pressure.   3. Left atrial size was moderately dilated.   4. Right atrial size was moderately dilated.   5. Moderate pericardial effusion. The pericardial effusion is posterior  to the left ventricle. There is no evidence of cardiac tamponade.   6. The mitral valve is degenerative. Moderate mitral valve regurgitation.  Moderate to severe mitral annular calcification.   7. Tricuspid valve is mildly thickened. The tricuspid valve is abnormal.  Tricuspid valve regurgitation is moderate.   8. The aortic valve is tricuspid. There is mild thickening of the aortic  valve. Aortic valve regurgitation is trivial. Mild aortic valve sclerosis  is present, with no evidence of aortic valve stenosis.   9. There is mild dilatation of the ascending aorta, measuring 41 mm.  10. The inferior vena cava is normal in size with <50% respiratory  variability, suggesting right atrial pressure of 8 mmHg.   Conclusion(s)/Recommendation(s): Given moderate LVH, biatrial enlargement,  and low voltage on EKG, infiltrative cardiomyopathy, including  amyloidosis, should be considered.  __________   Limited echo 05/02/2021: 1. Left ventricular ejection fraction, by estimation, is 50%. There is  moderate left ventricular hypertrophy.   2. Right ventricular systolic function is normal. The right ventricular  size is normal.   3. Moderate pericardial effusion. The pericardial effusion is posterior  and lateral to the left ventricle. There is no evidence  of cardiac  tamponade. Moderate pleural effusion in the left lateral region.   4. Mild mitral valve regurgitation.   5. The aortic valve is tricuspid. Aortic valve regurgitation is trivial.   6. Aortic dilatation noted. There is mild dilatation of the ascending  aorta, measuring 41 mm.   7. The inferior vena cava is normal in size with <50% respiratory  variability, suggesting right atrial pressure of 8 mmHg.  __________   Limited echo 05/31/2021: 1. Left ventricular ejection fraction, by estimation, is 60 to 65%. The  left ventricle has normal function. The left ventricle has no regional  wall motion abnormalities. There is severe concentric left ventricular  hypertrophy. Left ventricular diastolic   parameters are indeterminate.   2. Right ventricular systolic function is normal. The right ventricular  size is normal. There is mildly elevated pulmonary artery systolic  pressure. The estimated right ventricular systolic pressure is 08.6 mmHg.   3. Left atrial size was moderately dilated.   4. Right atrial size was moderately dilated.   5. Moderate pericardial effusion in the posterior LV region, estimated  1.69 -2.00 cm, otherwise mild circumferential effusion No tamponade.   6. The mitral valve is normal in structure. Mild to moderate mitral valve  regurgitation. No evidence of mitral stenosis. Moderate mitral annular  calcification.   7. Tricuspid valve regurgitation is moderate.   8. The aortic valve is normal in structure. Aortic valve regurgitation is  mild. Aortic valve sclerosis/calcification is present, without any  evidence of aortic stenosis.   9. The inferior vena cava is normal in size with greater than 50%  respiratory variability, suggesting right atrial pressure of 3 mmHg.  10. There is mild dilatation of the ascending aorta, measuring 40 mm.  11. Rhythm apppears to be atrial fib/flutter. __________   Limited echo 08/17/2021: 1. Left ventricular ejection fraction,  by estimation, is 55 to 60%. The  left ventricle has normal function. The left ventricle  has no regional  wall motion abnormalities. There is severe left ventricular hypertrophy.  Left ventricular diastolic parameters   are indeterminate.   2. Right ventricular systolic function is normal. The right ventricular  size is normal. There is moderately elevated pulmonary artery systolic  pressure. The estimated right ventricular systolic pressure is 09.3 mmHg.   3. Left atrial size was moderately dilated.   4. Right atrial size was moderately dilated.   5. The mitral valve is normal in structure. Mild to moderate mitral valve  regurgitation. No evidence of mitral stenosis.   6. Tricuspid valve regurgitation is moderate.   7. The aortic valve is tricuspid. Aortic valve regurgitation is not  visualized. No aortic stenosis is present.   8. The inferior vena cava is dilated in size with <50% respiratory  variability, suggesting right atrial pressure of 15 mmHg.   9. Moderate pericardial effusion , estimated 1.3 to 2.6 cm off the LV  free wall, mild to moderate off the RV free wall (1.6 cm). No tamponade  noted.   EKG:  EKG is ordered today.  The EKG ordered today demonstrates NSR, 65 bpm, low voltage QRS, possible prior septal infarct, baseline wandering, nonspecific T wave inversion in lead aVL  Recent Labs: 03/20/2021: B Natriuretic Peptide 335.6 07/31/2021: TSH 2.75 09/12/2021: ALT 27 10/24/2021: BUN 25; Creatinine, Ser 0.75; Hemoglobin 13.9; Platelets 160; Potassium 4.7; Sodium 136  Recent Lipid Panel    Component Value Date/Time   CHOL 142 07/31/2021 1104   TRIG 52.0 07/31/2021 1104   HDL 68.60 07/31/2021 1104   CHOLHDL 2 07/31/2021 1104   VLDL 10.4 07/31/2021 1104   LDLCALC 63 07/31/2021 1104   LDLDIRECT 104.0 08/12/2017 0932    PHYSICAL EXAM:    VS:  BP 94/60   Pulse 65   Ht '5\' 7"'$  (1.702 m)   Wt 138 lb (62.6 kg)   LMP 06/26/1985   SpO2 95%   BMI 21.61 kg/m   BMI: Body  mass index is 21.61 kg/m.  Physical Exam Vitals reviewed.  Constitutional:      Appearance: She is well-developed.  HENT:     Head: Normocephalic and atraumatic.  Eyes:     General:        Right eye: No discharge.        Left eye: No discharge.  Neck:     Vascular: No JVD.  Cardiovascular:     Rate and Rhythm: Normal rate and regular rhythm.     Pulses:          Dorsalis pedis pulses are 2+ on the right side and 2+ on the left side.       Posterior tibial pulses are 2+ on the right side and 2+ on the left side.     Heart sounds: Normal heart sounds, S1 normal and S2 normal. Heart sounds not distant. No midsystolic click and no opening snap. No murmur heard.   No friction rub.  Pulmonary:     Effort: Pulmonary effort is normal. No respiratory distress.     Breath sounds: Normal breath sounds. No decreased breath sounds, wheezing or rales.  Chest:     Chest wall: No tenderness.  Abdominal:     General: There is no distension.     Palpations: Abdomen is soft.  Musculoskeletal:     Cervical back: Normal range of motion.     Right lower leg: No edema.     Left lower leg: No edema.     Comments:  Mild bilateral hyperpigmentation of the feet with the right being more prominent than the left.  No wounds.  Skin:    General: Skin is warm and dry.     Nails: There is no clubbing.  Neurological:     Mental Status: She is alert and oriented to person, place, and time.  Psychiatric:        Speech: Speech normal.        Behavior: Behavior normal.        Thought Content: Thought content normal.        Judgment: Judgment normal.    Wt Readings from Last 3 Encounters:  10/24/21 138 lb (62.6 kg)  10/02/21 140 lb (63.5 kg)  09/12/21 150 lb 6.4 oz (68.2 kg)     ASSESSMENT & PLAN:   HFpEF with possible infiltrative cardiomyopathy/pulmonary hypertension: She denies any symptoms of decompensation.  Her weight is down 26 pounds today when compared to her visit in 08/2021, this is  unintentional.  After discussion with pulmonology, followed by discussion with patient and family, we will pursue RHC and cardiac MRI for further evaluation and assessment with hemodynamic status to gauge medical therapy, as well as to potentially assist with goals of care discussions down the road.  We may need to de-escalate diuretic therapy with weight loss.  Check BMP and CBC.  Based on updated labs and RHC, would consider de-escalation of furosemide and addition of SGLT2 inhibitor as tolerated.  Persistent A-fib: Since she was last seen, she has spontaneously converted to sinus rhythm.  She remains on Lopressor, which was decreased to 25 mg twice daily today secondary to relative hypotension.  Given a CHA2DS2-VASc of at least 5, she remains on Xarelto.  We will decrease her dose to 15 mg given ongoing weight loss with a decreased creatinine clearance of 38.1 today.  No symptoms concerning for bleeding.  Check BMP and CBC.  Moderate pericardial effusion: Stable with no evidence of cardiac tamponade physiology on recent echo.  She remains on furosemide as outlined above.  Aortic insufficiency/mitral regurgitation: Monitor with periodic echo.  HTN: Blood pressure is on the soft side today.  This is likely in the context of weight loss.  Decrease metoprolol to 25 mg twice daily.  Check BMP.  May need to further decrease furosemide.  Mildly dilated ascending aorta: Most recent echo documented normal size and structure.  Weight loss: Unintentional.  Uncertain etiology.  May need to decrease furosemide based on updated labs.   Shared Decision Making/Informed Consent{  The risks [stroke (1 in 1000), death (1 in 1000), kidney failure [usually temporary] (1 in 500), bleeding (1 in 200), allergic reaction [possibly serious] (1 in 200)], benefits (diagnostic support and management of coronary artery disease) and alternatives of a cardiac catheterization were discussed in detail with Ms. Pham and she is  willing to proceed.     Disposition: F/u with Dr. Saunders Revel or an APP after cardiac testing.   Medication Adjustments/Labs and Tests Ordered: Current medicines are reviewed at length with the patient today.  Concerns regarding medicines are outlined above. Medication changes, Labs and Tests ordered today are summarized above and listed in the Patient Instructions accessible in Encounters.   Signed, Christell Faith, PA-C 10/24/2021 12:56 PM     [place] [address] [place] [address] [place], [place] [id number] [phone]

## 2021-10-22 NOTE — H&P (View-Only) (Signed)
Cardiology Office Note    Date:  10/24/2021   ID:  Deborah Baxter, DOB [birth year], MRN [id number]  PCP:  Einar Pheasant, MD  Cardiologist:  Nelva Bush, MD  Electrophysiologist:  None   Chief Complaint: Follow-up  History of Present Illness:   Deborah Baxter is a 88 y.o. female with history of persistent A-fib status post briefly successful DCCV on 05/11/2021, HFpEF, possible infiltrative cardiomyopathy, pericardial effusion, pleural effusion, syncope, HTN, HLD, osteoarthritis, prior tobacco use, and spinal stenosis who presents for follow-up of A. fib and HFpEF.   Prior ED evaluation for syncope in 11/2020 with negative orthostatics and largely unrevealing labs.  Cardiac enzymes not cycled.  EKG unavailable for review.   She was admitted to [facility] fin 01/2021 with acute HFpEF, A. fib with RVR of uncertain chronicity (initially noted to be in A-fib in 03/2021), and moderate pericardial effusion.  EKG demonstrated A. fib with RVR, 121 bpm, low voltage QRS, and nonspecific ST-T changes.  High-sensitivity troponin of 28 with a delta of 17.  BNP 335.  Echo demonstrated a low normal LV systolic function with an EF of 50 to 55%, no regional wall motion abnormalities, moderate LVH, LV diastolic function parameters could not be evaluated, mildly reduced RV systolic function with normal ventricular cavity size, normal PASP, moderate biatrial enlargement, moderate pericardial effusion that was posterior to the LV without evidence of cardiac tamponade, degenerative mitral valve with moderate regurgitation and moderate to severe mitral annular calcification, moderate tricuspid regurgitation, tricuspid aortic valve with trivial insufficiency and mild aortic valve sclerosis without evidence of stenosis, mildly dilated ascending aorta measuring 41 mm, and an estimated right atrial pressure of 8 mmHg.  Given moderate LVH, biatrial enlargement, and low voltage on EKG, infiltrative cardiomyopathy was recommended to be  considered.  She responded well to IV diuresis.  With regards to her A. fib, she was rate controlled with beta-blocker and initiated on Xarelto.   She was seen in hospital follow-up on 03/30/2021 and was doing reasonably well without tachypalpitations and a noted improvement in her energy.  She still noted some exertional shortness of breath.  She reported adherence to medications and was without symptoms of bleeding.  She remained in A. fib with a ventricular rate of 94 bpm on EKG.  She was felt to be still mildly volume up with recommendation to increase furosemide to 80 mg for 3 days followed by 40 mg daily there after along with increasing KCl to 40 mEq for 3 days followed by 20 mill equivalent daily thereafter.  Subsequent repeat limited echo on 05/02/2021 demonstrated an EF of 50%, moderate LVH, normal RV systolic function and ventricular cavity size, stable moderate pericardial effusion that was posterior and lateral to the left ventricle without evidence of cardiac tamponade, moderate pleural effusion in the left lateral region, mild mitral regurgitation, trivial aortic insufficiency, mildly dilated ascending aorta measuring 41 mm, and an estimated right atrial pressure of 8 mmHg.   She was evaluated by her PCP in late 04/2021 with URI-like symptoms.  Lasix was briefly increased to 40 mg twice daily for 3 days per family.  Chest x-ray showed chronic scarring within the left lung base and no new focal abnormality.   She was seen in follow-up on 05/04/2021 and reported her breathing was largely stable, though did continue to note exertional dyspnea.  Her URI symptoms were improved.  She was without chest pain or palpitations.  Her weights had trended from 163 to 169 pounds.  She was taking furosemide 40 mg daily.  She remained in rate controlled A. fib and had not missed any doses of anticoagulation.  No bleeding concerns.  Her Lasix was titrated to 80 mg daily for 5 days followed by 40 mg daily  thereafter.  She underwent successful DCCV without complication on 60/11/3014 following 1 synchronized cardioversion at 150 J with postprocedure EKG demonstrating NSR.   She was seen in the on 05/18/2021 and was doing well from a cardiac perspective.  She had redeveloped A-fib with controlled ventricular response.  She was asymptomatic regarding this.  Her weight was down 6 pounds by our scale.  She was without symptoms of angina or decompensation.  Rate control strategy was pursued given age and she was asymptomatic in A-fib.  With regards to her possible infiltrative cardiomyopathy, patient and family preferred to defer further evaluation of this.  Repeat limited echo on 05/31/2021 demonstrated an EF of 60 to 65%, no regional wall motion abnormalities, severe concentric LVH, indeterminate LV diastolic function parameters, normal RV systolic function and ventricular cavity size, mildly elevated PASP estimated at 38.6 mmHg, moderate biatrial enlargement, moderate pericardial effusion posterior to the LV without evidence of tamponade, mild to moderate mitral regurgitation, moderate mitral annular calcification, moderate tricuspid regurgitation, mild aortic insufficiency, aortic valve sclerosis without evidence of stenosis, mildly dilated ascending aorta measuring 40 mm, and a rhythm of A-fib/flutter.   She was evaluated by her PCP in late 07/2021 with an increase in shortness of breath.  Her weight was up 2 pounds at that time but compared to her visit in 04/2021.  Lasix was briefly titrated.  Chest x-ray showed chronic left base scarring and possible chronic small pleural effusion without new consolidation.  There was also diffuse chronic interstitial coarsening.  Overall, findings were stable when compared to prior plain film.   She underwent repeat limited echo on 08/17/2021 which showed an EF of 55 to 60%, no regional wall motion abnormalities, severe LVH, indeterminate LV diastolic function parameters, normal  RV systolic function and ventricular cavity size, moderately elevated PASP estimated at 50.3 mmHg, moderate biatrial enlargement, mild to moderate mitral regurgitation, moderate tricuspid regurgitation, and estimated right atrial pressure of 15 mmHg, and a stable moderate pericardial effusion without evidence of tamponade.  Given these findings, it was recommended she increase her furosemide to 40 mg twice daily.  She was last seen in the office on 08/22/2021, noting stable intermittent shortness of breath when ambulating to the mailbox.  She was without symptoms of angina or decompensation.  Her weight was stable.  Furosemide was reduced back to 40 mg daily.  She comes in today accompanied by her daughter and is doing well from a cardiac perspective.  She denies symptoms of angina, dyspnea, palpitations, dizziness, presyncope, or syncope.  No significant lower extremity swelling.  She does note some hyperpigmentation of her bilateral feet with the right being worse than the left.  Symptoms improved with leg elevation.  She was noted to have 2+ pedal pulse at outside office yesterday.  Her weight is down from 164 pounds at her last office visit in 08/2021 to 138 pounds by our scale today.  She reports a good appetite.  She is watching her salt intake and drinking less than 2 L of fluids per day.  No falls or symptoms concerning for bleeding.  Historically, she has preferred to avoid further cardiac testing.  She is scheduled to see pulmonology next week with their recommendation being to consider [place].  Labs independently reviewed: 10/2021 - potassium 4.3, BUN 22, serum creatinine 1.05, Hgb 14.2, PLT 130 09/2021 - albumin 3.4, AST/ALT normal 07/2021 - TC 142, TG 52, HDL 68, LDL 63, TSH normal  Past Medical History:  Diagnosis Date   (HFpEF) heart failure with preserved ejection fraction ([place]) 04/24/2021   Pulmonary raised concern - question of pulmonary hypertension secondary to DD.  Question of need for  right heart cath   A-fib [facility]) 04/24/2021   Acute heart failure with preserved ejection fraction (HCC)    Ascending aorta dilatation ([place]) 08/01/2021   Atrial fibrillation with rapid ventricular response ([place]) 03/20/2021   Congestion of nasal sinus 10/13/2021   Cough 04/23/2021   Diverticulosis 09/05/2012   Colonoscopy 08/31/12 - redundant colon and diverticulosis in the sigmoid colon, descending colon, transverse colon and hepatic flexure.  Recommend f/u colonoscopy in 10 years.    Elevated bilirubin 09/13/2021   Elevated blood pressure reading 12/05/2020   Health care maintenance 07/13/2014   Physical 07/12/14.  Colonoscopy 08/31/12.  Normal bone density 04/06/10.  Mammogram 05/30/20- Birads I.     Hx of degenerative disc disease    Hypercholesterolemia    Hypertensive urgency 03/20/2021   Ingrown toenail 09/13/2021   Left hand pain 03/20/2021   Low back pain 01/29/2021   Lumbago 07/13/2014   Lumbar spinal stenosis 06/27/2012   Neuromuscular disorder (HCC)    pinched nerve   Numbness and tingling in left hand 08/21/2017   Osteoarthritis    Pleural effusion 09/13/2021   Pulmonary edema 03/20/2021   Seasonal allergies    Spinal stenosis    chronic back and leg pain   Stress 02/03/2021   Syncope 12/05/2020   Thrombocytopenia ([place]) 09/13/2021    Past Surgical History:  Procedure Laterality Date   APPENDECTOMY     bladder tack surgery  2000   BTL  1976   CARDIOVERSION N/A 05/11/2021   Procedure: CARDIOVERSION;  Surgeon: Minna Merritts, MD;  Location: [facility] ORS;  Service: Cardiovascular;  Laterality: N/A;   CARPAL TUNNEL RELEASE     CATARACT EXTRACTION W/PHACO Left 12/07/2015   Procedure: CATARACT EXTRACTION PHACO AND INTRAOCULAR LENS PLACEMENT ([place]);  Surgeon: Eulogio Bear, MD;  Location: [facility] ORS;  Service: Ophthalmology;  Laterality: Left;  [place] 1.23 AP% 23.2 CDE 19.28 Fluid Pack Lot # [id number]   EYE SURGERY  03/19/15   GANGLION CYST EXCISION     right hand   GAS/FLUID EXCHANGE Left  03/08/2015   Procedure: GAS/FLUID EXCHANGE;  Surgeon: Milus Height, MD;  Location: [facility] ORS;  Service: Ophthalmology;  Laterality: Left;   TONSILLECTOMY     TUBAL LIGATION     VITRECTOMY 25 GAUGE WITH SCLERAL BUCKLE Left 03/08/2015   Procedure: VITRECTOMY 25 GAUGE, membrane peel, 26 %  SF6 gas exchange;  Surgeon: Milus Height, MD;  Location: [facility] ORS;  Service: Ophthalmology;  Laterality: Left;  casette lot # [id number]    Current Medications: Current Meds  Medication Sig   acetaminophen (TYLENOL) 650 MG CR tablet Take 650 mg by mouth every 8 (eight) hours as needed for pain. Reported on 08/02/2015   furosemide (LASIX) 40 MG tablet Take 1 tablet (40 mg total) by mouth daily. TAKE 1 TO 2 TABLETS BY MOUTH EVERY DAY AS DIRECTED   losartan (COZAAR) 25 MG tablet Take 25 mg by mouth daily.   metoprolol tartrate (LOPRESSOR) 25 MG tablet Take 1 tablet (25 mg total) by mouth 2 (two) times daily.   Multiple Vitamin (MULTIVITAMIN) tablet Take 1  tablet by mouth daily.   OVER THE COUNTER MEDICATION Place 1 drop into both eyes every 8 (eight) hours as needed (dry eyes.). Lubricant eye drops   Polyethylene Glycol 3350 (MIRALAX PO) Take 17 g by mouth every 3 (three) days. Every 3 days   potassium chloride SA (KLOR-CON M) 20 MEQ tablet Take 20 mEq by mouth every other day.   predniSONE (DELTASONE) 5 MG tablet Take 30 mg by mouth once. Then taper dose down daily   Rivaroxaban (XARELTO) 15 MG TABS tablet Take 1 tablet (15 mg total) by mouth 2 (two) times daily with a meal.   [DISCONTINUED] metoprolol tartrate (LOPRESSOR) 50 MG tablet Take 50 mg by mouth 2 (two) times daily.   [DISCONTINUED] rivaroxaban (XARELTO) 20 MG TABS tablet TAKE 1 TABLET(20 MG) BY MOUTH DAILY WITH SUPPER   Current Facility-Administered Medications for the 10/24/21 encounter (Office Visit) with Rise Mu, PA-C  Medication   sodium chloride flush (NS) 0.9 % injection 3 mL    Allergies:   Quinolones   Social History    Socioeconomic History   Marital status: Widowed    Spouse name: Not on file   Number of children: 5   Years of education: Not on file   Highest education level: Not on file  Occupational History   Not on file  Tobacco Use   Smoking status: Former   Smokeless tobacco: Never  Vaping Use   Vaping Use: Never used  Substance and Sexual Activity   Alcohol use: Yes    Alcohol/week: 0.0 standard drinks    Comment: occassional   Drug use: No   Sexual activity: Not on file  Other Topics Concern   Not on file  Social History Narrative   Not on file   Social Determinants of Health   Financial Resource Strain: Low Risk    Difficulty of Paying Living Expenses: Not hard at all  Food Insecurity: No Food Insecurity   Worried About Charity fundraiser in the Last Year: Never true   [place] in the Last Year: Never true  Transportation Needs: No Transportation Needs   Lack of Transportation (Medical): No   Lack of Transportation (Non-Medical): No  Physical Activity: Not on file  Stress: No Stress Concern Present   Feeling of Stress : Not at all  Social Connections: Unknown   Frequency of Communication with Friends and Family: More than three times a week   Frequency of Social Gatherings with Friends and Family: More than three times a week   Attends Religious Services: More than 4 times per year   Active Member of [employer] or Organizations: Not on file   Attends Archivist Meetings: Not on file   Marital Status: Widowed     Family History:  The patient's family history includes Breast cancer in her maternal aunt and sister; Cancer in her sister; Diabetes in her mother; Heart disease in her father; Leukemia in her father; Thyroid cancer in her sister. There is no history of Colon cancer.  ROS:   12-point review of systems is negative unless otherwise noted in HPI.   EKGs/Labs/Other Studies Reviewed:    Studies reviewed were summarized above. The additional studies  were reviewed today:  2D echo 03/20/2021: 1. Left ventricular ejection fraction, by estimation, is 50 to 55%. The  left ventricle has low normal function. The left ventricle has no regional  wall motion abnormalities. There is moderate left ventricular hypertrophy.  Left ventricular diastolic  function could not be evaluated.   2. Right ventricular systolic function is mildly reduced. The right  ventricular size is normal. There is normal pulmonary artery systolic  pressure.   3. Left atrial size was moderately dilated.   4. Right atrial size was moderately dilated.   5. Moderate pericardial effusion. The pericardial effusion is posterior  to the left ventricle. There is no evidence of cardiac tamponade.   6. The mitral valve is degenerative. Moderate mitral valve regurgitation.  Moderate to severe mitral annular calcification.   7. Tricuspid valve is mildly thickened. The tricuspid valve is abnormal.  Tricuspid valve regurgitation is moderate.   8. The aortic valve is tricuspid. There is mild thickening of the aortic  valve. Aortic valve regurgitation is trivial. Mild aortic valve sclerosis  is present, with no evidence of aortic valve stenosis.   9. There is mild dilatation of the ascending aorta, measuring 41 mm.  10. The inferior vena cava is normal in size with <50% respiratory  variability, suggesting right atrial pressure of 8 mmHg.   Conclusion(s)/Recommendation(s): Given moderate LVH, biatrial enlargement,  and low voltage on EKG, infiltrative cardiomyopathy, including  amyloidosis, should be considered.  __________   Limited echo 05/02/2021: 1. Left ventricular ejection fraction, by estimation, is 50%. There is  moderate left ventricular hypertrophy.   2. Right ventricular systolic function is normal. The right ventricular  size is normal.   3. Moderate pericardial effusion. The pericardial effusion is posterior  and lateral to the left ventricle. There is no evidence  of cardiac  tamponade. Moderate pleural effusion in the left lateral region.   4. Mild mitral valve regurgitation.   5. The aortic valve is tricuspid. Aortic valve regurgitation is trivial.   6. Aortic dilatation noted. There is mild dilatation of the ascending  aorta, measuring 41 mm.   7. The inferior vena cava is normal in size with <50% respiratory  variability, suggesting right atrial pressure of 8 mmHg.  __________   Limited echo 05/31/2021: 1. Left ventricular ejection fraction, by estimation, is 60 to 65%. The  left ventricle has normal function. The left ventricle has no regional  wall motion abnormalities. There is severe concentric left ventricular  hypertrophy. Left ventricular diastolic   parameters are indeterminate.   2. Right ventricular systolic function is normal. The right ventricular  size is normal. There is mildly elevated pulmonary artery systolic  pressure. The estimated right ventricular systolic pressure is 50.5 mmHg.   3. Left atrial size was moderately dilated.   4. Right atrial size was moderately dilated.   5. Moderate pericardial effusion in the posterior LV region, estimated  1.69 -2.00 cm, otherwise mild circumferential effusion No tamponade.   6. The mitral valve is normal in structure. Mild to moderate mitral valve  regurgitation. No evidence of mitral stenosis. Moderate mitral annular  calcification.   7. Tricuspid valve regurgitation is moderate.   8. The aortic valve is normal in structure. Aortic valve regurgitation is  mild. Aortic valve sclerosis/calcification is present, without any  evidence of aortic stenosis.   9. The inferior vena cava is normal in size with greater than 50%  respiratory variability, suggesting right atrial pressure of 3 mmHg.  10. There is mild dilatation of the ascending aorta, measuring 40 mm.  11. Rhythm apppears to be atrial fib/flutter. __________   Limited echo 08/17/2021: 1. Left ventricular ejection fraction,  by estimation, is 55 to 60%. The  left ventricle has normal function. The left ventricle  has no regional  wall motion abnormalities. There is severe left ventricular hypertrophy.  Left ventricular diastolic parameters   are indeterminate.   2. Right ventricular systolic function is normal. The right ventricular  size is normal. There is moderately elevated pulmonary artery systolic  pressure. The estimated right ventricular systolic pressure is 92.4 mmHg.   3. Left atrial size was moderately dilated.   4. Right atrial size was moderately dilated.   5. The mitral valve is normal in structure. Mild to moderate mitral valve  regurgitation. No evidence of mitral stenosis.   6. Tricuspid valve regurgitation is moderate.   7. The aortic valve is tricuspid. Aortic valve regurgitation is not  visualized. No aortic stenosis is present.   8. The inferior vena cava is dilated in size with <50% respiratory  variability, suggesting right atrial pressure of 15 mmHg.   9. Moderate pericardial effusion , estimated 1.3 to 2.6 cm off the LV  free wall, mild to moderate off the RV free wall (1.6 cm). No tamponade  noted.   EKG:  EKG is ordered today.  The EKG ordered today demonstrates NSR, 65 bpm, low voltage QRS, possible prior septal infarct, baseline wandering, nonspecific T wave inversion in lead aVL  Recent Labs: 03/20/2021: B Natriuretic Peptide 335.6 07/31/2021: TSH 2.75 09/12/2021: ALT 27 10/24/2021: BUN 25; Creatinine, Ser 0.75; Hemoglobin 13.9; Platelets 160; Potassium 4.7; Sodium 136  Recent Lipid Panel    Component Value Date/Time   CHOL 142 07/31/2021 1104   TRIG 52.0 07/31/2021 1104   HDL 68.60 07/31/2021 1104   CHOLHDL 2 07/31/2021 1104   VLDL 10.4 07/31/2021 1104   LDLCALC 63 07/31/2021 1104   LDLDIRECT 104.0 08/12/2017 0932    PHYSICAL EXAM:    VS:  BP 94/60   Pulse 65   Ht '5\' 7"'$  (1.702 m)   Wt 138 lb (62.6 kg)   LMP 06/26/1985   SpO2 95%   BMI 21.61 kg/m   BMI: Body  mass index is 21.61 kg/m.  Physical Exam Vitals reviewed.  Constitutional:      Appearance: She is well-developed.  HENT:     Head: Normocephalic and atraumatic.  Eyes:     General:        Right eye: No discharge.        Left eye: No discharge.  Neck:     Vascular: No JVD.  Cardiovascular:     Rate and Rhythm: Normal rate and regular rhythm.     Pulses:          Dorsalis pedis pulses are 2+ on the right side and 2+ on the left side.       Posterior tibial pulses are 2+ on the right side and 2+ on the left side.     Heart sounds: Normal heart sounds, S1 normal and S2 normal. Heart sounds not distant. No midsystolic click and no opening snap. No murmur heard.   No friction rub.  Pulmonary:     Effort: Pulmonary effort is normal. No respiratory distress.     Breath sounds: Normal breath sounds. No decreased breath sounds, wheezing or rales.  Chest:     Chest wall: No tenderness.  Abdominal:     General: There is no distension.     Palpations: Abdomen is soft.  Musculoskeletal:     Cervical back: Normal range of motion.     Right lower leg: No edema.     Left lower leg: No edema.     Comments:  Mild bilateral hyperpigmentation of the feet with the right being more prominent than the left.  No wounds.  Skin:    General: Skin is warm and dry.     Nails: There is no clubbing.  Neurological:     Mental Status: She is alert and oriented to person, place, and time.  Psychiatric:        Speech: Speech normal.        Behavior: Behavior normal.        Thought Content: Thought content normal.        Judgment: Judgment normal.    Wt Readings from Last 3 Encounters:  10/24/21 138 lb (62.6 kg)  10/02/21 140 lb (63.5 kg)  09/12/21 150 lb 6.4 oz (68.2 kg)     ASSESSMENT & PLAN:   HFpEF with possible infiltrative cardiomyopathy/pulmonary hypertension: She denies any symptoms of decompensation.  Her weight is down 26 pounds today when compared to her visit in 08/2021, this is  unintentional.  After discussion with pulmonology, followed by discussion with patient and family, we will pursue RHC and cardiac MRI for further evaluation and assessment with hemodynamic status to gauge medical therapy, as well as to potentially assist with goals of care discussions down the road.  We may need to de-escalate diuretic therapy with weight loss.  Check BMP and CBC.  Based on updated labs and RHC, would consider de-escalation of furosemide and addition of SGLT2 inhibitor as tolerated.  Persistent A-fib: Since she was last seen, she has spontaneously converted to sinus rhythm.  She remains on Lopressor, which was decreased to 25 mg twice daily today secondary to relative hypotension.  Given a CHA2DS2-VASc of at least 5, she remains on Xarelto.  We will decrease her dose to 15 mg given ongoing weight loss with a decreased creatinine clearance of 38.1 today.  No symptoms concerning for bleeding.  Check BMP and CBC.  Moderate pericardial effusion: Stable with no evidence of cardiac tamponade physiology on recent echo.  She remains on furosemide as outlined above.  Aortic insufficiency/mitral regurgitation: Monitor with periodic echo.  HTN: Blood pressure is on the soft side today.  This is likely in the context of weight loss.  Decrease metoprolol to 25 mg twice daily.  Check BMP.  May need to further decrease furosemide.  Mildly dilated ascending aorta: Most recent echo documented normal size and structure.  Weight loss: Unintentional.  Uncertain etiology.  May need to decrease furosemide based on updated labs.   Shared Decision Making/Informed Consent{  The risks [stroke (1 in 1000), death (1 in 1000), kidney failure [usually temporary] (1 in 500), bleeding (1 in 200), allergic reaction [possibly serious] (1 in 200)], benefits (diagnostic support and management of coronary artery disease) and alternatives of a cardiac catheterization were discussed in detail with Ms. Kinyon and she is  willing to proceed.     Disposition: F/u with Dr. Saunders Revel or an APP after cardiac testing.   Medication Adjustments/Labs and Tests Ordered: Current medicines are reviewed at length with the patient today.  Concerns regarding medicines are outlined above. Medication changes, Labs and Tests ordered today are summarized above and listed in the Patient Instructions accessible in Encounters.   Signed, Christell Faith, PA-C 10/24/2021 12:56 PM     [address], [place] [address] [phone]

## 2021-10-23 ENCOUNTER — Telehealth: Payer: Self-pay | Admitting: Internal Medicine

## 2021-10-23 DIAGNOSIS — M79604 Pain in right leg: Secondary | ICD-10-CM | POA: Diagnosis not present

## 2021-10-23 DIAGNOSIS — M1990 Unspecified osteoarthritis, unspecified site: Secondary | ICD-10-CM | POA: Insufficient documentation

## 2021-10-23 DIAGNOSIS — M48062 Spinal stenosis, lumbar region with neurogenic claudication: Secondary | ICD-10-CM | POA: Diagnosis not present

## 2021-10-23 DIAGNOSIS — M5416 Radiculopathy, lumbar region: Secondary | ICD-10-CM | POA: Diagnosis not present

## 2021-10-23 DIAGNOSIS — I999 Unspecified disorder of circulatory system: Secondary | ICD-10-CM | POA: Diagnosis not present

## 2021-10-23 DIAGNOSIS — G709 Myoneural disorder, unspecified: Secondary | ICD-10-CM | POA: Insufficient documentation

## 2021-10-23 DIAGNOSIS — M5136 Other intervertebral disc degeneration, lumbar region: Secondary | ICD-10-CM | POA: Diagnosis not present

## 2021-10-23 DIAGNOSIS — M4800 Spinal stenosis, site unspecified: Secondary | ICD-10-CM | POA: Insufficient documentation

## 2021-10-23 DIAGNOSIS — Z8739 Personal history of other diseases of the musculoskeletal system and connective tissue: Secondary | ICD-10-CM | POA: Insufficient documentation

## 2021-10-23 DIAGNOSIS — M8588 Other specified disorders of bone density and structure, other site: Secondary | ICD-10-CM | POA: Diagnosis not present

## 2021-10-23 DIAGNOSIS — M545 Low back pain, unspecified: Secondary | ICD-10-CM | POA: Diagnosis not present

## 2021-10-23 DIAGNOSIS — J302 Other seasonal allergic rhinitis: Secondary | ICD-10-CM | POA: Insufficient documentation

## 2021-10-23 NOTE — Telephone Encounter (Signed)
Need more information.  Who did she see today?  Who called.  Was there a palpable pulse when they saw her?  Is pt having any pain, swelling, etc.

## 2021-10-23 NOTE — Telephone Encounter (Signed)
[facility] called and wanted Dr Nicki Reaper to know they saw patient today. They noticed patient's rt foot is purple/cold to the touch/has a pulse. They did not know if Dr Nicki Reaper knew about this OR if Dr Nicki Reaper wanted to see patient. Please call patient, per [facility].

## 2021-10-24 ENCOUNTER — Other Ambulatory Visit
Admission: RE | Admit: 2021-10-24 | Discharge: 2021-10-24 | Disposition: A | Discharge status: Home / Self Care | Payer: Medicare Other | Source: Ambulatory Visit | Attending: Physician Assistant | Admitting: Physician Assistant

## 2021-10-24 ENCOUNTER — Telehealth: Payer: Self-pay | Admitting: *Deleted

## 2021-10-24 ENCOUNTER — Telehealth: Payer: Self-pay

## 2021-10-24 ENCOUNTER — Encounter: Payer: Self-pay | Admitting: Physician Assistant

## 2021-10-24 ENCOUNTER — Encounter: Payer: Self-pay | Admitting: *Deleted

## 2021-10-24 ENCOUNTER — Ambulatory Visit (INDEPENDENT_AMBULATORY_CARE_PROVIDER_SITE_OTHER): Payer: Medicare Other | Admitting: Physician Assistant

## 2021-10-24 VITALS — BP 94/60 | HR 65 | Ht 67.0 in | Wt 138.0 lb

## 2021-10-24 DIAGNOSIS — I351 Nonrheumatic aortic (valve) insufficiency: Secondary | ICD-10-CM

## 2021-10-24 DIAGNOSIS — I5030 Unspecified diastolic (congestive) heart failure: Secondary | ICD-10-CM | POA: Diagnosis not present

## 2021-10-24 DIAGNOSIS — I3139 Other pericardial effusion (noninflammatory): Secondary | ICD-10-CM | POA: Diagnosis not present

## 2021-10-24 DIAGNOSIS — I10 Essential (primary) hypertension: Secondary | ICD-10-CM | POA: Diagnosis not present

## 2021-10-24 DIAGNOSIS — R634 Abnormal weight loss: Secondary | ICD-10-CM

## 2021-10-24 DIAGNOSIS — I428 Other cardiomyopathies: Secondary | ICD-10-CM | POA: Insufficient documentation

## 2021-10-24 DIAGNOSIS — I340 Nonrheumatic mitral (valve) insufficiency: Secondary | ICD-10-CM

## 2021-10-24 DIAGNOSIS — I77810 Thoracic aortic ectasia: Secondary | ICD-10-CM | POA: Diagnosis not present

## 2021-10-24 DIAGNOSIS — I5032 Chronic diastolic (congestive) heart failure: Secondary | ICD-10-CM | POA: Insufficient documentation

## 2021-10-24 DIAGNOSIS — I4819 Other persistent atrial fibrillation: Secondary | ICD-10-CM

## 2021-10-24 DIAGNOSIS — I272 Pulmonary hypertension, unspecified: Secondary | ICD-10-CM

## 2021-10-24 LAB — CBC
HCT: 42.9 % (ref 36.0–46.0)
Hemoglobin: 13.9 g/dL (ref 12.0–15.0)
MCH: 31.8 pg (ref 26.0–34.0)
MCHC: 32.4 g/dL (ref 30.0–36.0)
MCV: 98.2 fL (ref 80.0–100.0)
Platelets: 160 10*3/uL (ref 150–400)
RBC: 4.37 MIL/uL (ref 3.87–5.11)
RDW: 13.1 % (ref 11.5–15.5)
WBC: 7.4 10*3/uL (ref 4.0–10.5)
nRBC: 0 % (ref 0.0–0.2)

## 2021-10-24 LAB — BASIC METABOLIC PANEL
Anion gap: 5 (ref 5–15)
BUN: 25 mg/dL — ABNORMAL HIGH (ref 8–23)
CO2: 31 mmol/L (ref 22–32)
Calcium: 9.9 mg/dL (ref 8.9–10.3)
Chloride: 100 mmol/L (ref 98–111)
Creatinine, Ser: 0.75 mg/dL (ref 0.44–1.00)
GFR, Estimated: >60 mL/min (ref >60)
Glucose, Bld: 140 mg/dL — ABNORMAL HIGH (ref 70–99)
Potassium: 4.7 mmol/L (ref 3.5–5.1)
Sodium: 136 mmol/L (ref 135–145)

## 2021-10-24 MED ORDER — RIVAROXABAN 15 MG PO TABS: 15.0000 mg | ORAL_TABLET | Freq: Two times a day (BID) | ORAL | 3 refills | Status: DC

## 2021-10-24 MED ORDER — SODIUM CHLORIDE 0.9% FLUSH: 3.0000 mL | Freq: Two times a day (BID) | INTRAVENOUS | Status: AC

## 2021-10-24 MED ORDER — METOPROLOL TARTRATE 25 MG PO TABS: 25.0000 mg | ORAL_TABLET | Freq: Two times a day (BID) | ORAL | 3 refills | Status: DC

## 2021-10-24 MED ORDER — RIVAROXABAN 20 MG PO TABS: 20.0000 mg | ORAL_TABLET | Freq: Every day | ORAL | 3 refills | Status: DC

## 2021-10-24 NOTE — Telephone Encounter (Signed)
Per note, Deborah Baxter has a good palpable pulse, etc.  Was mention about being seen this week.  She has been referred to [place] [facility].  Since I will not be here end of week, please see if someone has opening - to work her in.  She has an appt with me on 11/06/21.  Confirm no foot pain or problems.  Can see if pt agreeable to see someone this week.  Keep appt with me on 11/06/21

## 2021-10-24 NOTE — Telephone Encounter (Signed)
See printed office note from [facility]

## 2021-10-24 NOTE — Patient Instructions (Signed)
Medication Instructions:  Your physician has recommended you make the following change in your medication:   DECREASE Metoprolol tartrate to 25 mg twice a day DECREASE Xarelto to 15 mg once daily   *If you need a refill on your cardiac medications before your next appointment, please call your pharmacy*   Lab Work: [place] today over at the [facility] entrance and check in at registration.   If you have labs (blood work) drawn today and your tests are completely normal, you will receive your results only by: [place] (if you have MyChart) OR A paper copy in the mail If you have any lab test that is abnormal or we need to change your treatment, we will call you to review the results.   Testing/Procedures:   You are scheduled for Cardiac MRI on ______________. Please arrive for your appointment at ______________ ( arrive 30-45 minutes prior to test start time). ?   [facility] [place], [place] [address] [phone] Please take advantage of the free valet parking available at the MAIN entrance. Proceed to [facility] registration for check-in (first floor).  Magnetic resonance imaging (MRI) is a painless test that produces images of the inside of the body without using Xrays.  During an MRI, strong magnets and radio waves work together in a Research officer, political party to form detailed images.   MRI images may provide more details about a medical condition than X-rays, CT scans, and ultrasounds can provide.  You may be given earphones to listen for instructions.  You may eat a light breakfast and take medications as ordered with the exception of HCTZ (fluid pill, other). HOLD Furosemide & Potassium the day of this test. Please avoid stimulants for 12 hr prior to test. (Ie. Caffeine, nicotine, chocolate, or antihistamine medications)  If a contrast material will be used, an IV will be inserted into one of your veins. Contrast material will be  injected into your IV. It will leave your body through your urine within a day. You may be told to drink plenty of fluids to help flush the contrast material out of your system.  You will be asked to remove all metal, including: Watch, jewelry, and other metal objects including hearing aids, hair pieces and dentures. Also wearable glucose monitoring systems (ie. Freestyle Libre and Omnipods) (Braces and fillings normally are not a problem.)   TEST WILL TAKE APPROXIMATELY 1 HOUR  PLEASE NOTIFY SCHEDULING AT LEAST 24 HOURS IN ADVANCE IF YOU ARE UNABLE TO KEEP YOUR APPOINTMENT. [phone]  Please call Marchia Bond, cardiac imaging nurse navigator with any questions/concerns. Marchia Bond RN Navigator Cardiac Imaging Gordy Clement RN Navigator Cardiac Imaging [facility] [phone] Office    [facility] Cardiac Cath Instructions  You are scheduled for a Cardiac Cath on: November 06, 2021 Please arrive at 07:30 am on the day of your procedure Please expect a call from our [place] to pre-register you Do not eat/drink anything after midnight Someone will need to drive you home It is recommended someone be with you for the first 24 hours after your procedure Wear clothes that are easy to get on/off and wear slip on shoes if possible   Medications bring a current list of all medications with you  _XX__ Do not take these medications before your procedure: Furosemide or Potassium that morning.    _XX__ You may take all of your other medications the morning of your procedure  with enough water to swallow safely  Day of your procedure: Arrive at the [place] entrance.  Free valet service is available.  After entering the [place] please check-in at the registration desk (1st desk on your right) to receive your armband. After receiving your armband someone will escort you to the cardiac cath/special procedures waiting area.  The usual length  of stay after your procedure is about 2 to 3 hours.  This can vary.  If you have any questions, please call our office at [phone], or you may call the cardiac cath lab at [facility] directly at [phone]    Follow-Up: At [facility], you and your health needs are our priority.  As part of our continuing mission to provide you with exceptional heart care, we have created designated Provider Care Teams.  These Care Teams include your primary Cardiologist (physician) and Advanced Practice Providers (APPs -  Physician Assistants and Nurse Practitioners) who all work together to provide you with the care you need, when you need it.   Your next appointment:   2 week(s) post cath procedure  The format for your next appointment:   In Person  Provider:   Nelva Bush, MD or Christell Faith, PA-C       Important Information About Sugar

## 2021-10-24 NOTE — Telephone Encounter (Signed)
Spoke with patient and her daughter. Reviewed results and recommendations to remain on Xarelto 20 mg once daily with supper. Advised I would call pharmacy to confirm this change. She was appreciative for the call with no further questions at this time.

## 2021-10-24 NOTE — Telephone Encounter (Signed)
Noted. Thanks.

## 2021-10-24 NOTE — Telephone Encounter (Signed)
Spoke with pharmacist and confirmed changes that patient should remain on Xarelto 20 mg once daily with supper. She read back information and will get prescription ready for patient. No further needs at this time.

## 2021-10-24 NOTE — Telephone Encounter (Signed)
-----   Message from Rise Mu, PA-C sent at 10/24/2021 12:59 PM EDT ----- Random glucose mildly elevated Potassium at goal BUN mildly elevated with improved serum creatinine Blood count normal  Creatinine clearance is now back over 50 with improved serum creatinine on updated labs today.  Please have the patient hold off on taking a lower dose Xarelto and have her continue Xarelto 20 mg.

## 2021-10-25 NOTE — Telephone Encounter (Signed)
S/w [place] - no pain in foot. No issues walking or standing. Pt chose to keep appt 6/6. Does not feel she needs eval at this time.

## 2021-10-26 ENCOUNTER — Other Ambulatory Visit: Payer: Medicare Other

## 2021-10-30 DIAGNOSIS — M5136 Other intervertebral disc degeneration, lumbar region: Secondary | ICD-10-CM | POA: Diagnosis not present

## 2021-10-30 DIAGNOSIS — M9903 Segmental and somatic dysfunction of lumbar region: Secondary | ICD-10-CM | POA: Diagnosis not present

## 2021-10-30 DIAGNOSIS — M955 Acquired deformity of pelvis: Secondary | ICD-10-CM | POA: Diagnosis not present

## 2021-10-30 DIAGNOSIS — M9905 Segmental and somatic dysfunction of pelvic region: Secondary | ICD-10-CM | POA: Diagnosis not present

## 2021-11-02 ENCOUNTER — Ambulatory Visit (INDEPENDENT_AMBULATORY_CARE_PROVIDER_SITE_OTHER): Payer: Medicare Other | Admitting: Pulmonary Disease

## 2021-11-02 ENCOUNTER — Encounter: Payer: Self-pay | Admitting: Pulmonary Disease

## 2021-11-02 VITALS — BP 90/60 | HR 64 | Temp 97.8°F | Ht 67.0 in | Wt 139.4 lb

## 2021-11-02 DIAGNOSIS — J90 Pleural effusion, not elsewhere classified: Secondary | ICD-10-CM

## 2021-11-02 DIAGNOSIS — R0602 Shortness of breath: Secondary | ICD-10-CM | POA: Diagnosis not present

## 2021-11-02 DIAGNOSIS — I272 Pulmonary hypertension, unspecified: Secondary | ICD-10-CM | POA: Diagnosis not present

## 2021-11-02 DIAGNOSIS — I425 Other restrictive cardiomyopathy: Secondary | ICD-10-CM | POA: Diagnosis not present

## 2021-11-02 NOTE — Progress Notes (Signed)
Subjective:    Patient ID: Deborah Baxter, female    DOB: April 02, 1934, 88 y.o.   MRN: [id number] Patient Care Team: Dale Lufkin, MD as PCP - General (Internal Medicine) End, Cristal Deer, MD as PCP - Cardiology (Cardiology)  Chief Complaint  Patient presents with   pulmonary consult    CXR 09/12/21--no current sx.     HPI Deborah Baxter is an 88 year old remote former smoker with minimal tobacco exposure in the past and a history as noted below, who presents for evaluation of a left pleural effusion.  She is kindly referred by Dr. Dale Ecru.  The patient is known to have  issues with chronic heart failure with preserved ejection fraction.  Most recent echocardiographic parameters were consistent with possible restrictive cardiomyopathy.  She is to undergo cardiac MRI for morphology study in July 2023.  There is suspicion for potential cardiac amyloid patient is following up with cardiology with this regard.  She had had shortness of breath, orthopnea and paroxysmal nocturnal dyspnea until her diuretics were adjusted by cardiology.  She has not had any shortness of breath, no wheezing or cough since her diuretics were adjusted.  She has not had any cough or sputum production.  Weight and appetite have been stable. She does not endorse any other complaint today.  I reviewed the chest x-ray obtained in April which actually showed that the left-sided effusion had decreased, this was after diuretic adjustment.  The patient has not had any fevers, chills or sweats.  No hemoptysis.  No chest pain.  I reviewed the echocardiogram results as well as imaging and the chest x-ray imaging as well as noted below.  Discussed all with the patient and her daughter who presents with her today.   DATA 08/17/2021 echocardiogram: LVEF 55 to 60%, SEVERE left ventricular hypertrophy, moderately elevated pulmonary artery systolic pressure, estimated right ventricular systolic pressure of 50.3 mmHg, moderately dilated left  atrium, moderately dilated right atrium, mild to moderate mitral valve regurgitation, moderate tricuspid regurgitation right atrial pressure estimated at 15 mmHg, moderate pericardial effusion without tamponade physiology 09/12/2021 chest x-ray PA and lateral: Cardiomegaly, decreasing size of left-sided effusion (small)   Review of Systems A 10 point review of systems was performed and it is as noted above otherwise negative.  Past Medical History:  Diagnosis Date   (HFpEF) heart failure with preserved ejection fraction (HCC) 04/24/2021   Pulmonary raised concern - question of pulmonary hypertension secondary to DD.  Question of need for right heart cath   [facility]) 04/24/2021   Acute heart failure with preserved ejection fraction (HCC)    Ascending aorta dilatation (HCC) 08/01/2021   Atrial fibrillation with rapid ventricular response (HCC) 03/20/2021   Congestion of nasal sinus 10/13/2021   Cough 04/23/2021   Diverticulosis 09/05/2012   Colonoscopy 08/31/12 - redundant colon and diverticulosis in the sigmoid colon, descending colon, transverse colon and hepatic flexure.  Recommend f/u colonoscopy in 10 years.    Elevated bilirubin 09/13/2021   Elevated blood pressure reading 12/05/2020   Health care maintenance 07/13/2014   Physical 07/12/14.  Colonoscopy 08/31/12.  Normal bone density 04/06/10.  Mammogram 05/30/20- Birads I.     Hx of degenerative disc disease    Hypercholesterolemia    Hypertensive urgency 03/20/2021   Ingrown toenail 09/13/2021   Left hand pain 03/20/2021   Low back pain 01/29/2021   Lumbago 07/13/2014   Lumbar spinal stenosis 06/27/2012   Neuromuscular disorder (HCC)    pinched nerve   Numbness and  tingling in left hand 08/21/2017   Osteoarthritis    Pleural effusion 09/13/2021   Pulmonary edema 03/20/2021   Seasonal allergies    Spinal stenosis    chronic back and leg pain   Stress 02/03/2021   Syncope 12/05/2020   Thrombocytopenia (HCC) 09/13/2021   Past Surgical  History:  Procedure Laterality Date   APPENDECTOMY     bladder tack surgery  2000   BTL  1976   CARDIOVERSION N/A 05/11/2021   Procedure: CARDIOVERSION;  Surgeon: Antonieta Iba, MD;  Location: [facility] ORS;  Service: Cardiovascular;  Laterality: N/A;   CARPAL TUNNEL RELEASE     CATARACT EXTRACTION W/PHACO Left 12/07/2015   Procedure: CATARACT EXTRACTION PHACO AND INTRAOCULAR LENS PLACEMENT (IOC);  Surgeon: Nevada Crane, MD;  Location: [facility] ORS;  Service: Ophthalmology;  Laterality: Left;  [place] 1.23 AP% 23.2 CDE 19.28 Fluid Pack Lot # [id number]   EYE SURGERY  03/19/15   GANGLION CYST EXCISION     right hand   GAS/FLUID EXCHANGE Left 03/08/2015   Procedure: GAS/FLUID EXCHANGE;  Surgeon: Marcelene Butte, MD;  Location: [facility] ORS;  Service: Ophthalmology;  Laterality: Left;   TONSILLECTOMY     TUBAL LIGATION     VITRECTOMY 25 GAUGE WITH SCLERAL BUCKLE Left 03/08/2015   Procedure: VITRECTOMY 25 GAUGE, membrane peel, 26 %  SF6 gas exchange;  Surgeon: Marcelene Butte, MD;  Location: [facility] ORS;  Service: Ophthalmology;  Laterality: Left;  casette lot # [id number]   Patient Active Problem List   Diagnosis Date Noted   Hx of degenerative disc disease 10/23/2021   Neuromuscular disorder (HCC) 10/23/2021   Osteoarthritis 10/23/2021   Seasonal allergies 10/23/2021   Spinal stenosis 10/23/2021   Congestion of nasal sinus 10/13/2021   Elevated bilirubin 09/13/2021   Thrombocytopenia (HCC) 09/13/2021   Pleural effusion 09/13/2021   Ingrown toenail 09/13/2021   Ascending aorta dilatation (HCC) 08/01/2021   A-fib (HCC) 04/24/2021   (HFpEF) heart failure with preserved ejection fraction (HCC) 04/24/2021   Cough 04/23/2021   Acute heart failure with preserved ejection fraction [facility])    Atrial fibrillation with rapid ventricular response (HCC) 03/20/2021   Hypertensive urgency 03/20/2021   Pulmonary edema 03/20/2021   Left hand pain 03/20/2021   Stress 02/03/2021   Low back pain 01/29/2021    Syncope 12/05/2020   Elevated blood pressure reading 12/05/2020   Numbness and tingling in left hand 08/21/2017   Lumbago 07/13/2014   Health care maintenance 07/13/2014   Diverticulosis 09/05/2012   Hypercholesterolemia 06/27/2012   Lumbar spinal stenosis 06/27/2012   Family History  Problem Relation Age of Onset   Diabetes Mother    Heart disease Father        Unknown age of onset. Unknown if had CHF or heart attack.   Leukemia Father    Breast cancer Sister        in her 13's   Cancer Sister        lung cancer   Thyroid cancer Sister    Breast cancer Maternal Aunt    Colon cancer Neg Hx    Social History   Tobacco Use   Smoking status: Former   Smokeless tobacco: Never  Substance Use Topics   Alcohol use: Yes    Alcohol/week: 0.0 standard drinks    Comment: occassional   Allergies  Allergen Reactions   Quinolones Other (See Comments)    Avoid FQ with Ao dilation  *Pt is unaware of allergy    Current Meds  Medication  Sig   acetaminophen (TYLENOL) 650 MG CR tablet Take 650 mg by mouth every 8 (eight) hours as needed for pain. Reported on 08/02/2015   furosemide (LASIX) 40 MG tablet Take 1 tablet (40 mg total) by mouth daily. TAKE 1 TO 2 TABLETS BY MOUTH EVERY DAY AS DIRECTED   losartan (COZAAR) 25 MG tablet Take 25 mg by mouth daily.   metoprolol tartrate (LOPRESSOR) 25 MG tablet Take 1 tablet (25 mg total) by mouth 2 (two) times daily.   Multiple Vitamin (MULTIVITAMIN) tablet Take 1 tablet by mouth daily.   OVER THE COUNTER MEDICATION Place 1 drop into both eyes every 8 (eight) hours as needed (dry eyes.). Lubricant eye drops   Polyethylene Glycol 3350 (MIRALAX PO) Take 17 g by mouth every 3 (three) days. Every 3 days   potassium chloride SA (KLOR-CON M) 20 MEQ tablet Take 20 mEq by mouth every other day.   rivaroxaban (XARELTO) 20 MG TABS tablet Take 1 tablet (20 mg total) by mouth daily with supper.   [DISCONTINUED] predniSONE (DELTASONE) 5 MG tablet Take 30 mg by  mouth once. Then taper dose down daily   Immunization History  Administered Date(s) Administered   Influenza Split 02/15/2014   Influenza, High Dose Seasonal PF 03/05/2018, 03/16/2019   Influenza-Unspecified 03/04/2012, 03/24/2013, 03/08/2014, 02/20/2015, 03/05/2016, 03/11/2017, 03/15/2019, 04/24/2020   Moderna Sars-Covid-2 Vaccination 06/25/2019, 07/23/2019, 05/08/2020   Pneumococcal Conjugate-13 07/08/2013   Pneumococcal Polysaccharide-23 07/12/2014   Zoster Recombinat (Shingrix) 04/13/2019, 09/10/2019        Objective:   Physical Exam BP 90/60 (BP Location: Left Arm, Cuff Size: Normal)   Pulse 64   Temp 97.8 F (36.6 C) (Temporal)   Ht 5\' 7"  (1.702 m)   Wt 139 lb 6.4 oz (63.2 kg)   LMP 06/26/1985   SpO2 95%   BMI 21.83 kg/m  GENERAL: Well-developed, well-nourished elderly woman in no acute distress.  She presents in transport chair.  No conversational dyspnea. HEAD: Normocephalic, atraumatic.  EYES: Pupils equal, round, reactive to light.  No scleral icterus.  MOUTH: Oral mucosa moist, no thrush. NECK: Supple. No thyromegaly. Trachea midline. No JVD.  No adenopathy. PULMONARY: Good air entry bilaterally.  No adventitious sounds. CARDIOVASCULAR: S1 and S2. Regular rate and rhythm.  Grade 2/6 to 3/6 systolic ejection murmur consistent with mitral regurgitation. ABDOMEN: Benign. MUSCULOSKELETAL: No joint deformity, no clubbing, no edema.  NEUROLOGIC: Grossly nonfocal exam.  Gait not tested. SKIN: Intact,warm,dry. PSYCH: Mood and behavior normal.  Chest x-ray from 12 September 2021 showing minimal to no effusion left base this could constitute chronic scarring, nothing to tap:   I have reviewed echocardiogram images.    Assessment & Plan:     ICD-10-CM   1. Pleural effusion -LEFT  J90    Effusion is small and not amenable to thoracentesis Improving with diuretics Likely related to CHF    2. Pulmonary hypertension (HCC)  I27.20 Pulse oximetry, overnight   This issue  adds complexity to her management Continue workup per cardiology    3. Restrictive cardiomyopathy (HCC)  I42.5    Patient to have cardiac MRI in July Suspect amyloid    4. Shortness of breath  R06.02    Markedly improved after diuretics adjusted Likely related to CHF     Orders Placed This Encounter  Procedures   Pulse oximetry, overnight    On room air  DME:new start    Standing Status:   Future    Standing Expiration Date:   11/03/2022  Given the patient's issues with pulmonary hypertension will obtain overnight oximetry to exclude potential nocturnal hypoxemia related to pulmonary hypertension.  We will see the patient in follow-up after cardiac MRI is performed.  She is to contact us prior to that time should any new difficulties arise.  Gailen Shelter, MD Advanced Bronchoscopy PCCM [facility]    *This note was dictated using voice recognition software/Dragon.  Despite best efforts to proofread, errors can occur which can change the meaning. Any transcriptional errors that result from this process are unintentional and may not be fully corrected at the time of dictation.

## 2021-11-02 NOTE — Patient Instructions (Signed)
I think it is important to go ahead with a cardiac catheterization that has been scheduled.  This will give [place] quite a few answers with regards to what is going on with your heart.  The findings on your chest x-ray were related to fluid that has accumulated because of heart dysfunction.  The last x-ray you had showed only very minimal amount of fluid if any and this may be just some mild scarring left over.  There is nothing to drain or tap.  It appears that this has improved with the fluid pills so keep taking these as directed by your cardiology team.  We will order an oxygen level overnight this will tell us if you need oxygen supplementation at nighttime.  We will see you in follow-up in 2 to 3 months time call sooner should any new problems arise.

## 2021-11-06 ENCOUNTER — Encounter: Payer: Self-pay | Admitting: Internal Medicine

## 2021-11-06 ENCOUNTER — Ambulatory Visit: Payer: Medicare Other | Admitting: Internal Medicine

## 2021-11-06 ENCOUNTER — Encounter
Admission: RE | Disposition: A | Discharge status: Home / Self Care | Payer: Self-pay | Source: Home / Self Care | Attending: Internal Medicine

## 2021-11-06 ENCOUNTER — Ambulatory Visit
Admission: RE | Admit: 2021-11-06 | Discharge: 2021-11-06 | Disposition: A | Discharge status: Home / Self Care | Payer: Medicare Other | Attending: Internal Medicine | Admitting: Internal Medicine

## 2021-11-06 ENCOUNTER — Other Ambulatory Visit: Payer: Self-pay | Admitting: Family

## 2021-11-06 ENCOUNTER — Other Ambulatory Visit: Payer: Self-pay

## 2021-11-06 DIAGNOSIS — E785 Hyperlipidemia, unspecified: Secondary | ICD-10-CM | POA: Insufficient documentation

## 2021-11-06 DIAGNOSIS — I3139 Other pericardial effusion (noninflammatory): Secondary | ICD-10-CM | POA: Insufficient documentation

## 2021-11-06 DIAGNOSIS — Z7901 Long term (current) use of anticoagulants: Secondary | ICD-10-CM | POA: Insufficient documentation

## 2021-11-06 DIAGNOSIS — R634 Abnormal weight loss: Secondary | ICD-10-CM | POA: Insufficient documentation

## 2021-11-06 DIAGNOSIS — Z87891 Personal history of nicotine dependence: Secondary | ICD-10-CM | POA: Diagnosis not present

## 2021-11-06 DIAGNOSIS — Z6821 Body mass index (BMI) 21.0-21.9, adult: Secondary | ICD-10-CM | POA: Insufficient documentation

## 2021-11-06 DIAGNOSIS — I428 Other cardiomyopathies: Secondary | ICD-10-CM | POA: Diagnosis not present

## 2021-11-06 DIAGNOSIS — Z79899 Other long term (current) drug therapy: Secondary | ICD-10-CM | POA: Insufficient documentation

## 2021-11-06 DIAGNOSIS — M4800 Spinal stenosis, site unspecified: Secondary | ICD-10-CM | POA: Insufficient documentation

## 2021-11-06 DIAGNOSIS — I110 Hypertensive heart disease with heart failure: Secondary | ICD-10-CM | POA: Diagnosis not present

## 2021-11-06 DIAGNOSIS — I080 Rheumatic disorders of both mitral and aortic valves: Secondary | ICD-10-CM | POA: Insufficient documentation

## 2021-11-06 DIAGNOSIS — I272 Pulmonary hypertension, unspecified: Secondary | ICD-10-CM | POA: Diagnosis not present

## 2021-11-06 DIAGNOSIS — I4819 Other persistent atrial fibrillation: Secondary | ICD-10-CM | POA: Diagnosis not present

## 2021-11-06 DIAGNOSIS — I5032 Chronic diastolic (congestive) heart failure: Secondary | ICD-10-CM | POA: Diagnosis not present

## 2021-11-06 HISTORY — PX: RIGHT HEART CATH: CATH118263

## 2021-11-06 SURGERY — RIGHT HEART CATH
Anesthesia: Moderate Sedation

## 2021-11-06 MED ORDER — SODIUM CHLORIDE 0.9 % IV SOLN: 250.0000 mL | INTRAVENOUS | Status: DC | PRN

## 2021-11-06 MED ORDER — ACETAMINOPHEN 325 MG PO TABS: 650.0000 mg | ORAL_TABLET | ORAL | Status: DC | PRN

## 2021-11-06 MED ORDER — SODIUM CHLORIDE 0.9% FLUSH: 3.0000 mL | Freq: Two times a day (BID) | INTRAVENOUS | Status: DC

## 2021-11-06 MED ORDER — HEPARIN (PORCINE) IN NACL 1000-0.9 UT/500ML-% IV SOLN
INTRAVENOUS | Status: AC
  Filled 2021-11-06: qty 500

## 2021-11-06 MED ORDER — SODIUM CHLORIDE 0.9% FLUSH: 3.0000 mL | INTRAVENOUS | Status: DC | PRN

## 2021-11-06 MED ORDER — LIDOCAINE HCL 1 % IJ SOLN
INTRAMUSCULAR | Status: AC
Start: 2021-11-06 — End: ?
  Filled 2021-11-06: qty 20

## 2021-11-06 MED ORDER — SODIUM CHLORIDE 0.9 % IV SOLN: INTRAVENOUS | Status: DC

## 2021-11-06 MED ORDER — LIDOCAINE HCL (PF) 1 % IJ SOLN
INTRAMUSCULAR | Status: DC | PRN
  Administered 2021-11-06: 2 mL

## 2021-11-06 MED ORDER — HEPARIN (PORCINE) IN NACL 1000-0.9 UT/500ML-% IV SOLN
INTRAVENOUS | Status: DC | PRN
  Administered 2021-11-06: 500 mL

## 2021-11-06 MED ORDER — ONDANSETRON HCL 4 MG/2ML IJ SOLN: 4.0000 mg | Freq: Four times a day (QID) | INTRAMUSCULAR | Status: DC | PRN

## 2021-11-06 MED ORDER — HYDRALAZINE HCL 20 MG/ML IJ SOLN: 10.0000 mg | INTRAMUSCULAR | Status: DC | PRN

## 2021-11-06 SURGICAL SUPPLY — 7 items
CATH BALLN WEDGE 5F 110CM (CATHETERS) ×1 IMPLANT
CATH INFINITI JR4 5F (CATHETERS) IMPLANT
DRAPE BRACHIAL (DRAPES) ×1 IMPLANT
GUIDEWIRE .025 260CM (WIRE) ×1 IMPLANT
KIT RIGHT HEART ACIST (MISCELLANEOUS) ×1 IMPLANT
PACK CARDIAC CATH (CUSTOM PROCEDURE TRAY) ×2 IMPLANT
SHEATH GLIDE SLENDER 4/5FR (SHEATH) ×1 IMPLANT

## 2021-11-06 NOTE — Discharge Instructions (Signed)
Remove dressing right brachial (bend of arm) tomorrow. In event it starts bleeding hold pressure for ten minutes. Then redress if needed. You didn't receive sedation today.

## 2021-11-06 NOTE — Interval H&P Note (Signed)
History and Physical Interval Note:  11/06/2021 8:35 AM  Deborah Baxter  has presented today for surgery, with the diagnosis of HFpEF and pulmonary hypertension.  The various methods of treatment have been discussed with the patient and family. After consideration of risks, benefits and other options for treatment, the patient has consented to  Procedure(s): RIGHT HEART CATH (N/A) as a surgical intervention.  The patient's history has been reviewed, patient examined, no change in status, stable for surgery.  I have reviewed the patient's chart and labs.  Questions were answered to the patient's satisfaction.     Victor Langenbach

## 2021-11-08 DIAGNOSIS — M5416 Radiculopathy, lumbar region: Secondary | ICD-10-CM | POA: Diagnosis not present

## 2021-11-08 DIAGNOSIS — M48062 Spinal stenosis, lumbar region with neurogenic claudication: Secondary | ICD-10-CM | POA: Diagnosis not present

## 2021-11-13 NOTE — Progress Notes (Signed)
Cardiology Office Note    Date:  11/15/2021   ID:  Deborah Baxter, DOB 08/08/1933, MRN [id number]  PCP:  Einar Pheasant, MD  Cardiologist:  Nelva Bush, MD  Electrophysiologist:  None   Chief Complaint: Follow-up  History of Present Illness:   Deborah Baxter is a 88 y.o. female with history of persistent A-fib status post briefly successful DCCV on 05/11/2021, HFpEF, possible infiltrative cardiomyopathy, pericardial effusion, pleural effusion, mild pulmonary hypertension, syncope, HTN, HLD, osteoarthritis, prior tobacco use, and spinal stenosis who presents for follow-up of A. fib and HFpEF.   Prior ED evaluation for syncope in 11/2020 with negative orthostatics and largely unrevealing labs.  Cardiac enzymes not cycled.  EKG unavailable for review.   She was admitted to [facility] fin 01/2021 with acute HFpEF, A. fib with RVR of uncertain chronicity (initially noted to be in A-fib in 03/2021), and moderate pericardial effusion.  EKG demonstrated A. fib with RVR, 121 bpm, low voltage QRS, and nonspecific ST-T changes.  High-sensitivity troponin of 28 with a delta of 17.  BNP 335.  Echo demonstrated a low normal LV systolic function with an EF of 50 to 55%, no regional wall motion abnormalities, moderate LVH, LV diastolic function parameters could not be evaluated, mildly reduced RV systolic function with normal ventricular cavity size, normal PASP, moderate biatrial enlargement, moderate pericardial effusion that was posterior to the LV without evidence of cardiac tamponade, degenerative mitral valve with moderate regurgitation and moderate to severe mitral annular calcification, moderate tricuspid regurgitation, tricuspid aortic valve with trivial insufficiency and mild aortic valve sclerosis without evidence of stenosis, mildly dilated ascending aorta measuring 41 mm, and an estimated right atrial pressure of 8 mmHg.  Given moderate LVH, biatrial enlargement, and low voltage on EKG, infiltrative  cardiomyopathy was recommended to be considered.  She responded well to IV diuresis.  With regards to her A. fib, she was rate controlled with beta-blocker and initiated on Xarelto.   She was seen in hospital follow-up on 03/30/2021 and was doing reasonably well without tachypalpitations and a noted improvement in her energy.  She still noted some exertional shortness of breath.  She reported adherence to medications and was without symptoms of bleeding.  She remained in A. fib with a ventricular rate of 94 bpm on EKG.  She was felt to be still mildly volume up with recommendation to briefly increase furosemide.  Subsequent repeat limited echo on 05/02/2021 demonstrated an EF of 50%, moderate LVH, normal RV systolic function and ventricular cavity size, stable moderate pericardial effusion that was posterior and lateral to the left ventricle without evidence of cardiac tamponade, moderate pleural effusion in the left lateral region, mild mitral regurgitation, trivial aortic insufficiency, mildly dilated ascending aorta measuring 41 mm, and an estimated right atrial pressure of 8 mmHg.   She was evaluated by her PCP in late 04/2021 with URI-like symptoms.  Lasix was again briefly increased.  Chest x-ray showed chronic scarring within the left lung base and no new focal abnormality.   She was seen in follow-up on 05/04/2021 and reported her breathing was largely stable, though did continue to note exertional dyspnea.  Her URI symptoms were improved.  She was without chest pain or palpitations.  Her weights had trended from 163 to 169 pounds.  She was taking furosemide 40 mg daily.  She remained in rate controlled A. fib and had not missed any doses of anticoagulation.  No bleeding concerns.  Her Lasix was again briefly titrated.  She underwent successful DCCV without complication on 81/09/4816 following 1 synchronized cardioversion at 150 J with postprocedure EKG demonstrating NSR.   She was seen in the on  05/18/2021 and was doing well from a cardiac perspective.  She had redeveloped A-fib with controlled ventricular response.  She was asymptomatic regarding this.  Her weight was down 6 pounds by our scale.  She was without symptoms of angina or decompensation.  Rate control strategy was pursued given age and she was asymptomatic in A-fib.  With regards to her possible infiltrative cardiomyopathy, patient and family preferred to defer further evaluation of this.  Repeat limited echo on 05/31/2021 demonstrated an EF of 60 to 65%, no regional wall motion abnormalities, severe concentric LVH, indeterminate LV diastolic function parameters, normal RV systolic function and ventricular cavity size, mildly elevated PASP estimated at 38.6 mmHg, moderate biatrial enlargement, moderate pericardial effusion posterior to the LV without evidence of tamponade, mild to moderate mitral regurgitation, moderate mitral annular calcification, moderate tricuspid regurgitation, mild aortic insufficiency, aortic valve sclerosis without evidence of stenosis, mildly dilated ascending aorta measuring 40 mm, and a rhythm of A-fib/flutter.   She was evaluated by her PCP in late 07/2021 with an increase in shortness of breath.  Lasix was briefly titrated.  Chest x-ray showed chronic left base scarring and possible chronic small pleural effusion without new consolidation.  There was also diffuse chronic interstitial coarsening.  Overall, findings were stable when compared to prior plain film.   She underwent repeat limited echo on 08/17/2021 which showed an EF of 55 to 60%, no regional wall motion abnormalities, severe LVH, indeterminate LV diastolic function parameters, normal RV systolic function and ventricular cavity size, moderately elevated PASP estimated at 50.3 mmHg, moderate biatrial enlargement, mild to moderate mitral regurgitation, moderate tricuspid regurgitation, and estimated right atrial pressure of 15 mmHg, and a stable moderate  pericardial effusion without evidence of tamponade.  Given these findings, it was recommended she increase her furosemide to 40 mg twice daily.   She was seen in the office on 08/22/2021, noting stable intermittent shortness of breath when ambulating to the mailbox.  She was without symptoms of angina or decompensation.  Her weight was stable.  Furosemide was reduced back to 40 mg daily.  She was last seen in the office in 10/2020 and was without symptoms of angina or decompensation.  Her weight was down from 164 pounds to 138 pounds when compared to her visit in 08/2021.  This weight loss was unintentional.  There was also some concern regarding lower extremity hyperpigmentation that improved with leg elevation with noted normal pulses bilaterally.  She was also noted to have spontaneously converted to sinus rhythm with metoprolol being decreased to 25 mg twice daily secondary to relative hypotension.  She was evaluated by pulmonology on 11/02/2021 with recommendations for overnight pulse oximetry and to move forward with RHC.  She underwent RHC on 11/06/2021 which demonstrated normal left and right heart filling pressures with a mean RA pressure of 5 mmHg and a PCWP of 15 mmHg.  Borderline pulmonary hypertension with a mean PA pressure of 21 mmHg.  Normal cardiac output and index.  Cardiac MRI is scheduled for 12/12/2021.  She comes in today accompanied by one of her daughters and is doing well from a cardiac perspective, without symptoms of angina or decompensation.  She notes no significant shortness of breath.  No lower extremity swelling, abdominal distention, orthopnea.  She also notes an improvement in her dyspnea when ambulating to  the mailbox.  No falls or symptoms concerning for bleeding.  Her weight loss has stabilized with a weight that is up 3 pounds today by office scale when compared to her visit last month.  She does note some soft blood pressures at home with readings largely in the 90s to low  962I systolic with a rare reading in the 29N systolic.  She denies any significant dizziness, presyncope, syncope, or lethargy with these readings.   Labs independently reviewed: 10/2021 - Hgb 13.9, PLT 160, potassium 4.7, BUN 25, serum creatinine 0.75 09/2021 - albumin 3.4, AST/ALT normal 07/2021 - TC 142, TG 52, HDL 68, LDL 63, TSH normal  Past Medical History:  Diagnosis Date   (HFpEF) heart failure with preserved ejection fraction ([place]) 04/24/2021   Pulmonary raised concern - question of pulmonary hypertension secondary to DD.  Question of need for right heart cath   A-fib [facility]) 04/24/2021   Acute heart failure with preserved ejection fraction (HCC)    Ascending aorta dilatation ([place]) 08/01/2021   Atrial fibrillation with rapid ventricular response ([place]) 03/20/2021   Congestion of nasal sinus 10/13/2021   Cough 04/23/2021   Diverticulosis 09/05/2012   Colonoscopy 08/31/12 - redundant colon and diverticulosis in the sigmoid colon, descending colon, transverse colon and hepatic flexure.  Recommend f/u colonoscopy in 10 years.    Elevated bilirubin 09/13/2021   Elevated blood pressure reading 12/05/2020   Health care maintenance 07/13/2014   Physical 07/12/14.  Colonoscopy 08/31/12.  Normal bone density 04/06/10.  Mammogram 05/30/20- Birads I.     Hx of degenerative disc disease    Hypercholesterolemia    Hypertensive urgency 03/20/2021   Ingrown toenail 09/13/2021   Left hand pain 03/20/2021   Low back pain 01/29/2021   Lumbago 07/13/2014   Lumbar spinal stenosis 06/27/2012   Neuromuscular disorder (HCC)    pinched nerve   Numbness and tingling in left hand 08/21/2017   Osteoarthritis    Pleural effusion 09/13/2021   Pulmonary edema 03/20/2021   Seasonal allergies    Spinal stenosis    chronic back and leg pain   Stress 02/03/2021   Syncope 12/05/2020   Thrombocytopenia ([place]) 09/13/2021    Past Surgical History:  Procedure Laterality Date   APPENDECTOMY     bladder tack surgery  2000   BTL   1976   CARDIOVERSION N/A 05/11/2021   Procedure: CARDIOVERSION;  Surgeon: Minna Merritts, MD;  Location: [facility] ORS;  Service: Cardiovascular;  Laterality: N/A;   CARPAL TUNNEL RELEASE     CATARACT EXTRACTION W/PHACO Left 12/07/2015   Procedure: CATARACT EXTRACTION PHACO AND INTRAOCULAR LENS PLACEMENT ([place]);  Surgeon: Eulogio Bear, MD;  Location: [facility] ORS;  Service: Ophthalmology;  Laterality: Left;  [place] 1.23 AP% 23.2 CDE 19.28 Fluid Pack Lot # [id number]   EYE SURGERY  03/19/15   GANGLION CYST EXCISION     right hand   GAS/FLUID EXCHANGE Left 03/08/2015   Procedure: GAS/FLUID EXCHANGE;  Surgeon: Milus Height, MD;  Location: [facility] ORS;  Service: Ophthalmology;  Laterality: Left;   RIGHT HEART CATH N/A 11/06/2021   Procedure: RIGHT HEART CATH;  Surgeon: Nelva Bush, MD;  Location: [place] [facility];  Service: Cardiovascular;  Laterality: N/A;   TONSILLECTOMY     TUBAL LIGATION     VITRECTOMY 25 GAUGE WITH SCLERAL BUCKLE Left 03/08/2015   Procedure: VITRECTOMY 25 GAUGE, membrane peel, 26 %  SF6 gas exchange;  Surgeon: Milus Height, MD;  Location: [facility] ORS;  Service: Ophthalmology;  Laterality:  Left;  casette lot # [id number]    Current Medications: Current Meds  Medication Sig   acetaminophen (TYLENOL) 650 MG CR tablet Take 650 mg by mouth every 8 (eight) hours as needed for pain. Reported on 08/02/2015   furosemide (LASIX) 40 MG tablet TAKE 1 TO 2 TABLETS BY MOUTH EVERY DAY AS DIRECTED   metoprolol tartrate (LOPRESSOR) 25 MG tablet Take 1 tablet (25 mg total) by mouth 2 (two) times daily.   Multiple Vitamin (MULTIVITAMIN) tablet Take 1 tablet by mouth daily.   OVER THE COUNTER MEDICATION Place 1 drop into both eyes every 8 (eight) hours as needed (dry eyes.). Lubricant eye drops   Polyethylene Glycol 3350 (MIRALAX PO) Take 17 g by mouth every 3 (three) days. Every 3 days   potassium chloride SA (KLOR-CON M) 20 MEQ tablet Take 20 mEq by mouth every other day.   [DISCONTINUED]  losartan (COZAAR) 25 MG tablet Take 25 mg by mouth daily.   [DISCONTINUED] rivaroxaban (XARELTO) 20 MG TABS tablet Take 1 tablet (20 mg total) by mouth daily with supper.   Current Facility-Administered Medications for the 11/15/21 encounter (Office Visit) with Rise Mu, PA-C  Medication   sodium chloride flush (NS) 0.9 % injection 3 mL    Allergies:   Quinolones   Social History   Socioeconomic History   Marital status: Widowed    Spouse name: Not on file   Number of children: 5   Years of education: Not on file   Highest education level: Not on file  Occupational History   Not on file  Tobacco Use   Smoking status: Former    Packs/day: 0.25    Years: 10.00    Total pack years: 2.50    Types: Cigarettes    Quit date: 42    Years since quitting: 50.4   Smokeless tobacco: Never  Vaping Use   Vaping Use: Never used  Substance and Sexual Activity   Alcohol use: Yes    Alcohol/week: 0.0 standard drinks of alcohol    Comment: occassional   Drug use: No   Sexual activity: Not on file  Other Topics Concern   Not on file  Social History Narrative   Not on file   Social Determinants of Health   Financial Resource Strain: Low Risk  (08/20/2021)   Overall Financial Resource Strain (CARDIA)    Difficulty of Paying Living Expenses: Not hard at all  Food Insecurity: No Food Insecurity (08/20/2021)   Hunger Vital Sign    Worried About Running Out of Food in the Last Year: Never true    Ran Out of Food in the Last Year: Never true  Transportation Needs: No Transportation Needs (08/20/2021)   PRAPARE - Hydrologist (Medical): No    Lack of Transportation (Non-Medical): No  Physical Activity: Insufficiently Active (08/17/2020)   Exercise Vital Sign    Days of Exercise per Week: 3 days    Minutes of Exercise per Session: 20 min  Stress: No Stress Concern Present (08/20/2021)   [place]    Feeling of Stress : Not at all  Social Connections: Unknown (08/20/2021)   Social Connection and Isolation Panel [NHANES]    Frequency of Communication with Friends and Family: More than three times a week    Frequency of Social Gatherings with Friends and Family: More than three times a week    Attends Religious Services: More than 4  times per year    Active Member of Clubs or Organizations: Not on file    Attends Archivist Meetings: Not on file    Marital Status: Widowed     Family History:  The patient's family history includes Breast cancer in her maternal aunt and sister; Cancer in her sister; Diabetes in her mother; Heart disease in her father; Leukemia in her father; Thyroid cancer in her sister. There is no history of Colon cancer.  ROS:   12-point review of systems is negative unless otherwise noted in the HPI.   EKGs/Labs/Other Studies Reviewed:    Studies reviewed were summarized above. The additional studies were reviewed today:  [place] 11/06/2021: Conclusions: Normal left and right heart filling pressures (mean RAP 5 mmHg, PCWP 15 mmHg). Borderline pulmonary hypertension (mean PAP 21 mmHg). Normal Fick cardiac output/index.   Recommendations: Continue medical therapy to maintain net even fluid balance. __________  2D echo 03/20/2021: 1. Left ventricular ejection fraction, by estimation, is 50 to 55%. The  left ventricle has low normal function. The left ventricle has no regional  wall motion abnormalities. There is moderate left ventricular hypertrophy.  Left ventricular diastolic  function could not be evaluated.   2. Right ventricular systolic function is mildly reduced. The right  ventricular size is normal. There is normal pulmonary artery systolic  pressure.   3. Left atrial size was moderately dilated.   4. Right atrial size was moderately dilated.   5. Moderate pericardial effusion. The pericardial effusion is posterior  to the  left ventricle. There is no evidence of cardiac tamponade.   6. The mitral valve is degenerative. Moderate mitral valve regurgitation.  Moderate to severe mitral annular calcification.   7. Tricuspid valve is mildly thickened. The tricuspid valve is abnormal.  Tricuspid valve regurgitation is moderate.   8. The aortic valve is tricuspid. There is mild thickening of the aortic  valve. Aortic valve regurgitation is trivial. Mild aortic valve sclerosis  is present, with no evidence of aortic valve stenosis.   9. There is mild dilatation of the ascending aorta, measuring 41 mm.  10. The inferior vena cava is normal in size with <50% respiratory  variability, suggesting right atrial pressure of 8 mmHg.   Conclusion(s)/Recommendation(s): Given moderate LVH, biatrial enlargement,  and low voltage on EKG, infiltrative cardiomyopathy, including  amyloidosis, should be considered.  __________   Limited echo 05/02/2021: 1. Left ventricular ejection fraction, by estimation, is 50%. There is  moderate left ventricular hypertrophy.   2. Right ventricular systolic function is normal. The right ventricular  size is normal.   3. Moderate pericardial effusion. The pericardial effusion is posterior  and lateral to the left ventricle. There is no evidence of cardiac  tamponade. Moderate pleural effusion in the left lateral region.   4. Mild mitral valve regurgitation.   5. The aortic valve is tricuspid. Aortic valve regurgitation is trivial.   6. Aortic dilatation noted. There is mild dilatation of the ascending  aorta, measuring 41 mm.   7. The inferior vena cava is normal in size with <50% respiratory  variability, suggesting right atrial pressure of 8 mmHg.  __________   Limited echo 05/31/2021: 1. Left ventricular ejection fraction, by estimation, is 60 to 65%. The  left ventricle has normal function. The left ventricle has no regional  wall motion abnormalities. There is severe concentric left  ventricular  hypertrophy. Left ventricular diastolic   parameters are indeterminate.   2. Right ventricular systolic function is  normal. The right ventricular  size is normal. There is mildly elevated pulmonary artery systolic  pressure. The estimated right ventricular systolic pressure is 93.7 mmHg.   3. Left atrial size was moderately dilated.   4. Right atrial size was moderately dilated.   5. Moderate pericardial effusion in the posterior LV region, estimated  1.69 -2.00 cm, otherwise mild circumferential effusion No tamponade.   6. The mitral valve is normal in structure. Mild to moderate mitral valve  regurgitation. No evidence of mitral stenosis. Moderate mitral annular  calcification.   7. Tricuspid valve regurgitation is moderate.   8. The aortic valve is normal in structure. Aortic valve regurgitation is  mild. Aortic valve sclerosis/calcification is present, without any  evidence of aortic stenosis.   9. The inferior vena cava is normal in size with greater than 50%  respiratory variability, suggesting right atrial pressure of 3 mmHg.  10. There is mild dilatation of the ascending aorta, measuring 40 mm.  11. Rhythm apppears to be atrial fib/flutter. __________   Limited echo 08/17/2021: 1. Left ventricular ejection fraction, by estimation, is 55 to 60%. The  left ventricle has normal function. The left ventricle has no regional  wall motion abnormalities. There is severe left ventricular hypertrophy.  Left ventricular diastolic parameters   are indeterminate.   2. Right ventricular systolic function is normal. The right ventricular  size is normal. There is moderately elevated pulmonary artery systolic  pressure. The estimated right ventricular systolic pressure is 90.2 mmHg.   3. Left atrial size was moderately dilated.   4. Right atrial size was moderately dilated.   5. The mitral valve is normal in structure. Mild to moderate mitral valve  regurgitation. No evidence  of mitral stenosis.   6. Tricuspid valve regurgitation is moderate.   7. The aortic valve is tricuspid. Aortic valve regurgitation is not  visualized. No aortic stenosis is present.   8. The inferior vena cava is dilated in size with <50% respiratory  variability, suggesting right atrial pressure of 15 mmHg.   9. Moderate pericardial effusion , estimated 1.3 to 2.6 cm off the LV  free wall, mild to moderate off the RV free wall (1.6 cm). No tamponade  noted.   EKG:  EKG is ordered today.  The EKG ordered today demonstrates NSR, 60 bpm, left axis deviation, T wave inversion in lead V2 consistent with prior tracing  Recent Labs: 03/20/2021: B Natriuretic Peptide 335.6 07/31/2021: TSH 2.75 09/12/2021: ALT 27 10/24/2021: BUN 25; Creatinine, Ser 0.75; Hemoglobin 13.9; Platelets 160; Potassium 4.7; Sodium 136  Recent Lipid Panel    Component Value Date/Time   CHOL 142 07/31/2021 1104   TRIG 52.0 07/31/2021 1104   HDL 68.60 07/31/2021 1104   CHOLHDL 2 07/31/2021 1104   VLDL 10.4 07/31/2021 1104   LDLCALC 63 07/31/2021 1104   LDLDIRECT 104.0 08/12/2017 0932    PHYSICAL EXAM:    VS:  BP 126/70   Pulse 60   Ht '5\' 7"'$  (1.702 m)   Wt 141 lb 3.2 oz (64 kg)   LMP 06/26/1985   SpO2 96%   BMI 22.12 kg/m   BMI: Body mass index is 22.12 kg/m.  Physical Exam Vitals reviewed.  Constitutional:      Appearance: She is well-developed.  HENT:     Head: Normocephalic and atraumatic.  Eyes:     General:        Right eye: No discharge.        Left eye: No  discharge.  Neck:     Vascular: No JVD.  Cardiovascular:     Rate and Rhythm: Normal rate and regular rhythm.     Pulses:          Posterior tibial pulses are 2+ on the right side and 2+ on the left side.     Heart sounds: S1 normal and S2 normal. Heart sounds not distant. No midsystolic click and no opening snap. Murmur heard.     Systolic murmur is present with a grade of 1/6 at the upper right sternal border.     No friction rub.   Pulmonary:     Effort: Pulmonary effort is normal. No respiratory distress.     Breath sounds: Normal breath sounds. No decreased breath sounds, wheezing or rales.  Chest:     Chest wall: No tenderness.  Abdominal:     General: There is no distension.     Palpations: Abdomen is soft.  Musculoskeletal:     Cervical back: Normal range of motion.     Right lower leg: No edema.     Left lower leg: No edema.  Skin:    General: Skin is warm and dry.     Nails: There is no clubbing.  Neurological:     Mental Status: She is alert and oriented to person, place, and time.  Psychiatric:        Speech: Speech normal.        Behavior: Behavior normal.        Thought Content: Thought content normal.        Judgment: Judgment normal.     Wt Readings from Last 3 Encounters:  11/15/21 141 lb 3.2 oz (64 kg)  11/06/21 140 lb (63.5 kg)  11/02/21 139 lb 6.4 oz (63.2 kg)     ASSESSMENT & PLAN:   HFpEF with possible infiltrative cardiomyopathy/pulmonary hypertension: She appears euvolemic and well compensated.  Her weight loss has stabilized.  She remains on furosemide 40 mg once daily with RHC demonstrating normal left and right heart filling pressures with mild pulmonary hypertension as outlined above.  Cardiac MRI is scheduled for next month.  Based on results, we will adjust medications accordingly.  She would prefer to focus on quality and at this time does not plan on pursuing aggressive/invasive therapies.  Persistent A-fib: Maintaining sinus rhythm.  She remains on Lopressor 25 mg twice daily.  CHA2DS2-VASc at least 5.  She remains on Xarelto 20 mg daily..  Creatinine clearance has improved to 54.6 with noted uptrending weight.  No symptoms concerning for bleeding.  Recent renal function, electrolytes, and blood count stable as outlined above.  Moderate pericardial effusion: Stable on recent echo with no evidence of cardiac tamponade physiology.  She remains on furosemide as outlined  above.  Aortic insufficiency/mitral regurgitation: Monitor with periodic echo.  HTN: Blood pressure is well controlled in the office today.  Historically, blood pressure has been running on the low side with hypotension noted at home.  In this setting, we will decrease losartan to 12.5 mg daily.  She will remain on current dose of furosemide and Lopressor.  Mildly dilated ascending aorta: Most recent echo documented normal size and structure aorta.  Weight loss: Her weight loss has stabilized since she was last seen with a weight that is up 3 pounds since her visit in 10/2021.   Disposition: F/u with Dr. Saunders Revel or an APP at the end of July.   Medication Adjustments/Labs and Tests Ordered: Current medicines are  reviewed at length with the patient today.  Concerns regarding medicines are outlined above. Medication changes, Labs and Tests ordered today are summarized above and listed in the Patient Instructions accessible in Encounters.   Signed, Christell Faith, PA-C 11/15/2021 11:32 AM     [facility] [address] [place] [address] [place], [place] [address] [phone]

## 2021-11-15 ENCOUNTER — Ambulatory Visit (INDEPENDENT_AMBULATORY_CARE_PROVIDER_SITE_OTHER): Payer: Medicare Other | Admitting: Physician Assistant

## 2021-11-15 ENCOUNTER — Encounter: Payer: Self-pay | Admitting: Physician Assistant

## 2021-11-15 VITALS — BP 126/70 | HR 60 | Ht 67.0 in | Wt 141.2 lb

## 2021-11-15 DIAGNOSIS — I351 Nonrheumatic aortic (valve) insufficiency: Secondary | ICD-10-CM

## 2021-11-15 DIAGNOSIS — I340 Nonrheumatic mitral (valve) insufficiency: Secondary | ICD-10-CM | POA: Diagnosis not present

## 2021-11-15 DIAGNOSIS — I428 Other cardiomyopathies: Secondary | ICD-10-CM

## 2021-11-15 DIAGNOSIS — I10 Essential (primary) hypertension: Secondary | ICD-10-CM

## 2021-11-15 DIAGNOSIS — I3139 Other pericardial effusion (noninflammatory): Secondary | ICD-10-CM

## 2021-11-15 DIAGNOSIS — I77810 Thoracic aortic ectasia: Secondary | ICD-10-CM | POA: Diagnosis not present

## 2021-11-15 DIAGNOSIS — R634 Abnormal weight loss: Secondary | ICD-10-CM

## 2021-11-15 DIAGNOSIS — I4819 Other persistent atrial fibrillation: Secondary | ICD-10-CM | POA: Diagnosis not present

## 2021-11-15 DIAGNOSIS — I5032 Chronic diastolic (congestive) heart failure: Secondary | ICD-10-CM | POA: Diagnosis not present

## 2021-11-15 MED ORDER — LOSARTAN POTASSIUM 25 MG PO TABS: 12.5000 mg | ORAL_TABLET | Freq: Every day | ORAL | 3 refills | Status: DC

## 2021-11-15 MED ORDER — RIVAROXABAN 20 MG PO TABS: 20.0000 mg | ORAL_TABLET | Freq: Every day | ORAL | 3 refills | Status: DC

## 2021-11-15 NOTE — Patient Instructions (Signed)
Medication Instructions:  Your physician has recommended you make the following change in your medication:   DECREASE Losartan to 12.5 mg once daily  *If you need a refill on your cardiac medications before your next appointment, please call your pharmacy*   Lab Work: None  If you have labs (blood work) drawn today and your tests are completely normal, you will receive your results only by: [place] (if you have MyChart) OR A paper copy in the mail If you have any lab test that is abnormal or we need to change your treatment, we will call you to review the results.   Testing/Procedures: None   Follow-Up: At [facility], you and your health needs are our priority.  As part of our continuing mission to provide you with exceptional heart care, we have created designated Provider Care Teams.  These Care Teams include your primary Cardiologist (physician) and Advanced Practice Providers (APPs -  Physician Assistants and Nurse Practitioners) who all work together to provide you with the care you need, when you need it.   Your next appointment:   End of July  The format for your next appointment:   In Person  Provider:   Christell Faith, PA-C      Important Information About Sugar

## 2021-11-15 NOTE — Progress Notes (Signed)
Spoke with pharmacist at [facility] and requested they remove her medications from their system because she uses different pharmacy. They confirmed and read back medications, removal, and removed from automatic refill system.

## 2021-11-27 DIAGNOSIS — M9905 Segmental and somatic dysfunction of pelvic region: Secondary | ICD-10-CM | POA: Diagnosis not present

## 2021-11-27 DIAGNOSIS — M5136 Other intervertebral disc degeneration, lumbar region: Secondary | ICD-10-CM | POA: Diagnosis not present

## 2021-11-27 DIAGNOSIS — M955 Acquired deformity of pelvis: Secondary | ICD-10-CM | POA: Diagnosis not present

## 2021-11-27 DIAGNOSIS — M9903 Segmental and somatic dysfunction of lumbar region: Secondary | ICD-10-CM | POA: Diagnosis not present

## 2021-11-30 DIAGNOSIS — M48062 Spinal stenosis, lumbar region with neurogenic claudication: Secondary | ICD-10-CM | POA: Diagnosis not present

## 2021-11-30 DIAGNOSIS — M5136 Other intervertebral disc degeneration, lumbar region: Secondary | ICD-10-CM | POA: Diagnosis not present

## 2021-11-30 DIAGNOSIS — M5416 Radiculopathy, lumbar region: Secondary | ICD-10-CM | POA: Diagnosis not present

## 2021-12-05 ENCOUNTER — Encounter: Payer: Self-pay | Admitting: Internal Medicine

## 2021-12-05 ENCOUNTER — Telehealth: Payer: Self-pay | Admitting: Internal Medicine

## 2021-12-05 ENCOUNTER — Ambulatory Visit (INDEPENDENT_AMBULATORY_CARE_PROVIDER_SITE_OTHER): Payer: Medicare Other | Admitting: Internal Medicine

## 2021-12-05 VITALS — BP 118/62 | HR 60 | Temp 97.2°F | Resp 18 | Ht 67.0 in | Wt 139.6 lb

## 2021-12-05 DIAGNOSIS — E7800 Pure hypercholesterolemia, unspecified: Secondary | ICD-10-CM

## 2021-12-05 DIAGNOSIS — D696 Thrombocytopenia, unspecified: Secondary | ICD-10-CM

## 2021-12-05 DIAGNOSIS — I77810 Thoracic aortic ectasia: Secondary | ICD-10-CM

## 2021-12-05 DIAGNOSIS — I4891 Unspecified atrial fibrillation: Secondary | ICD-10-CM | POA: Diagnosis not present

## 2021-12-05 DIAGNOSIS — M5441 Lumbago with sciatica, right side: Secondary | ICD-10-CM | POA: Diagnosis not present

## 2021-12-05 DIAGNOSIS — I5030 Unspecified diastolic (congestive) heart failure: Secondary | ICD-10-CM | POA: Diagnosis not present

## 2021-12-05 LAB — HEPATIC FUNCTION PANEL
ALT: 32 U/L (ref 0–35)
AST: 27 U/L (ref 0–37)
Albumin: 3.9 g/dL (ref 3.5–5.2)
Alkaline Phosphatase: 91 U/L (ref 39–117)
Bilirubin, Direct: 0.2 mg/dL (ref 0.0–0.3)
Total Bilirubin: 0.8 mg/dL (ref 0.2–1.2)
Total Protein: 6 g/dL (ref 6.0–8.3)

## 2021-12-05 LAB — LIPID PANEL
Cholesterol: 200 mg/dL (ref 0–200)
HDL: 66.6 mg/dL (ref >39.00)
LDL Cholesterol: 113 mg/dL — ABNORMAL HIGH (ref 0–99)
NonHDL: 133.56
Total CHOL/HDL Ratio: 3
Triglycerides: 104 mg/dL (ref 0.0–149.0)
VLDL: 20.8 mg/dL (ref 0.0–40.0)

## 2021-12-05 LAB — BASIC METABOLIC PANEL
BUN: 19 mg/dL (ref 6–23)
CO2: 30 mEq/L (ref 19–32)
Calcium: 9.5 mg/dL (ref 8.4–10.5)
Chloride: 103 mEq/L (ref 96–112)
Creatinine, Ser: 0.66 mg/dL (ref 0.40–1.20)
GFR: 79.74 mL/min (ref >60.00)
Glucose, Bld: 86 mg/dL (ref 70–99)
Potassium: 4.8 mEq/L (ref 3.5–5.1)
Sodium: 140 mEq/L (ref 135–145)

## 2021-12-05 NOTE — Progress Notes (Unsigned)
Patient ID: Deborah Baxter, female   DOB: [birth year], 88 y.o.   MRN: [id number]   Subjective:    Patient ID: Deborah Baxter, female    DOB: [birth year], 88 y.o.   MRN: [id number]   Patient here for a scheduled follow up.   Chief Complaint  Patient presents with   Follow-up    5 week follow up   .   HPI Here to follow up regarding her blood pressure, afib, cholesterol and HFpEF.  She is accompanied by her daughter.  History obtained from both of them.  She is doing better.  Feels better breathing better.  Weighing daily. Weight is stable.  No chest pain.  No increased cough or congestion.  No abdominal pain.  Eating.  No vomiting.  Bowels moving with miralax q 3 days.  Saw Whitney Meeler - f/u - 11/30/21.  Previous injection helped some.  Planning f/u ESI.  Recommended continuing tylenol.  Daughter brought up trying - trial of gabapentin to see if would help with pain.  Saw cardiology.  Losartan adjusted - 12.'5mg'$  q day now.  Blood pressures reviewed - outside readings attached.  Planning cardiac MRI 7/12. S/p right heart cath.     Past Medical History:  Diagnosis Date   (HFpEF) heart failure with preserved ejection fraction ([place]) 04/24/2021   Pulmonary raised concern - question of pulmonary hypertension secondary to DD.  Question of need for right heart cath   A-fib [facility]) 04/24/2021   Acute heart failure with preserved ejection fraction (HCC)    Ascending aorta dilatation ([place]) 08/01/2021   Atrial fibrillation with rapid ventricular response ([place]) 03/20/2021   Congestion of nasal sinus 10/13/2021   Cough 04/23/2021   Diverticulosis 09/05/2012   Colonoscopy 08/31/12 - redundant colon and diverticulosis in the sigmoid colon, descending colon, transverse colon and hepatic flexure.  Recommend f/u colonoscopy in 10 years.    Elevated bilirubin 09/13/2021   Elevated blood pressure reading 12/05/2020   Health care maintenance 07/13/2014   Physical 07/12/14.  Colonoscopy 08/31/12.  Normal bone density 04/06/10.   Mammogram 05/30/20- Birads I.     Hx of degenerative disc disease    Hypercholesterolemia    Hypertensive urgency 03/20/2021   Ingrown toenail 09/13/2021   Left hand pain 03/20/2021   Low back pain 01/29/2021   Lumbago 07/13/2014   Lumbar spinal stenosis 06/27/2012   Neuromuscular disorder (HCC)    pinched nerve   Numbness and tingling in left hand 08/21/2017   Osteoarthritis    Pleural effusion 09/13/2021   Pulmonary edema 03/20/2021   Seasonal allergies    Spinal stenosis    chronic back and leg pain   Stress 02/03/2021   Syncope 12/05/2020   Thrombocytopenia ([place]) 09/13/2021   Past Surgical History:  Procedure Laterality Date   APPENDECTOMY     bladder tack surgery  2000   BTL  1976   CARDIOVERSION N/A 05/11/2021   Procedure: CARDIOVERSION;  Surgeon: Minna Merritts, MD;  Location: [facility] ORS;  Service: Cardiovascular;  Laterality: N/A;   CARPAL TUNNEL RELEASE     CATARACT EXTRACTION W/PHACO Left 12/07/2015   Procedure: CATARACT EXTRACTION PHACO AND INTRAOCULAR LENS PLACEMENT ([place]);  Surgeon: Eulogio Bear, MD;  Location: [facility] ORS;  Service: Ophthalmology;  Laterality: Left;  [place] 1.23 AP% 23.2 CDE 19.28 Fluid Pack Lot # [id number]   EYE SURGERY  03/19/15   GANGLION CYST EXCISION     right hand   GAS/FLUID EXCHANGE Left 03/08/2015   Procedure: GAS/FLUID  EXCHANGE;  Surgeon: Milus Height, MD;  Location: [facility] ORS;  Service: Ophthalmology;  Laterality: Left;   RIGHT HEART CATH N/A 11/06/2021   Procedure: RIGHT HEART CATH;  Surgeon: Nelva Bush, MD;  Location: [facility];  Service: Cardiovascular;  Laterality: N/A;   TONSILLECTOMY     TUBAL LIGATION     VITRECTOMY 25 GAUGE WITH SCLERAL BUCKLE Left 03/08/2015   Procedure: VITRECTOMY 25 GAUGE, membrane peel, 26 %  SF6 gas exchange;  Surgeon: Milus Height, MD;  Location: [facility] ORS;  Service: Ophthalmology;  Laterality: Left;  casette lot # [id number]   Family History  Problem Relation Age of Onset   Diabetes Mother     Heart disease Father        Unknown age of onset. Unknown if had CHF or heart attack.   Leukemia Father    Breast cancer Sister        in her 55's   Cancer Sister        lung cancer   Thyroid cancer Sister    Breast cancer Maternal Aunt    Colon cancer Neg Hx    Social History   Socioeconomic History   Marital status: Widowed    Spouse name: Not on file   Number of children: 5   Years of education: Not on file   Highest education level: Not on file  Occupational History   Not on file  Tobacco Use   Smoking status: Former    Packs/day: 0.25    Years: 10.00    Total pack years: 2.50    Types: Cigarettes    Quit date: 61    Years since quitting: 50.5   Smokeless tobacco: Never  Vaping Use   Vaping Use: Never used  Substance and Sexual Activity   Alcohol use: Yes    Alcohol/week: 0.0 standard drinks of alcohol    Comment: occassional   Drug use: No   Sexual activity: Not on file  Other Topics Concern   Not on file  Social History Narrative   Not on file   Social Determinants of Health   Financial Resource Strain: Low Risk  (08/20/2021)   Overall Financial Resource Strain (CARDIA)    Difficulty of Paying Living Expenses: Not hard at all  Food Insecurity: No Food Insecurity (08/20/2021)   Hunger Vital Sign    Worried About Running Out of Food in the Last Year: Never true    Ran Out of Food in the Last Year: Never true  Transportation Needs: No Transportation Needs (08/20/2021)   PRAPARE - Hydrologist (Medical): No    Lack of Transportation (Non-Medical): No  Physical Activity: Insufficiently Active (08/17/2020)   Exercise Vital Sign    Days of Exercise per Week: 3 days    Minutes of Exercise per Session: 20 min  Stress: No Stress Concern Present (08/20/2021)   [place]    Feeling of Stress : Not at all  Social Connections: Unknown (08/20/2021)   Social Connection and  Isolation Panel [NHANES]    Frequency of Communication with Friends and Family: More than three times a week    Frequency of Social Gatherings with Friends and Family: More than three times a week    Attends Religious Services: More than 4 times per year    Active Member of [employer] or Organizations: Not on file    Attends Archivist Meetings: Not on file  Marital Status: Widowed     Review of Systems  Constitutional:  Negative for appetite change and unexpected weight change.  HENT:  Negative for congestion and sinus pressure.   Respiratory:  Negative for cough and chest tightness.        Breathing improved.   Cardiovascular:  Negative for chest pain and palpitations.  Gastrointestinal:  Negative for abdominal pain, diarrhea, nausea and vomiting.  Genitourinary:  Negative for difficulty urinating and dysuria.  Musculoskeletal:  Negative for joint swelling and myalgias.  Skin:  Negative for color change and rash.  Neurological:  Negative for dizziness, light-headedness and headaches.  Psychiatric/Behavioral:  Negative for agitation and dysphoric mood.        Objective:     BP 118/62 (BP Location: Left Arm, Patient Position: Sitting, Cuff Size: Normal)   Pulse 60   Temp (!) 97.2 F (36.2 C) (Oral)   Resp 18   Ht '5\' 7"'$  (1.702 m)   Wt 139 lb 9.6 oz (63.3 kg)   LMP 06/26/1985   SpO2 95%   BMI 21.86 kg/m  Wt Readings from Last 3 Encounters:  12/05/21 139 lb 9.6 oz (63.3 kg)  11/15/21 141 lb 3.2 oz (64 kg)  11/06/21 140 lb (63.5 kg)    Physical Exam Vitals reviewed.  Constitutional:      General: She is not in acute distress.    Appearance: Normal appearance.  HENT:     Head: Normocephalic and atraumatic.     Right Ear: External ear normal.     Left Ear: External ear normal.  Eyes:     General: No scleral icterus.       Right eye: No discharge.        Left eye: No discharge.     Conjunctiva/sclera: Conjunctivae normal.  Neck:     Thyroid: No thyromegaly.   Cardiovascular:     Rate and Rhythm: Normal rate.     Comments: Rate controlled.  Pulmonary:     Effort: No respiratory distress.     Breath sounds: Normal breath sounds. No wheezing.  Abdominal:     General: Bowel sounds are normal.     Palpations: Abdomen is soft.     Tenderness: There is no abdominal tenderness.  Musculoskeletal:        General: No swelling or tenderness.     Cervical back: Neck supple. No tenderness.  Lymphadenopathy:     Cervical: No cervical adenopathy.  Skin:    Findings: No erythema or rash.  Neurological:     Mental Status: She is alert.  Psychiatric:        Mood and Affect: Mood normal.        Behavior: Behavior normal.      Outpatient Encounter Medications as of 12/05/2021  Medication Sig   acetaminophen (TYLENOL) 650 MG CR tablet Take 650 mg by mouth every 8 (eight) hours as needed for pain. Reported on 08/02/2015   furosemide (LASIX) 40 MG tablet TAKE 1 TO 2 TABLETS BY MOUTH EVERY DAY AS DIRECTED   losartan (COZAAR) 25 MG tablet Take 0.5 tablets (12.5 mg total) by mouth daily.   metoprolol tartrate (LOPRESSOR) 25 MG tablet Take 1 tablet (25 mg total) by mouth 2 (two) times daily.   Multiple Vitamin (MULTIVITAMIN) tablet Take 1 tablet by mouth daily.   OVER THE COUNTER MEDICATION Place 1 drop into both eyes every 8 (eight) hours as needed (dry eyes.). Lubricant eye drops   Polyethylene Glycol 3350 (MIRALAX PO) Take 17  g by mouth every 3 (three) days. Every 3 days   potassium chloride SA (KLOR-CON M) 20 MEQ tablet Take 20 mEq by mouth every other day.   rivaroxaban (XARELTO) 20 MG TABS tablet Take 1 tablet (20 mg total) by mouth daily with supper.   Facility-Administered Encounter Medications as of 12/05/2021  Medication   sodium chloride flush (NS) 0.9 % injection 3 mL     Lab Results  Component Value Date   WBC 7.4 10/24/2021   HGB 13.9 10/24/2021   HCT 42.9 10/24/2021   PLT 160 10/24/2021   GLUCOSE 86 12/05/2021   CHOL 200 12/05/2021    TRIG 104.0 12/05/2021   HDL 66.60 12/05/2021   LDLDIRECT 104.0 08/12/2017   LDLCALC 113 (H) 12/05/2021   ALT 32 12/05/2021   AST 27 12/05/2021   NA 140 12/05/2021   K 4.8 12/05/2021   CL 103 12/05/2021   CREATININE 0.66 12/05/2021   BUN 19 12/05/2021   CO2 30 12/05/2021   TSH 2.75 07/31/2021   INR 1.2 03/20/2021    CARDIAC CATHETERIZATION  Result Date: 11/06/2021 Conclusions: Normal left and right heart filling pressures (mean RAP 5 mmHg, PCWP 15 mmHg). Borderline pulmonary hypertension (mean PAP 21 mmHg). Normal Fick cardiac output/index. Recommendations: Continue medical therapy to maintain net even fluid balance. Nelva Bush, MD [facility]      Assessment & Plan:   Problem List Items Addressed This Visit     (HFpEF) heart failure with preserved ejection fraction ([place]) - Primary    Breathing overall improved.  On lasix daily. Not taking potassium.   No evidence of volume overload today on exam. Recently had losartan dose decreased to 12.'5mg'$  q day.  Follow.  Check metabolic panel.       Relevant Orders   Basic metabolic panel (Completed)   A-fib ([place])    Continues on metoprolol and xarelto.  Stable. Follow.       Ascending aorta dilatation (HCC)     Continue adequate blood pressure control.  Follow.       Hypercholesterolemia    Low cholesterol diet and exercise.  Have discussed calculated cholesterol risk and recommendation to start cholesterol medication.  She declines.  Follow lipid panel.        Relevant Orders   Hepatic function panel (Completed)   Lipid panel (Completed)   Low back pain    Persistent back/leg pain.  Is some better after injection.  Planning f/u ESI.  Seeing physiatry.  Daughter was questioning a trial of gabapentin.  Discuss with physiatry given they are following for pain.  Discussed starting low dose gabapentin '100mg'$  q hs.  Discussed possible side effects.       Thrombocytopenia ([place])    Abdominal ultrasound revealed spleen size -  wnl.  hgb wnl.  Follow cbc.         Einar Pheasant, MD

## 2021-12-05 NOTE — Telephone Encounter (Signed)
Left message for Deborah Baxter (Physiatry) [facility] - about starting gabapentin.  Left message for her to call me.

## 2021-12-06 ENCOUNTER — Encounter (INDEPENDENT_AMBULATORY_CARE_PROVIDER_SITE_OTHER): Payer: Self-pay | Admitting: Nurse Practitioner

## 2021-12-06 ENCOUNTER — Other Ambulatory Visit (INDEPENDENT_AMBULATORY_CARE_PROVIDER_SITE_OTHER): Payer: Self-pay | Admitting: Vascular Surgery

## 2021-12-06 ENCOUNTER — Ambulatory Visit (INDEPENDENT_AMBULATORY_CARE_PROVIDER_SITE_OTHER): Payer: Medicare Other | Admitting: Nurse Practitioner

## 2021-12-06 ENCOUNTER — Other Ambulatory Visit: Payer: Self-pay

## 2021-12-06 ENCOUNTER — Encounter: Payer: Self-pay | Admitting: Internal Medicine

## 2021-12-06 ENCOUNTER — Ambulatory Visit (INDEPENDENT_AMBULATORY_CARE_PROVIDER_SITE_OTHER): Payer: Medicare Other

## 2021-12-06 VITALS — BP 82/42 | HR 73 | Resp 16 | Ht 67.5 in | Wt 140.0 lb

## 2021-12-06 DIAGNOSIS — E7800 Pure hypercholesterolemia, unspecified: Secondary | ICD-10-CM | POA: Diagnosis not present

## 2021-12-06 DIAGNOSIS — L819 Disorder of pigmentation, unspecified: Secondary | ICD-10-CM

## 2021-12-06 DIAGNOSIS — Z1231 Encounter for screening mammogram for malignant neoplasm of breast: Secondary | ICD-10-CM

## 2021-12-06 NOTE — Assessment & Plan Note (Signed)
Persistent back/leg pain.  Is some better after injection.  Planning f/u ESI.  Seeing physiatry.  Daughter was questioning a trial of gabapentin.  Discuss with physiatry given they are following for pain.  Discussed starting low dose gabapentin '100mg'$  q hs.  Discussed possible side effects.

## 2021-12-06 NOTE — Assessment & Plan Note (Signed)
Abdominal ultrasound revealed spleen size - wnl.  hgb wnl.  Follow cbc.  

## 2021-12-06 NOTE — Assessment & Plan Note (Signed)
Continues on metoprolol and xarelto.  Stable. Follow.  

## 2021-12-06 NOTE — Assessment & Plan Note (Signed)
Breathing overall improved.  On lasix daily. Not taking potassium.   No evidence of volume overload today on exam. Recently had losartan dose decreased to 12.'5mg'$  q day.  Follow.  Check metabolic panel.

## 2021-12-06 NOTE — Assessment & Plan Note (Signed)
Continue adequate blood pressure control.  Follow.  

## 2021-12-06 NOTE — Assessment & Plan Note (Signed)
Low cholesterol diet and exercise.  Have discussed calculated cholesterol risk and recommendation to start cholesterol medication.  She declines.  Follow lipid panel.   

## 2021-12-11 ENCOUNTER — Telehealth (HOSPITAL_COMMUNITY): Payer: Self-pay | Admitting: *Deleted

## 2021-12-11 NOTE — Telephone Encounter (Signed)
Reaching out to patient to offer assistance regarding upcoming cardiac imaging study; pt verbalizes understanding of appt date/time, parking situation and where to check in,  verified current allergies; name and call back number provided for further questions should they arise  Gordy Clement RN [facility] [phone] office [phone] cell  Patient denies claustrophobia or metal.

## 2021-12-12 ENCOUNTER — Ambulatory Visit
Admission: RE | Admit: 2021-12-12 | Discharge: 2021-12-12 | Disposition: A | Discharge status: Home / Self Care | Payer: Medicare Other | Source: Ambulatory Visit | Attending: Physician Assistant | Admitting: Physician Assistant

## 2021-12-12 DIAGNOSIS — I428 Other cardiomyopathies: Secondary | ICD-10-CM | POA: Insufficient documentation

## 2021-12-12 MED ORDER — GADOBUTROL 1 MMOL/ML IV SOLN
10.0000 mL | Freq: Once | INTRAVENOUS | Status: AC | PRN
  Administered 2021-12-12: 10 mL via INTRAVENOUS

## 2021-12-18 DIAGNOSIS — M5416 Radiculopathy, lumbar region: Secondary | ICD-10-CM | POA: Diagnosis not present

## 2021-12-18 DIAGNOSIS — M48062 Spinal stenosis, lumbar region with neurogenic claudication: Secondary | ICD-10-CM | POA: Diagnosis not present

## 2021-12-23 ENCOUNTER — Encounter (INDEPENDENT_AMBULATORY_CARE_PROVIDER_SITE_OTHER): Payer: Self-pay | Admitting: Nurse Practitioner

## 2021-12-23 NOTE — Progress Notes (Signed)
Subjective:    Patient ID: Deborah Baxter, female    DOB: 22-Jun-1933, 88 y.o.   MRN: [id number] Chief Complaint  Patient presents with   Establish Care    Referred by Dr Ahmed Prima    Deborah Baxter is an 88 year old female who presents today from [facility] in regards to lower extremity discoloration.  The patient's right foot is discolored.  It was noted that she had 2+ palpable pulses with capillary refill less than 3 seconds.  She notes that this discoloration has been present for an extended time.  There is no pain.  She does have pain with ambulation however this is more so related to her degenerative disc issues.  There are also numerous varicosities present.  No rest pain.  Today noninvasive studies show an ABI of 1.33 on the right and 1.18 on the left.  Toe pressures are normal bilaterally.  The patient has strong triphasic tibial artery waveforms bilaterally with normal toe waveforms bilaterally.    Review of Systems  Skin:  Positive for color change.  All other systems reviewed and are negative.      Objective:   Physical Exam Vitals reviewed.  HENT:     Head: Normocephalic.  Cardiovascular:     Rate and Rhythm: Normal rate.     Pulses: Normal pulses.  Pulmonary:     Effort: Pulmonary effort is normal.  Skin:    General: Skin is warm and dry.     Capillary Refill: Capillary refill takes 2 to 3 seconds.  Neurological:     Mental Status: She is alert and oriented to person, place, and time.  Psychiatric:        Mood and Affect: Mood normal.        Behavior: Behavior normal.        Thought Content: Thought content normal.        Judgment: Judgment normal.     BP (!) 82/42 (BP Location: Right Arm)   Pulse 73   Resp 16   Ht 5' 7.5" (1.715 m)   Wt 140 lb (63.5 kg)   LMP 06/26/1985   BMI 21.60 kg/m   Past Medical History:  Diagnosis Date   (HFpEF) heart failure with preserved ejection fraction ([place]) 04/24/2021   Pulmonary raised concern - question of pulmonary  hypertension secondary to DD.  Question of need for right heart cath   A-fib [facility]) 04/24/2021   Acute heart failure with preserved ejection fraction (HCC)    Ascending aorta dilatation ([place]) 08/01/2021   Atrial fibrillation with rapid ventricular response ([place]) 03/20/2021   Congestion of nasal sinus 10/13/2021   Cough 04/23/2021   Diverticulosis 09/05/2012   Colonoscopy 08/31/12 - redundant colon and diverticulosis in the sigmoid colon, descending colon, transverse colon and hepatic flexure.  Recommend f/u colonoscopy in 10 years.    Elevated bilirubin 09/13/2021   Elevated blood pressure reading 12/05/2020   Health care maintenance 07/13/2014   Physical 07/12/14.  Colonoscopy 08/31/12.  Normal bone density 04/06/10.  Mammogram 05/30/20- Birads I.     Hx of degenerative disc disease    Hypercholesterolemia    Hypertensive urgency 03/20/2021   Ingrown toenail 09/13/2021   Left hand pain 03/20/2021   Low back pain 01/29/2021   Lumbago 07/13/2014   Lumbar spinal stenosis 06/27/2012   Neuromuscular disorder (HCC)    pinched nerve   Numbness and tingling in left hand 08/21/2017   Osteoarthritis    Pleural effusion 09/13/2021   Pulmonary edema 03/20/2021  Seasonal allergies    Spinal stenosis    chronic back and leg pain   Stress 02/03/2021   Syncope 12/05/2020   Thrombocytopenia ([place]) 09/13/2021    Social History   Socioeconomic History   Marital status: Widowed    Spouse name: Not on file   Number of children: 5   Years of education: Not on file   Highest education level: Not on file  Occupational History   Not on file  Tobacco Use   Smoking status: Former    Packs/day: 0.25    Years: 10.00    Total pack years: 2.50    Types: Cigarettes    Quit date: 59    Years since quitting: 50.5   Smokeless tobacco: Never  Vaping Use   Vaping Use: Never used  Substance and Sexual Activity   Alcohol use: Yes    Alcohol/week: 0.0 standard drinks of alcohol    Comment: occassional   Drug use: No    Sexual activity: Not on file  Other Topics Concern   Not on file  Social History Narrative   Not on file   Social Determinants of Health   Financial Resource Strain: Low Risk  (08/20/2021)   Overall Financial Resource Strain (CARDIA)    Difficulty of Paying Living Expenses: Not hard at all  Food Insecurity: No Food Insecurity (08/20/2021)   Hunger Vital Sign    Worried About Running Out of Food in the Last Year: Never true    Ran Out of Food in the Last Year: Never true  Transportation Needs: No Transportation Needs (08/20/2021)   PRAPARE - Hydrologist (Medical): No    Lack of Transportation (Non-Medical): No  Physical Activity: Insufficiently Active (08/17/2020)   Exercise Vital Sign    Days of Exercise per Week: 3 days    Minutes of Exercise per Session: 20 min  Stress: No Stress Concern Present (08/20/2021)   [place]    Feeling of Stress : Not at all  Social Connections: Unknown (08/20/2021)   Social Connection and Isolation Panel [NHANES]    Frequency of Communication with Friends and Family: More than three times a week    Frequency of Social Gatherings with Friends and Family: More than three times a week    Attends Religious Services: More than 4 times per year    Active Member of [employer] or Organizations: Not on file    Attends Archivist Meetings: Not on file    Marital Status: Widowed  Intimate Partner Violence: Not At Risk (08/20/2021)   Humiliation, Afraid, Rape, and Kick questionnaire    Fear of Current or Ex-Partner: No    Emotionally Abused: No    Physically Abused: No    Sexually Abused: No    Past Surgical History:  Procedure Laterality Date   APPENDECTOMY     bladder tack surgery  2000   BTL  1976   CARDIOVERSION N/A 05/11/2021   Procedure: CARDIOVERSION;  Surgeon: Minna Merritts, MD;  Location: [facility] ORS;  Service: Cardiovascular;  Laterality: N/A;    CARPAL TUNNEL RELEASE     CATARACT EXTRACTION W/PHACO Left 12/07/2015   Procedure: CATARACT EXTRACTION PHACO AND INTRAOCULAR LENS PLACEMENT ([place]);  Surgeon: Eulogio Bear, MD;  Location: [facility] ORS;  Service: Ophthalmology;  Laterality: Left;  [place] 1.23 AP% 23.2 CDE 19.28 Fluid Pack Lot # [id number]   EYE SURGERY  03/19/15   GANGLION CYST EXCISION  right hand   GAS/FLUID EXCHANGE Left 03/08/2015   Procedure: GAS/FLUID EXCHANGE;  Surgeon: Milus Height, MD;  Location: [facility] ORS;  Service: Ophthalmology;  Laterality: Left;   RIGHT HEART CATH N/A 11/06/2021   Procedure: RIGHT HEART CATH;  Surgeon: Nelva Bush, MD;  Location: [facility];  Service: Cardiovascular;  Laterality: N/A;   TONSILLECTOMY     TUBAL LIGATION     VITRECTOMY 25 GAUGE WITH SCLERAL BUCKLE Left 03/08/2015   Procedure: VITRECTOMY 25 GAUGE, membrane peel, 26 %  SF6 gas exchange;  Surgeon: Milus Height, MD;  Location: [facility];  Service: Ophthalmology;  Laterality: Left;  casette lot # [id number]    Family History  Problem Relation Age of Onset   Diabetes Mother    Heart disease Father        Unknown age of onset. Unknown if had CHF or heart attack.   Leukemia Father    Breast cancer Sister        in her 32's   Cancer Sister        lung cancer   Thyroid cancer Sister    Breast cancer Maternal Aunt    Colon cancer Neg Hx     Allergies  Allergen Reactions   Quinolones Other (See Comments)    Avoid FQ with Ao dilation  *Pt is unaware of allergy        Latest Ref Rng & Units 10/24/2021   12:09 PM 10/02/2021    1:49 PM 09/03/2021    1:58 PM  CBC  WBC 4.0 - 10.5 K/uL 7.4  5.1  5.3   Hemoglobin 12.0 - 15.0 g/dL 13.9  14.2  14.0   Hematocrit 36.0 - 46.0 % 42.9  43.3  42.4   Platelets 150 - 400 K/uL 160  130.0  121.0       CMP     Component Value Date/Time   NA 140 12/05/2021 1046   NA 145 (H) 05/18/2021 1442   K 4.8 12/05/2021 1046   CL 103 12/05/2021 1046   CO2 30 12/05/2021 1046   GLUCOSE  86 12/05/2021 1046   BUN 19 12/05/2021 1046   BUN 14 05/18/2021 1442   CREATININE 0.66 12/05/2021 1046   CREATININE 0.75 08/10/2021 1700   CALCIUM 9.5 12/05/2021 1046   PROT 6.0 12/05/2021 1046   ALBUMIN 3.9 12/05/2021 1046   AST 27 12/05/2021 1046   ALT 32 12/05/2021 1046   ALKPHOS 91 12/05/2021 1046   BILITOT 0.8 12/05/2021 1046   GFRNONAA >60 10/24/2021 1209     VAS [place] ABI WITH/WO TBI  Result Date: 12/10/2021  LOWER EXTREMITY DOPPLER STUDY Patient Name:  BEDELIA PONG  Date of Exam:   12/06/2021 Medical Rec #: [id number]     Accession #:    [id number] Date of Birth: 06-11-1933     Patient Gender: F Patient Age:   24 years Exam Location:  [facility] Procedure:      VAS [place] ABI WITH/WO TBI Referring Phys: GREGORY SCHNIER --------------------------------------------------------------------------------  Indications: Peripheral artery disease.  Performing Technologist: Blondell Reveal RT, RDMS, RVT  Examination Guidelines: A complete evaluation includes at minimum, Doppler waveform signals and systolic blood pressure reading at the level of bilateral brachial, anterior tibial, and posterior tibial arteries, when vessel segments are accessible. Bilateral testing is considered an integral part of a complete examination. Photoelectric Plethysmograph (PPG) waveforms and toe systolic pressure readings are included as required and additional duplex testing as needed. Limited examinations for reoccurring  indications may be performed as noted.  ABI Findings: +---------+------------------+-----+---------+--------+ Right    Rt Pressure (mmHg)IndexWaveform Comment  +---------+------------------+-----+---------+--------+ Brachial 106                                      +---------+------------------+-----+---------+--------+ ATA      141               1.33 triphasic         +---------+------------------+-----+---------+--------+ PTA      117               1.10 triphasic          +---------+------------------+-----+---------+--------+ Great Toe75                0.71 Normal            +---------+------------------+-----+---------+--------+ +---------+------------------+-----+---------+-------+ Left     Lt Pressure (mmHg)IndexWaveform Comment +---------+------------------+-----+---------+-------+ Brachial 97                                      +---------+------------------+-----+---------+-------+ ATA      125               1.18 triphasic        +---------+------------------+-----+---------+-------+ PTA      119               1.12 triphasic        +---------+------------------+-----+---------+-------+ Great Toe79                0.75 Normal           +---------+------------------+-----+---------+-------+  Summary: Bilateral: Bilateral ankle-brachial indexes are within normal range. No evidence of significant lower extremity arterial disease. Bilateral toe-brachial indexes are within normal range.  *See table(s) above for measurements and observations.  Electronically signed by Hortencia Pilar MD on 12/10/2021 at 5:21:53 PM.    Final        Assessment & Plan:   1. Discoloration of skin Based on noninvasive studies today the patient has no evidence of acute ischemia.  I suspect that the discoloration is more so a result of venous insufficiency.  Discussed with patient conservative methods including use of medical grade compression, elevation and activity.  The patient also has heart failure which may play a component in the discoloration as well.  No role for acute intervention. - VAS [place] ABI WITH/WO TBI  2. Hypercholesterolemia Continue management as directed by PCP   Current Outpatient Medications on File Prior to Visit  Medication Sig Dispense Refill   acetaminophen (TYLENOL) 650 MG CR tablet Take 650 mg by mouth every 8 (eight) hours as needed for pain. Reported on 08/02/2015     furosemide (LASIX) 40 MG tablet TAKE 1 TO 2 TABLETS BY MOUTH  EVERY DAY AS DIRECTED 180 tablet 0   losartan (COZAAR) 25 MG tablet Take 0.5 tablets (12.5 mg total) by mouth daily. 45 tablet 3   metoprolol tartrate (LOPRESSOR) 25 MG tablet Take 1 tablet (25 mg total) by mouth 2 (two) times daily. 180 tablet 3   Multiple Vitamin (MULTIVITAMIN) tablet Take 1 tablet by mouth daily.     OVER THE COUNTER MEDICATION Place 1 drop into both eyes every 8 (eight) hours as needed (dry eyes.). Lubricant eye drops     Polyethylene Glycol 3350 (MIRALAX PO) Take 17 g by mouth  every 3 (three) days. Every 3 days     rivaroxaban (XARELTO) 20 MG TABS tablet Take 1 tablet (20 mg total) by mouth daily with supper. 90 tablet 3   potassium chloride SA (KLOR-CON M) 20 MEQ tablet Take 20 mEq by mouth every other day. (Patient not taking: Reported on 12/06/2021)     Current Facility-Administered Medications on File Prior to Visit  Medication Dose Route Frequency Provider Last Rate Last Admin   sodium chloride flush (NS) 0.9 % injection 3 mL  3 mL Intravenous Q12H Christell Faith M, PA-C        There are no Patient Instructions on file for this visit. No follow-ups on file.   Kris Hartmann, NP

## 2021-12-24 NOTE — Progress Notes (Unsigned)
Cardiology Office Note    Date:  12/25/2021   ID:  Deborah Baxter, DOB 1933-10-30, MRN [id number]  PCP:  Einar Pheasant, MD  Cardiologist:  Nelva Bush, MD  Electrophysiologist:  None   Chief Complaint: Follow up  History of Present Illness:   Deborah Baxter is a 88 y.o. 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   Prior ED evaluation for syncope in 11/2020 with negative orthostatics and largely unrevealing labs.  Cardiac enzymes not cycled.  EKG unavailable for review.   She was admitted to [facility] fin 01/2021 with acute HFpEF, A. fib with RVR of uncertain chronicity (initially noted to be in A-fib in 03/2021), and moderate pericardial effusion.  EKG demonstrated A. fib with RVR, 121 bpm, low voltage QRS, and nonspecific ST-T changes.  High-sensitivity troponin of 28 with a delta of 17.  BNP 335.  Echo demonstrated a low normal LV systolic function with an EF of 50 to 55%, no regional wall motion abnormalities, moderate LVH, LV diastolic function parameters could not be evaluated, mildly reduced RV systolic function with normal ventricular cavity size, normal PASP, moderate biatrial enlargement, moderate pericardial effusion that was posterior to the LV without evidence of cardiac tamponade, degenerative mitral valve with moderate regurgitation and moderate to severe mitral annular calcification, moderate tricuspid regurgitation, tricuspid aortic valve with trivial insufficiency and mild aortic valve sclerosis without evidence of stenosis, mildly dilated ascending aorta measuring 41 mm, and an estimated right atrial pressure of 8 mmHg.  Given moderate LVH, biatrial enlargement, and low voltage on EKG,  infiltrative cardiomyopathy was recommended to be considered.  She responded well to IV diuresis.  With regards to her A. fib, she was rate controlled with beta-blocker and initiated on Xarelto.   She was seen in hospital follow-up on 03/30/2021 and was doing reasonably well without tachypalpitations and a noted improvement in her energy.  She still noted some exertional shortness of breath.  She reported adherence to medications and was without symptoms of bleeding.  She remained in A. fib with a ventricular rate of 94 bpm on EKG.  She was felt to be still mildly volume up with recommendation to briefly increase furosemide.  Subsequent repeat limited echo on 05/02/2021 demonstrated an EF of 50%, moderate LVH, normal RV systolic function and ventricular cavity size, stable moderate pericardial effusion that was posterior and lateral to the left ventricle without evidence of cardiac tamponade, moderate pleural effusion in the left lateral region, mild mitral regurgitation, trivial aortic insufficiency, mildly dilated ascending aorta measuring 41 mm, and an estimated right atrial pressure of 8 mmHg.   She was evaluated by her PCP in late 04/2021 with URI-like symptoms.  Lasix was again briefly increased.  Chest x-ray showed chronic scarring within the left lung base and no new focal abnormality.   She was seen in follow-up on 05/04/2021 and reported her breathing was largely stable, though did continue to note exertional dyspnea.  Her URI symptoms were improved.  She was without chest pain or palpitations.  Her weights had trended from 163 to 169 pounds.  She was taking furosemide 40 mg daily.  She remained in rate controlled A. fib and had not missed any doses of anticoagulation.  No bleeding concerns.  Her Lasix was again briefly  titrated.  She underwent successful DCCV without complication on 63/01/9372 following 1 synchronized cardioversion at 150 J with postprocedure EKG demonstrating NSR.   She was seen in  the on 05/18/2021 and was doing well from a cardiac perspective.  She had redeveloped A-fib with controlled ventricular response.  She was asymptomatic regarding this.  Her weight was down 6 pounds by our scale.  She was without symptoms of angina or decompensation.  Rate control strategy was pursued given age and she was asymptomatic in A-fib.  With regards to her possible infiltrative cardiomyopathy, patient and family preferred to defer further evaluation of this.  Repeat limited echo on 05/31/2021 demonstrated an EF of 60 to 65%, no regional wall motion abnormalities, severe concentric LVH, indeterminate LV diastolic function parameters, normal RV systolic function and ventricular cavity size, mildly elevated PASP estimated at 38.6 mmHg, moderate biatrial enlargement, moderate pericardial effusion posterior to the LV without evidence of tamponade, mild to moderate mitral regurgitation, moderate mitral annular calcification, moderate tricuspid regurgitation, mild aortic insufficiency, aortic valve sclerosis without evidence of stenosis, mildly dilated ascending aorta measuring 40 mm, and a rhythm of A-fib/flutter.   She was evaluated by her PCP in late 07/2021 with an increase in shortness of breath.  Lasix was briefly titrated.  Chest x-ray showed chronic left base scarring and possible chronic small pleural effusion without new consolidation.  There was also diffuse chronic interstitial coarsening.  Overall, findings were stable when compared to prior plain film.   She underwent repeat limited echo on 08/17/2021 which showed an EF of 55 to 60%, no regional wall motion abnormalities, severe LVH, indeterminate LV diastolic function parameters, normal RV systolic function and ventricular cavity size, moderately elevated PASP estimated at 50.3 mmHg, moderate biatrial enlargement, mild to moderate mitral regurgitation, moderate tricuspid regurgitation, and estimated right atrial pressure of 15 mmHg, and a stable  moderate pericardial effusion without evidence of tamponade.  Given these findings, it was recommended she increase her furosemide to 40 mg twice daily.   She was seen in the office on 08/22/2021, noting stable intermittent shortness of breath when ambulating to the mailbox.  She was without symptoms of angina or decompensation.  Her weight was stable.  Furosemide was reduced back to 40 mg daily.   She was seen in the office in 10/2020 and was without symptoms of angina or decompensation.  Her weight was down from 164 pounds to 138 pounds when compared to her visit in 08/2021.  This weight loss was unintentional.  There was also some concern regarding lower extremity hyperpigmentation that improved with leg elevation with noted normal pulses bilaterally.  She was also noted to have spontaneously converted to sinus rhythm with metoprolol being decreased to 25 mg twice daily secondary to relative hypotension.   She was evaluated by pulmonology on 11/02/2021 with recommendations for overnight pulse oximetry and to move forward with RHC.   She underwent RHC on 11/06/2021 which demonstrated normal left and right heart filling pressures with a mean RA pressure of 5 mmHg and a PCWP of 15 mmHg.  Borderline pulmonary hypertension with a mean PA pressure of 21 mmHg.  Normal cardiac output and index.  Cardiac MRI is scheduled for 12/12/2021.  She was last seen in the office on 11/15/2021 and was without symptoms of angina or decompensation.  She noted no significant dyspnea.    Cardiac MRI on 12/12/2021 demonstrated an EF of 59%, global transmural LGE in a heterogeneous pattern of the LV myocardium, mild circumferential  pericardial effusion, moderate asymmetric LVH, and a severely dilated left atrium.  Findings were consistent with an infiltrative cardiomyopathy such as cardiac amyloidosis.  She comes in today accompanied by one of her daughters and is doing well from a cardiac perspective, without symptoms of angina or  decompensation.  Overall, she feels better today than she has at her past several visits.  No dyspnea, palpitations, dizziness, presyncope, or syncope.  No lower extremity swelling or orthopnea.  She continues to advance activity as tolerated.  No falls or symptoms concerning for bleeding.  Her weight remains stable.  Blood pressures at home have been on the soft side, though she is asymptomatic with this.  She does not have any acute concerns at this time.   Labs independently reviewed: 12/2021 - potassium 4.8, BUN 19, serum creatinine 0.66, TC 200, TG 104, HDL 66, LDL 113, albumin 3.9, AST/ALT normal 10/2021 - Hgb 13.9, PLT 160 07/2021 - TSH normal  Past Medical History:  Diagnosis Date   (HFpEF) heart failure with preserved ejection fraction ([place]) 04/24/2021   Pulmonary raised concern - question of pulmonary hypertension secondary to DD.  Question of need for right heart cath   A-fib [facility]) 04/24/2021   Acute heart failure with preserved ejection fraction (HCC)    Ascending aorta dilatation ([place]) 08/01/2021   Atrial fibrillation with rapid ventricular response ([place]) 03/20/2021   Congestion of nasal sinus 10/13/2021   Cough 04/23/2021   Diverticulosis 09/05/2012   Colonoscopy 08/31/12 - redundant colon and diverticulosis in the sigmoid colon, descending colon, transverse colon and hepatic flexure.  Recommend f/u colonoscopy in 10 years.    Elevated bilirubin 09/13/2021   Elevated blood pressure reading 12/05/2020   Health care maintenance 07/13/2014   Physical 07/12/14.  Colonoscopy 08/31/12.  Normal bone density 04/06/10.  Mammogram 05/30/20- Birads I.     Hx of degenerative disc disease    Hypercholesterolemia    Hypertensive urgency 03/20/2021   Ingrown toenail 09/13/2021   Left hand pain 03/20/2021   Low back pain 01/29/2021   Lumbago 07/13/2014   Lumbar spinal stenosis 06/27/2012   Neuromuscular disorder (HCC)    pinched nerve   Numbness and tingling in left hand 08/21/2017   Osteoarthritis     Pleural effusion 09/13/2021   Pulmonary edema 03/20/2021   Seasonal allergies    Spinal stenosis    chronic back and leg pain   Stress 02/03/2021   Syncope 12/05/2020   Thrombocytopenia ([place]) 09/13/2021    Past Surgical History:  Procedure Laterality Date   APPENDECTOMY     bladder tack surgery  2000   BTL  1976   CARDIOVERSION N/A 05/11/2021   Procedure: CARDIOVERSION;  Surgeon: Minna Merritts, MD;  Location: [facility] ORS;  Service: Cardiovascular;  Laterality: N/A;   CARPAL TUNNEL RELEASE     CATARACT EXTRACTION W/PHACO Left 12/07/2015   Procedure: CATARACT EXTRACTION PHACO AND INTRAOCULAR LENS PLACEMENT ([place]);  Surgeon: Eulogio Bear, MD;  Location: [facility] ORS;  Service: Ophthalmology;  Laterality: Left;  [place] 1.23 AP% 23.2 CDE 19.28 Fluid Pack Lot # [id number]   EYE SURGERY  03/19/15   GANGLION CYST EXCISION     right hand   GAS/FLUID EXCHANGE Left 03/08/2015   Procedure: GAS/FLUID EXCHANGE;  Surgeon: Milus Height, MD;  Location: [facility] ORS;  Service: Ophthalmology;  Laterality: Left;   RIGHT HEART CATH N/A 11/06/2021   Procedure: RIGHT HEART CATH;  Surgeon: Nelva Bush, MD;  Location: [place] CV LAB;  Service: Cardiovascular;  Laterality:  N/A;   TONSILLECTOMY     TUBAL LIGATION     VITRECTOMY 25 GAUGE WITH SCLERAL BUCKLE Left 03/08/2015   Procedure: VITRECTOMY 25 GAUGE, membrane peel, 26 %  SF6 gas exchange;  Surgeon: Milus Height, MD;  Location: [facility] ORS;  Service: Ophthalmology;  Laterality: Left;  casette lot # [id number]    Current Medications: Current Meds  Medication Sig   acetaminophen (TYLENOL) 650 MG CR tablet Take 650 mg by mouth every 8 (eight) hours as needed for pain. Reported on 08/02/2015   furosemide (LASIX) 40 MG tablet TAKE 1 TO 2 TABLETS BY MOUTH EVERY DAY AS DIRECTED   losartan (COZAAR) 25 MG tablet Take 0.5 tablets (12.5 mg total) by mouth daily.   metoprolol succinate (TOPROL XL) 25 MG 24 hr tablet Take 0.5 tablets (12.5 mg total) by mouth daily.    Multiple Vitamin (MULTIVITAMIN) tablet Take 1 tablet by mouth daily.   OVER THE COUNTER MEDICATION Place 1 drop into both eyes every 8 (eight) hours as needed (dry eyes.). Lubricant eye drops   Polyethylene Glycol 3350 (MIRALAX PO) Take 17 g by mouth every 3 (three) days. Every 3 days   potassium chloride SA (KLOR-CON M) 20 MEQ tablet Take 20 mEq by mouth. Takes occasionally   [DISCONTINUED] metoprolol tartrate (LOPRESSOR) 25 MG tablet Take 1 tablet (25 mg total) by mouth 2 (two) times daily.   [DISCONTINUED] rivaroxaban (XARELTO) 20 MG TABS tablet Take 1 tablet (20 mg total) by mouth daily with supper.   Current Facility-Administered Medications for the 12/25/21 encounter (Office Visit) with Rise Mu, PA-C  Medication   sodium chloride flush (NS) 0.9 % injection 3 mL    Allergies:   Quinolones   Social History   Socioeconomic History   Marital status: Widowed    Spouse name: Not on file   Number of children: 5   Years of education: Not on file   Highest education level: Not on file  Occupational History   Not on file  Tobacco Use   Smoking status: Former    Packs/day: 0.25    Years: 10.00    Total pack years: 2.50    Types: Cigarettes    Quit date: 43    Years since quitting: 50.5   Smokeless tobacco: Never  Vaping Use   Vaping Use: Never used  Substance and Sexual Activity   Alcohol use: Yes    Alcohol/week: 0.0 standard drinks of alcohol    Comment: occassional   Drug use: No   Sexual activity: Not on file  Other Topics Concern   Not on file  Social History Narrative   Not on file   Social Determinants of Health   Financial Resource Strain: Low Risk  (08/20/2021)   Overall Financial Resource Strain (CARDIA)    Difficulty of Paying Living Expenses: Not hard at all  Food Insecurity: No Food Insecurity (08/20/2021)   Hunger Vital Sign    Worried About Running Out of Food in the Last Year: Never true    Ran Out of Food in the Last Year: Never true   Transportation Needs: No Transportation Needs (08/20/2021)   PRAPARE - Hydrologist (Medical): No    Lack of Transportation (Non-Medical): No  Physical Activity: Insufficiently Active (08/17/2020)   Exercise Vital Sign    Days of Exercise per Week: 3 days    Minutes of Exercise per Session: 20 min  Stress: No Stress Concern Present (08/20/2021)   [employer] [place]    Feeling of Stress : Not at all  Social Connections: Unknown (08/20/2021)   Social Connection and Isolation Panel [NHANES]    Frequency of Communication with Friends and Family: More than three times a week    Frequency of Social Gatherings with Friends and Family: More than three times a week    Attends Religious Services: More than 4 times per year    Active Member of [employer] or Organizations: Not on file    Attends Archivist Meetings: Not on file    Marital Status: Widowed     Family History:  The patient's family history includes Breast cancer in her maternal aunt and sister; Cancer in her sister; Diabetes in her mother; Heart disease in her father; Leukemia in her father; Thyroid cancer in her sister. There is no history of Colon cancer.  ROS:   12-point review of systems is negative unless otherwise noted in HPI.   EKGs/Labs/Other Studies Reviewed:    Studies reviewed were summarized above. The additional studies were reviewed today:    2D echo 03/20/2021: 1. Left ventricular ejection fraction, by estimation, is 50 to 55%. The  left ventricle has low normal function. The left ventricle has no regional  wall motion abnormalities. There is moderate left ventricular hypertrophy.  Left ventricular diastolic  function could not be evaluated.   2. Right ventricular systolic function is mildly reduced. The right  ventricular size is normal. There is normal pulmonary artery systolic  pressure.   3. Left atrial size was  moderately dilated.   4. Right atrial size was moderately dilated.   5. Moderate pericardial effusion. The pericardial effusion is posterior  to the left ventricle. There is no evidence of cardiac tamponade.   6. The mitral valve is degenerative. Moderate mitral valve regurgitation.  Moderate to severe mitral annular calcification.   7. Tricuspid valve is mildly thickened. The tricuspid valve is abnormal.  Tricuspid valve regurgitation is moderate.   8. The aortic valve is tricuspid. There is mild thickening of the aortic  valve. Aortic valve regurgitation is trivial. Mild aortic valve sclerosis  is present, with no evidence of aortic valve stenosis.   9. There is mild dilatation of the ascending aorta, measuring 41 mm.  10. The inferior vena cava is normal in size with <50% respiratory  variability, suggesting right atrial pressure of 8 mmHg.   Conclusion(s)/Recommendation(s): Given moderate LVH, biatrial enlargement,  and low voltage on EKG, infiltrative cardiomyopathy, including  amyloidosis, should be considered.  __________   Limited echo 05/02/2021: 1. Left ventricular ejection fraction, by estimation, is 50%. There is  moderate left ventricular hypertrophy.   2. Right ventricular systolic function is normal. The right ventricular  size is normal.   3. Moderate pericardial effusion. The pericardial effusion is posterior  and lateral to the left ventricle. There is no evidence of cardiac  tamponade. Moderate pleural effusion in the left lateral region.   4. Mild mitral valve regurgitation.   5. The aortic valve is tricuspid. Aortic valve regurgitation is trivial.   6. Aortic dilatation noted. There is mild dilatation of the ascending  aorta, measuring 41 mm.   7. The inferior vena cava is normal in size with <50% respiratory  variability, suggesting right atrial pressure of 8 mmHg.  __________   Limited echo 05/31/2021: 1. Left ventricular ejection fraction, by estimation,  is 60 to 65%. The  left ventricle has normal function. The left ventricle  has no regional  wall motion abnormalities. There is severe concentric left ventricular  hypertrophy. Left ventricular diastolic   parameters are indeterminate.   2. Right ventricular systolic function is normal. The right ventricular  size is normal. There is mildly elevated pulmonary artery systolic  pressure. The estimated right ventricular systolic pressure is 75.6 mmHg.   3. Left atrial size was moderately dilated.   4. Right atrial size was moderately dilated.   5. Moderate pericardial effusion in the posterior LV region, estimated  1.69 -2.00 cm, otherwise mild circumferential effusion No tamponade.   6. The mitral valve is normal in structure. Mild to moderate mitral valve  regurgitation. No evidence of mitral stenosis. Moderate mitral annular  calcification.   7. Tricuspid valve regurgitation is moderate.   8. The aortic valve is normal in structure. Aortic valve regurgitation is  mild. Aortic valve sclerosis/calcification is present, without any  evidence of aortic stenosis.   9. The inferior vena cava is normal in size with greater than 50%  respiratory variability, suggesting right atrial pressure of 3 mmHg.  10. There is mild dilatation of the ascending aorta, measuring 40 mm.  11. Rhythm apppears to be atrial fib/flutter. __________   Limited echo 08/17/2021: 1. Left ventricular ejection fraction, by estimation, is 55 to 60%. The  left ventricle has normal function. The left ventricle has no regional  wall motion abnormalities. There is severe left ventricular hypertrophy.  Left ventricular diastolic parameters   are indeterminate.   2. Right ventricular systolic function is normal. The right ventricular  size is normal. There is moderately elevated pulmonary artery systolic  pressure. The estimated right ventricular systolic pressure is 43.3 mmHg.   3. Left atrial size was moderately dilated.    4. Right atrial size was moderately dilated.   5. The mitral valve is normal in structure. Mild to moderate mitral valve  regurgitation. No evidence of mitral stenosis.   6. Tricuspid valve regurgitation is moderate.   7. The aortic valve is tricuspid. Aortic valve regurgitation is not  visualized. No aortic stenosis is present.   8. The inferior vena cava is dilated in size with <50% respiratory  variability, suggesting right atrial pressure of 15 mmHg.   9. Moderate pericardial effusion , estimated 1.3 to 2.6 cm off the LV  free wall, mild to moderate off the RV free wall (1.6 cm). No tamponade  noted. __________  RHC 11/06/2021: Conclusions: Normal left and right heart filling pressures (mean RAP 5 mmHg, PCWP 15 mmHg). Borderline pulmonary hypertension (mean PAP 21 mmHg). Normal Fick cardiac output/index.   Recommendations: Continue medical therapy to maintain net even fluid balance. __________  Cardiac MRI 12/12/2021: IMPRESSION: 1. Normal left and right ventricular function.  LVEF 59%. 2. There is global transmural late gadolinium enhancement (LGE) in a heterogeneous pattern in the left ventricular myocardium. 3. There is mild circumferential pericardial effusion. 4. Moderate asymmetric left ventricular hypertrophy. 5. Severely dilated left atrium. 6. Findings consistent with infiltrative cardiomyopathy such as cardiac amyloidosis.   EKG:  EKG is ordered today.  The EKG ordered today demonstrates NSR, 71 bpm, poor R wave progression along the precordial leads, no acute ST-T changes  Recent Labs: 03/20/2021: B Natriuretic Peptide 335.6 07/31/2021: TSH 2.75 10/24/2021: Hemoglobin 13.9; Platelets 160 12/05/2021: ALT 32 12/25/2021: BUN 34; Creatinine, Ser 0.84; Potassium 3.9; Sodium 139  Recent Lipid Panel    Component Value Date/Time   CHOL 200 12/05/2021 1046   TRIG 104.0 12/05/2021 1046   HDL  66.60 12/05/2021 1046   CHOLHDL 3 12/05/2021 1046   VLDL 20.8 12/05/2021 1046    LDLCALC 113 (H) 12/05/2021 1046   LDLDIRECT 104.0 08/12/2017 0932    PHYSICAL EXAM:    VS:  BP 110/60 (BP Location: Right Arm, Patient Position: Sitting, Cuff Size: Normal)   Pulse 71   Ht '5\' 7"'$  (1.702 m)   Wt 140 lb (63.5 kg)   LMP 06/26/1985   SpO2 95%   BMI 21.93 kg/m   BMI: Body mass index is 21.93 kg/m.  Physical Exam Vitals reviewed.  Constitutional:      Appearance: She is well-developed.  HENT:     Head: Normocephalic and atraumatic.  Eyes:     General:        Right eye: No discharge.        Left eye: No discharge.  Neck:     Vascular: No JVD.  Cardiovascular:     Rate and Rhythm: Normal rate and regular rhythm.     Pulses:          Posterior tibial pulses are 2+ on the right side and 2+ on the left side.     Heart sounds: S1 normal and S2 normal. Heart sounds not distant. No midsystolic click and no opening snap. Murmur heard.     Systolic murmur is present with a grade of 1/6 at the upper right sternal border.     No friction rub.  Pulmonary:     Effort: Pulmonary effort is normal. No respiratory distress.     Breath sounds: Normal breath sounds. No decreased breath sounds, wheezing or rales.  Chest:     Chest wall: No tenderness.  Abdominal:     General: There is no distension.  Musculoskeletal:     Cervical back: Normal range of motion.     Right lower leg: No edema.     Left lower leg: No edema.  Skin:    General: Skin is warm and dry.     Nails: There is no clubbing.  Neurological:     Mental Status: She is alert and oriented to person, place, and time.  Psychiatric:        Speech: Speech normal.        Behavior: Behavior normal.        Thought Content: Thought content normal.        Judgment: Judgment normal.     Wt Readings from Last 3 Encounters:  12/25/21 140 lb (63.5 kg)  12/06/21 140 lb (63.5 kg)  12/05/21 139 lb 9.6 oz (63.3 kg)     ASSESSMENT & PLAN:   HFpEF with infiltrative cardiomyopathy concerning for cardiac  amyloidosis/pulmonary hypertension: She appears euvolemic and well compensated.  Cardiac MRI did demonstrate an infiltrative cardiomyopathy, concerning for cardiac amyloidosis.  We have discussed this diagnosis in detail and she continues to prefer conservative therapy with focusing on quality at this time and does not wish to pursue aggressive/invasive therapies or further testing.  Given her age, and in the context of her being largely asymptomatic, I feel this is reasonable.  She declines genetic testing.  We we will transition her from Lopressor to Toprol 12.5 mg daily.  She will otherwise remain on low-dose losartan 12.5 mg daily and furosemide.  Recent RHC showed normal left and right heart filling pressures with borderline pulmonary hypertension.  Persistent A-fib: Maintaining sinus rhythm.  We will transition her from Lopressor 25 mg twice daily to Toprol-XL 12.5 mg daily.  CHA2DS2-VASc at  least 5.  She remains on Xarelto 20 mg and does not meet reduced dosing criteria with creatinine clearance of 61.4.  No symptoms concerning for bleeding or falls.  Check BMP.  Recent CBC stable as outlined above.  Pericardial effusion: Stable on the recent echo and cardiac MRI with no evidence of cardiac tamponade physiology.  She remains on furosemide as outlined above.  Aortic insufficiency/mitral regurgitation: Monitor with periodic echo.  HTN: Blood pressure is well controlled in the office and soft at times at home.  Medication management as outlined above.  Mildly dilated ascending aorta: Most recent echo documented normal size and structure aorta.    Disposition: F/u with Dr. Saunders Revel or an APP in 6 months.   Medication Adjustments/Labs and Tests Ordered: Current medicines are reviewed at length with the patient today.  Concerns regarding medicines are outlined above. Medication changes, Labs and Tests ordered today are summarized above and listed in the Patient Instructions accessible in Encounters.    Signed, Christell Faith, PA-C 12/25/2021 4:16 PM     [place] [place], [place] [address] [phone]

## 2021-12-25 ENCOUNTER — Ambulatory Visit (INDEPENDENT_AMBULATORY_CARE_PROVIDER_SITE_OTHER): Payer: Medicare Other | Admitting: Physician Assistant

## 2021-12-25 ENCOUNTER — Encounter: Payer: Self-pay | Admitting: Physician Assistant

## 2021-12-25 ENCOUNTER — Ambulatory Visit: Payer: Medicare Other | Admitting: Physician Assistant

## 2021-12-25 ENCOUNTER — Ambulatory Visit
Admission: RE | Admit: 2021-12-25 | Discharge: 2021-12-25 | Disposition: A | Discharge status: Home / Self Care | Payer: Medicare Other | Source: Ambulatory Visit | Attending: Internal Medicine | Admitting: Internal Medicine

## 2021-12-25 ENCOUNTER — Telehealth: Payer: Self-pay | Admitting: *Deleted

## 2021-12-25 ENCOUNTER — Ambulatory Visit (INDEPENDENT_AMBULATORY_CARE_PROVIDER_SITE_OTHER): Payer: Medicare Other | Admitting: Podiatry

## 2021-12-25 ENCOUNTER — Other Ambulatory Visit
Admission: RE | Admit: 2021-12-25 | Discharge: 2021-12-25 | Disposition: A | Discharge status: Home / Self Care | Payer: Medicare Other | Source: Ambulatory Visit | Attending: Physician Assistant | Admitting: Physician Assistant

## 2021-12-25 VITALS — BP 110/60 | HR 71 | Ht 67.0 in | Wt 140.0 lb

## 2021-12-25 DIAGNOSIS — Z1231 Encounter for screening mammogram for malignant neoplasm of breast: Secondary | ICD-10-CM | POA: Insufficient documentation

## 2021-12-25 DIAGNOSIS — B351 Tinea unguium: Secondary | ICD-10-CM

## 2021-12-25 DIAGNOSIS — I5032 Chronic diastolic (congestive) heart failure: Secondary | ICD-10-CM | POA: Insufficient documentation

## 2021-12-25 DIAGNOSIS — M79675 Pain in left toe(s): Secondary | ICD-10-CM

## 2021-12-25 DIAGNOSIS — I4819 Other persistent atrial fibrillation: Secondary | ICD-10-CM | POA: Insufficient documentation

## 2021-12-25 DIAGNOSIS — M9905 Segmental and somatic dysfunction of pelvic region: Secondary | ICD-10-CM | POA: Diagnosis not present

## 2021-12-25 DIAGNOSIS — I428 Other cardiomyopathies: Secondary | ICD-10-CM | POA: Insufficient documentation

## 2021-12-25 DIAGNOSIS — M9903 Segmental and somatic dysfunction of lumbar region: Secondary | ICD-10-CM | POA: Diagnosis not present

## 2021-12-25 DIAGNOSIS — I272 Pulmonary hypertension, unspecified: Secondary | ICD-10-CM | POA: Diagnosis not present

## 2021-12-25 DIAGNOSIS — I10 Essential (primary) hypertension: Secondary | ICD-10-CM | POA: Diagnosis not present

## 2021-12-25 DIAGNOSIS — M5136 Other intervertebral disc degeneration, lumbar region: Secondary | ICD-10-CM | POA: Diagnosis not present

## 2021-12-25 DIAGNOSIS — I77810 Thoracic aortic ectasia: Secondary | ICD-10-CM

## 2021-12-25 DIAGNOSIS — M79674 Pain in right toe(s): Secondary | ICD-10-CM

## 2021-12-25 DIAGNOSIS — I340 Nonrheumatic mitral (valve) insufficiency: Secondary | ICD-10-CM

## 2021-12-25 DIAGNOSIS — M955 Acquired deformity of pelvis: Secondary | ICD-10-CM | POA: Diagnosis not present

## 2021-12-25 DIAGNOSIS — I3139 Other pericardial effusion (noninflammatory): Secondary | ICD-10-CM | POA: Diagnosis not present

## 2021-12-25 DIAGNOSIS — I351 Nonrheumatic aortic (valve) insufficiency: Secondary | ICD-10-CM | POA: Diagnosis not present

## 2021-12-25 LAB — BASIC METABOLIC PANEL
Anion gap: 6 (ref 5–15)
BUN: 34 mg/dL — ABNORMAL HIGH (ref 8–23)
CO2: 28 mmol/L (ref 22–32)
Calcium: 9.4 mg/dL (ref 8.9–10.3)
Chloride: 105 mmol/L (ref 98–111)
Creatinine, Ser: 0.84 mg/dL (ref 0.44–1.00)
GFR, Estimated: >60 mL/min (ref >60)
Glucose, Bld: 140 mg/dL — ABNORMAL HIGH (ref 70–99)
Potassium: 3.9 mmol/L (ref 3.5–5.1)
Sodium: 139 mmol/L (ref 135–145)

## 2021-12-25 MED ORDER — METOPROLOL SUCCINATE ER 25 MG PO TB24: 12.5000 mg | ORAL_TABLET | Freq: Every day | ORAL | 3 refills | Status: DC

## 2021-12-25 MED ORDER — RIVAROXABAN 20 MG PO TABS: 20.0000 mg | ORAL_TABLET | Freq: Every day | ORAL | 2 refills | Status: DC

## 2021-12-25 NOTE — Telephone Encounter (Signed)
Left voicemail message to call back for review of results and recommendations.  

## 2021-12-25 NOTE — Progress Notes (Signed)
  Subjective:  Patient ID: Deborah Baxter, female    DOB: [birth year],  MRN: [id number]  Chief Complaint  Patient presents with   Nail Problem    Nail trim    88 y.o. female returns for the above complaint.  Patient presents with thickened elongated dystrophic toenails x10.  Mild pain on palpation.  Patient states that she is not able to do it herself.  She would like for me to debride down.  She denies any other acute complaints  Objective:  There were no vitals filed for this visit. Podiatric Exam: Vascular: dorsalis pedis and posterior tibial pulses are palpable bilateral. Capillary return is immediate. Temperature gradient is WNL. Skin turgor WNL  Sensorium: Normal Semmes Weinstein monofilament test. Normal tactile sensation bilaterally. Nail Exam: Pt has thick disfigured discolored nails with subungual debris noted bilateral entire nail hallux through fifth toenails.  Pain on palpation to the nails. Ulcer Exam: There is no evidence of ulcer or pre-ulcerative changes or infection. Orthopedic Exam: Muscle tone and strength are WNL. No limitations in general ROM. No crepitus or effusions noted.  Skin: No Porokeratosis. No infection or ulcers    Assessment & Plan:   1. Pain due to onychomycosis of toenails of both feet      Patient was evaluated and treated and all questions answered.  Onychomycosis with pain  -Nails palliatively debrided as below. -Educated on self-care  Procedure: Nail Debridement Rationale: pain  Type of Debridement: manual, sharp debridement. Instrumentation: Nail nipper, rotary burr. Number of Nails: 10  Procedures and Treatment: Consent by patient was obtained for treatment procedures. The patient understood the discussion of treatment and procedures well. All questions were answered thoroughly reviewed. Debridement of mycotic and hypertrophic toenails, 1 through 5 bilateral and clearing of subungual debris. No ulceration, no infection noted.  Return  Visit-Office Procedure: Patient instructed to return to the office for a follow up visit 3 months for continued evaluation and treatment.  Boneta Lucks, DPM    No follow-ups on file.

## 2021-12-25 NOTE — Telephone Encounter (Signed)
-----   Message from Deborah Mu, PA-C sent at 12/25/2021  3:14 PM EDT ----- Please inform the patient her potassium is stable.  Renal function is mildly elevated.  Random glucose is mildly elevated.  Please have her decrease furosemide to 20 mg daily in an effort to minimize significant dehydration, given right heart cath showed normal left and right heart filling pressures with only borderline pulmonary hypertension.

## 2021-12-25 NOTE — Patient Instructions (Signed)
Medication Instructions:  Your physician has recommended you make the following change in your medication:   STOP Metoprolol Tartrate START Toprol XL (Metoprolol Succinate) 12.5 mg once daily   *If you need a refill on your cardiac medications before your next appointment, please call your pharmacy*   Lab Work: BMET today at the [facility] entrance and check in at registration.   If you have labs (blood work) drawn today and your tests are completely normal, you will receive your results only by: [place] (if you have MyChart) OR A paper copy in the mail If you have any lab test that is abnormal or we need to change your treatment, we will call you to review the results.   Testing/Procedures: None   Follow-Up: At [facility], you and your health needs are our priority.  As part of our continuing mission to provide you with exceptional heart care, we have created designated Provider Care Teams.  These Care Teams include your primary Cardiologist (physician) and Advanced Practice Providers (APPs -  Physician Assistants and Nurse Practitioners) who all work together to provide you with the care you need, when you need it.   Your next appointment:   6 month(s)  The format for your next appointment:   In Person  Provider:   Nelva Bush, MD or Christell Faith, PA-C        Important Information About Sugar

## 2021-12-26 MED ORDER — FUROSEMIDE 40 MG PO TABS: 20.0000 mg | ORAL_TABLET | Freq: Every day | ORAL | 0 refills | Status: DC

## 2021-12-26 NOTE — Telephone Encounter (Signed)
Called patient and reviewed result note. Patient verbalized understanding and agreed with plan.

## 2021-12-26 NOTE — Addendum Note (Signed)
Addended by: Kavin Leech on: 12/26/2021 12:29 PM   Modules accepted: Orders

## 2021-12-26 NOTE — Telephone Encounter (Signed)
Patient returned call

## 2021-12-27 ENCOUNTER — Telehealth: Payer: Self-pay | Admitting: *Deleted

## 2021-12-27 NOTE — Telephone Encounter (Signed)
-----   Message from Deborah Mu, PA-C sent at 12/25/2021  3:14 PM EDT ----- Please inform the patient her potassium is stable.  Renal function is mildly elevated.  Random glucose is mildly elevated.  Please have her decrease furosemide to 20 mg daily in an effort to minimize significant dehydration, given right heart cath showed normal left and right heart filling pressures with only borderline pulmonary hypertension.

## 2021-12-27 NOTE — Telephone Encounter (Signed)
Reviewed results and recommendations with patient. She verbalized understanding and stated she was previously called with results. Apologized for the duplicate call. No further questions at this time.

## 2022-01-14 DIAGNOSIS — M5136 Other intervertebral disc degeneration, lumbar region: Secondary | ICD-10-CM | POA: Diagnosis not present

## 2022-01-14 DIAGNOSIS — M5416 Radiculopathy, lumbar region: Secondary | ICD-10-CM | POA: Diagnosis not present

## 2022-01-14 DIAGNOSIS — M48062 Spinal stenosis, lumbar region with neurogenic claudication: Secondary | ICD-10-CM | POA: Diagnosis not present

## 2022-01-15 ENCOUNTER — Ambulatory Visit: Payer: Medicare Other | Admitting: Pulmonary Disease

## 2022-01-22 DIAGNOSIS — M5136 Other intervertebral disc degeneration, lumbar region: Secondary | ICD-10-CM | POA: Diagnosis not present

## 2022-01-22 DIAGNOSIS — M9903 Segmental and somatic dysfunction of lumbar region: Secondary | ICD-10-CM | POA: Diagnosis not present

## 2022-01-22 DIAGNOSIS — M9905 Segmental and somatic dysfunction of pelvic region: Secondary | ICD-10-CM | POA: Diagnosis not present

## 2022-01-22 DIAGNOSIS — M955 Acquired deformity of pelvis: Secondary | ICD-10-CM | POA: Diagnosis not present

## 2022-02-06 ENCOUNTER — Ambulatory Visit (INDEPENDENT_AMBULATORY_CARE_PROVIDER_SITE_OTHER): Payer: Medicare Other | Admitting: Internal Medicine

## 2022-02-06 ENCOUNTER — Encounter: Payer: Self-pay | Admitting: Internal Medicine

## 2022-02-06 DIAGNOSIS — D696 Thrombocytopenia, unspecified: Secondary | ICD-10-CM | POA: Diagnosis not present

## 2022-02-06 DIAGNOSIS — I5030 Unspecified diastolic (congestive) heart failure: Secondary | ICD-10-CM

## 2022-02-06 DIAGNOSIS — I4891 Unspecified atrial fibrillation: Secondary | ICD-10-CM | POA: Diagnosis not present

## 2022-02-06 DIAGNOSIS — I77810 Thoracic aortic ectasia: Secondary | ICD-10-CM

## 2022-02-06 DIAGNOSIS — E7800 Pure hypercholesterolemia, unspecified: Secondary | ICD-10-CM

## 2022-02-06 DIAGNOSIS — M4800 Spinal stenosis, site unspecified: Secondary | ICD-10-CM

## 2022-02-06 NOTE — Progress Notes (Signed)
Patient ID: Deborah Baxter, female   DOB: 07/11/1933, 88 y.o.   MRN: [id number]   Subjective:    Patient ID: Deborah Baxter, female    DOB: [birth year], 88 y.o.   MRN: [id number]   Patient here for  Chief Complaint  Patient presents with   Follow-up    2 mo   .   HPI Follow up regarding afib, HFpEF, hypertension and hypercholesterolemia.  Reports she is doing well.  Breathing stable.  No increased sob.  No increased cough or congestion.  Feeling better.  Eating.  No nausea or vomiting.  No increased bowel problems reported - miralax. Reviewed outside weights and blood pressures. Weighs daily.    Past Medical History:  Diagnosis Date   (HFpEF) heart failure with preserved ejection fraction ([place]) 04/24/2021   Pulmonary raised concern - question of pulmonary hypertension secondary to DD.  Question of need for right heart cath   A-fib [facility]) 04/24/2021   Acute heart failure with preserved ejection fraction (HCC)    Ascending aorta dilatation ([place]) 08/01/2021   Atrial fibrillation with rapid ventricular response ([place]) 03/20/2021   Congestion of nasal sinus 10/13/2021   Cough 04/23/2021   Diverticulosis 09/05/2012   Colonoscopy 08/31/12 - redundant colon and diverticulosis in the sigmoid colon, descending colon, transverse colon and hepatic flexure.  Recommend f/u colonoscopy in 10 years.    Elevated bilirubin 09/13/2021   Elevated blood pressure reading 12/05/2020   Health care maintenance 07/13/2014   Physical 07/12/14.  Colonoscopy 08/31/12.  Normal bone density 04/06/10.  Mammogram 05/30/20- [facility] I.     Hx of degenerative disc disease    Hypercholesterolemia    Hypertensive urgency 03/20/2021   Ingrown toenail 09/13/2021   Left hand pain 03/20/2021   Low back pain 01/29/2021   Lumbago 07/13/2014   Lumbar spinal stenosis 06/27/2012   Neuromuscular disorder (HCC)    pinched nerve   Numbness and tingling in left hand 08/21/2017   Osteoarthritis    Pleural effusion 09/13/2021   Pulmonary edema  03/20/2021   Seasonal allergies    Spinal stenosis    chronic back and leg pain   Stress 02/03/2021   Syncope 12/05/2020   Thrombocytopenia ([place]) 09/13/2021   Past Surgical History:  Procedure Laterality Date   APPENDECTOMY     bladder tack surgery  2000   BTL  1976   CARDIOVERSION N/A 05/11/2021   Procedure: CARDIOVERSION;  Surgeon: Minna Merritts, MD;  Location: [facility] ORS;  Service: Cardiovascular;  Laterality: N/A;   CARPAL TUNNEL RELEASE     CATARACT EXTRACTION W/PHACO Left 12/07/2015   Procedure: CATARACT EXTRACTION PHACO AND INTRAOCULAR LENS PLACEMENT ([place]);  Surgeon: Eulogio Bear, MD;  Location: [facility] ORS;  Service: Ophthalmology;  Laterality: Left;  [place] 1.23 AP% 23.2 CDE 19.28 Fluid Pack Lot # [id number]   EYE SURGERY  03/19/15   GANGLION CYST EXCISION     right hand   GAS/FLUID EXCHANGE Left 03/08/2015   Procedure: GAS/FLUID EXCHANGE;  Surgeon: Milus Height, MD;  Location: [facility] ORS;  Service: Ophthalmology;  Laterality: Left;   RIGHT HEART CATH N/A 11/06/2021   Procedure: RIGHT HEART CATH;  Surgeon: Nelva Bush, MD;  Location: [facility];  Service: Cardiovascular;  Laterality: N/A;   TONSILLECTOMY     TUBAL LIGATION     VITRECTOMY 25 GAUGE WITH SCLERAL BUCKLE Left 03/08/2015   Procedure: VITRECTOMY 25 GAUGE, membrane peel, 26 %  SF6 gas exchange;  Surgeon: Milus Height, MD;  Location: [facility]  ORS;  Service: Ophthalmology;  Laterality: Left;  [place] lot # [id number]   Family History  Problem Relation Age of Onset   Diabetes Mother    Heart disease Father        Unknown age of onset. Unknown if had CHF or heart attack.   Leukemia Father    Breast cancer Sister        in her 6's   Cancer Sister        lung cancer   Thyroid cancer Sister    Breast cancer Maternal Aunt    Colon cancer Neg Hx    Social History   Socioeconomic History   Marital status: Widowed    Spouse name: Not on file   Number of children: 5   Years of education: Not on file    Highest education level: Not on file  Occupational History   Not on file  Tobacco Use   Smoking status: Former    Packs/day: 0.25    Years: 10.00    Total pack years: 2.50    Types: Cigarettes    Quit date: 88    Years since quitting: 50.7   Smokeless tobacco: Never  Vaping Use   Vaping Use: Never used  Substance and Sexual Activity   Alcohol use: Yes    Alcohol/week: 0.0 standard drinks of alcohol    Comment: occassional   Drug use: No   Sexual activity: Not on file  Other Topics Concern   Not on file  Social History Narrative   Not on file   Social Determinants of Health   Financial Resource Strain: Low Risk  (08/20/2021)   Overall Financial Resource Strain (CARDIA)    Difficulty of Paying Living Expenses: Not hard at all  Food Insecurity: No Food Insecurity (08/20/2021)   Hunger Vital Sign    Worried About Running Out of Food in the Last Year: Never true    Ran Out of Food in the Last Year: Never true  Transportation Needs: No Transportation Needs (08/20/2021)   PRAPARE - Hydrologist (Medical): No    Lack of Transportation (Non-Medical): No  Physical Activity: Insufficiently Active (08/17/2020)   Exercise Vital Sign    Days of Exercise per Week: 3 days    Minutes of Exercise per Session: 20 min  Stress: No Stress Concern Present (08/20/2021)   [place]    Feeling of Stress : Not at all  Social Connections: Unknown (08/20/2021)   Social Connection and Isolation Panel [NHANES]    Frequency of Communication with Friends and Family: More than three times a week    Frequency of Social Gatherings with Friends and Family: More than three times a week    Attends Religious Services: More than 4 times per year    Active Member of [employer] or Organizations: Not on file    Attends Archivist Meetings: Not on file    Marital Status: Widowed     Review of Systems   Constitutional:  Negative for appetite change and unexpected weight change.  HENT:  Negative for congestion and sinus pressure.   Respiratory:  Negative for cough and chest tightness.        Overall breathing improved.   Cardiovascular:  Negative for chest pain, palpitations and leg swelling.  Gastrointestinal:  Negative for abdominal pain, diarrhea, nausea and vomiting.  Genitourinary:  Negative for difficulty urinating and dysuria.  Musculoskeletal:  Negative for joint  swelling and myalgias.  Skin:  Negative for color change and rash.  Neurological:  Negative for dizziness, light-headedness and headaches.  Psychiatric/Behavioral:  Negative for agitation and dysphoric mood.        Objective:     BP 110/80 (BP Location: Left Arm, Patient Position: Sitting, Cuff Size: Normal)   Pulse 89   Temp 97.8 F (36.6 C) (Oral)   Ht '5\' 7"'$  (1.702 m)   Wt 138 lb 12.8 oz (63 kg)   LMP 06/26/1985   SpO2 96%   BMI 21.74 kg/m  Wt Readings from Last 3 Encounters:  02/06/22 138 lb 12.8 oz (63 kg)  12/25/21 140 lb (63.5 kg)  12/06/21 140 lb (63.5 kg)    Physical Exam Vitals reviewed.  Constitutional:      General: She is not in acute distress.    Appearance: Normal appearance.  HENT:     Head: Normocephalic and atraumatic.     Right Ear: External ear normal.     Left Ear: External ear normal.  Eyes:     General: No scleral icterus.       Right eye: No discharge.        Left eye: No discharge.     Conjunctiva/sclera: Conjunctivae normal.  Neck:     Thyroid: No thyromegaly.  Cardiovascular:     Rate and Rhythm: Normal rate and regular rhythm.  Pulmonary:     Effort: No respiratory distress.     Breath sounds: Normal breath sounds. No wheezing.  Abdominal:     General: Bowel sounds are normal.     Palpations: Abdomen is soft.     Tenderness: There is no abdominal tenderness.  Musculoskeletal:        General: No swelling or tenderness.     Cervical back: Neck supple. No  tenderness.  Lymphadenopathy:     Cervical: No cervical adenopathy.  Skin:    Findings: No erythema or rash.  Neurological:     Mental Status: She is alert.  Psychiatric:        Mood and Affect: Mood normal.        Behavior: Behavior normal.      Outpatient Encounter Medications as of 02/06/2022  Medication Sig   acetaminophen (TYLENOL) 650 MG CR tablet Take 650 mg by mouth every 8 (eight) hours as needed for pain. Reported on 08/02/2015   furosemide (LASIX) 40 MG tablet Take 0.5 tablets (20 mg total) by mouth daily.   losartan (COZAAR) 25 MG tablet Take 0.5 tablets (12.5 mg total) by mouth daily.   metoprolol succinate (TOPROL XL) 25 MG 24 hr tablet Take 0.5 tablets (12.5 mg total) by mouth daily.   Multiple Vitamin (MULTIVITAMIN) tablet Take 1 tablet by mouth daily.   OVER THE COUNTER MEDICATION Place 1 drop into both eyes every 8 (eight) hours as needed (dry eyes.). Lubricant eye drops   Polyethylene Glycol 3350 (MIRALAX PO) Take 17 g by mouth every 3 (three) days. Every 3 days   potassium chloride SA (KLOR-CON M) 20 MEQ tablet Take 20 mEq by mouth. Takes occasionally   rivaroxaban (XARELTO) 20 MG TABS tablet Take 1 tablet (20 mg total) by mouth daily with supper.   Facility-Administered Encounter Medications as of 02/06/2022  Medication   sodium chloride flush (NS) 0.9 % injection 3 mL     Lab Results  Component Value Date   WBC 7.4 10/24/2021   HGB 13.9 10/24/2021   HCT 42.9 10/24/2021   PLT 160 10/24/2021  GLUCOSE 140 (H) 12/25/2021   CHOL 200 12/05/2021   TRIG 104.0 12/05/2021   HDL 66.60 12/05/2021   LDLDIRECT 104.0 08/12/2017   LDLCALC 113 (H) 12/05/2021   ALT 32 12/05/2021   AST 27 12/05/2021   NA 139 12/25/2021   K 3.9 12/25/2021   CL 105 12/25/2021   CREATININE 0.84 12/25/2021   BUN 34 (H) 12/25/2021   CO2 28 12/25/2021   TSH 2.75 07/31/2021   INR 1.2 03/20/2021    MM 3D SCREEN BREAST BILATERAL  Result Date: 12/26/2021 CLINICAL DATA:  Screening. EXAM:  DIGITAL SCREENING BILATERAL MAMMOGRAM WITH TOMOSYNTHESIS AND CAD TECHNIQUE: Bilateral screening digital craniocaudal and mediolateral oblique mammograms were obtained. Bilateral screening digital breast tomosynthesis was performed. The images were evaluated with computer-aided detection. COMPARISON:  Previous exam(s). ACR Breast Density Category b: There are scattered areas of fibroglandular density. FINDINGS: There are no findings suspicious for malignancy. IMPRESSION: No mammographic evidence of malignancy. A result letter of this screening mammogram will be mailed directly to the patient. RECOMMENDATION: Screening mammogram in one year. (Code:SM-B-01Y) BI-RADS CATEGORY  1: Negative. Electronically Signed   By: Fidela Salisbury M.D.   On: 12/26/2021 16:42       Assessment & Plan:   Problem List Items Addressed This Visit     (HFpEF) heart failure with preserved ejection fraction (HCC)    Breathing overall improved.   No evidence of volume overload today on exam. On losartan 12.'5mg'$  q day.  Follow.  Check metabolic panel.       A-fib ([place])    Continues on metoprolol and xarelto.  Stable. Follow.       Ascending aorta dilatation (HCC)     Continue adequate blood pressure control.  Follow.       Hypercholesterolemia    Low cholesterol diet and exercise.  Have discussed calculated cholesterol risk and recommendation to start cholesterol medication.  She declines.  Follow lipid panel.        Spinal stenosis    Chronic back and leg pain.  Sees physiatry.  Had discussed gabapentin. Pt declined. Desires no further medication.  Follow.       Thrombocytopenia ([place])    Abdominal ultrasound revealed spleen size - wnl.  hgb wnl.  Follow cbc.         Einar Pheasant, MD

## 2022-02-17 ENCOUNTER — Encounter: Payer: Self-pay | Admitting: Internal Medicine

## 2022-02-17 NOTE — Assessment & Plan Note (Signed)
Continues on metoprolol and xarelto.  Stable. Follow.  

## 2022-02-17 NOTE — Assessment & Plan Note (Signed)
Chronic back and leg pain.  Sees physiatry.  Had discussed gabapentin. Pt declined. Desires no further medication.  Follow.

## 2022-02-17 NOTE — Assessment & Plan Note (Signed)
Breathing overall improved.   No evidence of volume overload today on exam. On losartan 12.'5mg'$  q day.  Follow.  Check metabolic panel.

## 2022-02-17 NOTE — Assessment & Plan Note (Signed)
Abdominal ultrasound revealed spleen size - wnl.  hgb wnl.  Follow cbc.  

## 2022-02-17 NOTE — Assessment & Plan Note (Signed)
Continue adequate blood pressure control.  Follow.  

## 2022-02-17 NOTE — Assessment & Plan Note (Signed)
Low cholesterol diet and exercise.  Have discussed calculated cholesterol risk and recommendation to start cholesterol medication.  She declines.  Follow lipid panel.   

## 2022-02-19 DIAGNOSIS — M955 Acquired deformity of pelvis: Secondary | ICD-10-CM | POA: Diagnosis not present

## 2022-02-19 DIAGNOSIS — M5136 Other intervertebral disc degeneration, lumbar region: Secondary | ICD-10-CM | POA: Diagnosis not present

## 2022-02-19 DIAGNOSIS — M9903 Segmental and somatic dysfunction of lumbar region: Secondary | ICD-10-CM | POA: Diagnosis not present

## 2022-02-19 DIAGNOSIS — M9905 Segmental and somatic dysfunction of pelvic region: Secondary | ICD-10-CM | POA: Diagnosis not present

## 2022-03-14 ENCOUNTER — Telehealth: Payer: Self-pay | Admitting: Internal Medicine

## 2022-03-14 ENCOUNTER — Ambulatory Visit (INDEPENDENT_AMBULATORY_CARE_PROVIDER_SITE_OTHER): Payer: Medicare Other | Admitting: Pulmonary Disease

## 2022-03-14 ENCOUNTER — Encounter: Payer: Self-pay | Admitting: Pulmonary Disease

## 2022-03-14 VITALS — BP 122/80 | HR 73 | Temp 97.5°F | Ht 67.0 in | Wt 139.2 lb

## 2022-03-14 DIAGNOSIS — I5032 Chronic diastolic (congestive) heart failure: Secondary | ICD-10-CM | POA: Diagnosis not present

## 2022-03-14 DIAGNOSIS — J90 Pleural effusion, not elsewhere classified: Secondary | ICD-10-CM | POA: Diagnosis not present

## 2022-03-14 DIAGNOSIS — I425 Other restrictive cardiomyopathy: Secondary | ICD-10-CM

## 2022-03-14 NOTE — Telephone Encounter (Signed)
Left voicemail to call back for review of information

## 2022-03-14 NOTE — Patient Instructions (Signed)
We will see him in follow-up on an as-needed basis.

## 2022-03-14 NOTE — Telephone Encounter (Signed)
Patient came by office  The price of Xarelto went up $47 to $100 and would like to know if there is a generic or assistance Please call daughter Malachy Mood to discuss

## 2022-03-14 NOTE — Telephone Encounter (Signed)
**Note De-Identified Deborah Baxter Obfuscation** The pt sees Dr End in our [place] office. Forwarding to their triage pool.

## 2022-03-14 NOTE — Progress Notes (Signed)
Subjective:    Patient ID: Deborah Baxter, female    DOB: December 17, 1933, 88 y.o.   MRN: [id number] Patient Care Team: Dale Ashmore, MD as PCP - General (Internal Medicine) End, Cristal Deer, MD as PCP - Cardiology (Cardiology)  Chief Complaint  Patient presents with   Follow-up    No SOB, wheezing or cough.     HPI Deborah Baxter is an 88 year old remote former smoker with minimal [place] exposure in the past and a history as noted below, who presents for follow-up from her initial visit on June 2023.  Recall that at that time she was seen for a left pleural effusion which had actually responded to diuretics.  The patient was noted to have heart failure.  She has issues with chronic heart failure with preserved ejection fraction.  Most recent echocardiographic parameters were consistent with possible restrictive cardiomyopathy.  She underwent cardiac MRI for morphology study on 12 December 2021 and this was consistent with infiltrative cardiomyopathy more than likely amyloid.  Patient is following up with cardiology with this regard.  She has not had any shortness of breath, no wheezing or cough since her diuretics were adjusted.  She has not had any cough or sputum production.  Weight and appetite have been stable. She does not endorse any other complaint today.   DATA 08/17/2021 echocardiogram: LVEF 55 to 60%, SEVERE left ventricular hypertrophy, moderately elevated pulmonary artery systolic pressure, estimated right ventricular systolic pressure of 50.3 mmHg, moderately dilated left atrium, moderately dilated right atrium, mild to moderate mitral valve regurgitation, moderate tricuspid regurgitation right atrial pressure estimated at 15 mmHg, moderate pericardial effusion without tamponade physiology 09/12/2021 chest x-ray PA and lateral: Cardiomegaly, decreasing size of left-sided effusion (small) 12/12/2021 MR cardiac morphology: Normal left and right ventricular function, LVEF 59%, transmural gadolinium  enhancement in a heterogeneous pattern in the left myocardium.  Small pericardial effusion, moderate asymmetric left ventricular hypertrophy, severely dilated left atrium findings consistent with infiltrative cardiomyopathy such as cardiac amyloidosis.  Review of Systems A 10 point review of systems was performed and it is as noted above otherwise negative.  Patient Active Problem List   Diagnosis Date Noted   Hx of degenerative disc disease 10/23/2021   Osteoarthritis 10/23/2021   Seasonal allergies 10/23/2021   Spinal stenosis 10/23/2021   Congestion of nasal sinus 10/13/2021   Elevated bilirubin 09/13/2021   Thrombocytopenia (HCC) 09/13/2021   Pleural effusion 09/13/2021   Ingrown toenail 09/13/2021   Ascending aorta dilatation (HCC) 08/01/2021   A-fib (HCC) 04/24/2021   (HFpEF) heart failure with preserved ejection fraction (HCC) 04/24/2021   Cough 04/23/2021   Acute heart failure with preserved ejection fraction [facility])    Atrial fibrillation with rapid ventricular response (HCC) 03/20/2021   Hypertensive urgency 03/20/2021   Pulmonary edema 03/20/2021   Left hand pain 03/20/2021   Stress 02/03/2021   Low back pain 01/29/2021   Syncope 12/05/2020   Elevated blood pressure reading 12/05/2020   Numbness and tingling in left hand 08/21/2017   Lumbago 07/13/2014   Health care maintenance 07/13/2014   Diverticulosis 09/05/2012   Hypercholesterolemia 06/27/2012   Lumbar spinal stenosis 06/27/2012   Social History   Tobacco Use   Smoking status: Former    Packs/day: 0.25    Years: 10.00    Total pack years: 2.50    Types: Cigarettes    Quit date: 1973    Years since quitting: 50.8   Smokeless tobacco: Never  Substance Use Topics   Alcohol use: Yes  Alcohol/week: 0.0 standard drinks of alcohol    Comment: occassional   Allergies  Allergen Reactions   Quinolones Other (See Comments)    Avoid FQ with Ao dilation  *Pt is unaware of allergy    Current Meds   Medication Sig   acetaminophen (TYLENOL) 650 MG CR tablet Take 650 mg by mouth every 8 (eight) hours as needed for pain. Reported on 08/02/2015   furosemide (LASIX) 40 MG tablet Take 0.5 tablets (20 mg total) by mouth daily.   losartan (COZAAR) 25 MG tablet Take 0.5 tablets (12.5 mg total) by mouth daily.   metoprolol succinate (TOPROL XL) 25 MG 24 hr tablet Take 0.5 tablets (12.5 mg total) by mouth daily.   Multiple Vitamin (MULTIVITAMIN) tablet Take 1 tablet by mouth daily.   OVER THE COUNTER MEDICATION Place 1 drop into both eyes every 8 (eight) hours as needed (dry eyes.). Lubricant eye drops   Polyethylene Glycol 3350 (MIRALAX PO) Take 17 g by mouth every 3 (three) days. Every 3 days   potassium chloride SA (KLOR-CON M) 20 MEQ tablet Take 20 mEq by mouth. Takes occasionally   rivaroxaban (XARELTO) 20 MG TABS tablet Take 1 tablet (20 mg total) by mouth daily with supper.   Immunization History  Administered Date(s) Administered   Influenza Split 02/15/2014   Influenza, High Dose Seasonal PF 03/05/2018, 03/16/2019   Influenza-Unspecified 03/04/2012, 03/24/2013, 03/08/2014, 02/20/2015, 03/05/2016, 03/11/2017, 03/15/2019, 04/24/2020   Moderna Sars-Covid-2 Vaccination 06/25/2019, 07/23/2019, 05/08/2020   Pneumococcal Conjugate-13 07/08/2013   Pneumococcal Polysaccharide-23 07/12/2014   Zoster Recombinat (Shingrix) 04/13/2019, 09/10/2019        Objective:   Physical Exam BP 122/80 (BP Location: Left Arm, Cuff Size: Normal)   Pulse 73   Temp (!) 97.5 F (36.4 C)   Ht  (1.702 m)   Wt 139 lb 3.2 oz (63.1 kg)   LMP 06/26/1985   SpO2 98%   BMI 21.80 kg/m   GENERAL: Well-developed, well-nourished elderly woman in no acute distress.  She presents in transport chair.  No conversational dyspnea. HEAD: Normocephalic, atraumatic.  EYES: Pupils equal, round, reactive to light.  No scleral icterus.  MOUTH: Oral mucosa moist, no thrush. NECK: Supple. No thyromegaly. Trachea midline. No  JVD.  No adenopathy. PULMONARY: Good air entry bilaterally.  No adventitious sounds. CARDIOVASCULAR: S1 and S2. Regular rate and rhythm.  Grade 2/6 to 3/6 systolic ejection murmur consistent with mitral regurgitation. ABDOMEN: Benign. MUSCULOSKELETAL: No joint deformity, no clubbing, no edema.  NEUROLOGIC: Grossly nonfocal exam.  Gait not tested. SKIN: Intact,warm,dry. PSYCH: Mood and behavior normal.      Assessment & Plan:     ICD-10-CM   1. Pleural effusion  J90    For the most part resolved Minimal fluid left on the left Responsive to volume status Due to heart failure    2. Chronic heart failure with preserved ejection fraction (HCC)  I50.32    This issue adds complexity to her management Follows with cardiology Has responded well to diuretics    3. Restrictive cardiomyopathy (HCC)  I42.5    Recent cardiac MRI consistent with infiltrative cardiomyopathy Cardiac amyloid likely Patient follows with cardiology     Our standpoint the patient can follow-up as needed.  Gailen Shelter, MD Advanced Bronchoscopy PCCM [facility]    *This note was dictated using voice recognition software/Dragon.  Despite best efforts to proofread, errors can occur which can change the meaning. Any transcriptional errors that result from this process are unintentional and  may not be fully corrected at the time of dictation.

## 2022-03-14 NOTE — Telephone Encounter (Signed)
Spoke with patients daughter per release form. She reviewed cost of patients medication and that her mother  had been taking it every other day due to cost. We discussed options such as the [place] select number offering it for $85.00 per month and/or patient assistance application. Advised we would need copy of social security statement and her signature on form in order to process. She stated that they would be by tomorrow to sign and verbalized understanding with no further questions at this time.

## 2022-03-15 MED ORDER — RIVAROXABAN 20 MG PO TABS: 20.0000 mg | ORAL_TABLET | Freq: Every day | ORAL | 3 refills | Status: DC

## 2022-03-15 NOTE — Telephone Encounter (Signed)
Daughter came by with assistance paperwork and income. Application completed and will fax and file.

## 2022-03-19 DIAGNOSIS — M5136 Other intervertebral disc degeneration, lumbar region: Secondary | ICD-10-CM | POA: Diagnosis not present

## 2022-03-19 DIAGNOSIS — M9903 Segmental and somatic dysfunction of lumbar region: Secondary | ICD-10-CM | POA: Diagnosis not present

## 2022-03-19 DIAGNOSIS — M955 Acquired deformity of pelvis: Secondary | ICD-10-CM | POA: Diagnosis not present

## 2022-03-19 DIAGNOSIS — M9905 Segmental and somatic dysfunction of pelvic region: Secondary | ICD-10-CM | POA: Diagnosis not present

## 2022-03-21 ENCOUNTER — Telehealth: Payer: Self-pay | Admitting: Internal Medicine

## 2022-03-28 ENCOUNTER — Ambulatory Visit (INDEPENDENT_AMBULATORY_CARE_PROVIDER_SITE_OTHER): Payer: Medicare Other | Admitting: Podiatry

## 2022-03-28 ENCOUNTER — Telehealth: Payer: Self-pay | Admitting: *Deleted

## 2022-03-28 DIAGNOSIS — M79674 Pain in right toe(s): Secondary | ICD-10-CM | POA: Diagnosis not present

## 2022-03-28 DIAGNOSIS — M79675 Pain in left toe(s): Secondary | ICD-10-CM | POA: Diagnosis not present

## 2022-03-28 DIAGNOSIS — B351 Tinea unguium: Secondary | ICD-10-CM

## 2022-03-28 MED ORDER — RIVAROXABAN 20 MG PO TABS: 20.0000 mg | ORAL_TABLET | Freq: Every day | ORAL | 3 refills | Status: DC

## 2022-03-28 NOTE — Telephone Encounter (Signed)
Pt needed prescription sent to specialty pharmacy.

## 2022-03-28 NOTE — Progress Notes (Signed)
  Subjective:  Patient ID: Deborah Baxter, female    DOB: [birth year],  MRN: [id number]  Chief Complaint  Patient presents with   Nail Problem    Nail trim    88 y.o. female returns for the above complaint.  Patient presents with thickened elongated dystrophic toenails x10.  Mild pain on palpation.  Patient states that she is not able to do it herself.  She would like for me to debride down.  She denies any other acute complaints  Objective:  There were no vitals filed for this visit. Podiatric Exam: Vascular: dorsalis pedis and posterior tibial pulses are palpable bilateral. Capillary return is immediate. Temperature gradient is WNL. Skin turgor WNL  Sensorium: Normal Semmes Weinstein monofilament test. Normal tactile sensation bilaterally. Nail Exam: Pt has thick disfigured discolored nails with subungual debris noted bilateral entire nail hallux through fifth toenails.  Pain on palpation to the nails. Ulcer Exam: There is no evidence of ulcer or pre-ulcerative changes or infection. Orthopedic Exam: Muscle tone and strength are WNL. No limitations in general ROM. No crepitus or effusions noted.  Skin: No Porokeratosis. No infection or ulcers    Assessment & Plan:   1. Pain due to onychomycosis of toenails of both feet       Patient was evaluated and treated and all questions answered.  Onychomycosis with pain  -Nails palliatively debrided as below. -Educated on self-care  Procedure: Nail Debridement Rationale: pain  Type of Debridement: manual, sharp debridement. Instrumentation: Nail nipper, rotary burr. Number of Nails: 10  Procedures and Treatment: Consent by patient was obtained for treatment procedures. The patient understood the discussion of treatment and procedures well. All questions were answered thoroughly reviewed. Debridement of mycotic and hypertrophic toenails, 1 through 5 bilateral and clearing of subungual debris. No ulceration, no infection noted.  Return  Visit-Office Procedure: Patient instructed to return to the office for a follow up visit 3 months for continued evaluation and treatment.  Boneta Lucks, DPM    No follow-ups on file.

## 2022-04-01 ENCOUNTER — Encounter (INDEPENDENT_AMBULATORY_CARE_PROVIDER_SITE_OTHER): Payer: Self-pay

## 2022-04-01 DIAGNOSIS — Z23 Encounter for immunization: Secondary | ICD-10-CM | POA: Diagnosis not present

## 2022-04-16 DIAGNOSIS — M9903 Segmental and somatic dysfunction of lumbar region: Secondary | ICD-10-CM | POA: Diagnosis not present

## 2022-04-16 DIAGNOSIS — M955 Acquired deformity of pelvis: Secondary | ICD-10-CM | POA: Diagnosis not present

## 2022-04-16 DIAGNOSIS — M9905 Segmental and somatic dysfunction of pelvic region: Secondary | ICD-10-CM | POA: Diagnosis not present

## 2022-04-16 DIAGNOSIS — M5136 Other intervertebral disc degeneration, lumbar region: Secondary | ICD-10-CM | POA: Diagnosis not present

## 2022-05-14 ENCOUNTER — Ambulatory Visit (INDEPENDENT_AMBULATORY_CARE_PROVIDER_SITE_OTHER): Payer: Medicare Other | Admitting: Internal Medicine

## 2022-05-14 ENCOUNTER — Encounter: Payer: Self-pay | Admitting: Internal Medicine

## 2022-05-14 VITALS — BP 124/80 | HR 74 | Temp 97.6°F | Resp 15 | Ht 67.0 in | Wt 139.0 lb

## 2022-05-14 DIAGNOSIS — R17 Unspecified jaundice: Secondary | ICD-10-CM

## 2022-05-14 DIAGNOSIS — E7800 Pure hypercholesterolemia, unspecified: Secondary | ICD-10-CM

## 2022-05-14 DIAGNOSIS — M5136 Other intervertebral disc degeneration, lumbar region: Secondary | ICD-10-CM | POA: Diagnosis not present

## 2022-05-14 DIAGNOSIS — I4891 Unspecified atrial fibrillation: Secondary | ICD-10-CM

## 2022-05-14 DIAGNOSIS — I5030 Unspecified diastolic (congestive) heart failure: Secondary | ICD-10-CM | POA: Diagnosis not present

## 2022-05-14 DIAGNOSIS — M9905 Segmental and somatic dysfunction of pelvic region: Secondary | ICD-10-CM | POA: Diagnosis not present

## 2022-05-14 DIAGNOSIS — M955 Acquired deformity of pelvis: Secondary | ICD-10-CM | POA: Diagnosis not present

## 2022-05-14 DIAGNOSIS — I77810 Thoracic aortic ectasia: Secondary | ICD-10-CM | POA: Diagnosis not present

## 2022-05-14 DIAGNOSIS — M9903 Segmental and somatic dysfunction of lumbar region: Secondary | ICD-10-CM | POA: Diagnosis not present

## 2022-05-14 DIAGNOSIS — M48061 Spinal stenosis, lumbar region without neurogenic claudication: Secondary | ICD-10-CM | POA: Diagnosis not present

## 2022-05-14 DIAGNOSIS — D696 Thrombocytopenia, unspecified: Secondary | ICD-10-CM | POA: Diagnosis not present

## 2022-05-14 LAB — CBC WITH DIFFERENTIAL/PLATELET
Basophils Absolute: 0.1 10*3/uL (ref 0.0–0.1)
Basophils Relative: 0.9 % (ref 0.0–3.0)
Eosinophils Absolute: 0.1 10*3/uL (ref 0.0–0.7)
Eosinophils Relative: 1.4 % (ref 0.0–5.0)
HCT: 40.1 % (ref 36.0–46.0)
Hemoglobin: 13.6 g/dL (ref 12.0–15.0)
Lymphocytes Relative: 35.6 % (ref 12.0–46.0)
Lymphs Abs: 2.1 10*3/uL (ref 0.7–4.0)
MCHC: 33.9 g/dL (ref 30.0–36.0)
MCV: 97.4 fl (ref 78.0–100.0)
Monocytes Absolute: 0.4 10*3/uL (ref 0.1–1.0)
Monocytes Relative: 6.9 % (ref 3.0–12.0)
Neutro Abs: 3.2 10*3/uL (ref 1.4–7.7)
Neutrophils Relative %: 55.2 % (ref 43.0–77.0)
Platelets: 204 10*3/uL (ref 150.0–400.0)
RBC: 4.12 Mil/uL (ref 3.87–5.11)
RDW: 13.3 % (ref 11.5–15.5)
WBC: 5.8 10*3/uL (ref 4.0–10.5)

## 2022-05-14 LAB — BASIC METABOLIC PANEL
BUN: 16 mg/dL (ref 6–23)
CO2: 32 mEq/L (ref 19–32)
Calcium: 9.9 mg/dL (ref 8.4–10.5)
Chloride: 101 mEq/L (ref 96–112)
Creatinine, Ser: 0.57 mg/dL (ref 0.40–1.20)
GFR: 82.35 mL/min (ref >60.00)
Glucose, Bld: 86 mg/dL (ref 70–99)
Potassium: 4.4 mEq/L (ref 3.5–5.1)
Sodium: 138 mEq/L (ref 135–145)

## 2022-05-14 LAB — LIPID PANEL
Cholesterol: 221 mg/dL — ABNORMAL HIGH (ref 0–200)
HDL: 83.3 mg/dL (ref >39.00)
LDL Cholesterol: 122 mg/dL — ABNORMAL HIGH (ref 0–99)
NonHDL: 137.95
Total CHOL/HDL Ratio: 3
Triglycerides: 81 mg/dL (ref 0.0–149.0)
VLDL: 16.2 mg/dL (ref 0.0–40.0)

## 2022-05-14 LAB — HEPATIC FUNCTION PANEL
ALT: 26 U/L (ref 0–35)
AST: 25 U/L (ref 0–37)
Albumin: 4.2 g/dL (ref 3.5–5.2)
Alkaline Phosphatase: 76 U/L (ref 39–117)
Bilirubin, Direct: 0.1 mg/dL (ref 0.0–0.3)
Total Bilirubin: 0.7 mg/dL (ref 0.2–1.2)
Total Protein: 6.6 g/dL (ref 6.0–8.3)

## 2022-05-14 LAB — TSH: TSH: 1.48 u[IU]/mL (ref 0.35–5.50)

## 2022-05-14 NOTE — Progress Notes (Signed)
Patient ID: Deborah Baxter, female   DOB: [birth year], 88 y.o.   MRN: [id number] Patient ID: Deborah Baxter, female   DOB: 05/22/1934, 88 y.o.   MRN: [id number]   Subjective:    Patient ID: Deborah Baxter, female    DOB: [birth year], 88 y.o.   MRN: [id number]   Patient here for  Chief Complaint  Patient presents with   Hyperlipidemia   .   HPI Follow up regarding afib, HFpEF, hypertension and hypercholesterolemia.  Reports she is doing well.  Breathing stable.  No increased sob.  No increased cough or congestion.  Feeling better.  Eating.  No nausea or vomiting.  No increased bowel problems reported - miralax. Reviewed outside weights and blood pressures. Weighs daily.    Past Medical History:  Diagnosis Date   (HFpEF) heart failure with preserved ejection fraction ([place]) 04/24/2021   Pulmonary raised concern - question of pulmonary hypertension secondary to DD.  Question of need for right heart cath   A-fib [facility]) 04/24/2021   Acute heart failure with preserved ejection fraction (HCC)    Ascending aorta dilatation ([place]) 08/01/2021   Atrial fibrillation with rapid ventricular response ([place]) 03/20/2021   Congestion of nasal sinus 10/13/2021   Cough 04/23/2021   Diverticulosis 09/05/2012   Colonoscopy 08/31/12 - redundant colon and diverticulosis in the sigmoid colon, descending colon, transverse colon and hepatic flexure.  Recommend f/u colonoscopy in 10 years.    Elevated bilirubin 09/13/2021   Elevated blood pressure reading 12/05/2020   Health care maintenance 07/13/2014   Physical 07/12/14.  Colonoscopy 08/31/12.  Normal bone density 04/06/10.  Mammogram 05/30/20- Bir[facility] I.     Hx of degenerative disc disease    Hypercholesterolemia    Hypertensive urgency 03/20/2021   Ingrown toenail 09/13/2021   Left hand pain 03/20/2021   Low back pain 01/29/2021   Lumbago 07/13/2014   Lumbar spinal stenosis 06/27/2012   Neuromuscular disorder (HCC)    pinched nerve   Numbness and tingling in left hand 08/21/2017    Osteoarthritis    Pleural effusion 09/13/2021   Pulmonary edema 03/20/2021   Seasonal allergies    Spinal stenosis    chronic back and leg pain   Stress 02/03/2021   Syncope 12/05/2020   Thrombocytopenia ([place]) 09/13/2021   Past Surgical History:  Procedure Laterality Date   APPENDECTOMY     bladder tack surgery  2000   BTL  1976   CARDIOVERSION N/A 05/11/2021   Procedure: CARDIOVERSION;  Surgeon: Minna Merritts, MD;  Location: [facility] ORS;  Service: Cardiovascular;  Laterality: N/A;   CARPAL TUNNEL RELEASE     CATARACT EXTRACTION W/PHACO Left 12/07/2015   Procedure: CATARACT EXTRACTION PHACO AND INTRAOCULAR LENS PLACEMENT ([place]);  Surgeon: Eulogio Bear, MD;  Location: [facility] ORS;  Service: Ophthalmology;  Laterality: Left;  [place] 1.23 AP% 23.2 CDE 19.28 Fluid Pack Lot # [id number]   EYE SURGERY  03/19/15   GANGLION CYST EXCISION     right hand   GAS/FLUID EXCHANGE Left 03/08/2015   Procedure: GAS/FLUID EXCHANGE;  Surgeon: Milus Height, MD;  Location: [facility] ORS;  Service: Ophthalmology;  Laterality: Left;   RIGHT HEART CATH N/A 11/06/2021   Procedure: RIGHT HEART CATH;  Surgeon: Nelva Bush, MD;  Location: [place] [facility];  Service: Cardiovascular;  Laterality: N/A;   TONSILLECTOMY     TUBAL LIGATION     VITRECTOMY 25 GAUGE WITH SCLERAL BUCKLE Left 03/08/2015   Procedure: VITRECTOMY 25 GAUGE, membrane peel, 26 %  SF6 gas exchange;  Surgeon: Milus Height, MD;  Location: [facility] ORS;  Service: Ophthalmology;  Laterality: Left;  casette lot # [id number]   Family History  Problem Relation Age of Onset   Diabetes Mother    Heart disease Father        Unknown age of onset. Unknown if had CHF or heart attack.   Leukemia Father    Breast cancer Sister        in her 36's   Cancer Sister        lung cancer   Thyroid cancer Sister    Breast cancer Maternal Aunt    Colon cancer Neg Hx    Social History   Socioeconomic History   Marital status: Widowed    Spouse name: Not on file    Number of children: 5   Years of education: Not on file   Highest education level: Not on file  Occupational History   Not on file  Tobacco Use   Smoking status: Former    Packs/day: 0.25    Years: 10.00    Total pack years: 2.50    Types: Cigarettes    Quit date: 2    Years since quitting: 51.0   Smokeless tobacco: Never  Vaping Use   Vaping Use: Never used  Substance and Sexual Activity   Alcohol use: Yes    Alcohol/week: 0.0 standard drinks of alcohol    Comment: occassional   Drug use: No   Sexual activity: Not on file  Other Topics Concern   Not on file  Social History Narrative   Not on file   Social Determinants of Health   Financial Resource Strain: Low Risk  (08/20/2021)   Overall Financial Resource Strain (CARDIA)    Difficulty of Paying Living Expenses: Not hard at all  Food Insecurity: No Food Insecurity (08/20/2021)   Hunger Vital Sign    Worried About Running Out of Food in the Last Year: Never true    Ran Out of Food in the Last Year: Never true  Transportation Needs: No Transportation Needs (08/20/2021)   PRAPARE - Hydrologist (Medical): No    Lack of Transportation (Non-Medical): No  Physical Activity: Insufficiently Active (08/17/2020)   Exercise Vital Sign    Days of Exercise per Week: 3 days    Minutes of Exercise per Session: 20 min  Stress: No Stress Concern Present (08/20/2021)   [place]    Feeling of Stress : Not at all  Social Connections: Unknown (08/20/2021)   Social Connection and Isolation Panel [NHANES]    Frequency of Communication with Friends and Family: More than three times a week    Frequency of Social Gatherings with Friends and Family: More than three times a week    Attends Religious Services: More than 4 times per year    Active Member of [employer] or Organizations: Not on file    Attends Archivist Meetings: Not on file     Marital Status: Widowed     Review of Systems  Constitutional:  Negative for appetite change and unexpected weight change.  HENT:  Negative for congestion and sinus pressure.   Respiratory:  Negative for cough and chest tightness.        Overall breathing improved.   Cardiovascular:  Negative for chest pain, palpitations and leg swelling.  Gastrointestinal:  Negative for abdominal pain, diarrhea, nausea and vomiting.  Genitourinary:  Negative  for difficulty urinating and dysuria.  Musculoskeletal:  Negative for joint swelling and myalgias.  Skin:  Negative for color change and rash.  Neurological:  Negative for dizziness, light-headedness and headaches.  Psychiatric/Behavioral:  Negative for agitation and dysphoric mood.        Objective:     BP 124/80 (BP Location: Left Arm, Patient Position: Sitting, Cuff Size: Small)   Pulse 74   Temp 97.6 F (36.4 C) (Temporal)   Resp 15   Ht '5\' 7"'$  (1.702 m)   Wt 139 lb (63 kg)   LMP 06/26/1985   SpO2 93%   BMI 21.77 kg/m  Wt Readings from Last 3 Encounters:  05/14/22 139 lb (63 kg)  03/14/22 139 lb 3.2 oz (63.1 kg)  02/06/22 138 lb 12.8 oz (63 kg)    Physical Exam Vitals reviewed.  Constitutional:      General: She is not in acute distress.    Appearance: Normal appearance.  HENT:     Head: Normocephalic and atraumatic.     Right Ear: External ear normal.     Left Ear: External ear normal.  Eyes:     General: No scleral icterus.       Right eye: No discharge.        Left eye: No discharge.     Conjunctiva/sclera: Conjunctivae normal.  Neck:     Thyroid: No thyromegaly.  Cardiovascular:     Rate and Rhythm: Normal rate and regular rhythm.  Pulmonary:     Effort: No respiratory distress.     Breath sounds: Normal breath sounds. No wheezing.  Abdominal:     General: Bowel sounds are normal.     Palpations: Abdomen is soft.     Tenderness: There is no abdominal tenderness.  Musculoskeletal:        General: No  swelling or tenderness.     Cervical back: Neck supple. No tenderness.  Lymphadenopathy:     Cervical: No cervical adenopathy.  Skin:    Findings: No erythema or rash.  Neurological:     Mental Status: She is alert.  Psychiatric:        Mood and Affect: Mood normal.        Behavior: Behavior normal.      Outpatient Encounter Medications as of 05/14/2022  Medication Sig   acetaminophen (TYLENOL) 650 MG CR tablet Take 650 mg by mouth every 8 (eight) hours as needed for pain. Reported on 08/02/2015   furosemide (LASIX) 40 MG tablet Take 0.5 tablets (20 mg total) by mouth daily.   losartan (COZAAR) 25 MG tablet Take 0.5 tablets (12.5 mg total) by mouth daily.   metoprolol succinate (TOPROL XL) 25 MG 24 hr tablet Take 0.5 tablets (12.5 mg total) by mouth daily.   Multiple Vitamin (MULTIVITAMIN) tablet Take 1 tablet by mouth daily.   OVER THE COUNTER MEDICATION Place 1 drop into both eyes every 8 (eight) hours as needed (dry eyes.). Lubricant eye drops   Polyethylene Glycol 3350 (MIRALAX PO) Take 17 g by mouth every 3 (three) days. Every 3 days   potassium chloride SA (KLOR-CON M) 20 MEQ tablet Take 20 mEq by mouth. Takes occasionally   rivaroxaban (XARELTO) 20 MG TABS tablet Take 1 tablet (20 mg total) by mouth daily with supper.   Facility-Administered Encounter Medications as of 05/14/2022  Medication   sodium chloride flush (NS) 0.9 % injection 3 mL     Lab Results  Component Value Date   WBC 5.8 05/14/2022  HGB 13.6 05/14/2022   HCT 40.1 05/14/2022   PLT 204.0 05/14/2022   GLUCOSE 86 05/14/2022   CHOL 221 (H) 05/14/2022   TRIG 81.0 05/14/2022   HDL 83.30 05/14/2022   LDLDIRECT 104.0 08/12/2017   LDLCALC 122 (H) 05/14/2022   ALT 26 05/14/2022   AST 25 05/14/2022   NA 138 05/14/2022   K 4.4 05/14/2022   CL 101 05/14/2022   CREATININE 0.57 05/14/2022   BUN 16 05/14/2022   CO2 32 05/14/2022   TSH 1.48 05/14/2022   INR 1.2 03/20/2021    MM 3D SCREEN BREAST  BILATERAL  Result Date: 12/26/2021 CLINICAL DATA:  Screening. EXAM: DIGITAL SCREENING BILATERAL MAMMOGRAM WITH TOMOSYNTHESIS AND CAD TECHNIQUE: Bilateral screening digital craniocaudal and mediolateral oblique mammograms were obtained. Bilateral screening digital breast tomosynthesis was performed. The images were evaluated with computer-aided detection. COMPARISON:  Previous exam(s). ACR Breast Density Category b: There are scattered areas of fibroglandular density. FINDINGS: There are no findings suspicious for malignancy. IMPRESSION: No mammographic evidence of malignancy. A result letter of this screening mammogram will be mailed directly to the patient. RECOMMENDATION: Screening mammogram in one year. (Code:SM-B-01Y) BI-RADS CATEGORY  1: Negative. Electronically Signed   By: Fidela Salisbury M.D.   On: 12/26/2021 16:42       Assessment & Plan:   Problem List Items Addressed This Visit     (HFpEF) heart failure with preserved ejection fraction (HCC)    Breathing overall improved.   No evidence of volume overload today on exam. On losartan 12.'5mg'$  q day.  Follow.  Follow  metabolic panel.       A-fib ([place])    Continues on metoprolol and xarelto.  Stable. Follow.       Relevant Orders   Basic metabolic panel (Completed)   Ascending aorta dilatation (HCC)     Continue adequate blood pressure control.  Follow.       Elevated bilirubin    Recent ultrasound - no liver or gallbladder (or ductal) abnormality.  Follow liver panel.       Hypercholesterolemia - Primary    Low cholesterol diet and exercise.  Have discussed calculated cholesterol risk and recommendation to start cholesterol medication.  She declines.  Follow lipid panel.        Relevant Orders   TSH (Completed)   Hepatic function panel (Completed)   Lipid panel (Completed)   Lumbar spinal stenosis    Has known spinal stenosis.  Has seen Dr Sharlet Salina.  S/p injections.  Stable.        Thrombocytopenia ([place])    Abdominal  ultrasound revealed spleen size - wnl.  hgb wnl.  Follow cbc.       Relevant Orders   CBC with Differential/Platelet (Completed)      Einar Pheasant, MD

## 2022-05-28 ENCOUNTER — Encounter: Payer: Self-pay | Admitting: Internal Medicine

## 2022-05-28 NOTE — Assessment & Plan Note (Signed)
Breathing overall improved.   No evidence of volume overload today on exam. On losartan 12.'5mg'$  q day.  Follow.  Follow  metabolic panel.

## 2022-05-28 NOTE — Assessment & Plan Note (Signed)
Has known spinal stenosis.  Has seen Dr Sharlet Salina.  S/p injections.  Stable.

## 2022-05-28 NOTE — Assessment & Plan Note (Signed)
Abdominal ultrasound revealed spleen size - wnl.  hgb wnl.  Follow cbc.

## 2022-05-28 NOTE — Assessment & Plan Note (Signed)
Continues on metoprolol and xarelto.  Stable. Follow.

## 2022-05-28 NOTE — Assessment & Plan Note (Signed)
Recent ultrasound - no liver or gallbladder (or ductal) abnormality.  Follow liver panel.

## 2022-05-28 NOTE — Assessment & Plan Note (Signed)
Continue adequate blood pressure control.  Follow.

## 2022-05-28 NOTE — Assessment & Plan Note (Signed)
Low cholesterol diet and exercise.  Have discussed calculated cholesterol risk and recommendation to start cholesterol medication.  She declines.  Follow lipid panel.

## 2022-06-11 DIAGNOSIS — M955 Acquired deformity of pelvis: Secondary | ICD-10-CM | POA: Diagnosis not present

## 2022-06-11 DIAGNOSIS — M9903 Segmental and somatic dysfunction of lumbar region: Secondary | ICD-10-CM | POA: Diagnosis not present

## 2022-06-11 DIAGNOSIS — M5136 Other intervertebral disc degeneration, lumbar region: Secondary | ICD-10-CM | POA: Diagnosis not present

## 2022-06-11 DIAGNOSIS — M9905 Segmental and somatic dysfunction of pelvic region: Secondary | ICD-10-CM | POA: Diagnosis not present

## 2022-06-16 NOTE — Progress Notes (Unsigned)
Cardiology Office Note    Date:  06/17/2022   ID:  Deborah Baxter, DOB Oct 19, 1933, MRN [id number]  PCP:  Einar Pheasant, MD  Cardiologist:  Nelva Bush, MD  Electrophysiologist:  None   Chief Complaint: Follow-up  History of Present Illness:   Deborah Baxter is a 89 y.o. 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   Prior ED evaluation for syncope in 11/2020 with negative orthostatics and largely unrevealing labs.  Cardiac enzymes not cycled.  EKG unavailable for review.   She was admitted to [facility] fin 01/2021 with acute HFpEF, A. fib with RVR of uncertain chronicity (initially noted to be in A-fib in 03/2021), and moderate pericardial effusion.  EKG demonstrated A. fib with RVR, 121 bpm, low voltage QRS, and nonspecific ST-T changes.  High-sensitivity troponin of 28 with a delta of 17.  BNP 335.  Echo demonstrated a low normal LV systolic function with an EF of 50 to 55%, no regional wall motion abnormalities, moderate LVH, LV diastolic function parameters could not be evaluated, mildly reduced RV systolic function with normal ventricular cavity size, normal PASP, moderate biatrial enlargement, moderate pericardial effusion that was posterior to the LV without evidence of cardiac tamponade, degenerative mitral valve with moderate regurgitation and moderate to severe mitral annular calcification, moderate tricuspid regurgitation, tricuspid aortic valve with trivial insufficiency and mild aortic valve sclerosis without evidence of stenosis, mildly dilated ascending aorta measuring 41 mm, and an estimated right atrial pressure of 8 mmHg.  Given moderate LVH, biatrial enlargement, and low voltage on  EKG, infiltrative cardiomyopathy was recommended to be considered.  She responded well to IV diuresis.  With regards to her A. fib, she was rate controlled with beta-blocker and initiated on Xarelto.   She was seen in hospital follow-up on 03/30/2021 and was doing reasonably well without tachypalpitations and a noted improvement in her energy.  She still noted some exertional shortness of breath.  She reported adherence to medications and was without symptoms of bleeding.  She remained in A. fib with a ventricular rate of 94 bpm on EKG.  She was felt to be still mildly volume up with recommendation to briefly increase furosemide.  Subsequent repeat limited echo on 05/02/2021 demonstrated an EF of 50%, moderate LVH, normal RV systolic function and ventricular cavity size, stable moderate pericardial effusion that was posterior and lateral to the left ventricle without evidence of cardiac tamponade, moderate pleural effusion in the left lateral region, mild mitral regurgitation, trivial aortic insufficiency, mildly dilated ascending aorta measuring 41 mm, and an estimated right atrial pressure of 8 mmHg.   She was evaluated by her PCP in late 04/2021 with URI-like symptoms.  Lasix was again briefly increased.  Chest x-ray showed chronic scarring within the left lung base and no new focal abnormality.   She was seen in follow-up on 05/04/2021 and reported her breathing was largely stable, though did continue to note exertional dyspnea.  Her URI symptoms were improved.  She was without chest pain or palpitations.  Her weights had trended from 163 to 169 pounds.  She was taking furosemide 40 mg daily.  She remained in rate controlled A. fib and had not missed any doses of anticoagulation.  No bleeding concerns.  Her Lasix was again briefly  titrated.  She underwent successful DCCV without complication on 32/09/4008 following 1 synchronized cardioversion at 150 J with postprocedure EKG demonstrating NSR.   She was  seen in the on 05/18/2021 and was doing well from a cardiac perspective.  She had redeveloped A-fib with controlled ventricular response.  She was asymptomatic regarding this.  Her weight was down 6 pounds by our scale.  She was without symptoms of angina or decompensation.  Rate control strategy was pursued given age and she was asymptomatic in A-fib.  With regards to her possible infiltrative cardiomyopathy, patient and family preferred to defer further evaluation of this.  Repeat limited echo on 05/31/2021 demonstrated an EF of 60 to 65%, no regional wall motion abnormalities, severe concentric LVH, indeterminate LV diastolic function parameters, normal RV systolic function and ventricular cavity size, mildly elevated PASP estimated at 38.6 mmHg, moderate biatrial enlargement, moderate pericardial effusion posterior to the LV without evidence of tamponade, mild to moderate mitral regurgitation, moderate mitral annular calcification, moderate tricuspid regurgitation, mild aortic insufficiency, aortic valve sclerosis without evidence of stenosis, mildly dilated ascending aorta measuring 40 mm, and a rhythm of A-fib/flutter.   She was evaluated by her PCP in late 07/2021 with an increase in shortness of breath.  Lasix was briefly titrated.  Chest x-ray showed chronic left base scarring and possible chronic small pleural effusion without new consolidation.  There was also diffuse chronic interstitial coarsening.  Overall, findings were stable when compared to prior plain film.   She underwent repeat limited echo on 08/17/2021 which showed an EF of 55 to 60%, no regional wall motion abnormalities, severe LVH, indeterminate LV diastolic function parameters, normal RV systolic function and ventricular cavity size, moderately elevated PASP estimated at 50.3 mmHg, moderate biatrial enlargement, mild to moderate mitral regurgitation, moderate tricuspid regurgitation, and estimated right atrial pressure of 15 mmHg, and a  stable moderate pericardial effusion without evidence of tamponade.  Given these findings, it was recommended she increase her furosemide to 40 mg twice daily.   She was seen in the office on 08/22/2021, noting stable intermittent shortness of breath when ambulating to the mailbox.  She was without symptoms of angina or decompensation.  Her weight was stable.  Furosemide was reduced back to 40 mg daily.   She was seen in the office in 10/2020 and was without symptoms of angina or decompensation.  Her weight was down from 164 pounds to 138 pounds when compared to her visit in 08/2021.  This weight loss was unintentional.  There was also some concern regarding lower extremity hyperpigmentation that improved with leg elevation with noted normal pulses bilaterally.  She was also noted to have spontaneously converted to sinus rhythm with metoprolol being decreased to 25 mg twice daily secondary to relative hypotension.   She was evaluated by pulmonology on 11/02/2021 with recommendations for overnight pulse oximetry and to move forward with RHC.   She underwent RHC on 11/06/2021 which demonstrated normal left and right heart filling pressures with a mean RA pressure of 5 mmHg and a PCWP of 15 mmHg.  Borderline pulmonary hypertension with a mean PA pressure of 21 mmHg.  Normal cardiac output and index.     She was seen in the office on 11/15/2021 and was without symptoms of angina or decompensation.  She noted no significant dyspnea.     Cardiac MRI on 12/12/2021 demonstrated an EF of 59%, global transmural LGE in a heterogeneous pattern of the LV myocardium, mild circumferential pericardial effusion, moderate asymmetric  LVH, and a severely dilated left atrium.  Findings were consistent with an infiltrative cardiomyopathy such as cardiac amyloidosis.  She was last seen in the office on 12/25/2021 and was without symptoms of angina or decompensation.  She felt better than she had at her prior several visits. She was  transitioned from Lopressor to Toprol XL.  Lasix was decreased based on labs obtained at that time.    She comes in today accompanied by one of her daughters and is doing well from a cardiac perspective, without symptoms of angina or decompensation.  Since her last visit, her dyspnea and functional status have continued to improve.  She is pleased with her progress.  No dizziness, presyncope, or syncope.  Home BP readings are largely in the 90s to low 413K systolic with a rare reading in the 44W systolic.  Lower extremity swelling or progressive orthopnea.  Her weight remains stable.  She does not have any acute cardiac concerns at this time.   Labs independently reviewed: 05/2022 - potassium 4.4, BUN 16, SCr 0.57, TC 221, TG 81, HDL 83, LDL 122, albumin 4.2, AST/ALT normal, Hgb 13.6, PLT 204, TSH normal  Past Medical History:  Diagnosis Date   (HFpEF) heart failure with preserved ejection fraction ([place]) 04/24/2021   Pulmonary raised concern - question of pulmonary hypertension secondary to DD.  Question of need for right heart cath   A-fib [facility]) 04/24/2021   Acute heart failure with preserved ejection fraction (HCC)    Ascending aorta dilatation ([place]) 08/01/2021   Atrial fibrillation with rapid ventricular response ([place]) 03/20/2021   Congestion of nasal sinus 10/13/2021   Cough 04/23/2021   Diverticulosis 09/05/2012   Colonoscopy 08/31/12 - redundant colon and diverticulosis in the sigmoid colon, descending colon, transverse colon and hepatic flexure.  Recommend f/u colonoscopy in 10 years.    Elevated bilirubin 09/13/2021   Elevated blood pressure reading 12/05/2020   Health care maintenance 07/13/2014   Physical 07/12/14.  Colonoscopy 08/31/12.  Normal bone density 04/06/10.  Mammogram 05/30/20- Birads I.     Hx of degenerative disc disease    Hypercholesterolemia    Hypertensive urgency 03/20/2021   Ingrown toenail 09/13/2021   Left hand pain 03/20/2021   Low back pain 01/29/2021   Lumbago  07/13/2014   Lumbar spinal stenosis 06/27/2012   Neuromuscular disorder (HCC)    pinched nerve   Numbness and tingling in left hand 08/21/2017   Osteoarthritis    Pleural effusion 09/13/2021   Pulmonary edema 03/20/2021   Seasonal allergies    Spinal stenosis    chronic back and leg pain   Stress 02/03/2021   Syncope 12/05/2020   Thrombocytopenia ([place]) 09/13/2021    Past Surgical History:  Procedure Laterality Date   APPENDECTOMY     bladder tack surgery  2000   BTL  1976   CARDIOVERSION N/A 05/11/2021   Procedure: CARDIOVERSION;  Surgeon: Minna Merritts, MD;  Location: [facility] ORS;  Service: Cardiovascular;  Laterality: N/A;   CARPAL TUNNEL RELEASE     CATARACT EXTRACTION W/PHACO Left 12/07/2015   Procedure: CATARACT EXTRACTION PHACO AND INTRAOCULAR LENS PLACEMENT ([place]);  Surgeon: Eulogio Bear, MD;  Location: [facility] ORS;  Service: Ophthalmology;  Laterality: Left;  [place] 1.23 AP% 23.2 CDE 19.28 Fluid Pack Lot # [id number]   EYE SURGERY  03/19/15   GANGLION CYST EXCISION     right hand   GAS/FLUID EXCHANGE Left 03/08/2015   Procedure: GAS/FLUID EXCHANGE;  Surgeon: Milus Height, MD;  Location: [facility]  ORS;  Service: Ophthalmology;  Laterality: Left;   RIGHT HEART CATH N/A 11/06/2021   Procedure: RIGHT HEART CATH;  Surgeon: Nelva Bush, MD;  Location: [place] CV LAB;  Service: Cardiovascular;  Laterality: N/A;   TONSILLECTOMY     TUBAL LIGATION     VITRECTOMY 25 GAUGE WITH SCLERAL BUCKLE Left 03/08/2015   Procedure: VITRECTOMY 25 GAUGE, membrane peel, 26 %  SF6 gas exchange;  Surgeon: Milus Height, MD;  Location: [facility] ORS;  Service: Ophthalmology;  Laterality: Left;  casette lot # [id number]    Current Medications: Current Meds  Medication Sig   acetaminophen (TYLENOL) 650 MG CR tablet Take 650 mg by mouth every 8 (eight) hours as needed for pain. Reported on 08/02/2015   furosemide (LASIX) 40 MG tablet Take 0.5 tablets (20 mg total) by mouth daily.   metoprolol succinate  (TOPROL XL) 25 MG 24 hr tablet Take 0.5 tablets (12.5 mg total) by mouth daily.   Multiple Vitamin (MULTIVITAMIN) tablet Take 1 tablet by mouth daily.   OVER THE COUNTER MEDICATION Place 1 drop into both eyes every 8 (eight) hours as needed (dry eyes.). Lubricant eye drops   Polyethylene Glycol 3350 (MIRALAX PO) Take 17 g by mouth every 3 (three) days. Every 3 days   potassium chloride SA (KLOR-CON M) 20 MEQ tablet Take 20 mEq by mouth. Takes occasionally   [DISCONTINUED] losartan (COZAAR) 25 MG tablet Take 0.5 tablets (12.5 mg total) by mouth daily.   Current Facility-Administered Medications for the 06/17/22 encounter (Office Visit) with Rise Mu, PA-C  Medication   sodium chloride flush (NS) 0.9 % injection 3 mL    Allergies:   Quinolones   Social History   Socioeconomic History   Marital status: Widowed    Spouse name: Not on file   Number of children: 5   Years of education: Not on file   Highest education level: Not on file  Occupational History   Not on file  Tobacco Use   Smoking status: Former    Packs/day: 0.25    Years: 10.00    Total pack years: 2.50    Types: Cigarettes    Quit date: 62    Years since quitting: 51.0   Smokeless tobacco: Never  Vaping Use   Vaping Use: Never used  Substance and Sexual Activity   Alcohol use: Yes    Alcohol/week: 0.0 standard drinks of alcohol    Comment: occassional   Drug use: No   Sexual activity: Not on file  Other Topics Concern   Not on file  Social History Narrative   Not on file   Social Determinants of Health   Financial Resource Strain: Low Risk  (08/20/2021)   Overall Financial Resource Strain (CARDIA)    Difficulty of Paying Living Expenses: Not hard at all  Food Insecurity: No Food Insecurity (08/20/2021)   Hunger Vital Sign    Worried About Running Out of Food in the Last Year: Never true    Ran Out of Food in the Last Year: Never true  Transportation Needs: No Transportation Needs (08/20/2021)    PRAPARE - Hydrologist (Medical): No    Lack of Transportation (Non-Medical): No  Physical Activity: Insufficiently Active (08/17/2020)   Exercise Vital Sign    Days of Exercise per Week: 3 days    Minutes of Exercise per Session: 20 min  Stress: No Stress Concern Present (08/20/2021)   [place]  Questionnaire    Feeling of Stress : Not at all  Social Connections: Unknown (08/20/2021)   Social Connection and Isolation Panel [NHANES]    Frequency of Communication with Friends and Family: More than three times a week    Frequency of Social Gatherings with Friends and Family: More than three times a week    Attends Religious Services: More than 4 times per year    Active Member of [employer] or Organizations: Not on file    Attends Archivist Meetings: Not on file    Marital Status: Widowed     Family History:  The patient's family history includes Breast cancer in her maternal aunt and sister; Cancer in her sister; Diabetes in her mother; Heart disease in her father; Leukemia in her father; Thyroid cancer in her sister. There is no history of Colon cancer.  ROS:   12-point review of systems is negative unless otherwise noted in the HPI.   EKGs/Labs/Other Studies Reviewed:    Studies reviewed were summarized above. The additional studies were reviewed today:  2D echo 03/20/2021: 1. Left ventricular ejection fraction, by estimation, is 50 to 55%. The  left ventricle has low normal function. The left ventricle has no regional  wall motion abnormalities. There is moderate left ventricular hypertrophy.  Left ventricular diastolic  function could not be evaluated.   2. Right ventricular systolic function is mildly reduced. The right  ventricular size is normal. There is normal pulmonary artery systolic  pressure.   3. Left atrial size was moderately dilated.   4. Right atrial size was moderately  dilated.   5. Moderate pericardial effusion. The pericardial effusion is posterior  to the left ventricle. There is no evidence of cardiac tamponade.   6. The mitral valve is degenerative. Moderate mitral valve regurgitation.  Moderate to severe mitral annular calcification.   7. Tricuspid valve is mildly thickened. The tricuspid valve is abnormal.  Tricuspid valve regurgitation is moderate.   8. The aortic valve is tricuspid. There is mild thickening of the aortic  valve. Aortic valve regurgitation is trivial. Mild aortic valve sclerosis  is present, with no evidence of aortic valve stenosis.   9. There is mild dilatation of the ascending aorta, measuring 41 mm.  10. The inferior vena cava is normal in size with <50% respiratory  variability, suggesting right atrial pressure of 8 mmHg.   Conclusion(s)/Recommendation(s): Given moderate LVH, biatrial enlargement,  and low voltage on EKG, infiltrative cardiomyopathy, including  amyloidosis, should be considered.  __________   Limited echo 05/02/2021: 1. Left ventricular ejection fraction, by estimation, is 50%. There is  moderate left ventricular hypertrophy.   2. Right ventricular systolic function is normal. The right ventricular  size is normal.   3. Moderate pericardial effusion. The pericardial effusion is posterior  and lateral to the left ventricle. There is no evidence of cardiac  tamponade. Moderate pleural effusion in the left lateral region.   4. Mild mitral valve regurgitation.   5. The aortic valve is tricuspid. Aortic valve regurgitation is trivial.   6. Aortic dilatation noted. There is mild dilatation of the ascending  aorta, measuring 41 mm.   7. The inferior vena cava is normal in size with <50% respiratory  variability, suggesting right atrial pressure of 8 mmHg.  __________   Limited echo 05/31/2021: 1. Left ventricular ejection fraction, by estimation, is 60 to 65%. The  left ventricle has normal function. The  left ventricle has no regional  wall motion abnormalities.  There is severe concentric left ventricular  hypertrophy. Left ventricular diastolic   parameters are indeterminate.   2. Right ventricular systolic function is normal. The right ventricular  size is normal. There is mildly elevated pulmonary artery systolic  pressure. The estimated right ventricular systolic pressure is 44.0 mmHg.   3. Left atrial size was moderately dilated.   4. Right atrial size was moderately dilated.   5. Moderate pericardial effusion in the posterior LV region, estimated  1.69 -2.00 cm, otherwise mild circumferential effusion No tamponade.   6. The mitral valve is normal in structure. Mild to moderate mitral valve  regurgitation. No evidence of mitral stenosis. Moderate mitral annular  calcification.   7. Tricuspid valve regurgitation is moderate.   8. The aortic valve is normal in structure. Aortic valve regurgitation is  mild. Aortic valve sclerosis/calcification is present, without any  evidence of aortic stenosis.   9. The inferior vena cava is normal in size with greater than 50%  respiratory variability, suggesting right atrial pressure of 3 mmHg.  10. There is mild dilatation of the ascending aorta, measuring 40 mm.  11. Rhythm apppears to be atrial fib/flutter. __________   Limited echo 08/17/2021: 1. Left ventricular ejection fraction, by estimation, is 55 to 60%. The  left ventricle has normal function. The left ventricle has no regional  wall motion abnormalities. There is severe left ventricular hypertrophy.  Left ventricular diastolic parameters   are indeterminate.   2. Right ventricular systolic function is normal. The right ventricular  size is normal. There is moderately elevated pulmonary artery systolic  pressure. The estimated right ventricular systolic pressure is 34.7 mmHg.   3. Left atrial size was moderately dilated.   4. Right atrial size was moderately dilated.   5. The  mitral valve is normal in structure. Mild to moderate mitral valve  regurgitation. No evidence of mitral stenosis.   6. Tricuspid valve regurgitation is moderate.   7. The aortic valve is tricuspid. Aortic valve regurgitation is not  visualized. No aortic stenosis is present.   8. The inferior vena cava is dilated in size with <50% respiratory  variability, suggesting right atrial pressure of 15 mmHg.   9. Moderate pericardial effusion , estimated 1.3 to 2.6 cm off the LV  free wall, mild to moderate off the RV free wall (1.6 cm). No tamponade  noted. __________   RHC 11/06/2021: Conclusions: Normal left and right heart filling pressures (mean RAP 5 mmHg, PCWP 15 mmHg). Borderline pulmonary hypertension (mean PAP 21 mmHg). Normal Fick cardiac output/index.   Recommendations: Continue medical therapy to maintain net even fluid balance. __________   Cardiac MRI 12/12/2021: IMPRESSION: 1. Normal left and right ventricular function.  LVEF 59%. 2. There is global transmural late gadolinium enhancement (LGE) in a heterogeneous pattern in the left ventricular myocardium. 3. There is mild circumferential pericardial effusion. 4. Moderate asymmetric left ventricular hypertrophy. 5. Severely dilated left atrium. 6. Findings consistent with infiltrative cardiomyopathy such as cardiac amyloidosis.   EKG:  EKG is ordered today.  The EKG ordered today demonstrates NSR, 81 bpm, occasional PVCs, no acute ST-T changes  Recent Labs: 05/14/2022: ALT 26; BUN 16; Creatinine, Ser 0.57; Hemoglobin 13.6; Platelets 204.0; Potassium 4.4; Sodium 138; TSH 1.48  Recent Lipid Panel    Component Value Date/Time   CHOL 221 (H) 05/14/2022 1104   TRIG 81.0 05/14/2022 1104   HDL 83.30 05/14/2022 1104   CHOLHDL 3 05/14/2022 1104   VLDL 16.2 05/14/2022 1104   LDLCALC  122 (H) 05/14/2022 1104   LDLDIRECT 104.0 08/12/2017 0932    PHYSICAL EXAM:    VS:  BP 124/74 (BP Location: Left Arm, Patient Position:  Sitting, Cuff Size: Normal)   Pulse 81   Ht 5' 7.5" (1.715 m)   Wt 144 lb 9.6 oz (65.6 kg)   LMP 06/26/1985   SpO2 96%   BMI 22.31 kg/m   BMI: Body mass index is 22.31 kg/m.  Physical Exam Vitals reviewed.  Constitutional:      Appearance: She is well-developed.  HENT:     Head: Normocephalic and atraumatic.  Eyes:     General:        Right eye: No discharge.        Left eye: No discharge.  Neck:     Vascular: No JVD.  Cardiovascular:     Rate and Rhythm: Normal rate and regular rhythm.     Pulses:          Posterior tibial pulses are 2+ on the right side and 2+ on the left side.     Heart sounds: S1 normal and S2 normal. Heart sounds not distant. No midsystolic click and no opening snap. Murmur heard.     Systolic murmur is present with a grade of 1/6 at the upper right sternal border.     No friction rub.  Pulmonary:     Effort: Pulmonary effort is normal. No respiratory distress.     Breath sounds: Normal breath sounds. No decreased breath sounds, wheezing or rales.  Chest:     Chest wall: No tenderness.  Abdominal:     General: There is no distension.  Musculoskeletal:     Cervical back: Normal range of motion.     Right lower leg: No edema.     Left lower leg: No edema.  Skin:    General: Skin is warm and dry.     Nails: There is no clubbing.  Neurological:     Mental Status: She is alert and oriented to person, place, and time.  Psychiatric:        Speech: Speech normal.        Behavior: Behavior normal.        Thought Content: Thought content normal.        Judgment: Judgment normal.     Wt Readings from Last 3 Encounters:  06/17/22 144 lb 9.6 oz (65.6 kg)  05/14/22 139 lb (63 kg)  03/14/22 139 lb 3.2 oz (63.1 kg)     ASSESSMENT & PLAN:   HFpEF with infiltrative cardiomyopathy concerning for cardiac amyloidosis/pulmonary hypertension: She appears euvolemic and well compensated.  Prior cardiac MRI did demonstrate infiltrative cardiomyopathy  concerning for cardiac amyloidosis.  This diagnosis has been discussed in detail with patient and daughter with preference to pursue conservative therapy with focusing on quality.  Patient and family do not wish to pursue aggressive/invasive therapies or further testing.  Given her age, and in the context of her being asymptomatic, this is reasonable.  She has declined genetic testing.  RHC showed normal left and right heart filling pressures with borderline pulmonary hypertension.  Stop losartan given relative hypotension.  Continue Toprol-XL and furosemide.  Could consider addition of spironolactone and follow-up if BP does not precluded.  Persistent Afib: Maintaining sinus rhythm on Toprol-XL.  CHA2DS2-VASc at least 5.  She remains on Xarelto 20 mg daily with a creatinine clearance of 72.  No falls or symptoms concerning for bleeding.  Recent labs stable.  Pericardial  effusion: Stable on recent echo and cardiac MRI without any evidence of tamponade physiology.  Hemodynamically stable.  She remains on furosemide.  Aortic insufficiency/mitral regurgitation: Asymptomatic with periodic echo.  HTN: Blood pressure is well-controlled in the office today.  Home BP readings have been soft at times.  In this setting, stop losartan with continuation of Toprol-XL.  Mildly dilated ascending aorta: Most recent echo demonstrated normal size and structure and aorta.   Disposition: F/u with Dr. Saunders Revel or an APP in 6 months.   Medication Adjustments/Labs and Tests Ordered: Current medicines are reviewed at length with the patient today.  Concerns regarding medicines are outlined above. Medication changes, Labs and Tests ordered today are summarized above and listed in the Patient Instructions accessible in Encounters.   Signed, Christell Faith, PA-C 06/17/2022 1:31 PM     [place] [place] [place], [place] [address] [phone]

## 2022-06-17 ENCOUNTER — Telehealth: Payer: Self-pay | Admitting: Internal Medicine

## 2022-06-17 ENCOUNTER — Encounter: Payer: Self-pay | Admitting: Physician Assistant

## 2022-06-17 ENCOUNTER — Ambulatory Visit: Payer: Medicare Other | Attending: Physician Assistant | Admitting: Physician Assistant

## 2022-06-17 VITALS — BP 124/74 | HR 81 | Ht 67.5 in | Wt 144.6 lb

## 2022-06-17 DIAGNOSIS — I3139 Other pericardial effusion (noninflammatory): Secondary | ICD-10-CM

## 2022-06-17 DIAGNOSIS — I4819 Other persistent atrial fibrillation: Secondary | ICD-10-CM | POA: Diagnosis not present

## 2022-06-17 DIAGNOSIS — I428 Other cardiomyopathies: Secondary | ICD-10-CM

## 2022-06-17 DIAGNOSIS — I340 Nonrheumatic mitral (valve) insufficiency: Secondary | ICD-10-CM

## 2022-06-17 DIAGNOSIS — I5032 Chronic diastolic (congestive) heart failure: Secondary | ICD-10-CM | POA: Diagnosis not present

## 2022-06-17 DIAGNOSIS — I10 Essential (primary) hypertension: Secondary | ICD-10-CM | POA: Diagnosis not present

## 2022-06-17 DIAGNOSIS — I77810 Thoracic aortic ectasia: Secondary | ICD-10-CM | POA: Diagnosis not present

## 2022-06-17 DIAGNOSIS — I351 Nonrheumatic aortic (valve) insufficiency: Secondary | ICD-10-CM

## 2022-06-17 DIAGNOSIS — I272 Pulmonary hypertension, unspecified: Secondary | ICD-10-CM

## 2022-06-17 MED ORDER — RIVAROXABAN 20 MG PO TABS: 20.0000 mg | ORAL_TABLET | Freq: Every day | ORAL | 3 refills | Status: DC

## 2022-06-17 NOTE — Patient Instructions (Signed)
Medication Instructions:  Your physician has recommended you make the following change in your medication:   HOLD Losartan  *If you need a refill on your cardiac medications before your next appointment, please call your pharmacy*   Lab Work: None  If you have labs (blood work) drawn today and your tests are completely normal, you will receive your results only by: [place] (if you have MyChart) OR A paper copy in the mail If you have any lab test that is abnormal or we need to change your treatment, we will call you to review the results.   Testing/Procedures: None   Follow-Up: At [facility], you and your health needs are our priority.  As part of our continuing mission to provide you with exceptional heart care, we have created designated Provider Care Teams.  These Care Teams include your primary Cardiologist (physician) and Advanced Practice Providers (APPs -  Physician Assistants and Nurse Practitioners) who all work together to provide you with the care you need, when you need it.   Your next appointment:   6 month(s)  Provider:   Nelva Bush, MD or Christell Faith, PA-C

## 2022-06-17 NOTE — Telephone Encounter (Signed)
Patient is calling stating she is returning a call she received from our office today. I was unable to find documentation as to what the call was in regards to.

## 2022-06-17 NOTE — Telephone Encounter (Signed)
Reviewed chart and do not see any notes where someone had called her. She was here today for appointment and no orders were placed. Spoke with patient and she could not remember who it was that called. Advised that if she should have any questions to let us know.

## 2022-06-19 NOTE — Addendum Note (Signed)
Addended by: James Ivanoff D on: 06/19/2022 11:06 AM   Modules accepted: Orders

## 2022-07-01 ENCOUNTER — Ambulatory Visit: Payer: Medicare Other | Admitting: Podiatry

## 2022-07-04 ENCOUNTER — Ambulatory Visit: Payer: Medicare Other | Admitting: Podiatry

## 2022-07-09 DIAGNOSIS — M9903 Segmental and somatic dysfunction of lumbar region: Secondary | ICD-10-CM | POA: Diagnosis not present

## 2022-07-09 DIAGNOSIS — M9905 Segmental and somatic dysfunction of pelvic region: Secondary | ICD-10-CM | POA: Diagnosis not present

## 2022-07-09 DIAGNOSIS — M955 Acquired deformity of pelvis: Secondary | ICD-10-CM | POA: Diagnosis not present

## 2022-07-09 DIAGNOSIS — M5136 Other intervertebral disc degeneration, lumbar region: Secondary | ICD-10-CM | POA: Diagnosis not present

## 2022-07-12 ENCOUNTER — Ambulatory Visit (INDEPENDENT_AMBULATORY_CARE_PROVIDER_SITE_OTHER): Payer: Medicare Other | Admitting: Podiatry

## 2022-07-12 DIAGNOSIS — M79674 Pain in right toe(s): Secondary | ICD-10-CM

## 2022-07-12 DIAGNOSIS — B351 Tinea unguium: Secondary | ICD-10-CM

## 2022-07-12 DIAGNOSIS — M79675 Pain in left toe(s): Secondary | ICD-10-CM

## 2022-07-12 NOTE — Progress Notes (Signed)
  Subjective:  Patient ID: Deborah Baxter, female    DOB: Nov 26, 1933,  MRN: [id number]  No chief complaint on file.  89 y.o. female returns for the above complaint.  Patient presents with thickened elongated dystrophic toenails x10.  Mild pain on palpation.  Patient states that she is not able to do it herself.  She would like for me to debride down.  She denies any other acute complaints  Objective:  There were no vitals filed for this visit. Podiatric Exam: Vascular: dorsalis pedis and posterior tibial pulses are palpable bilateral. Capillary return is immediate. Temperature gradient is WNL. Skin turgor WNL  Sensorium: Normal Semmes Weinstein monofilament test. Normal tactile sensation bilaterally. Nail Exam: Pt has thick disfigured discolored nails with subungual debris noted bilateral entire nail hallux through fifth toenails.  Pain on palpation to the nails. Ulcer Exam: There is no evidence of ulcer or pre-ulcerative changes or infection. Orthopedic Exam: Muscle tone and strength are WNL. No limitations in general ROM. No crepitus or effusions noted.  Skin: No Porokeratosis. No infection or ulcers    Assessment & Plan:   No diagnosis found.     Patient was evaluated and treated and all questions answered.  Onychomycosis with pain  -Nails palliatively debrided as below. -Educated on self-care  Procedure: Nail Debridement Rationale: pain  Type of Debridement: manual, sharp debridement. Instrumentation: Nail nipper, rotary burr. Number of Nails: 10  Procedures and Treatment: Consent by patient was obtained for treatment procedures. The patient understood the discussion of treatment and procedures well. All questions were answered thoroughly reviewed. Debridement of mycotic and hypertrophic toenails, 1 through 5 bilateral and clearing of subungual debris. No ulceration, no infection noted.  Return Visit-Office Procedure: Patient instructed to return to the office for a follow up  visit 3 months for continued evaluation and treatment.  Boneta Lucks, DPM    No follow-ups on file.

## 2022-08-13 DIAGNOSIS — M9905 Segmental and somatic dysfunction of pelvic region: Secondary | ICD-10-CM | POA: Diagnosis not present

## 2022-08-13 DIAGNOSIS — M5136 Other intervertebral disc degeneration, lumbar region: Secondary | ICD-10-CM | POA: Diagnosis not present

## 2022-08-13 DIAGNOSIS — M955 Acquired deformity of pelvis: Secondary | ICD-10-CM | POA: Diagnosis not present

## 2022-08-13 DIAGNOSIS — M9903 Segmental and somatic dysfunction of lumbar region: Secondary | ICD-10-CM | POA: Diagnosis not present

## 2022-08-14 ENCOUNTER — Encounter: Payer: Self-pay | Admitting: Internal Medicine

## 2022-08-14 ENCOUNTER — Ambulatory Visit (INDEPENDENT_AMBULATORY_CARE_PROVIDER_SITE_OTHER): Payer: Medicare Other | Admitting: Internal Medicine

## 2022-08-14 VITALS — BP 122/78 | HR 78 | Temp 97.8°F | Resp 16 | Ht 67.5 in | Wt 145.6 lb

## 2022-08-14 DIAGNOSIS — I4891 Unspecified atrial fibrillation: Secondary | ICD-10-CM | POA: Diagnosis not present

## 2022-08-14 DIAGNOSIS — M48061 Spinal stenosis, lumbar region without neurogenic claudication: Secondary | ICD-10-CM

## 2022-08-14 DIAGNOSIS — I5030 Unspecified diastolic (congestive) heart failure: Secondary | ICD-10-CM | POA: Diagnosis not present

## 2022-08-14 DIAGNOSIS — I77810 Thoracic aortic ectasia: Secondary | ICD-10-CM | POA: Diagnosis not present

## 2022-08-14 DIAGNOSIS — M5441 Lumbago with sciatica, right side: Secondary | ICD-10-CM

## 2022-08-14 DIAGNOSIS — D696 Thrombocytopenia, unspecified: Secondary | ICD-10-CM | POA: Diagnosis not present

## 2022-08-14 DIAGNOSIS — F439 Reaction to severe stress, unspecified: Secondary | ICD-10-CM

## 2022-08-14 DIAGNOSIS — M79602 Pain in left arm: Secondary | ICD-10-CM | POA: Diagnosis not present

## 2022-08-14 DIAGNOSIS — E7800 Pure hypercholesterolemia, unspecified: Secondary | ICD-10-CM | POA: Diagnosis not present

## 2022-08-14 DIAGNOSIS — R17 Unspecified jaundice: Secondary | ICD-10-CM | POA: Diagnosis not present

## 2022-08-14 LAB — LIPID PANEL
Cholesterol: 197 mg/dL (ref 0–200)
HDL: 70.3 mg/dL (ref >39.00)
LDL Cholesterol: 109 mg/dL — ABNORMAL HIGH (ref 0–99)
NonHDL: 126.55
Total CHOL/HDL Ratio: 3
Triglycerides: 87 mg/dL (ref 0.0–149.0)
VLDL: 17.4 mg/dL (ref 0.0–40.0)

## 2022-08-14 LAB — HEPATIC FUNCTION PANEL
ALT: 35 U/L (ref 0–35)
AST: 26 U/L (ref 0–37)
Albumin: 3.8 g/dL (ref 3.5–5.2)
Alkaline Phosphatase: 86 U/L (ref 39–117)
Bilirubin, Direct: 0.2 mg/dL (ref 0.0–0.3)
Total Bilirubin: 0.7 mg/dL (ref 0.2–1.2)
Total Protein: 6.2 g/dL (ref 6.0–8.3)

## 2022-08-14 LAB — BASIC METABOLIC PANEL
BUN: 15 mg/dL (ref 6–23)
CO2: 32 mEq/L (ref 19–32)
Calcium: 9.6 mg/dL (ref 8.4–10.5)
Chloride: 102 mEq/L (ref 96–112)
Creatinine, Ser: 0.62 mg/dL (ref 0.40–1.20)
GFR: 80.56 mL/min (ref >60.00)
Glucose, Bld: 88 mg/dL (ref 70–99)
Potassium: 4.8 mEq/L (ref 3.5–5.1)
Sodium: 138 mEq/L (ref 135–145)

## 2022-08-14 NOTE — Progress Notes (Signed)
Subjective:    Patient ID: Deborah Baxter, female    DOB: [birth year], 89 y.o.   MRN: [id number]  Patient here for  Chief Complaint  Patient presents with   Medical Management of Chronic Issues    HPI Follow up regarding afib, HFpEF, hypertension and hypercholesterolemia. She is accompanied by her daughter.  History obtained from both of them. Reports she is doing well. Breathing stable. No increased sob. Has been monitoring weight and blood pressure.  Reviewed outside checks.  Stable.  Blood pressure was running mostly [id number] systolic readings when she saw Dr Saunders Revel 06/17/22.  He stopped her losartan.  Recommended continuing toprol and lasix.  She does report noticing since first of March - some left arm tenderness - to palpation.  No pain currently.  No known injury or trauma.  Some right lateral ankle pain.  Desires no further evaluation at this time.  Reports breathing is stable.  Overall feels things are stable.  No dizziness or light headedness.     Past Medical History:  Diagnosis Date   (HFpEF) heart failure with preserved ejection fraction ([place]) 04/24/2021   Pulmonary raised concern - question of pulmonary hypertension secondary to DD.  Question of need for right heart cath   A-fib [facility]) 04/24/2021   Acute heart failure with preserved ejection fraction (HCC)    Ascending aorta dilatation ([place]) 08/01/2021   Atrial fibrillation with rapid ventricular response ([place]) 03/20/2021   Congestion of nasal sinus 10/13/2021   Cough 04/23/2021   Diverticulosis 09/05/2012   Colonoscopy 08/31/12 - redundant colon and diverticulosis in the sigmoid colon, descending colon, transverse colon and hepatic flexure.  Recommend f/u colonoscopy in 10 years.    Elevated bilirubin 09/13/2021   Elevated blood pressure reading 12/05/2020   Health care maintenance 07/13/2014   Physical 07/12/14.  Colonoscopy 08/31/12.  Normal bone density 04/06/10.  Mammogram 05/30/20- Birads I.     Hx of degenerative disc disease     Hypercholesterolemia    Hypertensive urgency 03/20/2021   Ingrown toenail 09/13/2021   Left hand pain 03/20/2021   Low back pain 01/29/2021   Lumbago 07/13/2014   Lumbar spinal stenosis 06/27/2012   Neuromuscular disorder (HCC)    pinched nerve   Numbness and tingling in left hand 08/21/2017   Osteoarthritis    Pleural effusion 09/13/2021   Pulmonary edema 03/20/2021   Seasonal allergies    Spinal stenosis    chronic back and leg pain   Stress 02/03/2021   Syncope 12/05/2020   Thrombocytopenia ([place]) 09/13/2021   Past Surgical History:  Procedure Laterality Date   APPENDECTOMY     bladder tack surgery  2000   BTL  1976   CARDIOVERSION N/A 05/11/2021   Procedure: CARDIOVERSION;  Surgeon: Minna Merritts, MD;  Location: [facility] ORS;  Service: Cardiovascular;  Laterality: N/A;   CARPAL TUNNEL RELEASE     CATARACT EXTRACTION W/PHACO Left 12/07/2015   Procedure: CATARACT EXTRACTION PHACO AND INTRAOCULAR LENS PLACEMENT ([place]);  Surgeon: Eulogio Bear, MD;  Location: [facility] ORS;  Service: Ophthalmology;  Laterality: Left;  [place] 1.23 AP% 23.2 CDE 19.28 Fluid Pack Lot # [id number]   EYE SURGERY  03/19/15   GANGLION CYST EXCISION     right hand   GAS/FLUID EXCHANGE Left 03/08/2015   Procedure: GAS/FLUID EXCHANGE;  Surgeon: Milus Height, MD;  Location: [facility] ORS;  Service: Ophthalmology;  Laterality: Left;   RIGHT HEART CATH N/A 11/06/2021   Procedure: RIGHT HEART CATH;  Surgeon: Nelva Bush, MD;  Location: [facility] [facility];  Service: Cardiovascular;  Laterality: N/A;   TONSILLECTOMY     TUBAL LIGATION     VITRECTOMY 25 GAUGE WITH SCLERAL BUCKLE Left 03/08/2015   Procedure: VITRECTOMY 25 GAUGE, membrane peel, 26 %  SF6 gas exchange;  Surgeon: Milus Height, MD;  Location: [facility] ORS;  Service: Ophthalmology;  Laterality: Left;  casette lot # [id number]   Family History  Problem Relation Age of Onset   Diabetes Mother    Heart disease Father        Unknown age of onset. Unknown if had CHF or  heart attack.   Leukemia Father    Breast cancer Sister        in her 57's   Cancer Sister        lung cancer   Thyroid cancer Sister    Breast cancer Maternal Aunt    Colon cancer Neg Hx    Social History   Socioeconomic History   Marital status: Widowed    Spouse name: Not on file   Number of children: 5   Years of education: Not on file   Highest education level: Not on file  Occupational History   Not on file  Tobacco Use   Smoking status: Former    Packs/day: 0.25    Years: 10.00    Additional pack years: 0.00    Total pack years: 2.50    Types: Cigarettes    Quit date: 77    Years since quitting: 51.2   Smokeless tobacco: Never  Vaping Use   Vaping Use: Never used  Substance and Sexual Activity   Alcohol use: Yes    Alcohol/week: 0.0 standard drinks of alcohol    Comment: occassional   Drug use: No   Sexual activity: Not on file  Other Topics Concern   Not on file  Social History Narrative   Not on file   Social Determinants of Health   Financial Resource Strain: Low Risk  (08/20/2021)   Overall Financial Resource Strain (CARDIA)    Difficulty of Paying Living Expenses: Not hard at all  Food Insecurity: No Food Insecurity (08/20/2021)   Hunger Vital Sign    Worried About Running Out of Food in the Last Year: Never true    Ran Out of Food in the Last Year: Never true  Transportation Needs: No Transportation Needs (08/20/2021)   PRAPARE - Hydrologist (Medical): No    Lack of Transportation (Non-Medical): No  Physical Activity: Insufficiently Active (08/17/2020)   Exercise Vital Sign    Days of Exercise per Week: 3 days    Minutes of Exercise per Session: 20 min  Stress: No Stress Concern Present (08/20/2021)   [place]    Feeling of Stress : Not at all  Social Connections: Unknown (08/20/2021)   Social Connection and Isolation Panel [NHANES]    Frequency of  Communication with Friends and Family: More than three times a week    Frequency of Social Gatherings with Friends and Family: More than three times a week    Attends Religious Services: More than 4 times per year    Active Member of [employer] or Organizations: Not on file    Attends Archivist Meetings: Not on file    Marital Status: Widowed     Review of Systems  Constitutional:  Negative for appetite change and unexpected weight change.  HENT:  Negative for congestion  and sinus pressure.   Respiratory:  Negative for cough and chest tightness.        Breathing stable.   Cardiovascular:  Negative for chest pain and palpitations.  Gastrointestinal:  Negative for abdominal pain, constipation, diarrhea and vomiting.  Genitourinary:  Negative for difficulty urinating and dysuria.  Musculoskeletal:  Negative for joint swelling and myalgias.  Skin:  Negative for color change and rash.  Neurological:  Negative for dizziness and headaches.  Psychiatric/Behavioral:  Negative for agitation and dysphoric mood.        Objective:     BP 122/78   Pulse 78   Temp 97.8 F (36.6 C)   Resp 16   Ht 5' 7.5" (1.715 m)   Wt 145 lb 9.6 oz (66 kg)   LMP 06/26/1985   SpO2 98%   BMI 22.47 kg/m  Wt Readings from Last 3 Encounters:  08/14/22 145 lb 9.6 oz (66 kg)  06/17/22 144 lb 9.6 oz (65.6 kg)  05/14/22 139 lb (63 kg)    Physical Exam Vitals reviewed.  Constitutional:      General: She is not in acute distress.    Appearance: Normal appearance.  HENT:     Head: Normocephalic and atraumatic.     Right Ear: External ear normal.     Left Ear: External ear normal.  Eyes:     General: No scleral icterus.       Right eye: No discharge.        Left eye: No discharge.     Conjunctiva/sclera: Conjunctivae normal.  Neck:     Thyroid: No thyromegaly.  Cardiovascular:     Rate and Rhythm: Normal rate and regular rhythm.  Pulmonary:     Effort: No respiratory distress.     Breath  sounds: Normal breath sounds. No wheezing.  Abdominal:     General: Bowel sounds are normal.     Palpations: Abdomen is soft.     Tenderness: There is no abdominal tenderness.  Musculoskeletal:        General: No swelling or tenderness.     Cervical back: Neck supple. No tenderness.     Comments: No pain to palpation - left arm.   Lymphadenopathy:     Cervical: No cervical adenopathy.  Skin:    Findings: No erythema or rash.  Neurological:     Mental Status: She is alert.  Psychiatric:        Mood and Affect: Mood normal.        Behavior: Behavior normal.      Outpatient Encounter Medications as of 08/14/2022  Medication Sig   acetaminophen (TYLENOL) 650 MG CR tablet Take 650 mg by mouth every 8 (eight) hours as needed for pain. Reported on 08/02/2015   furosemide (LASIX) 40 MG tablet Take 0.5 tablets (20 mg total) by mouth daily.   metoprolol succinate (TOPROL XL) 25 MG 24 hr tablet Take 0.5 tablets (12.5 mg total) by mouth daily.   Multiple Vitamin (MULTIVITAMIN) tablet Take 1 tablet by mouth daily.   OVER THE COUNTER MEDICATION Place 1 drop into both eyes every 8 (eight) hours as needed (dry eyes.). Lubricant eye drops   Polyethylene Glycol 3350 (MIRALAX PO) Take 17 g by mouth every 3 (three) days. Every 3 days   potassium chloride SA (KLOR-CON M) 20 MEQ tablet Take 20 mEq by mouth. Takes occasionally   rivaroxaban (XARELTO) 20 MG TABS tablet Take 1 tablet (20 mg total) by mouth daily with supper.   Facility-Administered  Encounter Medications as of 08/14/2022  Medication   sodium chloride flush (NS) 0.9 % injection 3 mL     Lab Results  Component Value Date   WBC 5.8 05/14/2022   HGB 13.6 05/14/2022   HCT 40.1 05/14/2022   PLT 204.0 05/14/2022   GLUCOSE 88 08/14/2022   CHOL 197 08/14/2022   TRIG 87.0 08/14/2022   HDL 70.30 08/14/2022   LDLDIRECT 104.0 08/12/2017   LDLCALC 109 (H) 08/14/2022   ALT 35 08/14/2022   AST 26 08/14/2022   NA 138 08/14/2022   K 4.8  08/14/2022   CL 102 08/14/2022   CREATININE 0.62 08/14/2022   BUN 15 08/14/2022   CO2 32 08/14/2022   TSH 1.48 05/14/2022   INR 1.2 03/20/2021    MM 3D SCREEN BREAST BILATERAL  Result Date: 12/26/2021 CLINICAL DATA:  Screening. EXAM: DIGITAL SCREENING BILATERAL MAMMOGRAM WITH TOMOSYNTHESIS AND CAD TECHNIQUE: Bilateral screening digital craniocaudal and mediolateral oblique mammograms were obtained. Bilateral screening digital breast tomosynthesis was performed. The images were evaluated with computer-aided detection. COMPARISON:  Previous exam(s). ACR Breast Density Category b: There are scattered areas of fibroglandular density. FINDINGS: There are no findings suspicious for malignancy. IMPRESSION: No mammographic evidence of malignancy. A result letter of this screening mammogram will be mailed directly to the patient. RECOMMENDATION: Screening mammogram in one year. (Code:SM-B-01Y) BI-RADS CATEGORY  1: Negative. Electronically Signed   By: Fidela Salisbury M.D.   On: 12/26/2021 16:42       Assessment & Plan:  Hypercholesterolemia Assessment & Plan: Low cholesterol diet and exercise.  Have discussed calculated cholesterol risk and recommendation to start cholesterol medication.  She declines.  Follow lipid panel.    Orders: -     Hepatic function panel -     Basic metabolic panel -     Lipid panel  Atrial fibrillation, unspecified type [facility]) Assessment & Plan: Continues on metoprolol and xarelto.  Stable. Follow.  Cost is an issue.  Referral placed to medication management.   Orders: -     AMB Referral to Pharmacy Medication Management  Heart failure with preserved ejection fraction, unspecified HF chronicity ([place]) Assessment & Plan: Breathing overall improved.   No evidence of volume overload today on exam. On metoprolol and lasix.  Off losartan. Follow.  Follow  metabolic panel. Follow pressures.    Ascending aorta dilatation (HCC) Assessment & Plan:  Continue adequate  blood pressure control.  Follow.    Elevated bilirubin Assessment & Plan: Recent ultrasound - no liver or gallbladder (or ductal) abnormality.  Follow liver panel.    Right-sided low back pain with right-sided sciatica, unspecified chronicity Assessment & Plan: Continue f/u with physiatry - if needed.     Spinal stenosis of lumbar region, unspecified whether neurogenic claudication present Assessment & Plan: Has known spinal stenosis.  Has seen Dr Sharlet Salina.  S/p injections.  Follow.      Stress Assessment & Plan: Overall appears to be handling things relatively well.  Follow.    Thrombocytopenia ([place]) Assessment & Plan: Abdominal ultrasound revealed spleen size - wnl.  hgb wnl.  Follow cbc.    Pain of left upper extremity Assessment & Plan: Noticed recently - with palpation. No significant pain on exam.  Discussed further w/up and evaluation.  Wants to monitor.  Notify if persistent pain.       Einar Pheasant, MD

## 2022-08-15 ENCOUNTER — Telehealth: Payer: Self-pay

## 2022-08-15 NOTE — Progress Notes (Signed)
   Care Guide Note  08/15/2022 Name: Deborah Baxter MRN: [id number] DOB: 23-Jun-1933  Referred by: Einar Pheasant, MD Reason for referral : Care Coordination (Outreach to schedule with Pharm d )   Deborah Baxter is a 89 y.o. year old female who is a primary care patient of Einar Pheasant, MD. [name] was referred to the pharmacist for assistance related to Atrial Fibrillation.    Successful contact was made with the patient to discuss pharmacy services including being ready for the pharmacist to call at least 5 minutes before the scheduled appointment time, to have medication bottles and any blood sugar or blood pressure readings ready for review. The patient agreed to meet with the pharmacist via with the pharmacist via telephone visit on (date/time).  09/09/2022  [name], [place], [place] [address] Direct Dial: [phone] Reality [email or web address]

## 2022-08-18 ENCOUNTER — Encounter: Payer: Self-pay | Admitting: Internal Medicine

## 2022-08-18 DIAGNOSIS — M79603 Pain in arm, unspecified: Secondary | ICD-10-CM | POA: Insufficient documentation

## 2022-08-18 NOTE — Assessment & Plan Note (Signed)
Continue f/u with physiatry - if needed.

## 2022-08-18 NOTE — Assessment & Plan Note (Signed)
Noticed recently - with palpation. No significant pain on exam.  Discussed further w/up and evaluation.  Wants to monitor.  Notify if persistent pain.

## 2022-08-18 NOTE — Assessment & Plan Note (Signed)
Continue adequate blood pressure control.  Follow.  

## 2022-08-18 NOTE — Assessment & Plan Note (Signed)
Has known spinal stenosis.  Has seen Dr Sharlet Salina.  S/p injections.  Follow.

## 2022-08-18 NOTE — Assessment & Plan Note (Signed)
Low cholesterol diet and exercise.  Have discussed calculated cholesterol risk and recommendation to start cholesterol medication.  She declines.  Follow lipid panel.   

## 2022-08-18 NOTE — Assessment & Plan Note (Signed)
Abdominal ultrasound revealed spleen size - wnl.  hgb wnl.  Follow cbc.  

## 2022-08-18 NOTE — Assessment & Plan Note (Signed)
Overall appears to be handling things relatively well.  Follow.   

## 2022-08-18 NOTE — Assessment & Plan Note (Addendum)
Continues on metoprolol and xarelto.  Stable. Follow.  Cost is an issue.  Referral placed to medication management.

## 2022-08-18 NOTE — Assessment & Plan Note (Signed)
Recent ultrasound - no liver or gallbladder (or ductal) abnormality.  Follow liver panel.  

## 2022-08-18 NOTE — Assessment & Plan Note (Signed)
Breathing overall improved.   No evidence of volume overload today on exam. On metoprolol and lasix.  Off losartan. Follow.  Follow  metabolic panel. Follow pressures.

## 2022-08-27 ENCOUNTER — Telehealth: Payer: Self-pay | Admitting: Internal Medicine

## 2022-08-27 NOTE — Telephone Encounter (Signed)
Contacted Deborah Baxter to schedule their annual wellness visit. Appointment made for 09/02/2022.  Thank you,  [place] Direct dial  [phone]

## 2022-09-02 ENCOUNTER — Ambulatory Visit (INDEPENDENT_AMBULATORY_CARE_PROVIDER_SITE_OTHER): Payer: Medicare Other

## 2022-09-02 VITALS — Ht 67.5 in | Wt 145.0 lb

## 2022-09-02 DIAGNOSIS — Z0000 Encounter for general adult medical examination without abnormal findings: Secondary | ICD-10-CM

## 2022-09-02 DIAGNOSIS — Z1231 Encounter for screening mammogram for malignant neoplasm of breast: Secondary | ICD-10-CM | POA: Diagnosis not present

## 2022-09-02 NOTE — Patient Instructions (Addendum)
Deborah Baxter , Thank you for taking time to come for your Medicare Wellness Visit. I appreciate your ongoing commitment to your health goals. Please review the following plan we discussed and let me know if I can assist you in the future.   These are the goals we discussed:  Goals       Patient Stated     Maintain Healthy Lifestyle (pt-stated)      Stay hydrated Stay active Healthy diet; monitor salt intake        This is a list of the screening recommended for you and due dates:  Health Maintenance  Topic Date Due   COVID-19 Vaccine (4 - 2023-24 season) 09/18/2022*   Mammogram  12/26/2022   Flu Shot  01/02/2023   Medicare Annual Wellness Visit  09/02/2023   Pneumonia Vaccine  Completed   DEXA scan (bone density measurement)  Completed   Zoster (Shingles) Vaccine  Completed   HPV Vaccine  Aged Out   DTaP/Tdap/Td vaccine  Discontinued  *Topic was postponed. The date shown is not the original due date.    Advanced directives: not yet completed  Conditions/risks identified: none new  Next appointment: Follow up in one year for your annual wellness visit    Preventive Care 65 Years and Older, Female Preventive care refers to lifestyle choices and visits with your health care provider that can promote health and wellness. What does preventive care include? A yearly physical exam. This is also called an annual well check. Dental exams once or twice a year. Routine eye exams. Ask your health care provider how often you should have your eyes checked. Personal lifestyle choices, including: Daily care of your teeth and gums. Regular physical activity. Eating a healthy diet. Avoiding tobacco and drug use. Limiting alcohol use. Practicing safe sex. Taking low-dose aspirin every day. Taking vitamin and mineral supplements as recommended by your health care provider. What happens during an annual well check? The services and screenings done by your health care provider during  your annual well check will depend on your age, overall health, lifestyle risk factors, and family history of disease. Counseling  Your health care provider may ask you questions about your: Alcohol use. Tobacco use. Drug use. Emotional well-being. Home and relationship well-being. Sexual activity. Eating habits. History of falls. Memory and ability to understand (cognition). Work and work Statistician. Reproductive health. Screening  You may have the following tests or measurements: Height, weight, and BMI. Blood pressure. Lipid and cholesterol levels. These may be checked every 5 years, or more frequently if you are over 68 years old. Skin check. Lung cancer screening. You may have this screening every year starting at age 26 if you have a 30-pack-year history of smoking and currently smoke or have quit within the past 15 years. Fecal occult blood test (FOBT) of the stool. You may have this test every year starting at age 25. Flexible sigmoidoscopy or colonoscopy. You may have a sigmoidoscopy every 5 years or a colonoscopy every 10 years starting at age 70. Hepatitis C blood test. Hepatitis B blood test. Sexually transmitted disease (STD) testing. Diabetes screening. This is done by checking your blood sugar (glucose) after you have not eaten for a while (fasting). You may have this done every 1-3 years. Bone density scan. This is done to screen for osteoporosis. You may have this done starting at age 14. Mammogram. This may be done every 1-2 years. Talk to your health care provider about how often you should  have regular mammograms. Talk with your health care provider about your test results, treatment options, and if necessary, the need for more tests. Vaccines  Your health care provider may recommend certain vaccines, such as: Influenza vaccine. This is recommended every year. Tetanus, diphtheria, and acellular pertussis (Tdap, Td) vaccine. You may need a Td booster every 10  years. Zoster vaccine. You may need this after age 23. Pneumococcal 13-valent conjugate (PCV13) vaccine. One dose is recommended after age 54. Pneumococcal polysaccharide (PPSV23) vaccine. One dose is recommended after age 65. Talk to your health care provider about which screenings and vaccines you need and how often you need them. This information is not intended to replace advice given to you by your health care provider. Make sure you discuss any questions you have with your health care provider. Document Released: 06/16/2015 Document Revised: 02/07/2016 Document Reviewed: 03/21/2015 Elsevier Interactive Patient Education  2017 [place] Prevention in the Home Falls can cause injuries. They can happen to people of all ages. There are many things you can do to make your home safe and to help prevent falls. What can I do on the outside of my home? Regularly fix the edges of walkways and driveways and fix any cracks. Remove anything that might make you trip as you walk through a door, such as a raised step or threshold. Trim any bushes or trees on the path to your home. Use bright outdoor lighting. Clear any walking paths of anything that might make someone trip, such as rocks or tools. Regularly check to see if handrails are loose or broken. Make sure that both sides of any steps have handrails. Any raised decks and porches should have guardrails on the edges. Have any leaves, snow, or ice cleared regularly. Use sand or salt on walking paths during winter. Clean up any spills in your garage right away. This includes oil or grease spills. What can I do in the bathroom? Use night lights. Install grab bars by the toilet and in the tub and shower. Do not use towel bars as grab bars. Use non-skid mats or decals in the tub or shower. If you need to sit down in the shower, use a plastic, non-slip stool. Keep the floor dry. Clean up any water that spills on the floor as soon as it  happens. Remove soap buildup in the tub or shower regularly. Attach bath mats securely with double-sided non-slip rug tape. Do not have throw rugs and other things on the floor that can make you trip. What can I do in the bedroom? Use night lights. Make sure that you have a light by your bed that is easy to reach. Do not use any sheets or blankets that are too big for your bed. They should not hang down onto the floor. Have a firm chair that has side arms. You can use this for support while you get dressed. Do not have throw rugs and other things on the floor that can make you trip. What can I do in the kitchen? Clean up any spills right away. Avoid walking on wet floors. Keep items that you use a lot in easy-to-reach places. If you need to reach something above you, use a strong step stool that has a grab bar. Keep electrical cords out of the way. Do not use floor polish or wax that makes floors slippery. If you must use wax, use non-skid floor wax. Do not have throw rugs and other things on the floor  that can make you trip. What can I do with my stairs? Do not leave any items on the stairs. Make sure that there are handrails on both sides of the stairs and use them. Fix handrails that are broken or loose. Make sure that handrails are as long as the stairways. Check any carpeting to make sure that it is firmly attached to the stairs. Fix any carpet that is loose or worn. Avoid having throw rugs at the top or bottom of the stairs. If you do have throw rugs, attach them to the floor with carpet tape. Make sure that you have a light switch at the top of the stairs and the bottom of the stairs. If you do not have them, ask someone to add them for you. What else can I do to help prevent falls? Wear shoes that: Do not have high heels. Have rubber bottoms. Are comfortable and fit you well. Are closed at the toe. Do not wear sandals. If you use a stepladder: Make sure that it is fully opened.  Do not climb a closed stepladder. Make sure that both sides of the stepladder are locked into place. Ask someone to hold it for you, if possible. Clearly mark and make sure that you can see: Any grab bars or handrails. First and last steps. Where the edge of each step is. Use tools that help you move around (mobility aids) if they are needed. These include: Canes. Walkers. Scooters. Crutches. Turn on the lights when you go into a dark area. Replace any light bulbs as soon as they burn out. Set up your furniture so you have a clear path. Avoid moving your furniture around. If any of your floors are uneven, fix them. If there are any pets around you, be aware of where they are. Review your medicines with your doctor. Some medicines can make you feel dizzy. This can increase your chance of falling. Ask your doctor what other things that you can do to help prevent falls. This information is not intended to replace advice given to you by your health care provider. Make sure you discuss any questions you have with your health care provider. Document Released: 03/16/2009 Document Revised: 10/26/2015 Document Reviewed: 06/24/2014 Elsevier Interactive Patient Education  2017 [employer].

## 2022-09-02 NOTE — Progress Notes (Signed)
Subjective:   Deborah Baxter is a 89 y.o. female who presents for Medicare Annual (Subsequent) preventive examination.  Review of Systems    No ROS.  Medicare Wellness Virtual Visit.  Visual/audio telehealth visit, UTA vital signs.   See social history for additional risk factors.   Cardiac Risk Factors include: advanced age (>1men, >62 women)     Objective:    Today's Vitals   09/02/22 1025  Weight: 145 lb (65.8 kg)  Height: 5' 7.5" (1.715 m)   Body mass index is 22.38 kg/m.     09/02/2022   10:31 AM 08/20/2021   11:31 AM 03/20/2021   10:53 AM 08/17/2020    9:52 AM 08/17/2019    9:47 AM 08/14/2018    9:10 AM 08/12/2017    9:21 AM  Advanced Directives  Does Patient Have a Medical Advance Directive? No Yes No Yes Yes Yes Yes  Type of Lawyer of [place];Living will [place];Living will Healthcare Power of [place]  Does patient want to make changes to medical advance directive?  No - Patient declined  No - Patient declined No - Patient declined No - Patient declined No - Patient declined  Copy of [place] in Chart?  No - copy requested  Yes - validated most recent copy scanned in chart (See row information) Yes - validated most recent copy scanned in chart (See row information) Yes - validated most recent copy scanned in chart (See row information) No - copy requested  Would patient like information on creating a medical advance directive? No - Patient declined  No - Patient declined        Current Medications (verified) Outpatient Encounter Medications as of 09/02/2022  Medication Sig   acetaminophen (TYLENOL) 650 MG CR tablet Take 650 mg by mouth every 8 (eight) hours as needed for pain. Reported on 08/02/2015   furosemide (LASIX) 40 MG tablet Take 0.5 tablets (20 mg total) by mouth daily.   metoprolol succinate (TOPROL XL) 25 MG 24 hr tablet Take 0.5  tablets (12.5 mg total) by mouth daily.   Multiple Vitamin (MULTIVITAMIN) tablet Take 1 tablet by mouth daily.   OVER THE COUNTER MEDICATION Place 1 drop into both eyes every 8 (eight) hours as needed (dry eyes.). Lubricant eye drops   Polyethylene Glycol 3350 (MIRALAX PO) Take 17 g by mouth every 3 (three) days. Every 3 days   potassium chloride SA (KLOR-CON M) 20 MEQ tablet Take 20 mEq by mouth. Takes occasionally   rivaroxaban (XARELTO) 20 MG TABS tablet Take 1 tablet (20 mg total) by mouth daily with supper.   Facility-Administered Encounter Medications as of 09/02/2022  Medication   sodium chloride flush (NS) 0.9 % injection 3 mL    Allergies (verified) Quinolones   History: Past Medical History:  Diagnosis Date   (HFpEF) heart failure with preserved ejection fraction 04/24/2021   Pulmonary raised concern - question of pulmonary hypertension secondary to DD.  Question of need for right heart cath   A-fib 04/24/2021   Acute heart failure with preserved ejection fraction    Ascending aorta dilatation 08/01/2021   Atrial fibrillation with rapid ventricular response 03/20/2021   Congestion of nasal sinus 10/13/2021   Cough 04/23/2021   Diverticulosis 09/05/2012   Colonoscopy 08/31/12 - redundant colon and diverticulosis in the sigmoid colon, descending colon, transverse colon and hepatic flexure.  Recommend f/u colonoscopy in 10  years.    Elevated bilirubin 09/13/2021   Elevated blood pressure reading 12/05/2020   Health care maintenance 07/13/2014   Physical 07/12/14.  Colonoscopy 08/31/12.  Normal bone density 04/06/10.  Mammogram 05/30/20- Birads I.     Hx of degenerative disc disease    Hypercholesterolemia    Hypertensive urgency 03/20/2021   Ingrown toenail 09/13/2021   Left hand pain 03/20/2021   Low back pain 01/29/2021   Lumbago 07/13/2014   Lumbar spinal stenosis 06/27/2012   Neuromuscular disorder    pinched nerve   Numbness and tingling in left hand 08/21/2017   Osteoarthritis     Pleural effusion 09/13/2021   Pulmonary edema 03/20/2021   Seasonal allergies    Spinal stenosis    chronic back and leg pain   Stress 02/03/2021   Syncope 12/05/2020   Thrombocytopenia 09/13/2021   Past Surgical History:  Procedure Laterality Date   APPENDECTOMY     bladder tack surgery  2000   [place] N/A 05/11/2021   Procedure: CARDIOVERSION;  Surgeon: Minna Merritts, MD;  Location: [facility] ORS;  Service: Cardiovascular;  Laterality: N/A;   CARPAL TUNNEL RELEASE     CATARACT EXTRACTION W/PHACO Left 12/07/2015   Procedure: CATARACT EXTRACTION PHACO AND INTRAOCULAR LENS PLACEMENT ([place]);  Surgeon: Eulogio Bear, MD;  Location: [facility] ORS;  Service: Ophthalmology;  Laterality: Left;  [place] 1.23 AP% 23.2 CDE 19.28 Fluid Pack Lot # [id number]   EYE SURGERY  03/19/15   GANGLION CYST EXCISION     right hand   GAS/FLUID EXCHANGE Left 03/08/2015   Procedure: GAS/FLUID EXCHANGE;  Surgeon: Milus Height, MD;  Location: [facility] ORS;  Service: Ophthalmology;  Laterality: Left;   RIGHT HEART CATH N/A 11/06/2021   Procedure: RIGHT HEART CATH;  Surgeon: Nelva Bush, MD;  Location: [place] [facility];  Service: Cardiovascular;  Laterality: N/A;   TONSILLECTOMY     TUBAL LIGATION     VITRECTOMY 25 GAUGE WITH SCLERAL BUCKLE Left 03/08/2015   Procedure: VITRECTOMY 25 GAUGE, membrane peel, 26 %  SF6 gas exchange;  Surgeon: Milus Height, MD;  Location: [facility] ORS;  Service: Ophthalmology;  Laterality: Left;  [place] lot # [id number]   Family History  Problem Relation Age of Onset   Diabetes Mother    Heart disease Father        Unknown age of onset. Unknown if had CHF or heart attack.   Leukemia Father    Breast cancer Sister        in her 75's   Cancer Sister        lung cancer   Thyroid cancer Sister    Breast cancer Maternal Aunt    Colon cancer Neg Hx    Social History   Socioeconomic History   Marital status: Widowed    Spouse name: Not on file   Number of children: 5    Years of education: Not on file   Highest education level: Not on file  Occupational History   Not on file  Tobacco Use   Smoking status: Former    Packs/day: 0.25    Years: 10.00    Additional pack years: 0.00    Total pack years: 2.50    Types: Cigarettes    Quit date: 58    Years since quitting: 51.2   Smokeless tobacco: Never  Vaping Use   Vaping Use: Never used  Substance and Sexual Activity   Alcohol use: Yes    Alcohol/week: 0.0 standard  drinks of alcohol    Comment: occassional   Drug use: No   Sexual activity: Not on file  Other Topics Concern   Not on file  Social History Narrative   Not on file   Social Determinants of Health   Financial Resource Strain: Low Risk  (09/02/2022)   Overall Financial Resource Strain (CARDIA)    Difficulty of Paying Living Expenses: Not very hard  Food Insecurity: No Food Insecurity (09/02/2022)   Hunger Vital Sign    Worried About Running Out of Food in the Last Year: Never true    Ran Out of Food in the Last Year: Never true  Transportation Needs: No Transportation Needs (09/02/2022)   PRAPARE - Hydrologist (Medical): No    Lack of Transportation (Non-Medical): No  Physical Activity: Insufficiently Active (09/02/2022)   Exercise Vital Sign    Days of Exercise per Week: 3 days    Minutes of Exercise per Session: 20 min  Stress: No Stress Concern Present (09/02/2022)   [place]    Feeling of Stress : Not at all  Social Connections: Unknown (09/02/2022)   Social Connection and Isolation Panel [NHANES]    Frequency of Communication with Friends and Family: More than three times a week    Frequency of Social Gatherings with Friends and Family: More than three times a week    Attends Religious Services: More than 4 times per year    Active Member of [employer] or Organizations: Not on file    Attends Archivist Meetings: Not on file     Marital Status: Widowed    Tobacco Counseling Counseling given: Not Answered   Clinical Intake:  Pre-visit preparation completed: Yes        Diabetes: No  How often do you need to have someone help you when you read instructions, pamphlets, or other written materials from your doctor or pharmacy?: 1 - Never    Interpreter Needed?: No      Activities of Daily Living    09/02/2022   10:33 AM 11/06/2021    8:06 AM  In your present state of health, do you have any difficulty performing the following activities:  Hearing? 0 0  Vision? 0 0  Difficulty concentrating or making decisions? 0 0  Walking or climbing stairs? 0 1  Comment Paces self with activity   Dressing or bathing? 0 0  Doing errands, shopping? 0   Preparing Food and eating ? N   Using the Toilet? N   In the past six months, have you accidently leaked urine? Y   Comment Managed with daily pad. Followed by pcp.   Do you have problems with loss of bowel control? N   Managing your Medications? N   Managing your Finances? N   Housekeeping or managing your Housekeeping? N     Patient Care Team: Einar Pheasant, MD as PCP - General (Internal Medicine) End, Harrell Gave, MD as PCP - Cardiology (Cardiology)  Indicate any recent Medical Services you may have received from other than [facility] providers in the past year (date may be approximate).     Assessment:   This is a routine wellness examination for Deborah Baxter.  I connected with  Deborah Baxter on 09/02/22 by a audio enabled telemedicine application and verified that I am speaking with the correct person using two identifiers.  Patient Location: Home  Provider Location: Office/Clinic  I discussed the limitations  of evaluation and management by telemedicine. The patient expressed understanding and agreed to proceed.   Hearing/Vision screen Hearing Screening - Comments:: Patient is able to hear conversational tones without difficulty. No issues reported. Vision  Screening - Comments:: Followed by [facility] Wears corrective lenses Cataract extraction, L eye only    Dietary issues and exercise activities discussed: Current Exercise Habits: Home exercise routine, Type of exercise: stretching, Intensity: Mild   Goals Addressed               This Visit's Progress     Patient Stated     Maintain Healthy Lifestyle (pt-stated)        Stay hydrated Stay active Healthy diet; monitor salt intake       Depression Screen    09/02/2022   10:26 AM 05/14/2022   10:23 AM 02/06/2022   11:55 AM 12/05/2021   10:13 AM 08/20/2021   11:29 AM 07/31/2021   10:21 AM 04/23/2021   11:21 AM  PHQ 2/9 Scores  PHQ - 2 Score 0 0 0 0 0 0 0    Fall Risk    09/02/2022   10:32 AM 05/14/2022   10:23 AM 02/06/2022   11:55 AM 12/05/2021   10:13 AM 07/31/2021   10:22 AM  Fall Risk   Falls in the past year? 1 0 0 0 1  Number falls in past yr: 0 0 0  0  Injury with Fall? 0 0 0  0  Comment Sprain R ankle when walking. Sought medical care.      Risk for fall due to :  No Fall Risks No Fall Risks No Fall Risks Impaired balance/gait;Impaired mobility;History of fall(s)  Follow up Falls evaluation completed;Falls prevention discussed Falls evaluation completed Falls evaluation completed Falls evaluation completed Falls evaluation completed;Falls prevention discussed    FALL RISK PREVENTION PERTAINING TO THE HOME: Home free of loose throw rugs in walkways, pet beds, electrical cords, etc? Yes  Adequate lighting in your home to reduce risk of falls? Yes   ASSISTIVE DEVICES UTILIZED TO PREVENT FALLS: Life alert? No  Use of a cane, walker or w/c? Yes , cane as needed Grab bars in the bathroom? Yes  Shower chair or bench in shower? Yes  Elevated toilet seat or a handicapped toilet? No   TIMED UP AND GO: Was the test performed? No .   Cognitive Function:    08/12/2017    9:23 AM 08/02/2016   10:42 AM 08/02/2015   10:56 AM  MMSE - Mini Mental State Exam   Orientation to time 5 5 5   Orientation to Place 5 5 5   Registration 3 3 3   Attention/ Calculation 5 5 5   Recall 3 3 3   Language- name 2 objects 2 2 2   Language- repeat 1 1 1   Language- follow 3 step command 3 3 3   Language- read & follow direction 1 1 1   Write a sentence 1 1 1   Copy design 1 1 1   Total score 30 30 30         09/02/2022   10:34 AM 08/17/2020    9:53 AM 08/17/2019    9:57 AM 08/14/2018    9:13 AM  6CIT Screen  What Year? 0 points 0 points 0 points 0 points  What month? 0 points 0 points 0 points 0 points  What time? 0 points 0 points 0 points 0 points  Count back from 20 0 points 0 points 0 points 0 points  Months  in reverse 0 points 0 points 0 points 0 points  Repeat phrase 0 points 0 points  0 points  Total Score 0 points 0 points  0 points    Immunizations Immunization History  Administered Date(s) Administered   Influenza Split 02/15/2014   Influenza, High Dose Seasonal PF 03/05/2018, 03/16/2019, 04/07/2022   Influenza-Unspecified 03/04/2012, 03/24/2013, 03/08/2014, 02/20/2015, 03/05/2016, 03/11/2017, 03/15/2019, 04/24/2020   Moderna Sars-Covid-2 Vaccination 06/25/2019, 07/23/2019, 05/08/2020   Pneumococcal Conjugate-13 07/08/2013   Pneumococcal Polysaccharide-23 07/12/2014   Zoster Recombinat (Shingrix) 04/13/2019, 09/10/2019   Covid-19 vaccine status: Completed vaccines  Screening Tests Health Maintenance  Topic Date Due   COVID-19 Vaccine (4 - 2023-24 season) 09/18/2022 (Originally 02/01/2022)   MAMMOGRAM  12/26/2022   INFLUENZA VACCINE  01/02/2023   Medicare Annual Wellness (AWV)  09/02/2023   Pneumonia Vaccine 62+ Years old  Completed   DEXA SCAN  Completed   Zoster Vaccines- Shingrix  Completed   HPV VACCINES  Aged Out   DTaP/Tdap/Td  Discontinued    Health Maintenance There are no preventive care reminders to display for this patient.  Mammogram- ordered, per consent. [facility] [phone].  Lung Cancer Screening:  (Low Dose CT Chest recommended if Age 60-80 years, 30 pack-year currently smoking OR have quit w/in 15years.) does not qualify.   Hepatitis C Screening: does not qualify.  Vision Screening: Recommended annual ophthalmology exams for early detection of glaucoma and other disorders of the eye.  Dental Screening: Recommended annual dental exams for proper oral hygiene  Community Resource Referral / Chronic Care Management: CRR required this visit?  No   CCM required this visit?  No      Plan:     I have personally reviewed and noted the following in the patient's chart:   Medical and social history Use of alcohol, tobacco or illicit drugs  Current medications and supplements including opioid prescriptions. Patient is not currently taking opioid prescriptions. Functional ability and status Nutritional status Physical activity Advanced directives List of other physicians Hospitalizations, surgeries, and ER visits in previous 12 months Vitals Screenings to include cognitive, depression, and falls Referrals and appointments  In addition, I have reviewed and discussed with patient certain preventive protocols, quality metrics, and best practice recommendations. A written personalized care plan for preventive services as well as general preventive health recommendations were provided to patient.     Leta Jungling, LPN   [id number]

## 2022-09-09 ENCOUNTER — Other Ambulatory Visit: Payer: Medicare Other | Admitting: Pharmacist

## 2022-09-09 NOTE — Progress Notes (Signed)
09/09/2022 Name: Deborah Baxter MRN: [id number] DOB: Oct 24, 1933  Chief Complaint  Patient presents with   Medication Management    Deborah Baxter is a 89 y.o. year old female who presented for a telephone visit.   They were referred to the pharmacist by their PCP for assistance in managing medication access.    Subjective:  Care Team: Primary Care Provider: Dale Kirby, MD ; Next Scheduled Visit: 12/17/22  Medication Access/Adherence  Current Pharmacy:  [facility] [address] ([address]), [place] - [address] [place] ([place]) [place] [address] Phone: [phone] Fax: [phone]  [facility] [address] [place] - [address] [place] [address] Phone: [phone] Fax: [phone]   Patient reports affordability concerns with their medications: Yes  Patient reports access/transportation concerns to their pharmacy: No  Patient reports adherence concerns with their medications:  No     Heart Failure:  Current medications:  ACEi/ARB/ARNI: none SGLT2i: none Beta blocker: metoprolol succinate 12.5 mg daily Mineralocorticoid Receptor Antagonist: none Diuretic regimen: furosemide 20 mg daily; potassium 20 mEq PRN  Patient denies volume overload signs or symptoms including shortness of breath, lower extremity edema, increased use of pillows at night  Atrial Fibrillation:  Current medications: Rate Control: metoprolol succinate 12.5 mg daily Rhythm Control: none Anticoagulation Regimen: Xarelto 20 mg daily  Current medication access support: last year, utilized [employer] program when she hit the Medicare Coverage Gap   Objective:   Lab Results  Component Value Date   CREATININE 0.62 08/14/2022   BUN 15 08/14/2022   NA 138 08/14/2022   K 4.8 08/14/2022   CL 102 08/14/2022   CO2 32 08/14/2022    Lab Results  Component Value Date   CHOL 197 08/14/2022   HDL  70.30 08/14/2022   LDLCALC 109 (H) 08/14/2022   LDLDIRECT 104.0 08/12/2017   TRIG 87.0 08/14/2022   CHOLHDL 3 08/14/2022    Medications Reviewed Today     Reviewed by Alden Hipp, RPH-CPP (Pharmacist) on 09/09/22 at [phone]  Med List Status: <None>   Medication Order Taking? Sig Documenting Provider Last Dose Status Informant  acetaminophen (TYLENOL) 650 MG CR tablet [id number] Yes Take 650 mg by mouth every 8 (eight) hours as needed for pain. Reported on 08/02/2015 [provider] Taking Active Self  furosemide (LASIX) 40 MG tablet [id number] Yes Take 0.5 tablets (20 mg total) by mouth daily. Sondra Barges, PA-C Taking Active   metoprolol succinate (TOPROL XL) 25 MG 24 hr tablet [id number] Yes Take 0.5 tablets (12.5 mg total) by mouth daily. Sondra Barges, PA-C Taking Active   Multiple Vitamin (MULTIVITAMIN) tablet [id number] Yes Take 1 tablet by mouth daily. [provider] Taking Active Self  OVER THE COUNTER MEDICATION [id number] Yes Place 1 drop into both eyes every 8 (eight) hours as needed (dry eyes.). Lubricant eye drops [provider] Taking Active Self  Polyethylene Glycol 3350 (MIRALAX PO) [id number] Yes Take 17 g by mouth every 3 (three) days. Every 3 days [provider] Taking Active Self  potassium chloride SA (KLOR-CON M) 20 MEQ tablet [id number] No Take 20 mEq by mouth. Takes occasionally  Patient not taking: Reported on 09/09/2022   [provider] Not Taking Active   rivaroxaban (XARELTO) 20 MG TABS tablet [id number] Yes Take 1 tablet (20 mg total) by mouth daily with supper. Sondra Barges, PA-C Taking Active   sodium chloride flush (NS) 0.9 %  injection 3 mL [id number]   Sondra Barges, PA-C  Consider Medication Status and Discontinue (Completed Course)               Assessment/Plan:   Heart Failure: - Currently appropriately managed given age and symptom burden - Recommend to continue current regimen  Atrial Fibrillation: -  Currently controlled but with medication access concerns - Discussed income and assets. She may qualify for Medicare Extra Help. Assisted in application today, discussed that results would come in the mail in 4-6 weeks. If approved, copay for Xarelto would be ~$11/90 day supply. If denied, the only option at this time would be to continue to fill on insurance until she is in the Coverage Gap, then utilize the manufacturer program [employer] program is now called Xarelto withMe) to purchase Xarelto for $89/30 day or $250/90 day. She would not qualify for Eliquis assistance due to out of pocket spend requirement until she hits the Coverage Gap, so no benefit in switching to that product. Patient verbalizes understanding - Recommend to continue current regimen at this time.  Follow Up Plan: Patient will call me when she receives notification from [facility] [place] [facility] about Extra Help  Catie TClearance Coots, PharmD, BCACP, CPP [facility] [phone]

## 2022-09-09 NOTE — Patient Instructions (Signed)
Ms. Deborah Baxter,   It was great talking to you today!  We applied for Medicare Extra Help/Low Income Subsidy. If approved, your Xarelto copay will decrease to $11.20 for a 30 or 90 day supply.   If denied, the best option at this time is to continue to fill Xarelto with your insurance, and switch to receiving from the manufacturer/[employer] program like you did last year when you hit the Coverage Gap ($89 for a 30 day supply or $240 for a 90 day supply).   Please call me when you receive your determination letter from the [employer]   Thanks!  Catie Eppie Gibson, PharmD, BCACP, CPP [facility] [phone]

## 2022-09-10 DIAGNOSIS — M9903 Segmental and somatic dysfunction of lumbar region: Secondary | ICD-10-CM | POA: Diagnosis not present

## 2022-09-10 DIAGNOSIS — M9905 Segmental and somatic dysfunction of pelvic region: Secondary | ICD-10-CM | POA: Diagnosis not present

## 2022-09-10 DIAGNOSIS — M5136 Other intervertebral disc degeneration, lumbar region: Secondary | ICD-10-CM | POA: Diagnosis not present

## 2022-09-10 DIAGNOSIS — M955 Acquired deformity of pelvis: Secondary | ICD-10-CM | POA: Diagnosis not present

## 2022-09-23 ENCOUNTER — Encounter: Payer: Self-pay | Admitting: Pulmonary Disease

## 2022-09-24 ENCOUNTER — Telehealth: Payer: Self-pay | Admitting: Internal Medicine

## 2022-09-24 ENCOUNTER — Other Ambulatory Visit: Payer: Self-pay

## 2022-09-24 MED ORDER — FUROSEMIDE 40 MG PO TABS: 20.0000 mg | ORAL_TABLET | Freq: Every day | ORAL | 0 refills | Status: DC

## 2022-09-24 NOTE — Telephone Encounter (Signed)
Pt called in asking if Dr. Lorin Picket wants her to continue med furosemide (LASIX) 40 MG tablet. If so, pt would like a refill sent to [facility] on [address].?? Pt would like a phone call back.

## 2022-09-24 NOTE — Telephone Encounter (Signed)
Patient is taking 20 mg of lasix q day. Prescribed by cardiology. Are you ok with filling for her?

## 2022-09-24 NOTE — Telephone Encounter (Signed)
Confirmed ok. Refill sent

## 2022-09-24 NOTE — Telephone Encounter (Signed)
Yes ok to refill - if she has been taking  regularly and no problems.

## 2022-10-08 DIAGNOSIS — M9905 Segmental and somatic dysfunction of pelvic region: Secondary | ICD-10-CM | POA: Diagnosis not present

## 2022-10-08 DIAGNOSIS — M9903 Segmental and somatic dysfunction of lumbar region: Secondary | ICD-10-CM | POA: Diagnosis not present

## 2022-10-08 DIAGNOSIS — M955 Acquired deformity of pelvis: Secondary | ICD-10-CM | POA: Diagnosis not present

## 2022-10-08 DIAGNOSIS — M5136 Other intervertebral disc degeneration, lumbar region: Secondary | ICD-10-CM | POA: Diagnosis not present

## 2022-10-15 ENCOUNTER — Ambulatory Visit: Payer: Medicare Other | Admitting: Podiatry

## 2022-10-17 ENCOUNTER — Ambulatory Visit (INDEPENDENT_AMBULATORY_CARE_PROVIDER_SITE_OTHER): Payer: Medicare Other | Admitting: Podiatry

## 2022-10-17 DIAGNOSIS — B351 Tinea unguium: Secondary | ICD-10-CM

## 2022-10-17 DIAGNOSIS — M79674 Pain in right toe(s): Secondary | ICD-10-CM

## 2022-10-17 DIAGNOSIS — M79675 Pain in left toe(s): Secondary | ICD-10-CM | POA: Diagnosis not present

## 2022-10-17 NOTE — Progress Notes (Signed)
  Subjective:  Patient ID: Deborah Baxter, female    DOB: 1933/09/28,  MRN: [id number]  Chief Complaint  Patient presents with   Nail Problem    Nail trim    89 y.o. female returns for the above complaint.  Patient presents with thickened elongated dystrophic toenails x10.  Mild pain on palpation.  Patient states that she is not able to do it herself.  She would like for me to debride down.  She denies any other acute complaints  Objective:  There were no vitals filed for this visit. Podiatric Exam: Vascular: dorsalis pedis and posterior tibial pulses are palpable bilateral. Capillary return is immediate. Temperature gradient is WNL. Skin turgor WNL  Sensorium: Normal Semmes Weinstein monofilament test. Normal tactile sensation bilaterally. Nail Exam: Pt has thick disfigured discolored nails with subungual debris noted bilateral entire nail hallux through fifth toenails.  Pain on palpation to the nails. Ulcer Exam: There is no evidence of ulcer or pre-ulcerative changes or infection. Orthopedic Exam: Muscle tone and strength are WNL. No limitations in general ROM. No crepitus or effusions noted.  Skin: No Porokeratosis. No infection or ulcers    Assessment & Plan:   No diagnosis found.     Patient was evaluated and treated and all questions answered.  Onychomycosis with pain  -Nails palliatively debrided as below. -Educated on self-care  Procedure: Nail Debridement Rationale: pain  Type of Debridement: manual, sharp debridement. Instrumentation: Nail nipper, rotary burr. Number of Nails: 10  Procedures and Treatment: Consent by patient was obtained for treatment procedures. The patient understood the discussion of treatment and procedures well. All questions were answered thoroughly reviewed. Debridement of mycotic and hypertrophic toenails, 1 through 5 bilateral and clearing of subungual debris. No ulceration, no infection noted.  Return Visit-Office Procedure: Patient  instructed to return to the office for a follow up visit 3 months for continued evaluation and treatment.  Nicholes Rough, DPM    No follow-ups on file.

## 2022-10-30 ENCOUNTER — Encounter: Payer: Self-pay | Admitting: Pulmonary Disease

## 2022-10-31 IMAGING — MG DIGITAL SCREENING BILAT W/ TOMO W/ CAD
6 of 10 series · 6 of 30 positions shown · non-contrast
Comparison: Previous exam(s).

CLINICAL DATA: Screening.

EXAM:
DIGITAL SCREENING BILATERAL MAMMOGRAM WITH TOMO AND CAD

[R MLO synth-2D]
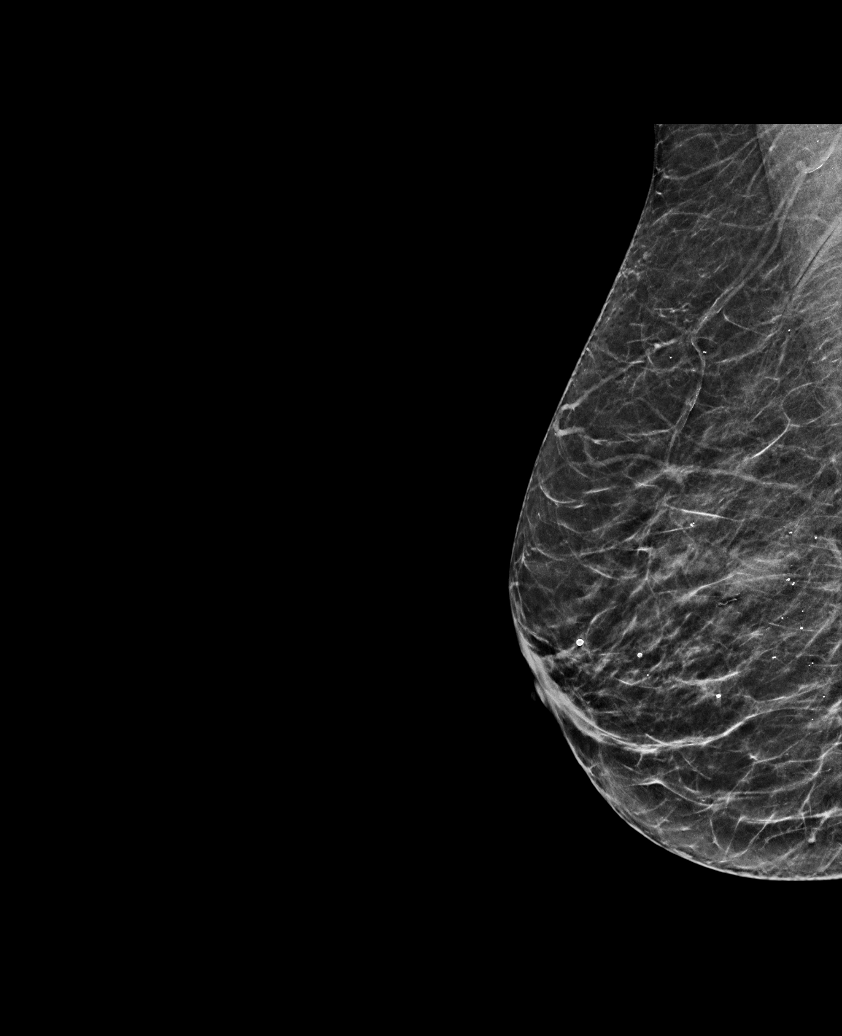

[R CC synth-2D]
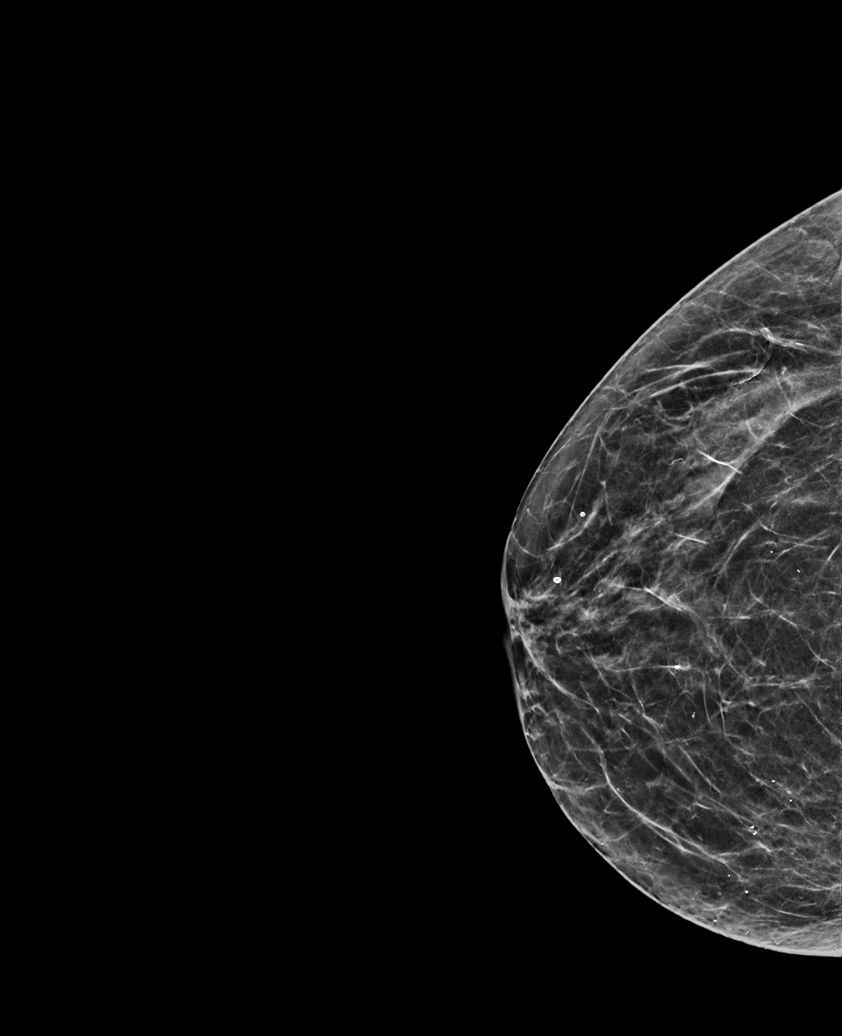

[L MLO synth-2D (1 of 2)]
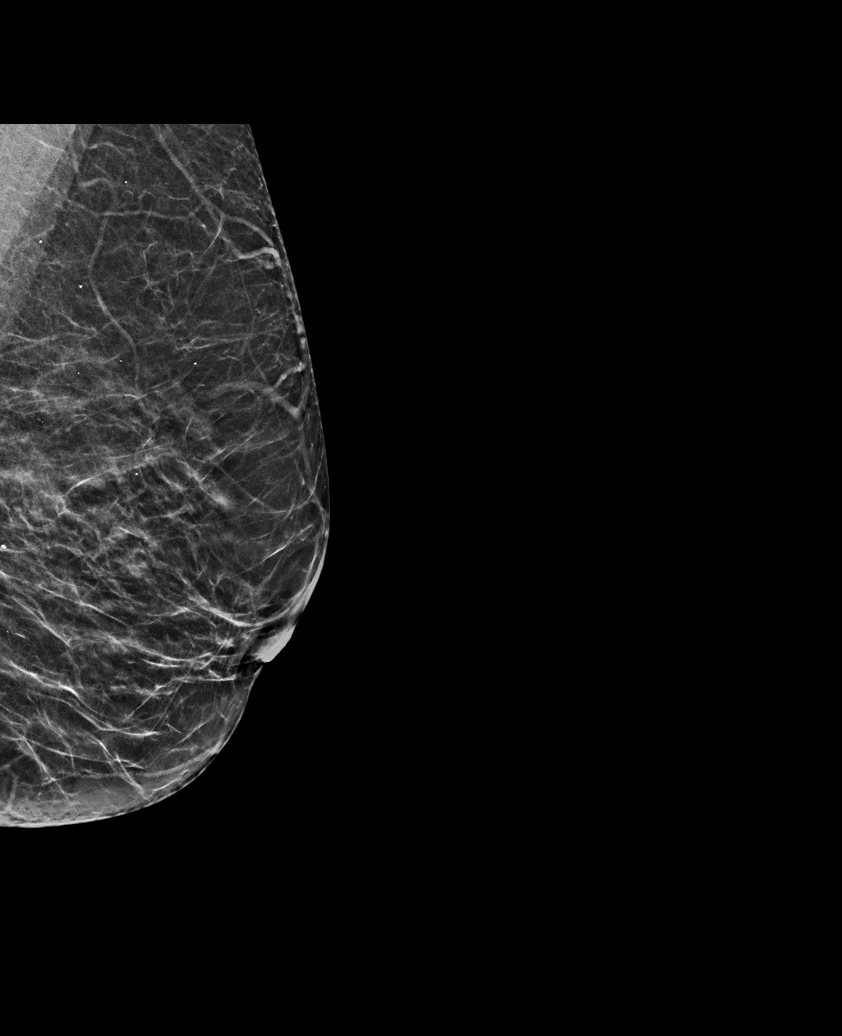

[L CC synth-2D]
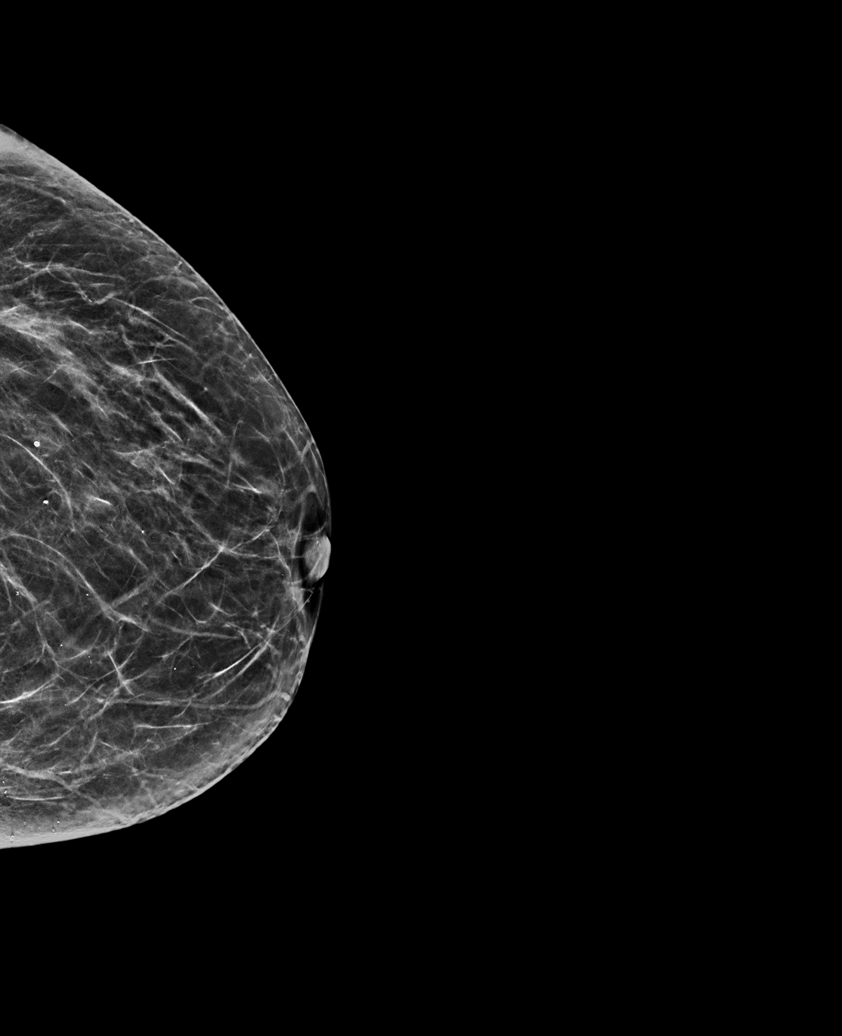

[L MLO synth-2D (2 of 2)]
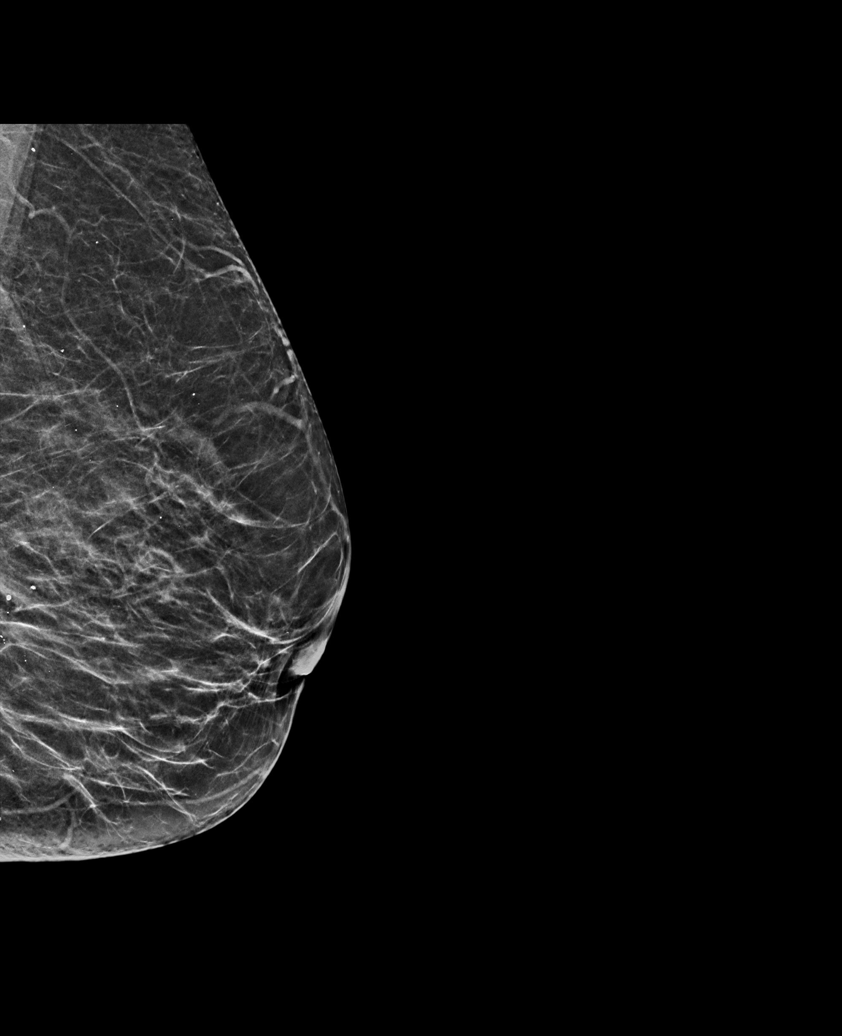

[R MLO tomo · tomo slice 27/54.0]
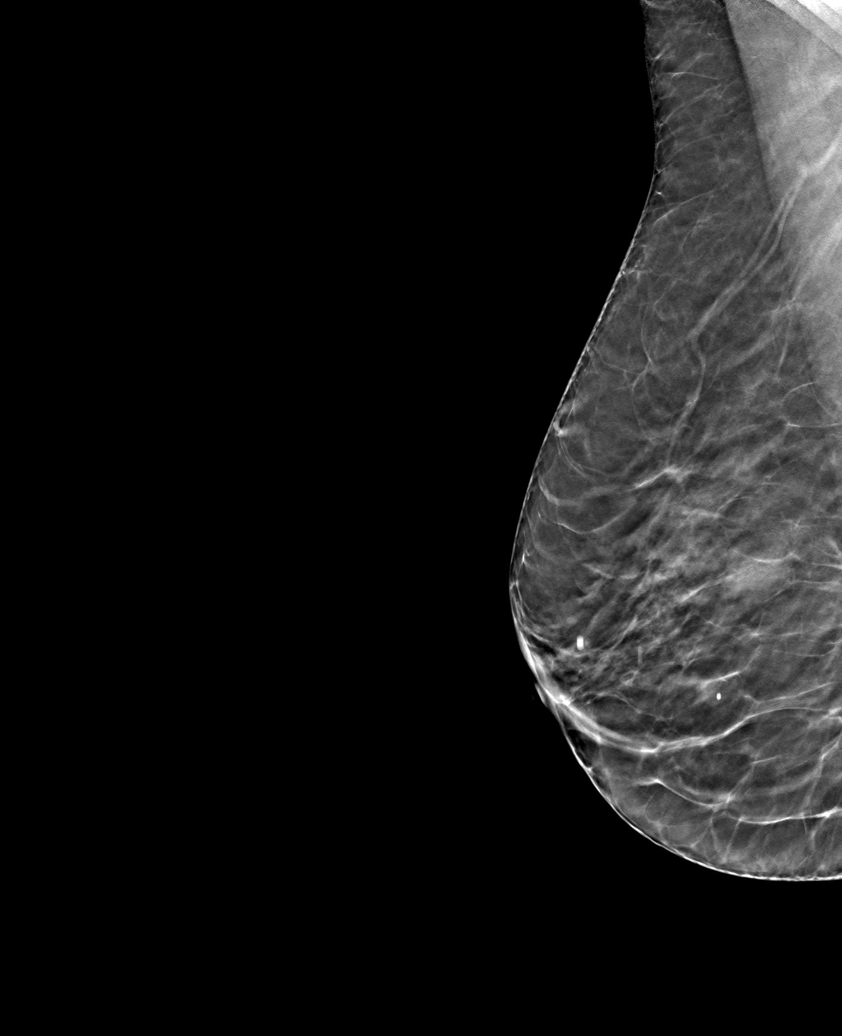

[6 of 30 positions shown; findings below may reference images not displayed]

ACR Breast Density Category b: There are scattered areas of
fibroglandular density.
FINDINGS: There are no findings suspicious for malignancy. Images were
processed with CAD.
IMPRESSION: No mammographic evidence of malignancy. A result letter of this
screening mammogram will be mailed directly to the patient.

RECOMMENDATION:
Screening mammogram in one year. (Code:CN-U-775)

BI-RADS CATEGORY  1: Negative.

## 2022-11-05 DIAGNOSIS — M9905 Segmental and somatic dysfunction of pelvic region: Secondary | ICD-10-CM | POA: Diagnosis not present

## 2022-11-05 DIAGNOSIS — M955 Acquired deformity of pelvis: Secondary | ICD-10-CM | POA: Diagnosis not present

## 2022-11-05 DIAGNOSIS — M9903 Segmental and somatic dysfunction of lumbar region: Secondary | ICD-10-CM | POA: Diagnosis not present

## 2022-11-05 DIAGNOSIS — M5136 Other intervertebral disc degeneration, lumbar region: Secondary | ICD-10-CM | POA: Diagnosis not present

## 2022-12-17 ENCOUNTER — Ambulatory Visit: Payer: Medicare Other | Admitting: Internal Medicine

## 2022-12-27 ENCOUNTER — Ambulatory Visit
Admission: RE | Admit: 2022-12-27 | Discharge: 2022-12-27 | Disposition: A | Discharge status: Home / Self Care | Payer: Medicare Other | Source: Ambulatory Visit | Attending: Internal Medicine | Admitting: Internal Medicine

## 2022-12-27 DIAGNOSIS — Z1231 Encounter for screening mammogram for malignant neoplasm of breast: Secondary | ICD-10-CM | POA: Insufficient documentation

## 2023-01-01 ENCOUNTER — Encounter (INDEPENDENT_AMBULATORY_CARE_PROVIDER_SITE_OTHER): Payer: Self-pay

## 2023-01-14 ENCOUNTER — Encounter: Payer: Self-pay | Admitting: Internal Medicine

## 2023-01-14 ENCOUNTER — Ambulatory Visit (INDEPENDENT_AMBULATORY_CARE_PROVIDER_SITE_OTHER): Payer: Medicare Other | Admitting: Internal Medicine

## 2023-01-14 VITALS — BP 120/74 | HR 72 | Temp 98.0°F | Resp 16 | Ht 67.5 in | Wt 140.2 lb

## 2023-01-14 DIAGNOSIS — Z0000 Encounter for general adult medical examination without abnormal findings: Secondary | ICD-10-CM

## 2023-01-14 DIAGNOSIS — I4891 Unspecified atrial fibrillation: Secondary | ICD-10-CM

## 2023-01-14 DIAGNOSIS — D696 Thrombocytopenia, unspecified: Secondary | ICD-10-CM

## 2023-01-14 DIAGNOSIS — I77810 Thoracic aortic ectasia: Secondary | ICD-10-CM

## 2023-01-14 DIAGNOSIS — E7800 Pure hypercholesterolemia, unspecified: Secondary | ICD-10-CM | POA: Diagnosis not present

## 2023-01-14 DIAGNOSIS — I5030 Unspecified diastolic (congestive) heart failure: Secondary | ICD-10-CM

## 2023-01-14 DIAGNOSIS — M48061 Spinal stenosis, lumbar region without neurogenic claudication: Secondary | ICD-10-CM

## 2023-01-14 NOTE — Assessment & Plan Note (Signed)
Low cholesterol diet and exercise.  Have discussed calculated cholesterol risk and recommendation to start cholesterol medication.  She declines.  Follow lipid panel.

## 2023-01-14 NOTE — Assessment & Plan Note (Signed)
Continue adequate blood pressure control.  Follow.

## 2023-01-14 NOTE — Assessment & Plan Note (Signed)
Breathing stable. No increased sob.   No evidence of volume overload today on exam. On metoprolol. Off lasix.  Weight stable. Follow.  Follow  metabolic panel. Follow pressures.

## 2023-01-14 NOTE — Progress Notes (Signed)
Subjective:    Patient ID: Deborah Baxter, female    DOB: 11-May-1934, 89 y.o.   MRN: [id number]  Patient here for  Chief Complaint  Patient presents with   Annual Exam    HPI With past history of afib, HFpEF, hypertension and hypercholesterolemia.  She comes in today to follow up on these issues as well as for a complete physical exam.  She is accompanied by her daughter.  History obtained from both of them.  She is doing relatively well.  Feels things are stable.  No chest pain.  Breathing stable.  Weight stable - outside weights mostly 142 pounds.  No abdominal pain.  Taking miralax to help keep bowels regular.  Still with back issues, but is stable.  Able to do ADLs.  Doing her exercise at home.     Past Medical History:  Diagnosis Date   (HFpEF) heart failure with preserved ejection fraction (HCC) 04/24/2021   Pulmonary raised concern - question of pulmonary hypertension secondary to DD.  Question of need for right heart cath   A-fib [facility]) 04/24/2021   Acute heart failure with preserved ejection fraction (HCC)    Ascending aorta dilatation (HCC) 08/01/2021   Atrial fibrillation with rapid ventricular response (HCC) 03/20/2021   Congestion of nasal sinus 10/13/2021   Cough 04/23/2021   Diverticulosis 09/05/2012   Colonoscopy 08/31/12 - redundant colon and diverticulosis in the sigmoid colon, descending colon, transverse colon and hepatic flexure.  Recommend f/u colonoscopy in 10 years.    Elevated bilirubin 09/13/2021   Elevated blood pressure reading 12/05/2020   Health care maintenance 07/13/2014   Physical 07/12/14.  Colonoscopy 08/31/12.  Normal bone density 04/06/10.  Mammogram 05/30/20- Birads I.     Hx of degenerative disc disease    Hypercholesterolemia    Hypertensive urgency 03/20/2021   Ingrown toenail 09/13/2021   Left hand pain 03/20/2021   Low back pain 01/29/2021   Lumbago 07/13/2014   Lumbar spinal stenosis 06/27/2012   Neuromuscular disorder (HCC)    pinched nerve   Numbness  and tingling in left hand 08/21/2017   Osteoarthritis    Pleural effusion 09/13/2021   Pulmonary edema 03/20/2021   Seasonal allergies    Spinal stenosis    chronic back and leg pain   Stress 02/03/2021   Syncope 12/05/2020   Thrombocytopenia (HCC) 09/13/2021   Past Surgical History:  Procedure Laterality Date   APPENDECTOMY     bladder tack surgery  2000   BTL  1976   CARDIOVERSION N/A 05/11/2021   Procedure: CARDIOVERSION;  Surgeon: Antonieta Iba, MD;  Location: [facility] ORS;  Service: Cardiovascular;  Laterality: N/A;   CARPAL TUNNEL RELEASE     CATARACT EXTRACTION W/PHACO Left 12/07/2015   Procedure: CATARACT EXTRACTION PHACO AND INTRAOCULAR LENS PLACEMENT (IOC);  Surgeon: Nevada Crane, MD;  Location: [facility] ORS;  Service: Ophthalmology;  Laterality: Left;  [place] 1.23 AP% 23.2 CDE 19.28 Fluid Pack Lot # [id number]   EYE SURGERY  03/19/15   GANGLION CYST EXCISION     right hand   GAS/FLUID EXCHANGE Left 03/08/2015   Procedure: GAS/FLUID EXCHANGE;  Surgeon: Marcelene Butte, MD;  Location: [facility] ORS;  Service: Ophthalmology;  Laterality: Left;   RIGHT HEART CATH N/A 11/06/2021   Procedure: RIGHT HEART CATH;  Surgeon: Yvonne Kendall, MD;  Location: [facility] INVASIVE CV LAB;  Service: Cardiovascular;  Laterality: N/A;   TONSILLECTOMY     TUBAL LIGATION     VITRECTOMY 25 GAUGE WITH SCLERAL BUCKLE  Left 03/08/2015   Procedure: VITRECTOMY 25 GAUGE, membrane peel, 26 %  SF6 gas exchange;  Surgeon: Marcelene Butte, MD;  Location: [facility] ORS;  Service: Ophthalmology;  Laterality: Left;  casette lot # [id number]   Family History  Problem Relation Age of Onset   Diabetes Mother    Heart disease Father        Unknown age of onset. Unknown if had CHF or heart attack.   Leukemia Father    Breast cancer Sister        in her 55's   Cancer Sister        lung cancer   Thyroid cancer Sister    Breast cancer Maternal Aunt    Colon cancer Neg Hx    Social History   Socioeconomic History   Marital status:  Widowed    Spouse name: Not on file   Number of children: 5   Years of education: Not on file   Highest education level: Not on file  Occupational History   Not on file  Tobacco Use   Smoking status: Former    Current packs/day: 0.00    Average packs/day: 0.3 packs/day for 10.0 years (2.5 ttl pk-yrs)    Types: Cigarettes    Start date: 73    Quit date: 23    Years since quitting: 51.6   Smokeless tobacco: Never  Vaping Use   Vaping status: Never Used  Substance and Sexual Activity   Alcohol use: Yes    Alcohol/week: 0.0 standard drinks of alcohol    Comment: occassional   Drug use: No   Sexual activity: Not on file  Other Topics Concern   Not on file  Social History Narrative   Not on file   Social Determinants of Health   Financial Resource Strain: Low Risk  (09/02/2022)   Overall Financial Resource Strain (CARDIA)    Difficulty of Paying Living Expenses: Not very hard  Food Insecurity: No Food Insecurity (09/02/2022)   Hunger Vital Sign    Worried About Running Out of Food in the Last Year: Never true    Ran Out of Food in the Last Year: Never true  Transportation Needs: No Transportation Needs (09/02/2022)   PRAPARE - Administrator, Civil Service (Medical): No    Lack of Transportation (Non-Medical): No  Physical Activity: Insufficiently Active (09/02/2022)   Exercise Vital Sign    Days of Exercise per Week: 3 days    Minutes of Exercise per Session: 20 min  Stress: No Stress Concern Present (09/02/2022)   [employer] of Occupational Health - Occupational Stress Questionnaire    Feeling of Stress : Not at all  Social Connections: Unknown (09/02/2022)   Social Connection and Isolation Panel [NHANES]    Frequency of Communication with Friends and Family: More than three times a week    Frequency of Social Gatherings with Friends and Family: More than three times a week    Attends Religious Services: More than 4 times per year    Active Member of [employer]  or Organizations: Not on file    Attends Banker Meetings: Not on file    Marital Status: Widowed     Review of Systems  Constitutional:  Negative for appetite change and unexpected weight change.  HENT:  Negative for congestion, sinus pressure and sore throat.   Eyes:  Negative for pain and visual disturbance.  Respiratory:  Negative for cough, chest tightness and shortness of breath.   Cardiovascular:  Negative for chest pain, palpitations and leg swelling.  Gastrointestinal:  Negative for abdominal pain, diarrhea, nausea and vomiting.       Miralax - keeping bowels regular.   Genitourinary:  Negative for difficulty urinating and dysuria.  Musculoskeletal:  Positive for back pain. Negative for joint swelling and myalgias.  Skin:  Negative for color change and rash.  Neurological:  Negative for dizziness and headaches.  Hematological:  Negative for adenopathy. Does not bruise/bleed easily.  Psychiatric/Behavioral:  Negative for agitation and dysphoric mood.        Objective:     BP 120/74   Pulse 72   Temp 98 F (36.7 C)   Resp 16   Ht 5' 7.5" (1.715 m)   Wt 140 lb 3.2 oz (63.6 kg)   LMP 06/26/1985   SpO2 98%   BMI 21.63 kg/m  Wt Readings from Last 3 Encounters:  01/14/23 140 lb 3.2 oz (63.6 kg)  09/02/22 145 lb (65.8 kg)  08/14/22 145 lb 9.6 oz (66 kg)    Physical Exam Vitals reviewed.  Constitutional:      General: She is not in acute distress.    Appearance: Normal appearance. She is well-developed.  HENT:     Head: Normocephalic and atraumatic.     Right Ear: External ear normal.     Left Ear: External ear normal.  Eyes:     General: No scleral icterus.       Right eye: No discharge.        Left eye: No discharge.     Conjunctiva/sclera: Conjunctivae normal.  Neck:     Thyroid: No thyromegaly.  Cardiovascular:     Rate and Rhythm: Normal rate and regular rhythm.  Pulmonary:     Effort: No tachypnea, accessory muscle usage or respiratory  distress.     Breath sounds: Normal breath sounds. No decreased breath sounds or wheezing.  Chest:  Breasts:    Right: No inverted nipple, mass, nipple discharge or tenderness (no axillary adenopathy).     Left: No inverted nipple, mass, nipple discharge or tenderness (no axilarry adenopathy).  Abdominal:     General: Bowel sounds are normal.     Palpations: Abdomen is soft.     Tenderness: There is no abdominal tenderness.  Musculoskeletal:        General: No swelling or tenderness.     Cervical back: Neck supple.  Lymphadenopathy:     Cervical: No cervical adenopathy.  Skin:    Findings: No erythema or rash.  Neurological:     Mental Status: She is alert and oriented to person, place, and time.  Psychiatric:        Mood and Affect: Mood normal.        Behavior: Behavior normal.      Outpatient Encounter Medications as of 01/14/2023  Medication Sig   acetaminophen (TYLENOL) 650 MG CR tablet Take 650 mg by mouth every 8 (eight) hours as needed for pain. Reported on 08/02/2015   metoprolol succinate (TOPROL XL) 25 MG 24 hr tablet Take 0.5 tablets (12.5 mg total) by mouth daily.   Multiple Vitamin (MULTIVITAMIN) tablet Take 1 tablet by mouth daily.   OVER THE COUNTER MEDICATION Place 1 drop into both eyes every 8 (eight) hours as needed (dry eyes.). Lubricant eye drops   Polyethylene Glycol 3350 (MIRALAX PO) Take 17 g by mouth every 3 (three) days. Every 3 days   rivaroxaban (XARELTO) 20 MG TABS tablet Take 1 tablet (20 mg total)  by mouth daily with supper.   [DISCONTINUED] furosemide (LASIX) 40 MG tablet Take 0.5 tablets (20 mg total) by mouth daily.   [DISCONTINUED] potassium chloride SA (KLOR-CON M) 20 MEQ tablet Take 20 mEq by mouth. Takes occasionally (Patient not taking: Reported on 09/09/2022)   Facility-Administered Encounter Medications as of 01/14/2023  Medication   sodium chloride flush (NS) 0.9 % injection 3 mL     Lab Results  Component Value Date   WBC 5.8 05/14/2022    HGB 13.6 05/14/2022   HCT 40.1 05/14/2022   PLT 204.0 05/14/2022   GLUCOSE 88 08/14/2022   CHOL 197 08/14/2022   TRIG 87.0 08/14/2022   HDL 70.30 08/14/2022   LDLDIRECT 104.0 08/12/2017   LDLCALC 109 (H) 08/14/2022   ALT 35 08/14/2022   AST 26 08/14/2022   NA 138 08/14/2022   K 4.8 08/14/2022   CL 102 08/14/2022   CREATININE 0.62 08/14/2022   BUN 15 08/14/2022   CO2 32 08/14/2022   TSH 1.48 05/14/2022   INR 1.2 03/20/2021    Mammogram 3D SCREEN BREAST BILATERAL  Result Date: 12/30/2022 CLINICAL DATA:  Screening. EXAM: DIGITAL SCREENING BILATERAL MAMMOGRAM WITH TOMOSYNTHESIS AND CAD TECHNIQUE: Bilateral screening digital craniocaudal and mediolateral oblique mammograms were obtained. Bilateral screening digital breast tomosynthesis was performed. The images were evaluated with computer-aided detection. COMPARISON:  Previous exam(s). ACR Breast Density Category b: There are scattered areas of fibroglandular density. FINDINGS: There are no findings suspicious for malignancy. IMPRESSION: No mammographic evidence of malignancy. A result letter of this screening mammogram will be mailed directly to the patient. RECOMMENDATION: Screening mammogram in one year. (Code:SM-B-01Y) BI-RADS CATEGORY  1: Negative. Electronically Signed   By: Sherian Rein M.D.   On: 12/30/2022 12:10       Assessment & Plan:  Health care maintenance Assessment & Plan: Physical today 01/14/23.  Colonoscopy 08/2012.  Mammogram 12/27/22 - Birads I.    Hypercholesterolemia Assessment & Plan: Low cholesterol diet and exercise.  Have discussed calculated cholesterol risk and recommendation to start cholesterol medication.  She declines.  Follow lipid panel.    Orders: -     Basic metabolic panel -     Hepatic function panel -     Lipid panel  Heart failure with preserved ejection fraction, unspecified HF chronicity (HCC) Assessment & Plan: Breathing stable. No increased sob.   No evidence of volume overload  today on exam. On metoprolol. Off lasix.  Weight stable. Follow.  Follow  metabolic panel. Follow pressures.    Atrial fibrillation, unspecified type [facility]) Assessment & Plan: Continues on metoprolol and xarelto.  Stable. Follow.     Ascending aorta dilatation (HCC) Assessment & Plan:  Continue adequate blood pressure control.  Follow.    Spinal stenosis of lumbar region, unspecified whether neurogenic claudication present Assessment & Plan: Has known spinal stenosis.  Has seen Dr Yves Dill.  S/p injections.  Follow.   Continue home exercise.    Thrombocytopenia (HCC) Assessment & Plan: Abdominal ultrasound revealed spleen size - wnl.  hgb wnl.  Follow cbc.       Dale Oakdale, MD

## 2023-01-14 NOTE — Assessment & Plan Note (Signed)
Physical today 01/14/23.  Colonoscopy 08/2012.  Mammogram 12/27/22 - Birads I.

## 2023-01-14 NOTE — Assessment & Plan Note (Signed)
Continues on metoprolol and xarelto.  Stable. Follow.

## 2023-01-14 NOTE — Assessment & Plan Note (Signed)
Abdominal ultrasound revealed spleen size - wnl.  hgb wnl.  Follow cbc.  

## 2023-01-14 NOTE — Assessment & Plan Note (Signed)
Has known spinal stenosis.  Has seen Dr Yves Dill.  S/p injections.  Follow.   Continue home exercise.

## 2023-01-21 ENCOUNTER — Ambulatory Visit (INDEPENDENT_AMBULATORY_CARE_PROVIDER_SITE_OTHER): Payer: Medicare Other | Admitting: Podiatry

## 2023-01-21 DIAGNOSIS — B351 Tinea unguium: Secondary | ICD-10-CM | POA: Diagnosis not present

## 2023-01-21 DIAGNOSIS — M79675 Pain in left toe(s): Secondary | ICD-10-CM

## 2023-01-21 DIAGNOSIS — M79674 Pain in right toe(s): Secondary | ICD-10-CM

## 2023-01-21 NOTE — Progress Notes (Signed)
  Subjective:  Patient ID: Deborah Baxter, female    DOB: [birth year],  MRN: [id number]  Chief Complaint  Patient presents with   Nail Problem    Nail trim    89 y.o. female returns for the above complaint.  Patient presents with thickened elongated dystrophic toenails x10.  Mild pain on palpation.  Patient states that she is not able to do it herself.  She would like for me to debride down.  She denies any other acute complaints  Objective:  There were no vitals filed for this visit. Podiatric Exam: Vascular: dorsalis pedis and posterior tibial pulses are palpable bilateral. Capillary return is immediate. Temperature gradient is WNL. Skin turgor WNL  Sensorium: Normal Semmes Weinstein monofilament test. Normal tactile sensation bilaterally. Nail Exam: Pt has thick disfigured discolored nails with subungual debris noted bilateral entire nail hallux through fifth toenails.  Pain on palpation to the nails. Ulcer Exam: There is no evidence of ulcer or pre-ulcerative changes or infection. Orthopedic Exam: Muscle tone and strength are WNL. No limitations in general ROM. No crepitus or effusions noted.  Skin: No Porokeratosis. No infection or ulcers    Assessment & Plan:   1. Pain due to onychomycosis of toenails of both feet       Patient was evaluated and treated and all questions answered.  Onychomycosis with pain  -Nails palliatively debrided as below. -Educated on self-care  Procedure: Nail Debridement Rationale: pain  Type of Debridement: manual, sharp debridement. Instrumentation: Nail nipper, rotary burr. Number of Nails: 10  Procedures and Treatment: Consent by patient was obtained for treatment procedures. The patient understood the discussion of treatment and procedures well. All questions were answered thoroughly reviewed. Debridement of mycotic and hypertrophic toenails, 1 through 5 bilateral and clearing of subungual debris. No ulceration, no infection noted.  Return  Visit-Office Procedure: Patient instructed to return to the office for a follow up visit 3 months for continued evaluation and treatment.  Boneta Lucks, DPM    No follow-ups on file.

## 2023-04-18 ENCOUNTER — Other Ambulatory Visit: Payer: Self-pay

## 2023-04-18 MED ORDER — METOPROLOL SUCCINATE ER 25 MG PO TB24: 12.5000 mg | ORAL_TABLET | Freq: Every day | ORAL | 3 refills | Status: DC

## 2023-04-29 ENCOUNTER — Encounter: Payer: Self-pay | Admitting: Podiatry

## 2023-04-29 ENCOUNTER — Ambulatory Visit (INDEPENDENT_AMBULATORY_CARE_PROVIDER_SITE_OTHER): Payer: Medicare Other | Admitting: Podiatry

## 2023-04-29 DIAGNOSIS — B351 Tinea unguium: Secondary | ICD-10-CM

## 2023-04-29 DIAGNOSIS — M79674 Pain in right toe(s): Secondary | ICD-10-CM

## 2023-04-29 DIAGNOSIS — M79675 Pain in left toe(s): Secondary | ICD-10-CM

## 2023-04-29 NOTE — Progress Notes (Signed)
  Subjective:  Patient ID: Deborah Baxter, female    DOB: [birth year],  MRN: [id number]  Chief Complaint  Patient presents with   Nail Problem    "Clip my toenails and check the bunion."   89 y.o. female returns for the above complaint.  Patient presents with thickened elongated dystrophic toenails x10.  Mild pain on palpation.  Patient states that she is not able to do it herself.  She would like for me to debride down.  She denies any other acute complaints  Objective:  There were no vitals filed for this visit. Podiatric Exam: Vascular: dorsalis pedis and posterior tibial pulses are palpable bilateral. Capillary return is immediate. Temperature gradient is WNL. Skin turgor WNL  Sensorium: Normal Semmes Weinstein monofilament test. Normal tactile sensation bilaterally. Nail Exam: Pt has thick disfigured discolored nails with subungual debris noted bilateral entire nail hallux through fifth toenails.  Pain on palpation to the nails. Ulcer Exam: There is no evidence of ulcer or pre-ulcerative changes or infection. Orthopedic Exam: Muscle tone and strength are WNL. No limitations in general ROM. No crepitus or effusions noted.  Skin: No Porokeratosis. No infection or ulcers    Assessment & Plan:   No diagnosis found.      Patient was evaluated and treated and all questions answered.  Onychomycosis with pain  -Nails palliatively debrided as below. -Educated on self-care  Procedure: Nail Debridement Rationale: pain  Type of Debridement: manual, sharp debridement. Instrumentation: Nail nipper, rotary burr. Number of Nails: 10  Procedures and Treatment: Consent by patient was obtained for treatment procedures. The patient understood the discussion of treatment and procedures well. All questions were answered thoroughly reviewed. Debridement of mycotic and hypertrophic toenails, 1 through 5 bilateral and clearing of subungual debris. No ulceration, no infection noted.  Return  Visit-Office Procedure: Patient instructed to return to the office for a follow up visit 3 months for continued evaluation and treatment.  Nicholes Rough, DPM    No follow-ups on file.

## 2023-05-06 NOTE — Progress Notes (Unsigned)
Cardiology Office Note    Date:  05/07/2023   ID:  ASSETOU KALB, DOB 04-Oct-1933, MRN [id number]  PCP:  Dale [place], MD  Cardiologist:  Yvonne Kendall, MD  Electrophysiologist:  None   Chief Complaint: Follow up  History of Present Illness:   Deborah Baxter is a 89 y.o. 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   Prior ED evaluation for syncope in 11/2020 with negative orthostatics and largely unrevealing labs.  Cardiac enzymes not cycled.  EKG unavailable for review.   She was admitted to [facility] in 01/2021 with acute HFpEF, A. fib with RVR of uncertain chronicity, and moderate pericardial effusion.  EKG demonstrated A. fib with RVR, 121 bpm, low voltage QRS, and nonspecific ST-T changes.  High-sensitivity troponin of 28 with a delta of 17.  BNP 335.  Echo demonstrated a low normal LV systolic function with an EF of 50 to 55%, no regional wall motion abnormalities, moderate LVH, LV diastolic function parameters could not be evaluated, mildly reduced RV systolic function with normal ventricular cavity size, normal PASP, moderate biatrial enlargement, moderate pericardial effusion that was posterior to the LV without evidence of cardiac tamponade, degenerative mitral valve with moderate regurgitation and moderate to severe mitral annular calcification, moderate tricuspid regurgitation, tricuspid aortic valve with trivial insufficiency and mild aortic valve sclerosis without evidence of stenosis, mildly dilated ascending aorta measuring 41 mm, and an estimated right atrial pressure of 8 mmHg.  Given moderate LVH, biatrial enlargement, and low voltage on EKG, infiltrative cardiomyopathy was  recommended to be considered.  She responded well to IV diuresis.  With regards to her A. fib, she was rate controlled with beta-blocker and initiated on Xarelto.   She was seen in hospital follow-up on 03/30/2021 and was doing reasonably well without tachypalpitations and a noted improvement in her energy.  She still noted some exertional shortness of breath.  She reported adherence to medications and was without symptoms of bleeding.  She remained in A. fib with a ventricular rate of 94 bpm on EKG.  She was felt to be still mildly volume up with recommendation to briefly increase furosemide.  Subsequent repeat limited echo on 05/02/2021 demonstrated an EF of 50%, moderate LVH, normal RV systolic function and ventricular cavity size, stable moderate pericardial effusion that was posterior and lateral to the left ventricle without evidence of cardiac tamponade, moderate pleural effusion in the left lateral region, mild mitral regurgitation, trivial aortic insufficiency, mildly dilated ascending aorta measuring 41 mm, and an estimated right atrial pressure of 8 mmHg.   She was evaluated by her PCP in late 04/2021 with URI-like symptoms.  Lasix was again briefly increased.  Chest x-ray showed chronic scarring within the left lung base and no new focal abnormality.   She was seen in follow-up on 05/04/2021 and reported her breathing was largely stable, though did continue to note exertional dyspnea.  Her URI symptoms were improved.  She was without chest pain or palpitations.  Her weights had trended from 163 to 169 pounds.  She was taking furosemide 40 mg daily.  She remained in rate controlled A. fib and had not missed any doses of anticoagulation.  No bleeding concerns.  Her Lasix was again briefly titrated.  She underwent successful DCCV without  complication on 05/11/2021 following 1 synchronized cardioversion at 150 J with postprocedure EKG demonstrating NSR.   She was seen in the on 05/18/2021 and was doing  well from a cardiac perspective.  She had redeveloped A-fib with controlled ventricular response.  She was asymptomatic regarding this.  Her weight was down 6 pounds by our scale.  She was without symptoms of angina or decompensation.  Rate control strategy was pursued given age and she was asymptomatic in A-fib.  With regards to her possible infiltrative cardiomyopathy, patient and family preferred to defer further evaluation of this.  Repeat limited echo on 05/31/2021 demonstrated an EF of 60 to 65%, no regional wall motion abnormalities, severe concentric LVH, indeterminate LV diastolic function parameters, normal RV systolic function and ventricular cavity size, mildly elevated PASP estimated at 38.6 mmHg, moderate biatrial enlargement, moderate pericardial effusion posterior to the LV without evidence of tamponade, mild to moderate mitral regurgitation, moderate mitral annular calcification, moderate tricuspid regurgitation, mild aortic insufficiency, aortic valve sclerosis without evidence of stenosis, mildly dilated ascending aorta measuring 40 mm, and a rhythm of A-fib/flutter.   She was evaluated by her PCP in late 07/2021 with an increase in shortness of breath.  Lasix was briefly titrated.  Chest x-ray showed chronic left base scarring and possible chronic small pleural effusion without new consolidation.  There was also diffuse chronic interstitial coarsening.  Overall, findings were stable when compared to prior plain film.   She underwent repeat limited echo on 08/17/2021 which showed an EF of 55 to 60%, no regional wall motion abnormalities, severe LVH, indeterminate LV diastolic function parameters, normal RV systolic function and ventricular cavity size, moderately elevated PASP estimated at 50.3 mmHg, moderate biatrial enlargement, mild to moderate mitral regurgitation, moderate tricuspid regurgitation, and estimated right atrial pressure of 15 mmHg, and a stable moderate pericardial effusion  without evidence of tamponade.  Given these findings, it was recommended she increase her furosemide to 40 mg twice daily.   She was seen in the office on 08/22/2021, noting stable intermittent shortness of breath when ambulating to the mailbox.  She was without symptoms of angina or decompensation.  Her weight was stable.  Furosemide was reduced back to 40 mg daily.   She was seen in the office in 10/2021 and was without symptoms of angina or decompensation.  Her weight was down from 164 pounds to 138 pounds when compared to her visit in 08/2021.  This weight loss was unintentional.  There was also some concern regarding lower extremity hyperpigmentation that improved with leg elevation with noted normal pulses bilaterally.  She was also noted to have spontaneously converted to sinus rhythm with metoprolol being decreased to 25 mg twice daily secondary to relative hypotension.   She was evaluated by pulmonology on 11/02/2021 with recommendations for overnight pulse oximetry and to move forward with RHC.   She underwent RHC on 11/06/2021 which demonstrated normal left and right heart filling pressures with a mean RA pressure of 5 mmHg and a PCWP of 15 mmHg.  Borderline pulmonary hypertension with a mean PA pressure of 21 mmHg.  Normal cardiac output and index.     She was seen in the office on 11/15/2021 and was without symptoms of angina or decompensation.  She noted no significant dyspnea.     Cardiac MRI on 12/12/2021 demonstrated an EF of 59%, global transmural LGE in a heterogeneous pattern of the LV myocardium, mild circumferential pericardial effusion, moderate asymmetric LVH, and a severely dilated left atrium.  Findings were consistent with an infiltrative cardiomyopathy such as cardiac amyloidosis.   She was seen in the office on 12/25/2021 and was without symptoms of angina or decompensation.  She felt better than she had at her prior several visits. She was transitioned from Lopressor to Toprol XL.   Lasix was decreased based on labs obtained at that time.   She was last seen in the office on 06/17/2022 and remained without symptoms of angina or cardiac decompensation.  She reported continued improvement in functional status and dyspnea and was pleased with her progress.  She comes in today accompanied by a family member and is doing well from a cardiac perspective, without symptoms of angina or cardiac decompensation.  Denies any current dyspnea.  Able to do ADLs such as go up and down stairs and walk to and from mailbox without cardiac limitation.  She does note some mild intermittent dizziness without presyncope or syncope.  Home BP readings are stable largely in the 1 teens to 130s with rare readings in the 90s systolic.  No lower extremity swelling or progressive orthopnea.  Weight remains stable.  Overall, she feels like she is doing well and does not have any acute cardiac concerns at this time   Labs independently reviewed: 01/2023 - TC 202, TG 86, HDL 69, LDL 115, albumin 4.2, AST/ALT normal, potassium 4.4, BUN 13, serum creatinine 0.61 05/2022 - Hgb 13.6, PLT 204, TSH normal  Past Medical History:  Diagnosis Date   (HFpEF) heart failure with preserved ejection fraction (HCC) 04/24/2021   Pulmonary raised concern - question of pulmonary hypertension secondary to DD.  Question of need for right heart cath   A-fib [facility]) 04/24/2021   Acute heart failure with preserved ejection fraction (HCC)    Ascending aorta dilatation (HCC) 08/01/2021   Atrial fibrillation with rapid ventricular response (HCC) 03/20/2021   Congestion of nasal sinus 10/13/2021   Cough 04/23/2021   Diverticulosis 09/05/2012   Colonoscopy 08/31/12 - redundant colon and diverticulosis in the sigmoid colon, descending colon, transverse colon and hepatic flexure.  Recommend f/u colonoscopy in 10 years.    Elevated bilirubin 09/13/2021   Elevated blood pressure reading 12/05/2020   Health care maintenance 07/13/2014   Physical  07/12/14.  Colonoscopy 08/31/12.  Normal bone density 04/06/10.  Mammogram 05/30/20- Birads I.     Hx of degenerative disc disease    Hypercholesterolemia    Hypertensive urgency 03/20/2021   Ingrown toenail 09/13/2021   Left hand pain 03/20/2021   Low back pain 01/29/2021   Lumbago 07/13/2014   Lumbar spinal stenosis 06/27/2012   Neuromuscular disorder (HCC)    pinched nerve   Numbness and tingling in left hand 08/21/2017   Osteoarthritis    Pleural effusion 09/13/2021   Pulmonary edema 03/20/2021   Seasonal allergies    Spinal stenosis    chronic back and leg pain   Stress 02/03/2021   Syncope 12/05/2020   Thrombocytopenia (HCC) 09/13/2021    Past Surgical History:  Procedure Laterality Date   APPENDECTOMY     bladder tack surgery  2000   BTL  1976   CARDIOVERSION N/A 05/11/2021   Procedure: CARDIOVERSION;  Surgeon: Antonieta Iba, MD;  Location: [facility] ORS;  Service: Cardiovascular;  Laterality: N/A;   CARPAL TUNNEL RELEASE     CATARACT EXTRACTION W/PHACO Left 12/07/2015   Procedure: CATARACT EXTRACTION PHACO AND INTRAOCULAR LENS PLACEMENT (IOC);  Surgeon: Nevada Crane, MD;  Location: [facility] ORS;  Service: Ophthalmology;  Laterality: Left;  [place] 1.23 AP% 23.2 CDE 19.28 Fluid Pack Lot # [id number]   EYE SURGERY  03/19/15   GANGLION CYST EXCISION     right hand   GAS/FLUID EXCHANGE Left 03/08/2015   Procedure: GAS/FLUID EXCHANGE;  Surgeon: Marcelene Butte, MD;  Location: [facility] ORS;  Service: Ophthalmology;  Laterality: Left;   RIGHT HEART CATH N/A 11/06/2021   Procedure: RIGHT HEART CATH;  Surgeon: Yvonne Kendall, MD;  Location: [facility] INVASIVE CV LAB;  Service: Cardiovascular;  Laterality: N/A;   TONSILLECTOMY     TUBAL LIGATION     VITRECTOMY 25 GAUGE WITH SCLERAL BUCKLE Left 03/08/2015   Procedure: VITRECTOMY 25 GAUGE, membrane peel, 26 %  SF6 gas exchange;  Surgeon: Marcelene Butte, MD;  Location: [facility] ORS;  Service: Ophthalmology;  Laterality: Left;  casette lot # [id number]     Current Medications: Current Meds  Medication Sig   acetaminophen (TYLENOL) 650 MG CR tablet Take 650 mg by mouth every 8 (eight) hours as needed for pain. Reported on 08/02/2015   metoprolol succinate (TOPROL XL) 25 MG 24 hr tablet Take 0.5 tablets (12.5 mg total) by mouth daily.   Multiple Vitamin (MULTIVITAMIN) tablet Take 1 tablet by mouth daily.   OVER THE COUNTER MEDICATION Place 1 drop into both eyes every 8 (eight) hours as needed (dry eyes.). Lubricant eye drops   Polyethylene Glycol 3350 (MIRALAX PO) Take 17 g by mouth every 3 (three) days. Every 3 days   rivaroxaban (XARELTO) 20 MG TABS tablet Take 1 tablet (20 mg total) by mouth daily with supper.   Current Facility-Administered Medications for the 05/07/23 encounter (Office Visit) with Sondra Barges, PA-C  Medication   sodium chloride flush (NS) 0.9 % injection 3 mL    Allergies:   Quinolones   Social History   Socioeconomic History   Marital status: Widowed    Spouse name: Not on file   Number of children: 5   Years of education: Not on file   Highest education level: Not on file  Occupational History   Not on file  Tobacco Use   Smoking status: Former    Current packs/day: 0.00    Average packs/day: 0.3 packs/day for 10.0 years (2.5 ttl pk-yrs)    Types: Cigarettes    Start date: 34    Quit date: 73    Years since quitting: 51.9   Smokeless tobacco: Never  Vaping Use   Vaping status: Never Used  Substance and Sexual Activity   Alcohol use: Not Currently    Comment: occassional   Drug use: No   Sexual activity: Not on file  Other Topics Concern   Not on file  Social History Narrative   Not on file   Social Determinants of Health   Financial Resource Strain: Low Risk  (09/02/2022)   Overall Financial Resource Strain (CARDIA)    Difficulty of Paying Living Expenses: Not very hard  Food Insecurity: No Food Insecurity (09/02/2022)   Hunger Vital Sign    Worried About Running Out of Food in the Last  Year: Never true    Ran Out of Food in the Last Year: Never true  Transportation Needs: No Transportation Needs (09/02/2022)   PRAPARE - Administrator, Civil Service (Medical): No    Lack of Transportation (Non-Medical): No  Physical Activity: Insufficiently Active (09/02/2022)   Exercise Vital Sign    Days of Exercise per Week: 3 days    Minutes of Exercise per Session: 20 min  Stress:  No Stress Concern Present (09/02/2022)   [employer] of Occupational Health - Occupational Stress Questionnaire    Feeling of Stress : Not at all  Social Connections: Unknown (09/02/2022)   Social Connection and Isolation Panel [NHANES]    Frequency of Communication with Friends and Family: More than three times a week    Frequency of Social Gatherings with Friends and Family: More than three times a week    Attends Religious Services: More than 4 times per year    Active Member of [employer] or Organizations: Not on file    Attends Banker Meetings: Not on file    Marital Status: Widowed     Family History:  The patient's family history includes Breast cancer in her maternal aunt and sister; Cancer in her sister; Diabetes in her mother; Heart disease in her father; Leukemia in her father; Thyroid cancer in her sister. There is no history of Colon cancer.  ROS:   12-point review of systems is negative unless otherwise noted in the HPI.   EKGs/Labs/Other Studies Reviewed:    Studies reviewed were summarized above. The additional studies were reviewed today:  2D echo 03/20/2021: 1. Left ventricular ejection fraction, by estimation, is 50 to 55%. The  left ventricle has low normal function. The left ventricle has no regional  wall motion abnormalities. There is moderate left ventricular hypertrophy.  Left ventricular diastolic  function could not be evaluated.   2. Right ventricular systolic function is mildly reduced. The right  ventricular size is normal. There is normal  pulmonary artery systolic  pressure.   3. Left atrial size was moderately dilated.   4. Right atrial size was moderately dilated.   5. Moderate pericardial effusion. The pericardial effusion is posterior  to the left ventricle. There is no evidence of cardiac tamponade.   6. The mitral valve is degenerative. Moderate mitral valve regurgitation.  Moderate to severe mitral annular calcification.   7. Tricuspid valve is mildly thickened. The tricuspid valve is abnormal.  Tricuspid valve regurgitation is moderate.   8. The aortic valve is tricuspid. There is mild thickening of the aortic  valve. Aortic valve regurgitation is trivial. Mild aortic valve sclerosis  is present, with no evidence of aortic valve stenosis.   9. There is mild dilatation of the ascending aorta, measuring 41 mm.  10. The inferior vena cava is normal in size with <50% respiratory  variability, suggesting right atrial pressure of 8 mmHg.   Conclusion(s)/Recommendation(s): Given moderate LVH, biatrial enlargement,  and low voltage on EKG, infiltrative cardiomyopathy, including  amyloidosis, should be considered.  __________   Limited echo 05/02/2021: 1. Left ventricular ejection fraction, by estimation, is 50%. There is  moderate left ventricular hypertrophy.   2. Right ventricular systolic function is normal. The right ventricular  size is normal.   3. Moderate pericardial effusion. The pericardial effusion is posterior  and lateral to the left ventricle. There is no evidence of cardiac  tamponade. Moderate pleural effusion in the left lateral region.   4. Mild mitral valve regurgitation.   5. The aortic valve is tricuspid. Aortic valve regurgitation is trivial.   6. Aortic dilatation noted. There is mild dilatation of the ascending  aorta, measuring 41 mm.   7. The inferior vena cava is normal in size with <50% respiratory  variability, suggesting right atrial pressure of 8 mmHg.  __________   Limited echo  05/31/2021: 1. Left ventricular ejection fraction, by estimation, is 60 to 65%. The  left ventricle has normal function. The left ventricle has no regional  wall motion abnormalities. There is severe concentric left ventricular  hypertrophy. Left ventricular diastolic   parameters are indeterminate.   2. Right ventricular systolic function is normal. The right ventricular  size is normal. There is mildly elevated pulmonary artery systolic  pressure. The estimated right ventricular systolic pressure is 38.6 mmHg.   3. Left atrial size was moderately dilated.   4. Right atrial size was moderately dilated.   5. Moderate pericardial effusion in the posterior LV region, estimated  1.69 -2.00 cm, otherwise mild circumferential effusion No tamponade.   6. The mitral valve is normal in structure. Mild to moderate mitral valve  regurgitation. No evidence of mitral stenosis. Moderate mitral annular  calcification.   7. Tricuspid valve regurgitation is moderate.   8. The aortic valve is normal in structure. Aortic valve regurgitation is  mild. Aortic valve sclerosis/calcification is present, without any  evidence of aortic stenosis.   9. The inferior vena cava is normal in size with greater than 50%  respiratory variability, suggesting right atrial pressure of 3 mmHg.  10. There is mild dilatation of the ascending aorta, measuring 40 mm.  11. Rhythm apppears to be atrial fib/flutter. __________   Limited echo 08/17/2021: 1. Left ventricular ejection fraction, by estimation, is 55 to 60%. The  left ventricle has normal function. The left ventricle has no regional  wall motion abnormalities. There is severe left ventricular hypertrophy.  Left ventricular diastolic parameters   are indeterminate.   2. Right ventricular systolic function is normal. The right ventricular  size is normal. There is moderately elevated pulmonary artery systolic  pressure. The estimated right ventricular systolic  pressure is 50.3 mmHg.   3. Left atrial size was moderately dilated.   4. Right atrial size was moderately dilated.   5. The mitral valve is normal in structure. Mild to moderate mitral valve  regurgitation. No evidence of mitral stenosis.   6. Tricuspid valve regurgitation is moderate.   7. The aortic valve is tricuspid. Aortic valve regurgitation is not  visualized. No aortic stenosis is present.   8. The inferior vena cava is dilated in size with <50% respiratory  variability, suggesting right atrial pressure of 15 mmHg.   9. Moderate pericardial effusion , estimated 1.3 to 2.6 cm off the LV  free wall, mild to moderate off the RV free wall (1.6 cm). No tamponade  noted. __________   RHC 11/06/2021: Conclusions: Normal left and right heart filling pressures (mean RAP 5 mmHg, PCWP 15 mmHg). Borderline pulmonary hypertension (mean PAP 21 mmHg). Normal Fick cardiac output/index.   Recommendations: Continue medical therapy to maintain net even fluid balance. __________   Cardiac MRI 12/12/2021: IMPRESSION: 1. Normal left and right ventricular function.  LVEF 59%. 2. There is global transmural late gadolinium enhancement (LGE) in a heterogeneous pattern in the left ventricular myocardium. 3. There is mild circumferential pericardial effusion. 4. Moderate asymmetric left ventricular hypertrophy. 5. Severely dilated left atrium. 6. Findings consistent with infiltrative cardiomyopathy such as cardiac amyloidosis.   EKG:  EKG is ordered today.  The EKG ordered today demonstrates NSR, 72 bpm, baseline wandering, no acute ST-T changes  Recent Labs: 05/14/2022: Hemoglobin 13.6; Platelets 204.0; TSH 1.48 01/14/2023: ALT 22; BUN 13; Creatinine, Ser 0.61; Potassium 4.4; Sodium 141  Recent Lipid Panel    Component Value Date/Time   CHOL 202 (H) 01/14/2023 1514   TRIG 86.0 01/14/2023 1514   HDL 69.60 01/14/2023  1514   CHOLHDL 3 01/14/2023 1514   VLDL 17.2 01/14/2023 1514   LDLCALC 115  (H) 01/14/2023 1514   LDLDIRECT 104.0 08/12/2017 0932    PHYSICAL EXAM:    VS:  BP (!) 140/70 (BP Location: Left Arm, Patient Position: Sitting, Cuff Size: Normal)   Pulse 72   Ht 5' 7.5" (1.715 m)   Wt 143 lb 6 oz (65 kg)   LMP 06/26/1985   SpO2 95%   BMI 22.12 kg/m   BMI: Body mass index is 22.12 kg/m.  Physical Exam Vitals reviewed.  Constitutional:      Appearance: She is well-developed.  HENT:     Head: Normocephalic and atraumatic.  Eyes:     General:        Right eye: No discharge.        Left eye: No discharge.  Neck:     Vascular: No JVD.  Cardiovascular:     Rate and Rhythm: Normal rate and regular rhythm.     Pulses:          Posterior tibial pulses are 2+ on the right side and 2+ on the left side.     Heart sounds: S1 normal and S2 normal. Heart sounds not distant. No midsystolic click and no opening snap. Murmur heard.     Systolic murmur is present with a grade of 1/6 at the upper right sternal border.     No friction rub.  Pulmonary:     Effort: Pulmonary effort is normal. No respiratory distress.     Breath sounds: Normal breath sounds. No decreased breath sounds, wheezing, rhonchi or rales.  Chest:     Chest wall: No tenderness.  Abdominal:     General: There is no distension.  Musculoskeletal:     Cervical back: Normal range of motion.     Right lower leg: No edema.     Left lower leg: No edema.  Skin:    General: Skin is warm and dry.     Nails: There is no clubbing.  Neurological:     Mental Status: She is alert and oriented to person, place, and time.  Psychiatric:        Speech: Speech normal.        Behavior: Behavior normal.        Thought Content: Thought content normal.        Judgment: Judgment normal.     Wt Readings from Last 3 Encounters:  05/07/23 143 lb 6 oz (65 kg)  01/14/23 140 lb 3.2 oz (63.6 kg)  09/02/22 145 lb (65.8 kg)     Orthostatic vital signs: Lying: 157/87, 71 bpm Sitting: 135/76, 72 bpm Standing: 125/75,  74 bpm Standing time 3 minutes: 148/78, 76 bpm  ASSESSMENT & PLAN:   HFpEF with infiltrative cardiomyopathy concerning for cardiac amyloidosis/pulmonary hypertension: Euvolemic and well compensated.  Prior cardiac MRI did demonstrate infiltrative cardiomyopathy concerning for cardiac amyloidosis. This diagnosis has been discussed in detail with patient and daughter with preference to pursue conservative therapy with focusing on quality. Patient and family do not wish to pursue aggressive/invasive therapies or further testing. Given her age, and in the context of her being asymptomatic, this is reasonable. She has declined genetic testing.  No indication for PPM at this time.  RHC showed normal left and right heart filling pressures with borderline pulmonary hypertension.  No longer requiring standing loop diuretic.  Defer addition of spironolactone given advanced age and intermittent relative hypotension.  Persistent A-fib:  Maintaining sinus rhythm on Toprol-XL 12.5 mg.  CHA2DS2-VASc at least 5 (CHF, HTN, age x 2, sex category).  She remains on rivaroxaban 20 mg with a creatinine clearance of 66.3.  No falls or symptoms concerning for bleeding.  Pericardial effusion: Stable on recent echo and cardiac MRI without evidence of tamponade physiology.  Hemodynamically stable.  Aortic insufficiency/mitral regurgitation: Asymptomatic.  Monitor with periodic echo.  HTN: Blood pressure is overall reasonably controlled.  Remains on low-dose Toprol-XL as above.  Mildly dilated ascending aorta: Most recent echo demonstrated normal size and structure aortic.     Disposition: F/u with Dr. Okey Dupre or an APP in 6 months.   Medication Adjustments/Labs and Tests Ordered: Current medicines are reviewed at length with the patient today.  Concerns regarding medicines are outlined above. Medication changes, Labs and Tests ordered today are summarized above and listed in the Patient Instructions accessible in Encounters.    Signed, Eula Listen, PA-C 05/07/2023 5:03 PM      [facility] [address] [place], [place] [address] [phone]

## 2023-05-07 ENCOUNTER — Ambulatory Visit: Payer: Medicare Other | Attending: Physician Assistant | Admitting: Physician Assistant

## 2023-05-07 ENCOUNTER — Encounter: Payer: Self-pay | Admitting: Physician Assistant

## 2023-05-07 VITALS — BP 140/70 | HR 72 | Ht 67.5 in | Wt 143.4 lb

## 2023-05-07 DIAGNOSIS — I10 Essential (primary) hypertension: Secondary | ICD-10-CM

## 2023-05-07 DIAGNOSIS — I5032 Chronic diastolic (congestive) heart failure: Secondary | ICD-10-CM | POA: Diagnosis not present

## 2023-05-07 DIAGNOSIS — I272 Pulmonary hypertension, unspecified: Secondary | ICD-10-CM | POA: Diagnosis not present

## 2023-05-07 DIAGNOSIS — I4819 Other persistent atrial fibrillation: Secondary | ICD-10-CM | POA: Diagnosis not present

## 2023-05-07 DIAGNOSIS — I3139 Other pericardial effusion (noninflammatory): Secondary | ICD-10-CM

## 2023-05-07 DIAGNOSIS — I340 Nonrheumatic mitral (valve) insufficiency: Secondary | ICD-10-CM | POA: Diagnosis not present

## 2023-05-07 DIAGNOSIS — I428 Other cardiomyopathies: Secondary | ICD-10-CM | POA: Diagnosis not present

## 2023-05-07 NOTE — Patient Instructions (Signed)
Medication Instructions:  Your Physician recommend you continue on your current medication as directed.    *If you need a refill on your cardiac medications before your next appointment, please call your pharmacy*   Lab Work: None ordered at this time    Follow-Up: At [facility], you and your health needs are our priority.  As part of our continuing mission to provide you with exceptional heart care, we have created designated Provider Care Teams.  These Care Teams include your primary Cardiologist (physician) and Advanced Practice Providers (APPs -  Physician Assistants and Nurse Practitioners) who all work together to provide you with the care you need, when you need it.  We recommend signing up for the patient portal called "MyChart".  Sign up information is provided on this After Visit Summary.  MyChart is used to connect with patients for Virtual Visits (Telemedicine).  Patients are able to view lab/test results, encounter notes, upcoming appointments, etc.  Non-urgent messages can be sent to your provider as well.   To learn more about what you can do with MyChart, go to [email or web address].    Your next appointment:   6 month(s)  Provider:   You may see Yvonne Kendall, MD or one of the following Advanced Practice Providers on your designated Care Team:   Eula Listen, [place]

## 2023-05-16 ENCOUNTER — Ambulatory Visit (INDEPENDENT_AMBULATORY_CARE_PROVIDER_SITE_OTHER): Payer: Medicare Other | Admitting: Internal Medicine

## 2023-05-16 VITALS — BP 128/72 | HR 73 | Temp 97.4°F | Ht 67.5 in | Wt 141.2 lb

## 2023-05-16 DIAGNOSIS — I77810 Thoracic aortic ectasia: Secondary | ICD-10-CM

## 2023-05-16 DIAGNOSIS — E7800 Pure hypercholesterolemia, unspecified: Secondary | ICD-10-CM | POA: Diagnosis not present

## 2023-05-16 DIAGNOSIS — Z23 Encounter for immunization: Secondary | ICD-10-CM

## 2023-05-16 DIAGNOSIS — I4891 Unspecified atrial fibrillation: Secondary | ICD-10-CM | POA: Diagnosis not present

## 2023-05-16 DIAGNOSIS — D696 Thrombocytopenia, unspecified: Secondary | ICD-10-CM | POA: Diagnosis not present

## 2023-05-16 DIAGNOSIS — M5441 Lumbago with sciatica, right side: Secondary | ICD-10-CM

## 2023-05-16 DIAGNOSIS — M4800 Spinal stenosis, site unspecified: Secondary | ICD-10-CM

## 2023-05-16 DIAGNOSIS — I5030 Unspecified diastolic (congestive) heart failure: Secondary | ICD-10-CM | POA: Diagnosis not present

## 2023-05-16 DIAGNOSIS — R2681 Unsteadiness on feet: Secondary | ICD-10-CM | POA: Insufficient documentation

## 2023-05-16 LAB — CBC WITH DIFFERENTIAL/PLATELET
Basophils Absolute: 0 10*3/uL (ref 0.0–0.1)
Basophils Relative: 0.7 % (ref 0.0–3.0)
Eosinophils Absolute: 0.2 10*3/uL (ref 0.0–0.7)
Eosinophils Relative: 3 % (ref 0.0–5.0)
HCT: 40.2 % (ref 36.0–46.0)
Hemoglobin: 13.3 g/dL (ref 12.0–15.0)
Lymphocytes Relative: 26 % (ref 12.0–46.0)
Lymphs Abs: 1.5 10*3/uL (ref 0.7–4.0)
MCHC: 33.1 g/dL (ref 30.0–36.0)
MCV: 97.4 fL (ref 78.0–100.0)
Monocytes Absolute: 0.5 10*3/uL (ref 0.1–1.0)
Monocytes Relative: 9.5 % (ref 3.0–12.0)
Neutro Abs: 3.4 10*3/uL (ref 1.4–7.7)
Neutrophils Relative %: 60.8 % (ref 43.0–77.0)
Platelets: 209 10*3/uL (ref 150.0–400.0)
RBC: 4.13 Mil/uL (ref 3.87–5.11)
RDW: 13 % (ref 11.5–15.5)
WBC: 5.6 10*3/uL (ref 4.0–10.5)

## 2023-05-16 LAB — BASIC METABOLIC PANEL
BUN: 15 mg/dL (ref 6–23)
CO2: 31 meq/L (ref 19–32)
Calcium: 9.8 mg/dL (ref 8.4–10.5)
Chloride: 103 meq/L (ref 96–112)
Creatinine, Ser: 0.58 mg/dL (ref 0.40–1.20)
GFR: 81.43 mL/min (ref >60.00)
Glucose, Bld: 93 mg/dL (ref 70–99)
Potassium: 5 meq/L (ref 3.5–5.1)
Sodium: 141 meq/L (ref 135–145)

## 2023-05-16 LAB — LIPID PANEL
Cholesterol: 169 mg/dL (ref 0–200)
HDL: 64.4 mg/dL (ref >39.00)
LDL Cholesterol: 88 mg/dL (ref 0–99)
NonHDL: 104.6
Total CHOL/HDL Ratio: 3
Triglycerides: 82 mg/dL (ref 0.0–149.0)
VLDL: 16.4 mg/dL (ref 0.0–40.0)

## 2023-05-16 LAB — HEPATIC FUNCTION PANEL
ALT: 34 U/L (ref 0–35)
AST: 26 U/L (ref 0–37)
Albumin: 3.7 g/dL (ref 3.5–5.2)
Alkaline Phosphatase: 70 U/L (ref 39–117)
Bilirubin, Direct: 0.1 mg/dL (ref 0.0–0.3)
Total Bilirubin: 0.6 mg/dL (ref 0.2–1.2)
Total Protein: 6.6 g/dL (ref 6.0–8.3)

## 2023-05-16 LAB — TSH: TSH: 1.86 u[IU]/mL (ref 0.35–5.50)

## 2023-05-16 NOTE — Progress Notes (Unsigned)
Subjective:    Patient ID: Deborah Baxter, female    DOB: Sep 27, 1933, 89 y.o.   MRN: [id number]  Patient here for  Chief Complaint  Patient presents with   Follow-up    HPI Here for a scheduled follow up - follow up regarding  afib, HFpEF, hypertension and hypercholesterolemia. She is accompanied by her daughter. History obtained from both of them. Had f/u with cardiology 05/07/23 - stable.  No changes made. She reports her breathing is stable. Denies chest pain. Eating. No abdominal pain or bowel change reported. Has noticed some balance issues - unsteady gait. Discussed PT - to evaluate and treat.    Past Medical History:  Diagnosis Date   (HFpEF) heart failure with preserved ejection fraction (HCC) 04/24/2021   Pulmonary raised concern - question of pulmonary hypertension secondary to DD.  Question of need for right heart cath   A-fib [facility]) 04/24/2021   Acute heart failure with preserved ejection fraction (HCC)    Ascending aorta dilatation (HCC) 08/01/2021   Atrial fibrillation with rapid ventricular response (HCC) 03/20/2021   Congestion of nasal sinus 10/13/2021   Cough 04/23/2021   Diverticulosis 09/05/2012   Colonoscopy 08/31/12 - redundant colon and diverticulosis in the sigmoid colon, descending colon, transverse colon and hepatic flexure.  Recommend f/u colonoscopy in 10 years.    Elevated bilirubin 09/13/2021   Elevated blood pressure reading 12/05/2020   Health care maintenance 07/13/2014   Physical 07/12/14.  Colonoscopy 08/31/12.  Normal bone density 04/06/10.  Mammogram 05/30/20- Birads I.     Hx of degenerative disc disease    Hypercholesterolemia    Hypertensive urgency 03/20/2021   Ingrown toenail 09/13/2021   Left hand pain 03/20/2021   Low back pain 01/29/2021   Lumbago 07/13/2014   Lumbar spinal stenosis 06/27/2012   Neuromuscular disorder (HCC)    pinched nerve   Numbness and tingling in left hand 08/21/2017   Osteoarthritis    Pleural effusion 09/13/2021   Pulmonary  edema 03/20/2021   Seasonal allergies    Spinal stenosis    chronic back and leg pain   Stress 02/03/2021   Syncope 12/05/2020   Thrombocytopenia (HCC) 09/13/2021   Past Surgical History:  Procedure Laterality Date   APPENDECTOMY     bladder tack surgery  2000   BTL  1976   CARDIOVERSION N/A 05/11/2021   Procedure: CARDIOVERSION;  Surgeon: Antonieta Iba, MD;  Location: [facility] ORS;  Service: Cardiovascular;  Laterality: N/A;   CARPAL TUNNEL RELEASE     CATARACT EXTRACTION W/PHACO Left 12/07/2015   Procedure: CATARACT EXTRACTION PHACO AND INTRAOCULAR LENS PLACEMENT (IOC);  Surgeon: Nevada Crane, MD;  Location: [facility] ORS;  Service: Ophthalmology;  Laterality: Left;  [place] 1.23 AP% 23.2 CDE 19.28 Fluid Pack Lot # [id number]   EYE SURGERY  03/19/15   GANGLION CYST EXCISION     right hand   GAS/FLUID EXCHANGE Left 03/08/2015   Procedure: GAS/FLUID EXCHANGE;  Surgeon: Marcelene Butte, MD;  Location: [facility] ORS;  Service: Ophthalmology;  Laterality: Left;   RIGHT HEART CATH N/A 11/06/2021   Procedure: RIGHT HEART CATH;  Surgeon: Yvonne Kendall, MD;  Location: [facility] INVASIVE CV LAB;  Service: Cardiovascular;  Laterality: N/A;   TONSILLECTOMY     TUBAL LIGATION     VITRECTOMY 25 GAUGE WITH SCLERAL BUCKLE Left 03/08/2015   Procedure: VITRECTOMY 25 GAUGE, membrane peel, 26 %  SF6 gas exchange;  Surgeon: Marcelene Butte, MD;  Location: [facility] ORS;  Service: Ophthalmology;  Laterality: Left;  casette lot # [id number]   Family History  Problem Relation Age of Onset   Diabetes Mother    Heart disease Father        Unknown age of onset. Unknown if had CHF or heart attack.   Leukemia Father    Breast cancer Sister        in her 50's   Cancer Sister        lung cancer   Thyroid cancer Sister    Breast cancer Maternal Aunt    Colon cancer Neg Hx    Social History   Socioeconomic History   Marital status: Widowed    Spouse name: Not on file   Number of children: 5   Years of education: Not on file    Highest education level: Some college, no degree  Occupational History   Not on file  Tobacco Use   Smoking status: Former    Current packs/day: 0.00    Average packs/day: 0.3 packs/day for 10.0 years (2.5 ttl pk-yrs)    Types: Cigarettes    Start date: 40    Quit date: 62    Years since quitting: 51.9   Smokeless tobacco: Never  Vaping Use   Vaping status: Never Used  Substance and Sexual Activity   Alcohol use: Not Currently    Comment: occassional   Drug use: No   Sexual activity: Not on file  Other Topics Concern   Not on file  Social History Narrative   Not on file   Social Drivers of Health   Financial Resource Strain: Low Risk  (05/15/2023)   Overall Financial Resource Strain (CARDIA)    Difficulty of Paying Living Expenses: Not hard at all  Food Insecurity: No Food Insecurity (05/15/2023)   Hunger Vital Sign    Worried About Running Out of Food in the Last Year: Never true    Ran Out of Food in the Last Year: Never true  Transportation Needs: No Transportation Needs (05/15/2023)   PRAPARE - Administrator, Civil Service (Medical): No    Lack of Transportation (Non-Medical): No  Physical Activity: Unknown (05/15/2023)   Exercise Vital Sign    Days of Exercise per Week: Patient declined    Minutes of Exercise per Session: 20 min  Stress: No Stress Concern Present (05/15/2023)   [employer] of Occupational Health - Occupational Stress Questionnaire    Feeling of Stress : Not at all  Social Connections: Moderately Integrated (05/15/2023)   Social Connection and Isolation Panel [NHANES]    Frequency of Communication with Friends and Family: More than three times a week    Frequency of Social Gatherings with Friends and Family: Twice a week    Attends Religious Services: More than 4 times per year    Active Member of [employer] or Organizations: Yes    Attends Banker Meetings: More than 4 times per year    Marital Status: Widowed      Review of Systems  Constitutional:  Negative for appetite change and unexpected weight change.  HENT:  Negative for congestion and sinus pressure.   Respiratory:  Negative for cough, chest tightness and shortness of breath.   Cardiovascular:  Negative for chest pain, palpitations and leg swelling.  Gastrointestinal:  Negative for abdominal pain, diarrhea, nausea and vomiting.  Genitourinary:  Negative for difficulty urinating and dysuria.  Musculoskeletal:  Negative for joint swelling and myalgias.  Skin:  Negative for color change and rash.  Neurological:  Negative  for dizziness and headaches.  Psychiatric/Behavioral:  Negative for agitation and dysphoric mood.        Objective:     BP 128/72   Pulse 73   Temp (!) 97.4 F (36.3 C)   Ht 5' 7.5" (1.715 m)   Wt 141 lb 3.2 oz (64 kg)   LMP 06/26/1985   SpO2 96%   BMI 21.79 kg/m  Wt Readings from Last 3 Encounters:  05/16/23 141 lb 3.2 oz (64 kg)  05/07/23 143 lb 6 oz (65 kg)  01/14/23 140 lb 3.2 oz (63.6 kg)    Physical Exam Vitals reviewed.  Constitutional:      General: She is not in acute distress.    Appearance: Normal appearance.  HENT:     Head: Normocephalic and atraumatic.     Right Ear: External ear normal.     Left Ear: External ear normal.  Eyes:     General: No scleral icterus.       Right eye: No discharge.        Left eye: No discharge.     Conjunctiva/sclera: Conjunctivae normal.  Neck:     Thyroid: No thyromegaly.  Cardiovascular:     Rate and Rhythm: Normal rate and regular rhythm.  Pulmonary:     Effort: No respiratory distress.     Breath sounds: Normal breath sounds. No wheezing.  Abdominal:     General: Bowel sounds are normal.     Palpations: Abdomen is soft.     Tenderness: There is no abdominal tenderness.  Musculoskeletal:        General: No swelling or tenderness.     Cervical back: Neck supple. No tenderness.  Lymphadenopathy:     Cervical: No cervical adenopathy.  Skin:     Findings: No erythema or rash.  Neurological:     Mental Status: She is alert.  Psychiatric:        Mood and Affect: Mood normal.        Behavior: Behavior normal.      Outpatient Encounter Medications as of 05/16/2023  Medication Sig   acetaminophen (TYLENOL) 650 MG CR tablet Take 650 mg by mouth every 8 (eight) hours as needed for pain. Reported on 08/02/2015   metoprolol succinate (TOPROL XL) 25 MG 24 hr tablet Take 0.5 tablets (12.5 mg total) by mouth daily.   Multiple Vitamin (MULTIVITAMIN) tablet Take 1 tablet by mouth daily.   OVER THE COUNTER MEDICATION Place 1 drop into both eyes every 8 (eight) hours as needed (dry eyes.). Lubricant eye drops   Polyethylene Glycol 3350 (MIRALAX PO) Take 17 g by mouth every 3 (three) days. Every 3 days   rivaroxaban (XARELTO) 20 MG TABS tablet Take 1 tablet (20 mg total) by mouth daily with supper.   Facility-Administered Encounter Medications as of 05/16/2023  Medication   sodium chloride flush (NS) 0.9 % injection 3 mL     Lab Results  Component Value Date   WBC 5.6 05/16/2023   HGB 13.3 05/16/2023   HCT 40.2 05/16/2023   PLT 209.0 05/16/2023   GLUCOSE 93 05/16/2023   CHOL 169 05/16/2023   TRIG 82.0 05/16/2023   HDL 64.40 05/16/2023   LDLDIRECT 104.0 08/12/2017   LDLCALC 88 05/16/2023   ALT 34 05/16/2023   AST 26 05/16/2023   NA 141 05/16/2023   K 5.0 05/16/2023   CL 103 05/16/2023   CREATININE 0.58 05/16/2023   BUN 15 05/16/2023   CO2 31 05/16/2023   TSH  1.86 05/16/2023   INR 1.2 03/20/2021    Mammogram 3D SCREEN BREAST BILATERAL Result Date: 12/30/2022 CLINICAL DATA:  Screening. EXAM: DIGITAL SCREENING BILATERAL MAMMOGRAM WITH TOMOSYNTHESIS AND CAD TECHNIQUE: Bilateral screening digital craniocaudal and mediolateral oblique mammograms were obtained. Bilateral screening digital breast tomosynthesis was performed. The images were evaluated with computer-aided detection. COMPARISON:  Previous exam(s). ACR Breast Density  Category b: There are scattered areas of fibroglandular density. FINDINGS: There are no findings suspicious for malignancy. IMPRESSION: No mammographic evidence of malignancy. A result letter of this screening mammogram will be mailed directly to the patient. RECOMMENDATION: Screening mammogram in one year. (Code:SM-B-01Y) BI-RADS CATEGORY  1: Negative. Electronically Signed   By: Sherian Rein M.D.   On: 12/30/2022 12:10       Assessment & Plan:  Hypercholesterolemia Assessment & Plan: Low cholesterol diet and exercise.  Have discussed calculated cholesterol risk and recommendation to start cholesterol medication.  She declines.  Follow lipid panel.    Orders: -     Lipid panel -     CBC with Differential/Platelet -     Basic metabolic panel -     Hepatic function panel -     TSH  Unsteady gait Assessment & Plan: Reported some balance issues and unsteady gait.  Refer to PT - to evaluate and treat.   Orders: -     Ambulatory referral to Physical Therapy  Right-sided low back pain with right-sided sciatica, unspecified chronicity -     Ambulatory referral to Physical Therapy  Need for influenza vaccination -     Flu Vaccine Trivalent High Dose (Fluad)  Thrombocytopenia (HCC) Assessment & Plan: Abdominal ultrasound revealed spleen size - wnl.  hgb wnl.  Follow cbc.    Spinal stenosis, unspecified spinal region Assessment & Plan: Chronic back and leg pain.   Has seen physiatry. Stable. Follow.    Ascending aorta dilatation (HCC) Assessment & Plan:  Continue adequate blood pressure control.  Follow.    Atrial fibrillation, unspecified type [facility]) Assessment & Plan: Continues on metoprolol and xarelto.  Stable. SR on exam. Follow.     Heart failure with preserved ejection fraction, unspecified HF chronicity (HCC) Assessment & Plan: Breathing stable. No increased sob.   No evidence of volume overload today on exam. On metoprolol. Off lasix.  Weight stable. Follow.  Follow   metabolic panel. Follow pressures.       Dale Dickinson, MD

## 2023-05-17 ENCOUNTER — Encounter: Payer: Self-pay | Admitting: Internal Medicine

## 2023-05-17 NOTE — Assessment & Plan Note (Signed)
Breathing stable. No increased sob.   No evidence of volume overload today on exam. On metoprolol. Off lasix.  Weight stable. Follow.  Follow  metabolic panel. Follow pressures.

## 2023-05-17 NOTE — Assessment & Plan Note (Signed)
Reported some balance issues and unsteady gait.  Refer to PT - to evaluate and treat.

## 2023-05-17 NOTE — Assessment & Plan Note (Signed)
Abdominal ultrasound revealed spleen size - wnl.  hgb wnl.  Follow cbc.  

## 2023-05-17 NOTE — Assessment & Plan Note (Addendum)
Continues on metoprolol and xarelto.  Stable. SR on exam. Follow.

## 2023-05-17 NOTE — Assessment & Plan Note (Signed)
Chronic back and leg pain.   Has seen physiatry. Stable. Follow.

## 2023-05-17 NOTE — Assessment & Plan Note (Signed)
Continue adequate blood pressure control.  Follow.

## 2023-05-17 NOTE — Assessment & Plan Note (Signed)
Low cholesterol diet and exercise.  Have discussed calculated cholesterol risk and recommendation to start cholesterol medication.  She declines.  Follow lipid panel.

## 2023-05-19 ENCOUNTER — Other Ambulatory Visit: Payer: Self-pay

## 2023-05-19 DIAGNOSIS — R030 Elevated blood-pressure reading, without diagnosis of hypertension: Secondary | ICD-10-CM

## 2023-05-19 DIAGNOSIS — E7800 Pure hypercholesterolemia, unspecified: Secondary | ICD-10-CM

## 2023-05-29 ENCOUNTER — Telehealth: Payer: Self-pay | Admitting: Internal Medicine

## 2023-05-29 DIAGNOSIS — I5030 Unspecified diastolic (congestive) heart failure: Secondary | ICD-10-CM

## 2023-05-29 NOTE — Telephone Encounter (Signed)
Patient need lab orders.

## 2023-05-29 NOTE — Telephone Encounter (Signed)
Order placed for f/u stat potassium.  

## 2023-06-02 ENCOUNTER — Other Ambulatory Visit (INDEPENDENT_AMBULATORY_CARE_PROVIDER_SITE_OTHER): Payer: Medicare Other

## 2023-06-02 DIAGNOSIS — I5030 Unspecified diastolic (congestive) heart failure: Secondary | ICD-10-CM | POA: Diagnosis not present

## 2023-06-02 LAB — POTASSIUM: Potassium: 5.3 meq/L — ABNORMAL HIGH (ref 3.5–5.1)

## 2023-06-03 ENCOUNTER — Telehealth: Payer: Self-pay

## 2023-06-03 ENCOUNTER — Other Ambulatory Visit: Payer: Self-pay

## 2023-06-03 DIAGNOSIS — E875 Hyperkalemia: Secondary | ICD-10-CM

## 2023-06-03 NOTE — Telephone Encounter (Signed)
 Called patient to let her know that she needs to have potassium rechecked next Monday or Tuesday.

## 2023-06-03 NOTE — Telephone Encounter (Signed)
 Copied from CRM [id number]. Topic: Appointments - Appointment Info/Confirmation >> Jun 03, 2023 12:04 PM Rosina D wrote: Patient/patient representative is calling for information regarding an appointment. Pt called stating someone call her in regards to having a blood test done at the hospital and she forgot the time and day to go

## 2023-06-10 ENCOUNTER — Other Ambulatory Visit
Admission: RE | Admit: 2023-06-10 | Discharge: 2023-06-10 | Disposition: A | Discharge status: Home / Self Care | Payer: Medicare Other | Attending: Internal Medicine | Admitting: Internal Medicine

## 2023-06-10 DIAGNOSIS — E875 Hyperkalemia: Secondary | ICD-10-CM | POA: Insufficient documentation

## 2023-06-10 LAB — POTASSIUM: Potassium: 4.9 mmol/L (ref 3.5–5.1)

## 2023-07-18 ENCOUNTER — Other Ambulatory Visit: Payer: Self-pay | Admitting: Physician Assistant

## 2023-07-18 NOTE — Telephone Encounter (Signed)
Prescription refill request for Xarelto received.  Indication:afib Last office visit:12/24 Weight:64  kg Age:89 Scr:0.58  12/24 CrCl:69.04  ml/min  Prescription refilled

## 2023-07-18 NOTE — Telephone Encounter (Signed)
Refill request

## 2023-07-29 ENCOUNTER — Ambulatory Visit (INDEPENDENT_AMBULATORY_CARE_PROVIDER_SITE_OTHER): Payer: Medicare Other | Admitting: Podiatry

## 2023-07-29 DIAGNOSIS — M79675 Pain in left toe(s): Secondary | ICD-10-CM | POA: Diagnosis not present

## 2023-07-29 DIAGNOSIS — B351 Tinea unguium: Secondary | ICD-10-CM

## 2023-07-29 DIAGNOSIS — M79674 Pain in right toe(s): Secondary | ICD-10-CM

## 2023-07-29 NOTE — Progress Notes (Signed)
  Subjective:  Patient ID: Deborah Baxter, female    DOB: 12/06/1933,  MRN: [id number]  Chief Complaint  Patient presents with   Nail Problem    Nail trim    89 y.o. female returns for the above complaint.  Patient presents with thickened elongated dystrophic toenails x10.  Mild pain on palpation.  Patient states that she is not able to do it herself.  She would like for me to debride down.  She denies any other acute complaints  Objective:  There were no vitals filed for this visit. Podiatric Exam: Vascular: dorsalis pedis and posterior tibial pulses are palpable bilateral. Capillary return is immediate. Temperature gradient is WNL. Skin turgor WNL  Sensorium: Normal Semmes Weinstein monofilament test. Normal tactile sensation bilaterally. Nail Exam: Pt has thick disfigured discolored nails with subungual debris noted bilateral entire nail hallux through fifth toenails.  Pain on palpation to the nails. Ulcer Exam: There is no evidence of ulcer or pre-ulcerative changes or infection. Orthopedic Exam: Muscle tone and strength are WNL. No limitations in general ROM. No crepitus or effusions noted.  Skin: No Porokeratosis. No infection or ulcers    Assessment & Plan:   No diagnosis found.      Patient was evaluated and treated and all questions answered.  Onychomycosis with pain  -Nails palliatively debrided as below. -Educated on self-care  Procedure: Nail Debridement Rationale: pain  Type of Debridement: manual, sharp debridement. Instrumentation: Nail nipper, rotary burr. Number of Nails: 10  Procedures and Treatment: Consent by patient was obtained for treatment procedures. The patient understood the discussion of treatment and procedures well. All questions were answered thoroughly reviewed. Debridement of mycotic and hypertrophic toenails, 1 through 5 bilateral and clearing of subungual debris. No ulceration, no infection noted.  Return Visit-Office Procedure: Patient  instructed to return to the office for a follow up visit 3 months for continued evaluation and treatment.  Nicholes Rough, DPM    No follow-ups on file.

## 2023-08-06 ENCOUNTER — Ambulatory Visit: Payer: Medicare Other | Attending: Internal Medicine

## 2023-08-06 DIAGNOSIS — R262 Difficulty in walking, not elsewhere classified: Secondary | ICD-10-CM | POA: Insufficient documentation

## 2023-08-06 DIAGNOSIS — M6281 Muscle weakness (generalized): Secondary | ICD-10-CM | POA: Diagnosis not present

## 2023-08-06 DIAGNOSIS — M5459 Other low back pain: Secondary | ICD-10-CM | POA: Diagnosis not present

## 2023-08-06 DIAGNOSIS — M5441 Lumbago with sciatica, right side: Secondary | ICD-10-CM | POA: Insufficient documentation

## 2023-08-06 DIAGNOSIS — R2681 Unsteadiness on feet: Secondary | ICD-10-CM | POA: Diagnosis not present

## 2023-08-06 NOTE — Therapy (Signed)
 OUTPATIENT PHYSICAL THERAPY THORACOLUMBAR EVALUATION   Patient Name: Deborah Baxter MRN: [id number] DOB:[birth year], 89 y.o., female Today's Date: 08/06/2023  END OF SESSION:  PT End of Session - 08/06/23 0826     Visit Number 1    Number of Visits 17    Date for PT Re-Evaluation 10/03/23    PT Start Time 0827   Pt arrived late   PT Stop Time 0909    PT Time Calculation (min) 42 min    Activity Tolerance Patient tolerated treatment well    Behavior During Therapy [facility] for tasks assessed/performed             Past Medical History:  Diagnosis Date   (HFpEF) heart failure with preserved ejection fraction (HCC) 04/24/2021   Pulmonary raised concern - question of pulmonary hypertension secondary to DD.  Question of need for right heart cath   A-fib [facility]) 04/24/2021   Acute heart failure with preserved ejection fraction (HCC)    Ascending aorta dilatation (HCC) 08/01/2021   Atrial fibrillation with rapid ventricular response (HCC) 03/20/2021   Congestion of nasal sinus 10/13/2021   Cough 04/23/2021   Diverticulosis 09/05/2012   Colonoscopy 08/31/12 - redundant colon and diverticulosis in the sigmoid colon, descending colon, transverse colon and hepatic flexure.  Recommend f/u colonoscopy in 10 years.    Elevated bilirubin 09/13/2021   Elevated blood pressure reading 12/05/2020   Health care maintenance 07/13/2014   Physical 07/12/14.  Colonoscopy 08/31/12.  Normal bone density 04/06/10.  Mammogram 05/30/20- Birads I.     Hx of degenerative disc disease    Hypercholesterolemia    Hypertensive urgency 03/20/2021   Ingrown toenail 09/13/2021   Left hand pain 03/20/2021   Low back pain 01/29/2021   Lumbago 07/13/2014   Lumbar spinal stenosis 06/27/2012   Neuromuscular disorder (HCC)    pinched nerve   Numbness and tingling in left hand 08/21/2017   Osteoarthritis    Pleural effusion 09/13/2021   Pulmonary edema 03/20/2021   Seasonal allergies    Spinal stenosis    chronic back and leg pain    Stress 02/03/2021   Syncope 12/05/2020   Thrombocytopenia (HCC) 09/13/2021   Past Surgical History:  Procedure Laterality Date   APPENDECTOMY     bladder tack surgery  2000   BTL  1976   CARDIOVERSION N/A 05/11/2021   Procedure: CARDIOVERSION;  Surgeon: Antonieta Iba, MD;  Location: [facility] ORS;  Service: Cardiovascular;  Laterality: N/A;   CARPAL TUNNEL RELEASE     CATARACT EXTRACTION W/PHACO Left 12/07/2015   Procedure: CATARACT EXTRACTION PHACO AND INTRAOCULAR LENS PLACEMENT (IOC);  Surgeon: Nevada Crane, MD;  Location: [facility] ORS;  Service: Ophthalmology;  Laterality: Left;  [place] 1.23 AP% 23.2 CDE 19.28 Fluid Pack Lot # [id number]   EYE SURGERY  03/19/15   GANGLION CYST EXCISION     right hand   GAS/FLUID EXCHANGE Left 03/08/2015   Procedure: GAS/FLUID EXCHANGE;  Surgeon: Marcelene Butte, MD;  Location: [facility] ORS;  Service: Ophthalmology;  Laterality: Left;   RIGHT HEART CATH N/A 11/06/2021   Procedure: RIGHT HEART CATH;  Surgeon: Yvonne Kendall, MD;  Location: [facility] INVASIVE CV LAB;  Service: Cardiovascular;  Laterality: N/A;   TONSILLECTOMY     TUBAL LIGATION     VITRECTOMY 25 GAUGE WITH SCLERAL BUCKLE Left 03/08/2015   Procedure: VITRECTOMY 25 GAUGE, membrane peel, 26 %  SF6 gas exchange;  Surgeon: Marcelene Butte, MD;  Location: [facility] ORS;  Service: Ophthalmology;  Laterality: Left;  casette lot # [id number]   Patient Active Problem List   Diagnosis Date Noted   Unsteady gait 05/16/2023   Arm pain 08/18/2022   Hx of degenerative disc disease 10/23/2021   Osteoarthritis 10/23/2021   Seasonal allergies 10/23/2021   Spinal stenosis 10/23/2021   Elevated bilirubin 09/13/2021   Thrombocytopenia (HCC) 09/13/2021   Ingrown toenail 09/13/2021   Ascending aorta dilatation (HCC) 08/01/2021   A-fib (HCC) 04/24/2021   (HFpEF) heart failure with preserved ejection fraction (HCC) 04/24/2021   Acute heart failure with preserved ejection fraction (HCC)    Atrial fibrillation with rapid  ventricular response (HCC) 03/20/2021   Left hand pain 03/20/2021   Stress 02/03/2021   Low back pain 01/29/2021   Syncope 12/05/2020   Elevated blood pressure reading 12/05/2020   Numbness and tingling in left hand 08/21/2017   Lumbago 07/13/2014   Health care maintenance 07/13/2014   Diverticulosis 09/05/2012   Hypercholesterolemia 06/27/2012   Lumbar spinal stenosis 06/27/2012    PCP: Dale [place], MD  REFERRING PROVIDER: Dale [place], MD  REFERRING DIAG: R26.81 (ICD-10-CM) - Unsteady [phone] (ICD-10-CM) - Right-sided low back pain with right-sided sciatica, unspecified chronicity  Rationale for Evaluation and Treatment: Rehabilitation  THERAPY DIAG:  Other low back pain - Plan: PT plan of care cert/re-cert  Difficulty in walking, not elsewhere classified - Plan: PT plan of care cert/re-cert  Unsteadiness on feet - Plan: PT plan of care cert/re-cert  Muscle weakness (generalized) - Plan: PT plan of care cert/re-cert  ONSET DATE: 05/16/2023 (date PT referral was signed)  SUBJECTIVE:                                                                                                                                                                                           SUBJECTIVE STATEMENT: R low back/posterior hip. 4/10 currently (pt sitting on a chair), 4/10 when walking from waiting room to treatment room. 7/10 at most for the past 3 months.    PERTINENT HISTORY:  R sided low back pain, and unsteady gait. Chronic, which progressively worsened over the years. Has gone to a chiropractor for a while which stopped helping. Has not had back surgery. No osteoporosis to her knowledge. Balance is also not as good, such as when standing. Larey Seat a long time ago 50 years ago which bothered her R low back. Back pain has remained the same since several years ago. Feels R LE radiating symptoms to her foot (L5 dermatome on R)   Blood pressure is controlled per pt.      PAIN:   Are you having pain? Yes: NPRS scale: 4/10 Pain location: R low  back/posterior hip Pain description: catching; dull pain Aggravating factors: weather, picking up sticks in the yard (daughter states pt has a very high tolerance for pain) Relieving factors: ice pack, sitting eases back pain, tylenol  PRECAUTIONS: Fall risk  RED FLAGS: Bowel or bladder incontinence: Yes: Bladder, doctor Scott aware. and Cauda equina syndrome: No   WEIGHT BEARING RESTRICTIONS: No  FALLS:  Has patient fallen in last 6 months? No  LIVING ENVIRONMENT: Lives with: lives alone Lives in: House/apartment Stairs: Yes: Internal: about 12 to the basement to do laundry 2x/week steps; L rail going down to the basement and External: 2 steps; on right going up Has following equipment at home: Single point cane and Shower bench  OCCUPATION: retired  PLOF: Requires assistive device for independence  PATIENT GOALS: Be without the pain. Improve balance.   NEXT MD VISIT: Yes but pt does not know. Has a wellness visit.   OBJECTIVE:  Note: Objective measures were completed at Evaluation unless otherwise noted.  DIAGNOSTIC FINDINGS:  DG Lumbar Spine 2-3 Views 01/30/2024  Narrative & Impression  CLINICAL DATA:  Low back pain, radiating down right leg   EXAM: LUMBAR SPINE - 2-3 VIEW   COMPARISON:  2019   FINDINGS: Alignment is similar with levoscoliosis. Diffusely decreased osseous mineralization in combination with overlying bowel gas limits evaluation. Vertebral body heights appear similar. Multilevel disc space narrowing, endplate osteophytes, and facet hypertrophy.   IMPRESSION: Suboptimal evaluation due to demineralization. Multilevel spondylosis superimposed on scoliosis similar to prior study.     Electronically Signed   By: Guadlupe Spanish M.D.   On: 01/30/2021 09:09    PATIENT SURVEYS:  Modified Oswestry 9/50 (18%) (08/06/2023)   COGNITION: Overall cognitive status: Within functional limits  for tasks assessed     SENSATION:   MUSCLE LENGTH:    POSTURE: forward neck B protracted shoulders, R shoulder lower, forward flexed, R iliac crest and greater trochanter higher, L lumbar convexity around L3, L knee in slight flexion, R knee genu valgus.   PALPATION: TTP to R posterior hip/piriformis (proximal attachment and muscle belly), TTP around R posterior hip around Sciatic nerve.  LUMBAR ROM:   AROM eval  Flexion WFL with R low back pain  Extension Limited with low back pain  Right lateral flexion WFL  Left lateral flexion WFL  Right rotation WFL with R low back pain  Left rotation WFL   (Blank rows = not tested)  LOWER EXTREMITY ROM:     Passive  Right eval Left eval  Hip flexion    Hip extension    Hip abduction    Hip adduction    Hip internal rotation    Hip external rotation    Knee flexion    Knee extension    Ankle dorsiflexion    Ankle plantarflexion    Ankle inversion    Ankle eversion     (Blank rows = not tested)  LOWER EXTREMITY MMT:    MMT Right eval Left eval  Hip flexion 4- 4-  Hip extension (seated manually resisted) 3 3+  Hip abduction (seated manually resisted) 4 4  Hip adduction    Hip internal rotation    Hip external rotation 4 4-  Knee flexion 4 4  Knee extension 4+ 5  Ankle dorsiflexion    Ankle plantarflexion    Ankle inversion    Ankle eversion     (Blank rows = not tested)  LUMBAR SPECIAL TESTS:    FUNCTIONAL TESTS:  5 times  sit to stand: unable to test at this time Timed up and go (TUG): 18.24 seconds; 17.24 s, 17.88 seconds with SPC on L; (17.8 seconds average)  Dynamic Gait Index: unable to test at this time Decreased femoral control R with STS   GAIT: Distance walked: 30 ft Assistive device utilized: Single point cane Level of assistance: Modified independence Comments: Antalic, decreased stance R LE, SPC on R, R knee genu valgus during stance phase of gait  TREATMENT DATE: 08/06/2023                                                                                                                                  PATIENT EDUCATION:  Education details: POC Person educated: Patient, Child(ren), and Estate manager/land agent Education method: Explanation Education comprehension: verbalized understanding  HOME EXERCISE PROGRAM:   ASSESSMENT:  CLINICAL IMPRESSION: Patient is a 89 y.o. female who was seen today for physical therapy evaluation and treatment for R low back pain and decreased balance. She also presents with altered gait pattern and posture, decreased B femoral control R > L, decreased trunk and hip strength, TTP R posterior hip, decreased functional mobility and balance, and difficulty ambulating as well as performing standing tasks secondary back pain and difficulty with balance. Pt will benefit from skilled physical therapy services to address the aforementioned deficits.       OBJECTIVE IMPAIRMENTS: Abnormal gait, decreased balance, difficulty walking, decreased strength, improper body mechanics, postural dysfunction, and pain.   ACTIVITY LIMITATIONS: carrying, lifting, bending, standing, squatting, stairs, transfers, and locomotion level  PARTICIPATION LIMITATIONS:   PERSONAL FACTORS: Age, Fitness, Time since onset of injury/illness/exacerbation, and 3+ comorbidities: heart failure, A-fib, ascending aorta dilation, osteoarthritis, syncope  are also ; affecting patient's functional outcome.   REHAB POTENTIAL: Fair    CLINICAL DECISION MAKING: Stable/uncomplicated  EVALUATION COMPLEXITY: Low   GOALS: Goals reviewed with patient? Yes  SHORT TERM GOALS: Target date: 08/22/2023  Pt will be independent with her initial HEP to decrease pain, improve strength, balance, and function.  Baseline: Pt has not yet started her initial HEP (08/06/2023)  Goal status: INITIAL    LONG TERM GOALS: Target date: 10/03/2023  Pt will have a decrease in low back pain to 3/10 or less  at worst to promote ability to perform standing tasks such as yard work more comfortably.  Baseline: 7/10 low back pain at worst for the past 3 months (08/06/2023) Goal status: INITIAL  2.  Pt will improve her Modified Oswestry Low Back Pain Disability Questionnaire to 10% or less as a demonstration of improved function.  Baseline: Modified Oswestry 9/50 (18%) (08/06/2023)  Goal status: INITIAL  3.  Pt will improve her B hip extension, ER, and abduction strength by at least 1/2 MMT grade to promote ability to perform closed chain tasks more comfortably for her back.  Baseline:  MMT Right eval Left eval  Hip extension (seated manually resisted) 3 3+  Hip  abduction (seated manually resisted) 4 4  Hip external rotation 4 4-   (08/06/2023)  Goal status: INITIAL  4.  Pt will improver her Timed Up and Go time to 12 seconds or less with her [facility] as a demonstration of improved functional mobility  Baseline: 17.8 seconds average with SPC on L side (08/06/2023) Goal status: INITIAL    PLAN:  PT FREQUENCY: 2x/week  PT DURATION: 8 weeks  PLANNED INTERVENTIONS: 97110-Therapeutic exercises, 97530- Therapeutic activity, 97112- Neuromuscular re-education, 97535- Self Care, [id number]- Manual therapy, [id number]- Gait training, [phone]- Canalith repositioning, [id number]- Electrical stimulation (unattended), [phone]- Ionotophoresis 4mg /ml Dexamethasone, Patient/Family education, Balance training, Stair training, Dry Needling, Joint mobilization, and Vestibular training.  PLAN FOR NEXT SESSION: Posture, trunk and hip strength, femoral control, functional strengthening, balance, manual techniques, modalities PRN   Davionne Mastrangelo, PT, DPT 08/06/2023, 12:30 PM

## 2023-08-08 ENCOUNTER — Ambulatory Visit: Payer: Medicare Other

## 2023-08-08 DIAGNOSIS — R262 Difficulty in walking, not elsewhere classified: Secondary | ICD-10-CM

## 2023-08-08 DIAGNOSIS — M5459 Other low back pain: Secondary | ICD-10-CM

## 2023-08-08 DIAGNOSIS — M6281 Muscle weakness (generalized): Secondary | ICD-10-CM | POA: Diagnosis not present

## 2023-08-08 DIAGNOSIS — R2681 Unsteadiness on feet: Secondary | ICD-10-CM | POA: Diagnosis not present

## 2023-08-08 DIAGNOSIS — M5441 Lumbago with sciatica, right side: Secondary | ICD-10-CM | POA: Diagnosis not present

## 2023-08-08 NOTE — Therapy (Signed)
 OUTPATIENT PHYSICAL THERAPY TREATMENT  Patient Name: Deborah Baxter MRN: [id number] DOB:[birth year], 89 y.o., female Today's Date: 08/08/2023  END OF SESSION:  PT End of Session - 08/08/23 0821     Visit Number 2    Number of Visits 17    Date for PT Re-Evaluation 10/03/23    PT Start Time 0821    PT Stop Time 0901    PT Time Calculation (min) 40 min    Activity Tolerance Patient tolerated treatment well    Behavior During Therapy [facility] for tasks assessed/performed              Past Medical History:  Diagnosis Date   (HFpEF) heart failure with preserved ejection fraction (HCC) 04/24/2021   Pulmonary raised concern - question of pulmonary hypertension secondary to DD.  Question of need for right heart cath   A-fib [facility]) 04/24/2021   Acute heart failure with preserved ejection fraction (HCC)    Ascending aorta dilatation (HCC) 08/01/2021   Atrial fibrillation with rapid ventricular response (HCC) 03/20/2021   Congestion of nasal sinus 10/13/2021   Cough 04/23/2021   Diverticulosis 09/05/2012   Colonoscopy 08/31/12 - redundant colon and diverticulosis in the sigmoid colon, descending colon, transverse colon and hepatic flexure.  Recommend f/u colonoscopy in 10 years.    Elevated bilirubin 09/13/2021   Elevated blood pressure reading 12/05/2020   Health care maintenance 07/13/2014   Physical 07/12/14.  Colonoscopy 08/31/12.  Normal bone density 04/06/10.  Mammogram 05/30/20- Birads I.     Hx of degenerative disc disease    Hypercholesterolemia    Hypertensive urgency 03/20/2021   Ingrown toenail 09/13/2021   Left hand pain 03/20/2021   Low back pain 01/29/2021   Lumbago 07/13/2014   Lumbar spinal stenosis 06/27/2012   Neuromuscular disorder (HCC)    pinched nerve   Numbness and tingling in left hand 08/21/2017   Osteoarthritis    Pleural effusion 09/13/2021   Pulmonary edema 03/20/2021   Seasonal allergies    Spinal stenosis    chronic back and leg pain   Stress 02/03/2021   Syncope  12/05/2020   Thrombocytopenia (HCC) 09/13/2021   Past Surgical History:  Procedure Laterality Date   APPENDECTOMY     bladder tack surgery  2000   BTL  1976   CARDIOVERSION N/A 05/11/2021   Procedure: CARDIOVERSION;  Surgeon: Antonieta Iba, MD;  Location: [facility] ORS;  Service: Cardiovascular;  Laterality: N/A;   CARPAL TUNNEL RELEASE     CATARACT EXTRACTION W/PHACO Left 12/07/2015   Procedure: CATARACT EXTRACTION PHACO AND INTRAOCULAR LENS PLACEMENT (IOC);  Surgeon: Nevada Crane, MD;  Location: [facility] ORS;  Service: Ophthalmology;  Laterality: Left;  [place] 1.23 AP% 23.2 CDE 19.28 Fluid Pack Lot # [id number]   EYE SURGERY  03/19/15   GANGLION CYST EXCISION     right hand   GAS/FLUID EXCHANGE Left 03/08/2015   Procedure: GAS/FLUID EXCHANGE;  Surgeon: Marcelene Butte, MD;  Location: [facility] ORS;  Service: Ophthalmology;  Laterality: Left;   RIGHT HEART CATH N/A 11/06/2021   Procedure: RIGHT HEART CATH;  Surgeon: Yvonne Kendall, MD;  Location: [facility] INVASIVE CV LAB;  Service: Cardiovascular;  Laterality: N/A;   TONSILLECTOMY     TUBAL LIGATION     VITRECTOMY 25 GAUGE WITH SCLERAL BUCKLE Left 03/08/2015   Procedure: VITRECTOMY 25 GAUGE, membrane peel, 26 %  SF6 gas exchange;  Surgeon: Marcelene Butte, MD;  Location: [facility] ORS;  Service: Ophthalmology;  Laterality: Left;  casette lot # [id number]  Patient Active Problem List   Diagnosis Date Noted   Unsteady gait 05/16/2023   Arm pain 08/18/2022   Hx of degenerative disc disease 10/23/2021   Osteoarthritis 10/23/2021   Seasonal allergies 10/23/2021   Spinal stenosis 10/23/2021   Elevated bilirubin 09/13/2021   Thrombocytopenia (HCC) 09/13/2021   Ingrown toenail 09/13/2021   Ascending aorta dilatation (HCC) 08/01/2021   A-fib (HCC) 04/24/2021   (HFpEF) heart failure with preserved ejection fraction (HCC) 04/24/2021   Acute heart failure with preserved ejection fraction (HCC)    Atrial fibrillation with rapid ventricular response (HCC)  03/20/2021   Left hand pain 03/20/2021   Stress 02/03/2021   Low back pain 01/29/2021   Syncope 12/05/2020   Elevated blood pressure reading 12/05/2020   Numbness and tingling in left hand 08/21/2017   Lumbago 07/13/2014   Health care maintenance 07/13/2014   Diverticulosis 09/05/2012   Hypercholesterolemia 06/27/2012   Lumbar spinal stenosis 06/27/2012    PCP: Dale [place], MD  REFERRING PROVIDER: Dale , MD  REFERRING DIAG: R26.81 (ICD-10-CM) - Unsteady [phone] (ICD-10-CM) - Right-sided low back pain with right-sided sciatica, unspecified chronicity  Rationale for Evaluation and Treatment: Rehabilitation  THERAPY DIAG:  Other low back pain  Difficulty in walking, not elsewhere classified  Unsteadiness on feet  ONSET DATE: 05/16/2023 (date PT referral was signed)  SUBJECTIVE:                                                                                                                                                                                           SUBJECTIVE STATEMENT: R low back/posterior hip is about the same. 2/10 currently, "I can bear it."      PERTINENT HISTORY:  R sided low back pain, and unsteady gait. Chronic, which progressively worsened over the years. Has gone to a chiropractor for a while which stopped helping. Has not had back surgery. No osteoporosis to her knowledge. Balance is also not as good, such as when standing. Larey Seat a long time ago 50 years ago which bothered her R low back. Back pain has remained the same since several years ago. Feels R LE radiating symptoms to her foot (L5 dermatome on R)   Blood pressure is controlled per pt.      PAIN:  Are you having pain? Yes: NPRS scale: 4/10 Pain location: R low back/posterior hip Pain description: catching; dull pain Aggravating factors: weather, picking up sticks in the yard (daughter states pt has a very high tolerance for pain) Relieving factors: ice pack, sitting  eases back pain, tylenol  PRECAUTIONS: Fall risk  RED FLAGS: Bowel or bladder incontinence: Yes: Bladder, doctor Lorin Picket  aware. and Cauda equina syndrome: No   WEIGHT BEARING RESTRICTIONS: No  FALLS:  Has patient fallen in last 6 months? No  LIVING ENVIRONMENT: Lives with: lives alone Lives in: House/apartment Stairs: Yes: Internal: about 12 to the basement to do laundry 2x/week steps; L rail going down to the basement and External: 2 steps; on right going up Has following equipment at home: Single point cane and Shower bench  OCCUPATION: retired  PLOF: Requires assistive device for independence  PATIENT GOALS: Be without the pain. Improve balance.   NEXT MD VISIT: Yes but pt does not know. Has a wellness visit.   OBJECTIVE:  Note: Objective measures were completed at Evaluation unless otherwise noted.  DIAGNOSTIC FINDINGS:  DG Lumbar Spine 2-3 Views 01/30/2024  Narrative & Impression  CLINICAL DATA:  Low back pain, radiating down right leg   EXAM: LUMBAR SPINE - 2-3 VIEW   COMPARISON:  2019   FINDINGS: Alignment is similar with levoscoliosis. Diffusely decreased osseous mineralization in combination with overlying bowel gas limits evaluation. Vertebral body heights appear similar. Multilevel disc space narrowing, endplate osteophytes, and facet hypertrophy.   IMPRESSION: Suboptimal evaluation due to demineralization. Multilevel spondylosis superimposed on scoliosis similar to prior study.     Electronically Signed   By: Guadlupe Spanish M.D.   On: 01/30/2021 09:09    PATIENT SURVEYS:  Modified Oswestry 9/50 (18%) (08/06/2023)   COGNITION: Overall cognitive status: Within functional limits for tasks assessed     SENSATION:   MUSCLE LENGTH:    POSTURE: forward neck B protracted shoulders, R shoulder lower, forward flexed, R iliac crest and greater trochanter higher, L lumbar convexity around L3, L knee in slight flexion, R knee genu valgus.    PALPATION: TTP to R posterior hip/piriformis (proximal attachment and muscle belly), TTP around R posterior hip around Sciatic nerve.  LUMBAR ROM:   AROM eval  Flexion WFL with R low back pain  Extension Limited with low back pain  Right lateral flexion WFL  Left lateral flexion WFL  Right rotation WFL with R low back pain  Left rotation WFL   (Blank rows = not tested)  LOWER EXTREMITY ROM:     Passive  Right eval Left eval  Hip flexion    Hip extension    Hip abduction    Hip adduction    Hip internal rotation    Hip external rotation    Knee flexion    Knee extension    Ankle dorsiflexion    Ankle plantarflexion    Ankle inversion    Ankle eversion     (Blank rows = not tested)  LOWER EXTREMITY MMT:    MMT Right eval Left eval  Hip flexion 4- 4-  Hip extension (seated manually resisted) 3 3+  Hip abduction (seated manually resisted) 4 4  Hip adduction    Hip internal rotation    Hip external rotation 4 4-  Knee flexion 4 4  Knee extension 4+ 5  Ankle dorsiflexion    Ankle plantarflexion    Ankle inversion    Ankle eversion     (Blank rows = not tested)  LUMBAR SPECIAL TESTS:    FUNCTIONAL TESTS:  5 times sit to stand: unable to test at this time Timed up and go (TUG): 18.24 seconds; 17.24 s, 17.88 seconds with SPC on L; (17.8 seconds average)  Dynamic Gait Index: unable to test at this time Decreased femoral control R with STS   GAIT: Distance walked: 30 ft  Assistive device utilized: Single point cane Level of assistance: Modified independence Comments: Antalic, decreased stance R LE, SPC on R, R knee genu valgus during stance phase of gait  TREATMENT DATE: 08/08/2023                                                                                                                               Therapeutic Exercies  Sit <> Stand 10x with emphasis on femoral contol   Sitting with upright posture and transversus abdominis contraction    Manually resisted R trunk side bend isometrics 10x5 seconds for 3 sets   Manually resisted L trunk side bend isometrics 10x5 seconds   Seated trunk flexion stretch 10x5 seconds for 2 sets  Seated transversus abdominis and glute max muscle activation 10x3 with 5 second holds  Seated B scapular retraction 10x3 with 5 second holds to promote thoracic extension and decrease extension stress to low back.   Reviewed HEP. Pt and daughter demonstrated and verbalized understanding.    Improved exercise technique, movement at target joints, use of target muscles after mod verbal, visual, tactile cues.       PATIENT EDUCATION:  Education details: there-ex, HEP Person educated: Patient, Child(ren), and Estate manager/land agent Education method: Explanation, Demonstration, Tactile cues, Verbal cues, and Handouts Education comprehension: verbalized understanding and returned demonstration  HOME EXERCISE PROGRAM: Access Code: [email or web address] URL: [email or web address] Date: 08/08/2023 Prepared by: Loralyn Freshwater  Exercises - Seated Flexion Stretch  - 3 x daily - 7 x weekly - 3 sets - 10 reps - 5 seconds hold - Seated Transversus Abdominis Bracing  - 3 x daily - 7 x weekly - 3 sets - 10 reps - 5 seconds hold - Seated Gluteal Sets  - 3 x daily - 7 x weekly - 3 sets - 10 reps - 5 seconds hold  ASSESSMENT:  CLINICAL IMPRESSION: Worked on improving trunk posture, trunk and glute max muscle activation and strength, as well as thoracic extension to decrease stress to her low back. Pt tolerated session well without aggravation of symptoms. Pt will benefit from continued skilled physical therapy services to decrease pain, improve strength, balance, and function.        OBJECTIVE IMPAIRMENTS: Abnormal gait, decreased balance, difficulty walking, decreased strength, improper body mechanics, postural dysfunction, and pain.   ACTIVITY LIMITATIONS: carrying, lifting, bending, standing,  squatting, stairs, transfers, and locomotion level  PARTICIPATION LIMITATIONS:   PERSONAL FACTORS: Age, Fitness, Time since onset of injury/illness/exacerbation, and 3+ comorbidities: heart failure, A-fib, ascending aorta dilation, osteoarthritis, syncope  are also ; affecting patient's functional outcome.   REHAB POTENTIAL: Fair    CLINICAL DECISION MAKING: Stable/uncomplicated  EVALUATION COMPLEXITY: Low   GOALS: Goals reviewed with patient? Yes  SHORT TERM GOALS: Target date: 08/22/2023  Pt will be independent with her initial HEP to decrease pain, improve strength, balance, and function.  Baseline: Pt has not yet started her initial HEP (08/06/2023)  Goal status: INITIAL  LONG TERM GOALS: Target date: 10/03/2023  Pt will have a decrease in low back pain to 3/10 or less at worst to promote ability to perform standing tasks such as yard work more comfortably.  Baseline: 7/10 low back pain at worst for the past 3 months (08/06/2023) Goal status: INITIAL  2.  Pt will improve her Modified Oswestry Low Back Pain Disability Questionnaire to 10% or less as a demonstration of improved function.  Baseline: Modified Oswestry 9/50 (18%) (08/06/2023)  Goal status: INITIAL  3.  Pt will improve her B hip extension, ER, and abduction strength by at least 1/2 MMT grade to promote ability to perform closed chain tasks more comfortably for her back.  Baseline:  MMT Right eval Left eval  Hip extension (seated manually resisted) 3 3+  Hip abduction (seated manually resisted) 4 4  Hip external rotation 4 4-   (08/06/2023)  Goal status: INITIAL  4.  Pt will improver her Timed Up and Go time to 12 seconds or less with her [facility] as a demonstration of improved functional mobility  Baseline: 17.8 seconds average with SPC on L side (08/06/2023) Goal status: INITIAL    PLAN:  PT FREQUENCY: 2x/week  PT DURATION: 8 weeks  PLANNED INTERVENTIONS: 97110-Therapeutic exercises, 97530- Therapeutic  activity, 97112- Neuromuscular re-education, 97535- Self Care, [id number]- Manual therapy, [id number]- Gait training, [phone]- Canalith repositioning, [id number]- Electrical stimulation (unattended), [phone]- Ionotophoresis 4mg /ml Dexamethasone, Patient/Family education, Balance training, Stair training, Dry Needling, Joint mobilization, and Vestibular training.  PLAN FOR NEXT SESSION: Posture, trunk and hip strength, femoral control, functional strengthening, balance, manual techniques, modalities PRN   [place] Decoteau, PT, DPT 08/08/2023, 11:40 AM

## 2023-08-13 ENCOUNTER — Ambulatory Visit: Payer: Medicare Other

## 2023-08-13 DIAGNOSIS — M6281 Muscle weakness (generalized): Secondary | ICD-10-CM | POA: Diagnosis not present

## 2023-08-13 DIAGNOSIS — R262 Difficulty in walking, not elsewhere classified: Secondary | ICD-10-CM

## 2023-08-13 DIAGNOSIS — M5459 Other low back pain: Secondary | ICD-10-CM | POA: Diagnosis not present

## 2023-08-13 DIAGNOSIS — R2681 Unsteadiness on feet: Secondary | ICD-10-CM

## 2023-08-13 DIAGNOSIS — M5441 Lumbago with sciatica, right side: Secondary | ICD-10-CM | POA: Diagnosis not present

## 2023-08-13 NOTE — Therapy (Signed)
 OUTPATIENT PHYSICAL THERAPY TREATMENT  Patient Name: Deborah Baxter MRN: [id number] DOB:1934/01/12, 89 y.o., female Today's Date: 08/13/2023  END OF SESSION:  PT End of Session - 08/13/23 0904     Visit Number 3    Number of Visits 17    Date for PT Re-Evaluation 10/03/23    Authorization Type Medicare A & B    Progress Note Due on Visit 10    PT Start Time 0900    PT Stop Time 0940    PT Time Calculation (min) 40 min    Equipment Utilized During Treatment Gait belt    Activity Tolerance Patient tolerated treatment well    Behavior During Therapy WFL for tasks assessed/performed              Past Medical History:  Diagnosis Date   (HFpEF) heart failure with preserved ejection fraction (HCC) 04/24/2021   Pulmonary raised concern - question of pulmonary hypertension secondary to DD.  Question of need for right heart cath   A-fib [facility]) 04/24/2021   Acute heart failure with preserved ejection fraction (HCC)    Ascending aorta dilatation (HCC) 08/01/2021   Atrial fibrillation with rapid ventricular response (HCC) 03/20/2021   Congestion of nasal sinus 10/13/2021   Cough 04/23/2021   Diverticulosis 09/05/2012   Colonoscopy 08/31/12 - redundant colon and diverticulosis in the sigmoid colon, descending colon, transverse colon and hepatic flexure.  Recommend f/u colonoscopy in 10 years.    Elevated bilirubin 09/13/2021   Elevated blood pressure reading 12/05/2020   Health care maintenance 07/13/2014   Physical 07/12/14.  Colonoscopy 08/31/12.  Normal bone density 04/06/10.  Mammogram 05/30/20- Birads I.     Hx of degenerative disc disease    Hypercholesterolemia    Hypertensive urgency 03/20/2021   Ingrown toenail 09/13/2021   Left hand pain 03/20/2021   Low back pain 01/29/2021   Lumbago 07/13/2014   Lumbar spinal stenosis 06/27/2012   Neuromuscular disorder (HCC)    pinched nerve   Numbness and tingling in left hand 08/21/2017   Osteoarthritis    Pleural effusion 09/13/2021   Pulmonary  edema 03/20/2021   Seasonal allergies    Spinal stenosis    chronic back and leg pain   Stress 02/03/2021   Syncope 12/05/2020   Thrombocytopenia (HCC) 09/13/2021   Past Surgical History:  Procedure Laterality Date   APPENDECTOMY     bladder tack surgery  2000   BTL  1976   CARDIOVERSION N/A 05/11/2021   Procedure: CARDIOVERSION;  Surgeon: Antonieta Iba, MD;  Location: [facility] ORS;  Service: Cardiovascular;  Laterality: N/A;   CARPAL TUNNEL RELEASE     CATARACT EXTRACTION W/PHACO Left 12/07/2015   Procedure: CATARACT EXTRACTION PHACO AND INTRAOCULAR LENS PLACEMENT (IOC);  Surgeon: Nevada Crane, MD;  Location: [facility] ORS;  Service: Ophthalmology;  Laterality: Left;  [place] 1.23 AP% 23.2 CDE 19.28 Fluid Pack Lot # [id number]   EYE SURGERY  03/19/15   GANGLION CYST EXCISION     right hand   GAS/FLUID EXCHANGE Left 03/08/2015   Procedure: GAS/FLUID EXCHANGE;  Surgeon: Marcelene Butte, MD;  Location: [facility] ORS;  Service: Ophthalmology;  Laterality: Left;   RIGHT HEART CATH N/A 11/06/2021   Procedure: RIGHT HEART CATH;  Surgeon: Yvonne Kendall, MD;  Location: [facility] INVASIVE CV LAB;  Service: Cardiovascular;  Laterality: N/A;   TONSILLECTOMY     TUBAL LIGATION     VITRECTOMY 25 GAUGE WITH SCLERAL BUCKLE Left 03/08/2015   Procedure: VITRECTOMY 25 GAUGE, membrane peel,  26 %  SF6 gas exchange;  Surgeon: Marcelene Butte, MD;  Location: [facility] ORS;  Service: Ophthalmology;  Laterality: Left;  casette lot # [id number]   Patient Active Problem List   Diagnosis Date Noted   Unsteady gait 05/16/2023   Arm pain 08/18/2022   Hx of degenerative disc disease 10/23/2021   Osteoarthritis 10/23/2021   Seasonal allergies 10/23/2021   Spinal stenosis 10/23/2021   Elevated bilirubin 09/13/2021   Thrombocytopenia (HCC) 09/13/2021   Ingrown toenail 09/13/2021   Ascending aorta dilatation (HCC) 08/01/2021   A-fib (HCC) 04/24/2021   (HFpEF) heart failure with preserved ejection fraction (HCC) 04/24/2021   Acute  heart failure with preserved ejection fraction [facility])    Atrial fibrillation with rapid ventricular response (HCC) 03/20/2021   Left hand pain 03/20/2021   Stress 02/03/2021   Low back pain 01/29/2021   Syncope 12/05/2020   Elevated blood pressure reading 12/05/2020   Numbness and tingling in left hand 08/21/2017   Lumbago 07/13/2014   Health care maintenance 07/13/2014   Diverticulosis 09/05/2012   Hypercholesterolemia 06/27/2012   Lumbar spinal stenosis 06/27/2012    PCP: Dale Chumuckla, MD  REFERRING PROVIDER: Dale Byhalia, MD  REFERRING DIAG: R26.81 (ICD-10-CM) - Unsteady [phone] (ICD-10-CM) - Right-sided low back pain with right-sided sciatica, unspecified chronicity  Rationale for Evaluation and Treatment: Rehabilitation  THERAPY DIAG:  Other low back pain  Difficulty in walking, not elsewhere classified  Unsteadiness on feet  Muscle weakness (generalized)  ONSET DATE: 05/16/2023 (date PT referral was signed)  SUBJECTIVE:                                                                                                                                                                                           SUBJECTIVE STATEMENT: R low back/posterior hip is about the same. 2/10 currently, "I can bear it."      PERTINENT HISTORY:  R sided low back pain, and unsteady gait. Chronic, which progressively worsened over the years. Has gone to a chiropractor for a while which stopped helping. Has not had back surgery. No osteoporosis to her knowledge. Balance is also not as good, such as when standing. Larey Seat a long time ago 50 years ago which bothered her R low back. Back pain has remained the same since several years ago. Feels R LE radiating symptoms to her foot (L5 dermatome on R)   Blood pressure is controlled per pt.      PAIN:  Are you having pain? Yes: NPRS scale: 4/10 Pain location: R low back/posterior hip Pain description: catching; dull  pain Aggravating factors: weather, picking up sticks in the yard (daughter states  pt has a very high tolerance for pain) Relieving factors: ice pack, sitting eases back pain, tylenol  PRECAUTIONS: Fall risk  RED FLAGS: Bowel or bladder incontinence: Yes: Bladder, doctor Scott aware. and Cauda equina syndrome: No   WEIGHT BEARING RESTRICTIONS: No  FALLS:  Has patient fallen in last 6 months? No  LIVING ENVIRONMENT: Lives with: lives alone Lives in: House/apartment Stairs: Yes: Internal: about 12 to the basement to do laundry 2x/week steps; L rail going down to the basement and External: 2 steps; on right going up Has following equipment at home: Single point cane and Shower bench  OCCUPATION: retired  PLOF: Requires assistive device for independence  PATIENT GOALS: Be without the pain. Improve balance.   NEXT MD VISIT: Yes but pt does not know. Has a wellness visit.   OBJECTIVE:  Note: Objective measures were completed at Evaluation unless otherwise noted.  DIAGNOSTIC FINDINGS:  DG Lumbar Spine 2-3 Views 01/30/2024  Narrative & Impression  CLINICAL DATA:  Low back pain, radiating down right leg   EXAM: LUMBAR SPINE - 2-3 VIEW   COMPARISON:  2019   FINDINGS: Alignment is similar with levoscoliosis. Diffusely decreased osseous mineralization in combination with overlying bowel gas limits evaluation. Vertebral body heights appear similar. Multilevel disc space narrowing, endplate osteophytes, and facet hypertrophy.   IMPRESSION: Suboptimal evaluation due to demineralization. Multilevel spondylosis superimposed on scoliosis similar to prior study.     Electronically Signed   By: Guadlupe Spanish M.D.   On: 01/30/2021 09:09    PATIENT SURVEYS:  Modified Oswestry 9/50 (18%) (08/06/2023)   COGNITION: Overall cognitive status: Within functional limits for tasks assessed     POSTURE: forward neck B protracted shoulders, R shoulder lower, forward flexed, R iliac crest  and greater trochanter higher, L lumbar convexity around L3, L knee in slight flexion, R knee genu valgus.   PALPATION: TTP to R posterior hip/piriformis (proximal attachment and muscle belly), TTP around R posterior hip around Sciatic nerve.  LUMBAR ROM:   AROM eval  Flexion WFL with R low back pain  Extension Limited with low back pain  Right lateral flexion WFL  Left lateral flexion WFL  Right rotation [facility] with R low back pain  Left rotation WFL   (Blank rows = not tested)  LOWER EXTREMITY ROM:     Passive  Right eval Left eval  Hip flexion    Hip extension    Hip abduction    Hip adduction    Hip internal rotation    Hip external rotation    Knee flexion    Knee extension    Ankle dorsiflexion    Ankle plantarflexion    Ankle inversion    Ankle eversion     (Blank rows = not tested)  LOWER EXTREMITY MMT:    MMT Right eval Left eval  Hip flexion 4- 4-  Hip extension (seated manually resisted) 3 3+  Hip abduction (seated manually resisted) 4 4  Hip adduction    Hip internal rotation    Hip external rotation 4 4-  Knee flexion 4 4  Knee extension 4+ 5  Ankle dorsiflexion    Ankle plantarflexion    Ankle inversion    Ankle eversion     (Blank rows = not tested)  LUMBAR SPECIAL TESTS:    FUNCTIONAL TESTS:  5 times sit to stand: unable to test at this time Timed up and go (TUG): 18.24 seconds; 17.24 s, 17.88 seconds with SPC on L; (17.8 seconds  average)  Dynamic Gait Index: unable to test at this time Decreased femoral control R with STS  GAIT: Distance walked: 30 ft Assistive device utilized: Single point cane Level of assistance: Modified independence Comments: Antalic, decreased stance R LE, SPC on R, R knee genu valgus during stance phase of gait  TREATMENT DATE: 08/13/23                                                                                                                             STS from chair, hands ad lib x10 Seated alternate  marching x20  Seated overhead lift/thoracic extension with silver ball x10   STS from chair, hands ad lib x10 Seated alternate marching x20  Seated overhead lift/thoracic extension with silver ball x10  Physioball (red) crunch hug (seated) 10x3secH (thoracolumbar flexors)  Cable row standing x15lb Seated trunk rotation, 2 hand grip on 7ft of blue TB 1x10 bilat   Physioball (red) crunch hug (seated) 10x3secH (thoracolumbar flexors)  Cable row standing x15lb Seated trunk rotation, 2 hand grip on 79ft of blue TB 1x10 bilat   Seated trunk flexion stretch 15x5 seconds  (blue physioball rollouts)  Standing heel raise and toe raise alternating, 2 hand support  Standing hip extension SLR c 2 hand support at bar 1x10 (can see Rt hip ext restriction, and pain at end range)   Balance training: med ball carry with SPC step over 180 degree turnaround and repeat   Improved exercise technique, movement at target joints, use of target muscles after mod verbal, visual, tactile cues.    PATIENT EDUCATION:  Education details: there-ex, HEP Person educated: Patient, Child(ren), and Estate manager/land agent Education method: Explanation, Demonstration, Tactile cues, Verbal cues, and Handouts Education comprehension: verbalized understanding and returned demonstration  HOME EXERCISE PROGRAM: Access Code: [email or web address] URL: [email or web address] Date: 08/08/2023 Prepared by: Loralyn Freshwater  Exercises - Seated Flexion Stretch  - 3 x daily - 7 x weekly - 3 sets - 10 reps - 5 seconds hold - Seated Transversus Abdominis Bracing  - 3 x daily - 7 x weekly - 3 sets - 10 reps - 5 seconds hold - Seated Gluteal Sets  - 3 x daily - 7 x weekly - 3 sets - 10 reps - 5 seconds hold  ASSESSMENT:  CLINICAL IMPRESSION: Continued interventions aimed at trunk posture, trunk and glute max muscle activation and strength, as well as thoracic extension to decrease load demands to low back. Pt remains engaged  and motivated. Pt tolerated session well without aggravation of symptoms. Pt will benefit from continued skilled physical therapy services to decrease pain, improve strength, balance, and function.   OBJECTIVE IMPAIRMENTS: Abnormal gait, decreased balance, difficulty walking, decreased strength, improper body mechanics, postural dysfunction, and pain.   ACTIVITY LIMITATIONS: carrying, lifting, bending, standing, squatting, stairs, transfers, and locomotion level  PARTICIPATION LIMITATIONS:   PERSONAL FACTORS: Age, Fitness, Time since onset of injury/illness/exacerbation, and 3+ comorbidities: heart failure, A-fib, ascending aorta dilation, osteoarthritis, syncope  are also ; affecting patient's functional outcome.   REHAB POTENTIAL: Fair    CLINICAL DECISION MAKING: Stable/uncomplicated  EVALUATION COMPLEXITY: Low  GOALS: Goals reviewed with patient? Yes  SHORT TERM GOALS: Target date: 08/22/2023  Pt will be independent with her initial HEP to decrease pain, improve strength, balance, and function.  Baseline: Pt has not yet started her initial HEP (08/06/2023)  Goal status: INITIAL  LONG TERM GOALS: Target date: 10/03/2023  Pt will have a decrease in low back pain to 3/10 or less at worst to promote ability to perform standing tasks such as yard work more comfortably.  Baseline: 7/10 low back pain at worst for the past 3 months (08/06/2023) Goal status: INITIAL  2.  Pt will improve her Modified Oswestry Low Back Pain Disability Questionnaire to 10% or less as a demonstration of improved function.  Baseline: Modified Oswestry 9/50 (18%) (08/06/2023)  Goal status: INITIAL  3.  Pt will improve her B hip extension, ER, and abduction strength by at least 1/2 MMT grade to promote ability to perform closed chain tasks more comfortably for her back.  Baseline:  MMT Right eval Left eval  Hip extension (seated manually resisted) 3 3+  Hip abduction (seated manually resisted) 4 4  Hip external  rotation 4 4-   (08/06/2023) Goal status: INITIAL  4.  Pt will improver her Timed Up and Go time to 12 seconds or less with her [facility] as a demonstration of improved functional mobility  Baseline: 17.8 seconds average with SPC on L side (08/06/2023) Goal status: INITIAL  PLAN:  PT FREQUENCY: 2x/week  PT DURATION: 8 weeks  PLANNED INTERVENTIONS: 97110-Therapeutic exercises, 97530- Therapeutic activity, 97112- Neuromuscular re-education, 97535- Self Care, [id number]- Manual therapy, [id number]- Gait training, [phone]- Canalith repositioning, [id number]- Electrical stimulation (unattended), [phone]- Ionotophoresis 4mg /ml Dexamethasone, Patient/Family education, Balance training, Stair training, Dry Needling, Joint mobilization, and Vestibular training.  PLAN FOR NEXT SESSION: Posture, trunk and hip strength, femoral control, functional strengthening, balance, manual techniques, modalities PRN   9:06 AM, 08/13/23 Rosamaria Lints, PT, DPT Physical Therapist - [place] [phone] (Office)   Rosamaria Lints, PT, DPT 08/13/2023, 9:06 AM

## 2023-08-19 ENCOUNTER — Ambulatory Visit: Payer: Medicare Other

## 2023-08-19 DIAGNOSIS — M5441 Lumbago with sciatica, right side: Secondary | ICD-10-CM | POA: Diagnosis not present

## 2023-08-19 DIAGNOSIS — M6281 Muscle weakness (generalized): Secondary | ICD-10-CM

## 2023-08-19 DIAGNOSIS — R262 Difficulty in walking, not elsewhere classified: Secondary | ICD-10-CM | POA: Diagnosis not present

## 2023-08-19 DIAGNOSIS — M5459 Other low back pain: Secondary | ICD-10-CM | POA: Diagnosis not present

## 2023-08-19 DIAGNOSIS — R2681 Unsteadiness on feet: Secondary | ICD-10-CM | POA: Diagnosis not present

## 2023-08-19 NOTE — Therapy (Signed)
 OUTPATIENT PHYSICAL THERAPY TREATMENT  Patient Name: Deborah Baxter MRN: [id number] DOB:12-02-1933, 89 y.o., female Today's Date: 08/19/2023  END OF SESSION:  PT End of Session - 08/19/23 1517     Visit Number 4    Number of Visits 17    Date for PT Re-Evaluation 10/03/23    Authorization Type Medicare A & B    Progress Note Due on Visit 10    PT Start Time 1518    PT Stop Time 1603    PT Time Calculation (min) 45 min    Equipment Utilized During Treatment Gait belt    Activity Tolerance Patient tolerated treatment well    Behavior During Therapy WFL for tasks assessed/performed               Past Medical History:  Diagnosis Date   (HFpEF) heart failure with preserved ejection fraction (HCC) 04/24/2021   Pulmonary raised concern - question of pulmonary hypertension secondary to DD.  Question of need for right heart cath   A-fib [facility]) 04/24/2021   Acute heart failure with preserved ejection fraction (HCC)    Ascending aorta dilatation (HCC) 08/01/2021   Atrial fibrillation with rapid ventricular response (HCC) 03/20/2021   Congestion of nasal sinus 10/13/2021   Cough 04/23/2021   Diverticulosis 09/05/2012   Colonoscopy 08/31/12 - redundant colon and diverticulosis in the sigmoid colon, descending colon, transverse colon and hepatic flexure.  Recommend f/u colonoscopy in 10 years.    Elevated bilirubin 09/13/2021   Elevated blood pressure reading 12/05/2020   Health care maintenance 07/13/2014   Physical 07/12/14.  Colonoscopy 08/31/12.  Normal bone density 04/06/10.  Mammogram 05/30/20- Birads I.     Hx of degenerative disc disease    Hypercholesterolemia    Hypertensive urgency 03/20/2021   Ingrown toenail 09/13/2021   Left hand pain 03/20/2021   Low back pain 01/29/2021   Lumbago 07/13/2014   Lumbar spinal stenosis 06/27/2012   Neuromuscular disorder (HCC)    pinched nerve   Numbness and tingling in left hand 08/21/2017   Osteoarthritis    Pleural effusion 09/13/2021   Pulmonary  edema 03/20/2021   Seasonal allergies    Spinal stenosis    chronic back and leg pain   Stress 02/03/2021   Syncope 12/05/2020   Thrombocytopenia (HCC) 09/13/2021   Past Surgical History:  Procedure Laterality Date   APPENDECTOMY     bladder tack surgery  2000   BTL  1976   CARDIOVERSION N/A 05/11/2021   Procedure: CARDIOVERSION;  Surgeon: Antonieta Iba, MD;  Location: [facility] ORS;  Service: Cardiovascular;  Laterality: N/A;   CARPAL TUNNEL RELEASE     CATARACT EXTRACTION W/PHACO Left 12/07/2015   Procedure: CATARACT EXTRACTION PHACO AND INTRAOCULAR LENS PLACEMENT (IOC);  Surgeon: Nevada Crane, MD;  Location: [facility] ORS;  Service: Ophthalmology;  Laterality: Left;  [place] 1.23 AP% 23.2 CDE 19.28 Fluid Pack Lot # [id number]   EYE SURGERY  03/19/15   GANGLION CYST EXCISION     right hand   GAS/FLUID EXCHANGE Left 03/08/2015   Procedure: GAS/FLUID EXCHANGE;  Surgeon: Marcelene Butte, MD;  Location: [facility] ORS;  Service: Ophthalmology;  Laterality: Left;   RIGHT HEART CATH N/A 11/06/2021   Procedure: RIGHT HEART CATH;  Surgeon: Yvonne Kendall, MD;  Location: [facility] INVASIVE CV LAB;  Service: Cardiovascular;  Laterality: N/A;   TONSILLECTOMY     TUBAL LIGATION     VITRECTOMY 25 GAUGE WITH SCLERAL BUCKLE Left 03/08/2015   Procedure: VITRECTOMY 25 GAUGE, membrane  peel, 26 %  SF6 gas exchange;  Surgeon: Marcelene Butte, MD;  Location: [facility] ORS;  Service: Ophthalmology;  Laterality: Left;  casette lot # [id number]   Patient Active Problem List   Diagnosis Date Noted   Unsteady gait 05/16/2023   Arm pain 08/18/2022   Hx of degenerative disc disease 10/23/2021   Osteoarthritis 10/23/2021   Seasonal allergies 10/23/2021   Spinal stenosis 10/23/2021   Elevated bilirubin 09/13/2021   Thrombocytopenia (HCC) 09/13/2021   Ingrown toenail 09/13/2021   Ascending aorta dilatation (HCC) 08/01/2021   A-fib (HCC) 04/24/2021   (HFpEF) heart failure with preserved ejection fraction (HCC) 04/24/2021   Acute  heart failure with preserved ejection fraction [facility])    Atrial fibrillation with rapid ventricular response (HCC) 03/20/2021   Left hand pain 03/20/2021   Stress 02/03/2021   Low back pain 01/29/2021   Syncope 12/05/2020   Elevated blood pressure reading 12/05/2020   Numbness and tingling in left hand 08/21/2017   Lumbago 07/13/2014   Health care maintenance 07/13/2014   Diverticulosis 09/05/2012   Hypercholesterolemia 06/27/2012   Lumbar spinal stenosis 06/27/2012    PCP: Dale Arroyo, MD  REFERRING PROVIDER: Dale Banner, MD  REFERRING DIAG: R26.81 (ICD-10-CM) - Unsteady [phone] (ICD-10-CM) - Right-sided low back pain with right-sided sciatica, unspecified chronicity  Rationale for Evaluation and Treatment: Rehabilitation  THERAPY DIAG:  Other low back pain  Difficulty in walking, not elsewhere classified  Unsteadiness on feet  Muscle weakness (generalized)  ONSET DATE: 05/16/2023 (date PT referral was signed)  SUBJECTIVE:                                                                                                                                                                                           SUBJECTIVE STATEMENT: R low back is not a whole lot difference. Feels more steady on her feet. 3/10 R low back pain currently.       PERTINENT HISTORY:  R sided low back pain, and unsteady gait. Chronic, which progressively worsened over the years. Has gone to a chiropractor for a while which stopped helping. Has not had back surgery. No osteoporosis to her knowledge. Balance is also not as good, such as when standing. Larey Seat a long time ago 50 years ago which bothered her R low back. Back pain has remained the same since several years ago. Feels R LE radiating symptoms to her foot (L5 dermatome on R)   Blood pressure is controlled per pt.   No latex allergies     PAIN:  Are you having pain? Yes: NPRS scale: 4/10 Pain location: R low back/posterior  hip Pain description: catching;  dull pain Aggravating factors: weather, picking up sticks in the yard (daughter states pt has a very high tolerance for pain) Relieving factors: ice pack, sitting eases back pain, tylenol  PRECAUTIONS: Fall risk  RED FLAGS: Bowel or bladder incontinence: Yes: Bladder, doctor Scott aware. and Cauda equina syndrome: No   WEIGHT BEARING RESTRICTIONS: No  FALLS:  Has patient fallen in last 6 months? No  LIVING ENVIRONMENT: Lives with: lives alone Lives in: House/apartment Stairs: Yes: Internal: about 12 to the basement to do laundry 2x/week steps; L rail going down to the basement and External: 2 steps; on right going up Has following equipment at home: Single point cane and Shower bench  OCCUPATION: retired  PLOF: Requires assistive device for independence  PATIENT GOALS: Be without the pain. Improve balance.   NEXT MD VISIT: Yes but pt does not know. Has a wellness visit.   OBJECTIVE:  Note: Objective measures were completed at Evaluation unless otherwise noted.  DIAGNOSTIC FINDINGS:  DG Lumbar Spine 2-3 Views 01/30/2024  Narrative & Impression  CLINICAL DATA:  Low back pain, radiating down right leg   EXAM: LUMBAR SPINE - 2-3 VIEW   COMPARISON:  2019   FINDINGS: Alignment is similar with levoscoliosis. Diffusely decreased osseous mineralization in combination with overlying bowel gas limits evaluation. Vertebral body heights appear similar. Multilevel disc space narrowing, endplate osteophytes, and facet hypertrophy.   IMPRESSION: Suboptimal evaluation due to demineralization. Multilevel spondylosis superimposed on scoliosis similar to prior study.     Electronically Signed   By: Guadlupe Spanish M.D.   On: 01/30/2021 09:09    PATIENT SURVEYS:  Modified Oswestry 9/50 (18%) (08/06/2023)   COGNITION: Overall cognitive status: Within functional limits for tasks assessed     SENSATION:   MUSCLE LENGTH:    POSTURE: forward  neck B protracted shoulders, R shoulder lower, forward flexed, R iliac crest and greater trochanter higher, L lumbar convexity around L3, L knee in slight flexion, R knee genu valgus.   PALPATION: TTP to R posterior hip/piriformis (proximal attachment and muscle belly), TTP around R posterior hip around Sciatic nerve.  LUMBAR ROM:   AROM eval  Flexion WFL with R low back pain  Extension Limited with low back pain  Right lateral flexion WFL  Left lateral flexion WFL  Right rotation WFL with R low back pain  Left rotation WFL   (Blank rows = not tested)  LOWER EXTREMITY ROM:     Passive  Right eval Left eval  Hip flexion    Hip extension    Hip abduction    Hip adduction    Hip internal rotation    Hip external rotation    Knee flexion    Knee extension    Ankle dorsiflexion    Ankle plantarflexion    Ankle inversion    Ankle eversion     (Blank rows = not tested)  LOWER EXTREMITY MMT:    MMT Right eval Left eval  Hip flexion 4- 4-  Hip extension (seated manually resisted) 3 3+  Hip abduction (seated manually resisted) 4 4  Hip adduction    Hip internal rotation    Hip external rotation 4 4-  Knee flexion 4 4  Knee extension 4+ 5  Ankle dorsiflexion    Ankle plantarflexion    Ankle inversion    Ankle eversion     (Blank rows = not tested)  LUMBAR SPECIAL TESTS:    FUNCTIONAL TESTS:  5 times sit to stand: unable to  test at this time Timed up and go (TUG): 18.24 seconds; 17.24 s, 17.88 seconds with SPC on L; (17.8 seconds average)  Dynamic Gait Index: unable to test at this time Decreased femoral control R with STS   GAIT: Distance walked: 30 ft Assistive device utilized: Single point cane Level of assistance: Modified independence Comments: Antalic, decreased stance R LE, SPC on R, R knee genu valgus during stance phase of gait  TREATMENT DATE: 08/19/2023                                                                                                                                Therapeutic Exercies  Reclined  hooklying    posterior pelvic tilt 10x with 5 second holds, then 6x5 second holds    B sciatic symptoms to legs, eases with rest.     Hip extension isometrics, leg straight    R 10x5 seconds for 3 sets    L 10x5 seconds for 3 sets    Clamshell yellow band 5 seconds. Reproduced symptoms    Then 5x6 comfortable range, no holds. No increase in symptoms    Improved exercise technique, movement at target joints, use of target muscles after mod verbal, visual, tactile cues.    Neuromuscular re-education  Seated B scapular retraction 10x5 seconds to improve posture  Static standing 30 seconds with emphasis on placing and maintaining her center of gravity over her base of support eyes open  Then eyes closed 8x5 seconds, B UE light touch assist PRN   More difficulty with eyes closed.   Improved exercise technique, movement at target joints, use of target muscles after mod verbal, visual, tactile cues.       PATIENT EDUCATION:  Education details: there-ex, HEP Person educated: Patient, Child(ren), and Estate manager/land agent Education method: Explanation, Demonstration, Tactile cues, Verbal cues, and Handouts Education comprehension: verbalized understanding and returned demonstration  HOME EXERCISE PROGRAM: Access Code: [email or web address] URL: [email or web address] Date: 08/08/2023 Prepared by: Loralyn Freshwater  Exercises - Seated Flexion Stretch  - 3 x daily - 7 x weekly - 3 sets - 10 reps - 5 seconds hold - Seated Transversus Abdominis Bracing  - 3 x daily - 7 x weekly - 3 sets - 10 reps - 5 seconds hold - Seated Gluteal Sets  - 3 x daily - 7 x weekly - 3 sets - 10 reps - 5 seconds hold  - Hooklying Clamshell with Resistance  - 1 x daily - 7 x weekly - 6 sets - 5 reps  Yellow band  Hooklying Hip extension isometrics, leg straight    R 10x5 seconds for 3 sets    L 10x5 seconds for 3  sets     ASSESSMENT:  CLINICAL IMPRESSION:  Worked on improving trunk and glute med/max strengthening to help decrease stress to low back during standing tasks. Also worked on improving pt's ability to place and maintain her center of gravity over her base  of support both with eyes open and eyes closed (more challenging) to improve balance and decrease fall risk at home. Pt tolerated session well without aggravation of symptoms. Pt will benefit from continued skilled physical therapy services to decrease pain, improve strength, balance, and function.        OBJECTIVE IMPAIRMENTS: Abnormal gait, decreased balance, difficulty walking, decreased strength, improper body mechanics, postural dysfunction, and pain.   ACTIVITY LIMITATIONS: carrying, lifting, bending, standing, squatting, stairs, transfers, and locomotion level  PARTICIPATION LIMITATIONS:   PERSONAL FACTORS: Age, Fitness, Time since onset of injury/illness/exacerbation, and 3+ comorbidities: heart failure, A-fib, ascending aorta dilation, osteoarthritis, syncope  are also ; affecting patient's functional outcome.   REHAB POTENTIAL: Fair    CLINICAL DECISION MAKING: Stable/uncomplicated  EVALUATION COMPLEXITY: Low   GOALS: Goals reviewed with patient? Yes  SHORT TERM GOALS: Target date: 08/22/2023  Pt will be independent with her initial HEP to decrease pain, improve strength, balance, and function.  Baseline: Pt has not yet started her initial HEP (08/06/2023)  Goal status: INITIAL    LONG TERM GOALS: Target date: 10/03/2023  Pt will have a decrease in low back pain to 3/10 or less at worst to promote ability to perform standing tasks such as yard work more comfortably.  Baseline: 7/10 low back pain at worst for the past 3 months (08/06/2023) Goal status: INITIAL  2.  Pt will improve her Modified Oswestry Low Back Pain Disability Questionnaire to 10% or less as a demonstration of improved function.  Baseline: Modified  Oswestry 9/50 (18%) (08/06/2023)  Goal status: INITIAL  3.  Pt will improve her B hip extension, ER, and abduction strength by at least 1/2 MMT grade to promote ability to perform closed chain tasks more comfortably for her back.  Baseline:  MMT Right eval Left eval  Hip extension (seated manually resisted) 3 3+  Hip abduction (seated manually resisted) 4 4  Hip external rotation 4 4-   (08/06/2023)  Goal status: INITIAL  4.  Pt will improver her Timed Up and Go time to 12 seconds or less with her [facility] as a demonstration of improved functional mobility  Baseline: 17.8 seconds average with SPC on L side (08/06/2023) Goal status: INITIAL    PLAN:  PT FREQUENCY: 2x/week  PT DURATION: 8 weeks  PLANNED INTERVENTIONS: 97110-Therapeutic exercises, 97530- Therapeutic activity, 97112- Neuromuscular re-education, 97535- Self Care, 86578- Manual therapy, [id number]- Gait training, [phone]- Canalith repositioning, [id number]- Electrical stimulation (unattended), [phone]- Ionotophoresis 4mg /ml Dexamethasone, Patient/Family education, Balance training, Stair training, Dry Needling, Joint mobilization, and Vestibular training.  PLAN FOR NEXT SESSION: Posture, trunk and hip strength, femoral control, functional strengthening, balance, manual techniques, modalities PRN   Leonora Gores, PT, DPT 08/19/2023, 4:13 PM

## 2023-08-22 ENCOUNTER — Ambulatory Visit: Payer: Medicare Other

## 2023-08-22 DIAGNOSIS — M5441 Lumbago with sciatica, right side: Secondary | ICD-10-CM | POA: Diagnosis not present

## 2023-08-22 DIAGNOSIS — M6281 Muscle weakness (generalized): Secondary | ICD-10-CM

## 2023-08-22 DIAGNOSIS — R2681 Unsteadiness on feet: Secondary | ICD-10-CM | POA: Diagnosis not present

## 2023-08-22 DIAGNOSIS — R262 Difficulty in walking, not elsewhere classified: Secondary | ICD-10-CM

## 2023-08-22 DIAGNOSIS — M5459 Other low back pain: Secondary | ICD-10-CM | POA: Diagnosis not present

## 2023-08-22 NOTE — Therapy (Signed)
 OUTPATIENT PHYSICAL THERAPY TREATMENT  Patient Name: Deborah Baxter MRN: [id number] DOB:[birth year], 89 y.o., female Today's Date: 08/22/2023  END OF SESSION:  PT End of Session - 08/22/23 0902     Visit Number 5    Number of Visits 17    Date for PT Re-Evaluation 10/03/23    Authorization Type Medicare A & B    Progress Note Due on Visit 10    PT Start Time 0902    PT Stop Time 0944    PT Time Calculation (min) 42 min    Equipment Utilized During Treatment Gait belt    Activity Tolerance Patient tolerated treatment well    Behavior During Therapy WFL for tasks assessed/performed                Past Medical History:  Diagnosis Date   (HFpEF) heart failure with preserved ejection fraction (HCC) 04/24/2021   Pulmonary raised concern - question of pulmonary hypertension secondary to DD.  Question of need for right heart cath   A-fib [facility]) 04/24/2021   Acute heart failure with preserved ejection fraction (HCC)    Ascending aorta dilatation (HCC) 08/01/2021   Atrial fibrillation with rapid ventricular response (HCC) 03/20/2021   Congestion of nasal sinus 10/13/2021   Cough 04/23/2021   Diverticulosis 09/05/2012   Colonoscopy 08/31/12 - redundant colon and diverticulosis in the sigmoid colon, descending colon, transverse colon and hepatic flexure.  Recommend f/u colonoscopy in 10 years.    Elevated bilirubin 09/13/2021   Elevated blood pressure reading 12/05/2020   Health care maintenance 07/13/2014   Physical 07/12/14.  Colonoscopy 08/31/12.  Normal bone density 04/06/10.  Mammogram 05/30/20- Birads I.     Hx of degenerative disc disease    Hypercholesterolemia    Hypertensive urgency 03/20/2021   Ingrown toenail 09/13/2021   Left hand pain 03/20/2021   Low back pain 01/29/2021   Lumbago 07/13/2014   Lumbar spinal stenosis 06/27/2012   Neuromuscular disorder (HCC)    pinched nerve   Numbness and tingling in left hand 08/21/2017   Osteoarthritis    Pleural effusion 09/13/2021    Pulmonary edema 03/20/2021   Seasonal allergies    Spinal stenosis    chronic back and leg pain   Stress 02/03/2021   Syncope 12/05/2020   Thrombocytopenia (HCC) 09/13/2021   Past Surgical History:  Procedure Laterality Date   APPENDECTOMY     bladder tack surgery  2000   BTL  1976   CARDIOVERSION N/A 05/11/2021   Procedure: CARDIOVERSION;  Surgeon: Antonieta Iba, MD;  Location: [facility] ORS;  Service: Cardiovascular;  Laterality: N/A;   CARPAL TUNNEL RELEASE     CATARACT EXTRACTION W/PHACO Left 12/07/2015   Procedure: CATARACT EXTRACTION PHACO AND INTRAOCULAR LENS PLACEMENT (IOC);  Surgeon: Nevada Crane, MD;  Location: [facility] ORS;  Service: Ophthalmology;  Laterality: Left;  [place] 1.23 AP% 23.2 CDE 19.28 Fluid Pack Lot # [id number]   EYE SURGERY  03/19/15   GANGLION CYST EXCISION     right hand   GAS/FLUID EXCHANGE Left 03/08/2015   Procedure: GAS/FLUID EXCHANGE;  Surgeon: Marcelene Butte, MD;  Location: [facility] ORS;  Service: Ophthalmology;  Laterality: Left;   RIGHT HEART CATH N/A 11/06/2021   Procedure: RIGHT HEART CATH;  Surgeon: Yvonne Kendall, MD;  Location: [facility] INVASIVE CV LAB;  Service: Cardiovascular;  Laterality: N/A;   TONSILLECTOMY     TUBAL LIGATION     VITRECTOMY 25 GAUGE WITH SCLERAL BUCKLE Left 03/08/2015   Procedure: VITRECTOMY 25 GAUGE,  membrane peel, 26 %  SF6 gas exchange;  Surgeon: Marcelene Butte, MD;  Location: [facility] ORS;  Service: Ophthalmology;  Laterality: Left;  casette lot # [id number]   Patient Active Problem List   Diagnosis Date Noted   Unsteady gait 05/16/2023   Arm pain 08/18/2022   Hx of degenerative disc disease 10/23/2021   Osteoarthritis 10/23/2021   Seasonal allergies 10/23/2021   Spinal stenosis 10/23/2021   Elevated bilirubin 09/13/2021   Thrombocytopenia (HCC) 09/13/2021   Ingrown toenail 09/13/2021   Ascending aorta dilatation (HCC) 08/01/2021   A-fib (HCC) 04/24/2021   (HFpEF) heart failure with preserved ejection fraction (HCC) 04/24/2021    Acute heart failure with preserved ejection fraction [facility])    Atrial fibrillation with rapid ventricular response (HCC) 03/20/2021   Left hand pain 03/20/2021   Stress 02/03/2021   Low back pain 01/29/2021   Syncope 12/05/2020   Elevated blood pressure reading 12/05/2020   Numbness and tingling in left hand 08/21/2017   Lumbago 07/13/2014   Health care maintenance 07/13/2014   Diverticulosis 09/05/2012   Hypercholesterolemia 06/27/2012   Lumbar spinal stenosis 06/27/2012    PCP: Dale Southworth, MD  REFERRING PROVIDER: Dale Belen, MD  REFERRING DIAG: R26.81 (ICD-10-CM) - Unsteady [id number] (ICD-10-CM) - Right-sided low back pain with right-sided sciatica, unspecified chronicity  Rationale for Evaluation and Treatment: Rehabilitation  THERAPY DIAG:  Other low back pain  Difficulty in walking, not elsewhere classified  Unsteadiness on feet  Muscle weakness (generalized)  ONSET DATE: 05/16/2023 (date PT referral was signed)  SUBJECTIVE:                                                                                                                                                                                           SUBJECTIVE STATEMENT: R low back/hip is not a whole lot difference. 3/10 R low back/hip pain currently.       PERTINENT HISTORY:  R sided low back pain, and unsteady gait. Chronic, which progressively worsened over the years. Has gone to a chiropractor for a while which stopped helping. Has not had back surgery. No osteoporosis to her knowledge. Balance is also not as good, such as when standing. Larey Seat a long time ago 50 years ago which bothered her R low back. Back pain has remained the same since several years ago. Feels R LE radiating symptoms to her foot (L5 dermatome on R)   Blood pressure is controlled per pt.   No latex allergies     PAIN:  Are you having pain? Yes: NPRS scale: 4/10 Pain location: R low back/posterior hip Pain  description: catching; dull pain Aggravating factors: weather,  picking up sticks in the yard (daughter states pt has a very high tolerance for pain) Relieving factors: ice pack, sitting eases back pain, tylenol  PRECAUTIONS: Fall risk  RED FLAGS: Bowel or bladder incontinence: Yes: Bladder, doctor Scott aware. and Cauda equina syndrome: No   WEIGHT BEARING RESTRICTIONS: No  FALLS:  Has patient fallen in last 6 months? No  LIVING ENVIRONMENT: Lives with: lives alone Lives in: House/apartment Stairs: Yes: Internal: about 12 to the basement to do laundry 2x/week steps; L rail going down to the basement and External: 2 steps; on right going up Has following equipment at home: Single point cane and Shower bench  OCCUPATION: retired  PLOF: Requires assistive device for independence  PATIENT GOALS: Be without the pain. Improve balance.   NEXT MD VISIT: Yes but pt does not know. Has a wellness visit.   OBJECTIVE:  Note: Objective measures were completed at Evaluation unless otherwise noted.  DIAGNOSTIC FINDINGS:  DG Lumbar Spine 2-3 Views 01/30/2024  Narrative & Impression  CLINICAL DATA:  Low back pain, radiating down right leg   EXAM: LUMBAR SPINE - 2-3 VIEW   COMPARISON:  2019   FINDINGS: Alignment is similar with levoscoliosis. Diffusely decreased osseous mineralization in combination with overlying bowel gas limits evaluation. Vertebral body heights appear similar. Multilevel disc space narrowing, endplate osteophytes, and facet hypertrophy.   IMPRESSION: Suboptimal evaluation due to demineralization. Multilevel spondylosis superimposed on scoliosis similar to prior study.     Electronically Signed   By: Guadlupe Spanish M.D.   On: 01/30/2021 09:09    PATIENT SURVEYS:  Modified Oswestry 9/50 (18%) (08/06/2023)   COGNITION: Overall cognitive status: Within functional limits for tasks assessed     SENSATION:   MUSCLE LENGTH:    POSTURE: forward neck B  protracted shoulders, R shoulder lower, forward flexed, R iliac crest and greater trochanter higher, L lumbar convexity around L3, L knee in slight flexion, R knee genu valgus.   PALPATION: TTP to R posterior hip/piriformis (proximal attachment and muscle belly), TTP around R posterior hip around Sciatic nerve.  LUMBAR ROM:   AROM eval  Flexion WFL with R low back pain  Extension Limited with low back pain  Right lateral flexion WFL  Left lateral flexion WFL  Right rotation WFL with R low back pain  Left rotation WFL   (Blank rows = not tested)  LOWER EXTREMITY ROM:     Passive  Right eval Left eval  Hip flexion    Hip extension    Hip abduction    Hip adduction    Hip internal rotation    Hip external rotation    Knee flexion    Knee extension    Ankle dorsiflexion    Ankle plantarflexion    Ankle inversion    Ankle eversion     (Blank rows = not tested)  LOWER EXTREMITY MMT:    MMT Right eval Left eval  Hip flexion 4- 4-  Hip extension (seated manually resisted) 3 3+  Hip abduction (seated manually resisted) 4 4  Hip adduction    Hip internal rotation    Hip external rotation 4 4-  Knee flexion 4 4  Knee extension 4+ 5  Ankle dorsiflexion    Ankle plantarflexion    Ankle inversion    Ankle eversion     (Blank rows = not tested)  LUMBAR SPECIAL TESTS:    FUNCTIONAL TESTS:  5 times sit to stand: unable to test at this time Timed  up and go (TUG): 18.24 seconds; 17.24 s, 17.88 seconds with SPC on L; (17.8 seconds average)  Dynamic Gait Index: unable to test at this time Decreased femoral control R with STS   GAIT: Distance walked: 30 ft Assistive device utilized: Single point cane Level of assistance: Modified independence Comments: Antalic, decreased stance R LE, SPC on R, R knee genu valgus during stance phase of gait  TREATMENT DATE: 08/22/2023                                                                                                                                Therapeutic Exercies  Reclined  hooklying   Hip extension isometrics, leg straight    R 10x5 seconds for 3 sets    L 10x5 seconds for 3 sets  Sitting with upright posture  Gentle manually resisted L trunk side bend isometrics in neutral to decrease R trunk side bend posture 10x5 seconds for 3 sets  Gentle manually resisted R upper trunk rotation isometrics in neutral to promote L lumbar rotation posture (more neutral position) 10x3 with 5 second holds   B scapular retraction to promote thoracic extension 10x5 seconds for 2 sets   Standing with B UE assist   Hip abduction    R 10x3   L 10x3  Improved exercise technique, movement at target joints, use of target muscles after mod verbal, visual, tactile cues.    Neuromuscular re-education   Static standing 30 seconds with emphasis on placing and maintaining her center of gravity over her base of support eyes open  Then eyes closed 10x10 seconds, without UE assist    Seated B scapular retraction to promote thoracic extension 10x5 seconds for 2 sets    Standing with B UE assist   Glute set 10x5 seconds for 2 sets  Improved exercise technique, movement at target joints, use of target muscles after mod verbal, visual, tactile cues.       PATIENT EDUCATION:  Education details: there-ex, HEP Person educated: Patient, Child(ren), and Estate manager/land agent Education method: Explanation, Demonstration, Tactile cues, Verbal cues, and Handouts Education comprehension: verbalized understanding and returned demonstration  HOME EXERCISE PROGRAM: Access Code: [email or web address] URL: [email or web address] Date: 08/08/2023 Prepared by: Loralyn Freshwater  Exercises - Seated Flexion Stretch  - 3 x daily - 7 x weekly - 3 sets - 10 reps - 5 seconds hold - Seated Transversus Abdominis Bracing  - 3 x daily - 7 x weekly - 3 sets - 10 reps - 5 seconds hold - Seated Gluteal Sets  - 3 x daily - 7 x weekly - 3  sets - 10 reps - 5 seconds hold  - Hooklying Clamshell with Resistance  - 1 x daily - 7 x weekly - 6 sets - 5 reps  Yellow band  Hooklying Hip extension isometrics, leg straight    R 10x5 seconds for 3 sets    L 10x5 seconds for 3 sets  ASSESSMENT:  CLINICAL IMPRESSION:  Improved ability to [facility] her center of gravity over her base of support with eyes closed today compared to previous session. Continued working on improving trunk and glute med/max strengthening to help decrease stress to low back during standing tasks. Pt tolerated session well without aggravation of symptoms. Pt will benefit from continued skilled physical therapy services to decrease pain, improve strength, balance, and function.        OBJECTIVE IMPAIRMENTS: Abnormal gait, decreased balance, difficulty walking, decreased strength, improper body mechanics, postural dysfunction, and pain.   ACTIVITY LIMITATIONS: carrying, lifting, bending, standing, squatting, stairs, transfers, and locomotion level  PARTICIPATION LIMITATIONS:   PERSONAL FACTORS: Age, Fitness, Time since onset of injury/illness/exacerbation, and 3+ comorbidities: heart failure, A-fib, ascending aorta dilation, osteoarthritis, syncope  are also ; affecting patient's functional outcome.   REHAB POTENTIAL: Fair    CLINICAL DECISION MAKING: Stable/uncomplicated  EVALUATION COMPLEXITY: Low   GOALS: Goals reviewed with patient? Yes  SHORT TERM GOALS: Target date: 08/22/2023  Pt will be independent with her initial HEP to decrease pain, improve strength, balance, and function.  Baseline: Pt has not yet started her initial HEP (08/06/2023)  Goal status: INITIAL    LONG TERM GOALS: Target date: 10/03/2023  Pt will have a decrease in low back pain to 3/10 or less at worst to promote ability to perform standing tasks such as yard work more comfortably.  Baseline: 7/10 low back pain at worst for the past 3 months (08/06/2023) Goal status:  INITIAL  2.  Pt will improve her Modified Oswestry Low Back Pain Disability Questionnaire to 10% or less as a demonstration of improved function.  Baseline: Modified Oswestry 9/50 (18%) (08/06/2023)  Goal status: INITIAL  3.  Pt will improve her B hip extension, ER, and abduction strength by at least 1/2 MMT grade to promote ability to perform closed chain tasks more comfortably for her back.  Baseline:  MMT Right eval Left eval  Hip extension (seated manually resisted) 3 3+  Hip abduction (seated manually resisted) 4 4  Hip external rotation 4 4-   (08/06/2023)  Goal status: INITIAL  4.  Pt will improver her Timed Up and Go time to 12 seconds or less with her [facility] as a demonstration of improved functional mobility  Baseline: 17.8 seconds average with SPC on L side (08/06/2023) Goal status: INITIAL    PLAN:  PT FREQUENCY: 2x/week  PT DURATION: 8 weeks  PLANNED INTERVENTIONS: 97110-Therapeutic exercises, 97530- Therapeutic activity, 97112- Neuromuscular re-education, 97535- Self Care, 95621- Manual therapy, [id number]- Gait training, [phone]- Canalith repositioning, [id number]- Electrical stimulation (unattended), [phone]- Ionotophoresis 4mg /ml Dexamethasone, Patient/Family education, Balance training, Stair training, Dry Needling, Joint mobilization, and Vestibular training.  PLAN FOR NEXT SESSION: Posture, trunk and hip strength, femoral control, functional strengthening, balance, manual techniques, modalities PRN   Sheyenne Konz, PT, DPT 08/22/2023, 11:38 AM

## 2023-08-29 ENCOUNTER — Ambulatory Visit: Payer: Medicare Other

## 2023-08-29 DIAGNOSIS — M6281 Muscle weakness (generalized): Secondary | ICD-10-CM

## 2023-08-29 DIAGNOSIS — R2681 Unsteadiness on feet: Secondary | ICD-10-CM | POA: Diagnosis not present

## 2023-08-29 DIAGNOSIS — R262 Difficulty in walking, not elsewhere classified: Secondary | ICD-10-CM | POA: Diagnosis not present

## 2023-08-29 DIAGNOSIS — M5459 Other low back pain: Secondary | ICD-10-CM

## 2023-08-29 DIAGNOSIS — M5441 Lumbago with sciatica, right side: Secondary | ICD-10-CM | POA: Diagnosis not present

## 2023-08-29 NOTE — Therapy (Signed)
 OUTPATIENT PHYSICAL THERAPY TREATMENT  Patient Name: Deborah Baxter MRN: [id number] DOB:16-Dec-1933, 89 y.o., female Today's Date: 08/29/2023  END OF SESSION:  PT End of Session - 08/29/23 0820     Visit Number 6    Number of Visits 17    Date for PT Re-Evaluation 10/03/23    Authorization Type Medicare A & B    Progress Note Due on Visit 10    PT Start Time 0820    PT Stop Time 0858    PT Time Calculation (min) 38 min    Equipment Utilized During Treatment Gait belt    Activity Tolerance Patient tolerated treatment well    Behavior During Therapy WFL for tasks assessed/performed                 Past Medical History:  Diagnosis Date   (HFpEF) heart failure with preserved ejection fraction (HCC) 04/24/2021   Pulmonary raised concern - question of pulmonary hypertension secondary to DD.  Question of need for right heart cath   A-fib [facility]) 04/24/2021   Acute heart failure with preserved ejection fraction (HCC)    Ascending aorta dilatation (HCC) 08/01/2021   Atrial fibrillation with rapid ventricular response (HCC) 03/20/2021   Congestion of nasal sinus 10/13/2021   Cough 04/23/2021   Diverticulosis 09/05/2012   Colonoscopy 08/31/12 - redundant colon and diverticulosis in the sigmoid colon, descending colon, transverse colon and hepatic flexure.  Recommend f/u colonoscopy in 10 years.    Elevated bilirubin 09/13/2021   Elevated blood pressure reading 12/05/2020   Health care maintenance 07/13/2014   Physical 07/12/14.  Colonoscopy 08/31/12.  Normal bone density 04/06/10.  Mammogram 05/30/20- Birads I.     Hx of degenerative disc disease    Hypercholesterolemia    Hypertensive urgency 03/20/2021   Ingrown toenail 09/13/2021   Left hand pain 03/20/2021   Low back pain 01/29/2021   Lumbago 07/13/2014   Lumbar spinal stenosis 06/27/2012   Neuromuscular disorder (HCC)    pinched nerve   Numbness and tingling in left hand 08/21/2017   Osteoarthritis    Pleural effusion 09/13/2021    Pulmonary edema 03/20/2021   Seasonal allergies    Spinal stenosis    chronic back and leg pain   Stress 02/03/2021   Syncope 12/05/2020   Thrombocytopenia (HCC) 09/13/2021   Past Surgical History:  Procedure Laterality Date   APPENDECTOMY     bladder tack surgery  2000   BTL  1976   CARDIOVERSION N/A 05/11/2021   Procedure: CARDIOVERSION;  Surgeon: Antonieta Iba, MD;  Location: [facility] ORS;  Service: Cardiovascular;  Laterality: N/A;   CARPAL TUNNEL RELEASE     CATARACT EXTRACTION W/PHACO Left 12/07/2015   Procedure: CATARACT EXTRACTION PHACO AND INTRAOCULAR LENS PLACEMENT (IOC);  Surgeon: Nevada Crane, MD;  Location: [facility] ORS;  Service: Ophthalmology;  Laterality: Left;  [place] 1.23 AP% 23.2 CDE 19.28 Fluid Pack Lot # [id number]   EYE SURGERY  03/19/15   GANGLION CYST EXCISION     right hand   GAS/FLUID EXCHANGE Left 03/08/2015   Procedure: GAS/FLUID EXCHANGE;  Surgeon: Marcelene Butte, MD;  Location: [facility] ORS;  Service: Ophthalmology;  Laterality: Left;   RIGHT HEART CATH N/A 11/06/2021   Procedure: RIGHT HEART CATH;  Surgeon: Yvonne Kendall, MD;  Location: [facility];  Service: Cardiovascular;  Laterality: N/A;   TONSILLECTOMY     TUBAL LIGATION     VITRECTOMY 25 GAUGE WITH SCLERAL BUCKLE Left 03/08/2015   Procedure: VITRECTOMY 25  GAUGE, membrane peel, 26 %  SF6 gas exchange;  Surgeon: Marcelene Butte, MD;  Location: [facility] ORS;  Service: Ophthalmology;  Laterality: Left;  casette lot # [id number]   Patient Active Problem List   Diagnosis Date Noted   Unsteady gait 05/16/2023   Arm pain 08/18/2022   Hx of degenerative disc disease 10/23/2021   Osteoarthritis 10/23/2021   Seasonal allergies 10/23/2021   Spinal stenosis 10/23/2021   Elevated bilirubin 09/13/2021   Thrombocytopenia (HCC) 09/13/2021   Ingrown toenail 09/13/2021   Ascending aorta dilatation (HCC) 08/01/2021   A-fib (HCC) 04/24/2021   (HFpEF) heart failure with preserved ejection fraction (HCC) 04/24/2021    Acute heart failure with preserved ejection fraction [facility])    Atrial fibrillation with rapid ventricular response (HCC) 03/20/2021   Left hand pain 03/20/2021   Stress 02/03/2021   Low back pain 01/29/2021   Syncope 12/05/2020   Elevated blood pressure reading 12/05/2020   Numbness and tingling in left hand 08/21/2017   Lumbago 07/13/2014   Health care maintenance 07/13/2014   Diverticulosis 09/05/2012   Hypercholesterolemia 06/27/2012   Lumbar spinal stenosis 06/27/2012    PCP: Dale Pacolet, MD  REFERRING PROVIDER: Dale Annawan, MD  REFERRING DIAG: R26.81 (ICD-10-CM) - Unsteady [phone] (ICD-10-CM) - Right-sided low back pain with right-sided sciatica, unspecified chronicity  Rationale for Evaluation and Treatment: Rehabilitation  THERAPY DIAG:  Other low back pain  Difficulty in walking, not elsewhere classified  Unsteadiness on feet  Muscle weakness (generalized)  ONSET DATE: 05/16/2023 (date PT referral was signed)  SUBJECTIVE:                                                                                                                                                                                           SUBJECTIVE STATEMENT: Feeling more stable on her feet. R low back/hip is about a 3/10 currently.     PERTINENT HISTORY:  R sided low back pain, and unsteady gait. Chronic, which progressively worsened over the years. Has gone to a chiropractor for a while which stopped helping. Has not had back surgery. No osteoporosis to her knowledge. Balance is also not as good, such as when standing. Larey Seat a long time ago 50 years ago which bothered her R low back. Back pain has remained the same since several years ago. Feels R LE radiating symptoms to her foot (L5 dermatome on R)   Blood pressure is controlled per pt.   No latex allergies     PAIN:  Are you having pain? Yes: NPRS scale: 4/10 Pain location: R low back/posterior hip Pain description:  catching; dull pain Aggravating factors: weather, picking up  sticks in the yard (daughter states pt has a very high tolerance for pain) Relieving factors: ice pack, sitting eases back pain, tylenol  PRECAUTIONS: Fall risk  RED FLAGS: Bowel or bladder incontinence: Yes: Bladder, doctor Scott aware. and Cauda equina syndrome: No   WEIGHT BEARING RESTRICTIONS: No  FALLS:  Has patient fallen in last 6 months? No  LIVING ENVIRONMENT: Lives with: lives alone Lives in: House/apartment Stairs: Yes: Internal: about 12 to the basement to do laundry 2x/week steps; L rail going down to the basement and External: 2 steps; on right going up Has following equipment at home: Single point cane and Shower bench  OCCUPATION: retired  PLOF: Requires assistive device for independence  PATIENT GOALS: Be without the pain. Improve balance.   NEXT MD VISIT: Yes but pt does not know. Has a wellness visit.   OBJECTIVE:  Note: Objective measures were completed at Evaluation unless otherwise noted.  DIAGNOSTIC FINDINGS:  DG Lumbar Spine 2-3 Views 01/30/2024  Narrative & Impression  CLINICAL DATA:  Low back pain, radiating down right leg   EXAM: LUMBAR SPINE - 2-3 VIEW   COMPARISON:  2019   FINDINGS: Alignment is similar with levoscoliosis. Diffusely decreased osseous mineralization in combination with overlying bowel gas limits evaluation. Vertebral body heights appear similar. Multilevel disc space narrowing, endplate osteophytes, and facet hypertrophy.   IMPRESSION: Suboptimal evaluation due to demineralization. Multilevel spondylosis superimposed on scoliosis similar to prior study.     Electronically Signed   By: Guadlupe Spanish M.D.   On: 01/30/2021 09:09    PATIENT SURVEYS:  Modified Oswestry 9/50 (18%) (08/06/2023)   COGNITION: Overall cognitive status: Within functional limits for tasks assessed     SENSATION:   MUSCLE LENGTH:    POSTURE: forward neck B protracted  shoulders, R shoulder lower, forward flexed, R iliac crest and greater trochanter higher, L lumbar convexity around L3, L knee in slight flexion, R knee genu valgus.   PALPATION: TTP to R posterior hip/piriformis (proximal attachment and muscle belly), TTP around R posterior hip around Sciatic nerve.  LUMBAR ROM:   AROM eval  Flexion WFL with R low back pain  Extension Limited with low back pain  Right lateral flexion WFL  Left lateral flexion WFL  Right rotation WFL with R low back pain  Left rotation WFL   (Blank rows = not tested)  LOWER EXTREMITY ROM:     Passive  Right eval Left eval  Hip flexion    Hip extension    Hip abduction    Hip adduction    Hip internal rotation    Hip external rotation    Knee flexion    Knee extension    Ankle dorsiflexion    Ankle plantarflexion    Ankle inversion    Ankle eversion     (Blank rows = not tested)  LOWER EXTREMITY MMT:    MMT Right eval Left eval  Hip flexion 4- 4-  Hip extension (seated manually resisted) 3 3+  Hip abduction (seated manually resisted) 4 4  Hip adduction    Hip internal rotation    Hip external rotation 4 4-  Knee flexion 4 4  Knee extension 4+ 5  Ankle dorsiflexion    Ankle plantarflexion    Ankle inversion    Ankle eversion     (Blank rows = not tested)  LUMBAR SPECIAL TESTS:    FUNCTIONAL TESTS:  5 times sit to stand: unable to test at this time Timed up and  go (TUG): 18.24 seconds; 17.24 s, 17.88 seconds with SPC on L; (17.8 seconds average)  Dynamic Gait Index: unable to test at this time Decreased femoral control R with STS   GAIT: Distance walked: 30 ft Assistive device utilized: Single point cane Level of assistance: Modified independence Comments: Antalic, decreased stance R LE, SPC on R, R knee genu valgus during stance phase of gait  TREATMENT DATE: 08/22/2023                                                                                                                                Therapeutic Exercies  Sit <> stand from regular chair with arms with B UE assist 5x  Then with yellow band around knees resisting hip abduction/ER 5x2  Standing with B UE assist, and yellow band around knees  Hip abduction    R 10x2   L 10x2  Standing B shoulder extension yellow band 10x2 with 5 second hold to promote trunk strength  Seated B scapular retraction to promote thoracic extension 10x5 seconds for 2 sets    Standign, bent over onto elevated mat table  Hip extension, knee bent to improve glute max strength   R 10x2   L 10x2   Improved exercise technique, movement at target joints, use of target muscles after mod verbal, visual, tactile cues.    Neuromuscular reeducation  SLS with B UE assist   R 10x5 seconds for 2 sets  L 10x5 seconds for 2 sets  Static standing 30 seconds with emphasis on placing and maintaining her center of gravity over her base of support eyes open  Then eyes closed 10x10 seconds, without UE assist      Improved technique, movement at target joints, use of target muscles after mod verbal, visual, tactile cues.        PATIENT EDUCATION:  Education details: there-ex, HEP Person educated: Patient, Child(ren), and Estate manager/land agent Education method: Explanation, Demonstration, Tactile cues, Verbal cues, and Handouts Education comprehension: verbalized understanding and returned demonstration  HOME EXERCISE PROGRAM: Access Code: [email or web address] URL: [email or web address] Date: 08/08/2023 Prepared by: Loralyn Freshwater  Exercises - Seated Flexion Stretch  - 3 x daily - 7 x weekly - 3 sets - 10 reps - 5 seconds hold - Seated Transversus Abdominis Bracing  - 3 x daily - 7 x weekly - 3 sets - 10 reps - 5 seconds hold - Seated Gluteal Sets  - 3 x daily - 7 x weekly - 3 sets - 10 reps - 5 seconds hold  - Hooklying Clamshell with Resistance  - 1 x daily - 7 x weekly - 6 sets - 5 reps  Yellow band  Hooklying Hip  extension isometrics, leg straight    R 10x5 seconds for 3 sets    L 10x5 seconds for 3 sets     ASSESSMENT:  CLINICAL IMPRESSION:  Improving steadiness on her feet based on subjective reports. Continued working on improving  ability to place and maintain her center of gravity over her base of support for balance. Continued working on improving trunk and glute med/max strength to help decrease stress to low back during standing tasks. Pt tolerated session well without aggravation of symptoms. Pt will benefit from continued skilled physical therapy services to decrease pain, improve strength, balance, and function.        OBJECTIVE IMPAIRMENTS: Abnormal gait, decreased balance, difficulty walking, decreased strength, improper body mechanics, postural dysfunction, and pain.   ACTIVITY LIMITATIONS: carrying, lifting, bending, standing, squatting, stairs, transfers, and locomotion level  PARTICIPATION LIMITATIONS:   PERSONAL FACTORS: Age, Fitness, Time since onset of injury/illness/exacerbation, and 3+ comorbidities: heart failure, A-fib, ascending aorta dilation, osteoarthritis, syncope  are also ; affecting patient's functional outcome.   REHAB POTENTIAL: Fair    CLINICAL DECISION MAKING: Stable/uncomplicated  EVALUATION COMPLEXITY: Low   GOALS: Goals reviewed with patient? Yes  SHORT TERM GOALS: Target date: 08/22/2023  Pt will be independent with her initial HEP to decrease pain, improve strength, balance, and function.  Baseline: Pt has not yet started her initial HEP (08/06/2023)  Goal status: INITIAL    LONG TERM GOALS: Target date: 10/03/2023  Pt will have a decrease in low back pain to 3/10 or less at worst to promote ability to perform standing tasks such as yard work more comfortably.  Baseline: 7/10 low back pain at worst for the past 3 months (08/06/2023) Goal status: INITIAL  2.  Pt will improve her Modified Oswestry Low Back Pain Disability Questionnaire to 10% or  less as a demonstration of improved function.  Baseline: Modified Oswestry 9/50 (18%) (08/06/2023)  Goal status: INITIAL  3.  Pt will improve her B hip extension, ER, and abduction strength by at least 1/2 MMT grade to promote ability to perform closed chain tasks more comfortably for her back.  Baseline:  MMT Right eval Left eval  Hip extension (seated manually resisted) 3 3+  Hip abduction (seated manually resisted) 4 4  Hip external rotation 4 4-   (08/06/2023)  Goal status: INITIAL  4.  Pt will improver her Timed Up and Go time to 12 seconds or less with her [facility] as a demonstration of improved functional mobility  Baseline: 17.8 seconds average with SPC on L side (08/06/2023) Goal status: INITIAL    PLAN:  PT FREQUENCY: 2x/week  PT DURATION: 8 weeks  PLANNED INTERVENTIONS: 97110-Therapeutic exercises, 97530- Therapeutic activity, 97112- Neuromuscular re-education, 97535- Self Care, [id number]- Manual therapy, [id number]- Gait training, [phone]- Canalith repositioning, [id number]- Electrical stimulation (unattended), [phone]- Ionotophoresis 4mg /ml Dexamethasone, Patient/Family education, Balance training, Stair training, Dry Needling, Joint mobilization, and Vestibular training.  PLAN FOR NEXT SESSION: Posture, trunk and hip strength, femoral control, functional strengthening, balance, manual techniques, modalities PRN   Robbin Escher, PT, DPT 08/29/2023, 12:04 PM

## 2023-09-02 ENCOUNTER — Ambulatory Visit: Payer: Medicare Other | Attending: Internal Medicine

## 2023-09-02 DIAGNOSIS — R2681 Unsteadiness on feet: Secondary | ICD-10-CM | POA: Diagnosis not present

## 2023-09-02 DIAGNOSIS — R262 Difficulty in walking, not elsewhere classified: Secondary | ICD-10-CM | POA: Diagnosis not present

## 2023-09-02 DIAGNOSIS — M5459 Other low back pain: Secondary | ICD-10-CM | POA: Insufficient documentation

## 2023-09-02 DIAGNOSIS — M6281 Muscle weakness (generalized): Secondary | ICD-10-CM | POA: Diagnosis not present

## 2023-09-02 NOTE — Therapy (Signed)
 OUTPATIENT PHYSICAL THERAPY TREATMENT  Patient Name: Deborah Baxter MRN: [id number] DOB:11-05-1933, 89 y.o., female Today's Date: 09/02/2023  END OF SESSION:  PT End of Session - 09/02/23 0907     Visit Number 7    Number of Visits 17    Date for PT Re-Evaluation 10/03/23    Authorization Type Medicare A & B    Progress Note Due on Visit 10    PT Start Time 0907   pt arrived late   PT Stop Time 0947    PT Time Calculation (min) 40 min    Equipment Utilized During Treatment Gait belt    Activity Tolerance Patient tolerated treatment well    Behavior During Therapy WFL for tasks assessed/performed                  Past Medical History:  Diagnosis Date   (HFpEF) heart failure with preserved ejection fraction (HCC) 04/24/2021   Pulmonary raised concern - question of pulmonary hypertension secondary to DD.  Question of need for right heart cath   A-fib [facility]) 04/24/2021   Acute heart failure with preserved ejection fraction (HCC)    Ascending aorta dilatation (HCC) 08/01/2021   Atrial fibrillation with rapid ventricular response (HCC) 03/20/2021   Congestion of nasal sinus 10/13/2021   Cough 04/23/2021   Diverticulosis 09/05/2012   Colonoscopy 08/31/12 - redundant colon and diverticulosis in the sigmoid colon, descending colon, transverse colon and hepatic flexure.  Recommend f/u colonoscopy in 10 years.    Elevated bilirubin 09/13/2021   Elevated blood pressure reading 12/05/2020   Health care maintenance 07/13/2014   Physical 07/12/14.  Colonoscopy 08/31/12.  Normal bone density 04/06/10.  Mammogram 05/30/20- Birads I.     Hx of degenerative disc disease    Hypercholesterolemia    Hypertensive urgency 03/20/2021   Ingrown toenail 09/13/2021   Left hand pain 03/20/2021   Low back pain 01/29/2021   Lumbago 07/13/2014   Lumbar spinal stenosis 06/27/2012   Neuromuscular disorder (HCC)    pinched nerve   Numbness and tingling in left hand 08/21/2017   Osteoarthritis    Pleural effusion  09/13/2021   Pulmonary edema 03/20/2021   Seasonal allergies    Spinal stenosis    chronic back and leg pain   Stress 02/03/2021   Syncope 12/05/2020   Thrombocytopenia (HCC) 09/13/2021   Past Surgical History:  Procedure Laterality Date   APPENDECTOMY     bladder tack surgery  2000   BTL  1976   CARDIOVERSION N/A 05/11/2021   Procedure: CARDIOVERSION;  Surgeon: Antonieta Iba, MD;  Location: [facility] ORS;  Service: Cardiovascular;  Laterality: N/A;   CARPAL TUNNEL RELEASE     CATARACT EXTRACTION W/PHACO Left 12/07/2015   Procedure: CATARACT EXTRACTION PHACO AND INTRAOCULAR LENS PLACEMENT (IOC);  Surgeon: Nevada Crane, MD;  Location: [facility] ORS;  Service: Ophthalmology;  Laterality: Left;  [place] 1.23 AP% 23.2 CDE 19.28 Fluid Pack Lot # [id number]   EYE SURGERY  03/19/15   GANGLION CYST EXCISION     right hand   GAS/FLUID EXCHANGE Left 03/08/2015   Procedure: GAS/FLUID EXCHANGE;  Surgeon: Marcelene Butte, MD;  Location: [facility] ORS;  Service: Ophthalmology;  Laterality: Left;   RIGHT HEART CATH N/A 11/06/2021   Procedure: RIGHT HEART CATH;  Surgeon: Yvonne Kendall, MD;  Location: [facility] INVASIVE [facility];  Service: Cardiovascular;  Laterality: N/A;   TONSILLECTOMY     TUBAL LIGATION     VITRECTOMY 25 GAUGE WITH SCLERAL BUCKLE Left 03/08/2015  Procedure: VITRECTOMY 25 GAUGE, membrane peel, 26 %  SF6 gas exchange;  Surgeon: Marcelene Butte, MD;  Location: [facility] ORS;  Service: Ophthalmology;  Laterality: Left;  casette lot # [id number]   Patient Active Problem List   Diagnosis Date Noted   Unsteady gait 05/16/2023   Arm pain 08/18/2022   Hx of degenerative disc disease 10/23/2021   Osteoarthritis 10/23/2021   Seasonal allergies 10/23/2021   Spinal stenosis 10/23/2021   Elevated bilirubin 09/13/2021   Thrombocytopenia (HCC) 09/13/2021   Ingrown toenail 09/13/2021   Ascending aorta dilatation (HCC) 08/01/2021   A-fib (HCC) 04/24/2021   (HFpEF) heart failure with preserved ejection fraction (HCC)  04/24/2021   Acute heart failure with preserved ejection fraction [facility])    Atrial fibrillation with rapid ventricular response (HCC) 03/20/2021   Left hand pain 03/20/2021   Stress 02/03/2021   Low back pain 01/29/2021   Syncope 12/05/2020   Elevated blood pressure reading 12/05/2020   Numbness and tingling in left hand 08/21/2017   Lumbago 07/13/2014   Health care maintenance 07/13/2014   Diverticulosis 09/05/2012   Hypercholesterolemia 06/27/2012   Lumbar spinal stenosis 06/27/2012    PCP: Dale [place], MD  REFERRING PROVIDER: Dale [place], MD  REFERRING DIAG: R26.81 (ICD-10-CM) - Unsteady [phone] (ICD-10-CM) - Right-sided low back pain with right-sided sciatica, unspecified chronicity  Rationale for Evaluation and Treatment: Rehabilitation  THERAPY DIAG:  Other low back pain  Difficulty in walking, not elsewhere classified  Unsteadiness on feet  Muscle weakness (generalized)  ONSET DATE: 05/16/2023 (date PT referral was signed)  SUBJECTIVE:                                                                                                                                                                                           SUBJECTIVE STATEMENT: Back is doing ok. Can tell a difference when the weather changes. It feels about the same. Balance is getting better. Feels like she has a pinched nerve in her back.       PERTINENT HISTORY:  R sided low back pain, and unsteady gait. Chronic, which progressively worsened over the years. Has gone to a chiropractor for a while which stopped helping. Has not had back surgery. No osteoporosis to her knowledge. Balance is also not as good, such as when standing. Larey Seat a long time ago 50 years ago which bothered her R low back. Back pain has remained the same since several years ago. Feels R LE radiating symptoms to her foot (L5 dermatome on R)   Blood pressure is controlled per pt.   No latex allergies     PAIN:   Are you  having pain? Yes: NPRS scale: 4/10 Pain location: R low back/posterior hip Pain description: catching; dull pain Aggravating factors: weather, picking up sticks in the yard (daughter states pt has a very high tolerance for pain) Relieving factors: ice pack, sitting eases back pain, tylenol  PRECAUTIONS: Fall risk  RED FLAGS: Bowel or bladder incontinence: Yes: Bladder, doctor Scott aware. and Cauda equina syndrome: No   WEIGHT BEARING RESTRICTIONS: No  FALLS:  Has patient fallen in last 6 months? No  LIVING ENVIRONMENT: Lives with: lives alone Lives in: House/apartment Stairs: Yes: Internal: about 12 to the basement to do laundry 2x/week steps; L rail going down to the basement and External: 2 steps; on right going up Has following equipment at home: Single point cane and Shower bench  OCCUPATION: retired  PLOF: Requires assistive device for independence  PATIENT GOALS: Be without the pain. Improve balance.   NEXT MD VISIT: Yes but pt does not know. Has a wellness visit.   OBJECTIVE:  Note: Objective measures were completed at Evaluation unless otherwise noted.  DIAGNOSTIC FINDINGS:  DG Lumbar Spine 2-3 Views 01/30/2024  Narrative & Impression  CLINICAL DATA:  Low back pain, radiating down right leg   EXAM: LUMBAR SPINE - 2-3 VIEW   COMPARISON:  2019   FINDINGS: Alignment is similar with levoscoliosis. Diffusely decreased osseous mineralization in combination with overlying bowel gas limits evaluation. Vertebral body heights appear similar. Multilevel disc space narrowing, endplate osteophytes, and facet hypertrophy.   IMPRESSION: Suboptimal evaluation due to demineralization. Multilevel spondylosis superimposed on scoliosis similar to prior study.     Electronically Signed   By: Guadlupe Spanish M.D.   On: 01/30/2021 09:09    PATIENT SURVEYS:  Modified Oswestry 9/50 (18%) (08/06/2023)   COGNITION: Overall cognitive status: Within functional limits  for tasks assessed     SENSATION:   MUSCLE LENGTH:    POSTURE: forward neck B protracted shoulders, R shoulder lower, forward flexed, R iliac crest and greater trochanter higher, L lumbar convexity around L3, L knee in slight flexion, R knee genu valgus.   PALPATION: TTP to R posterior hip/piriformis (proximal attachment and muscle belly), TTP around R posterior hip around Sciatic nerve.  LUMBAR ROM:   AROM eval  Flexion WFL with R low back pain  Extension Limited with low back pain  Right lateral flexion WFL  Left lateral flexion WFL  Right rotation [facility] with R low back pain  Left rotation WFL   (Blank rows = not tested)  LOWER EXTREMITY ROM:     Passive  Right eval Left eval  Hip flexion    Hip extension    Hip abduction    Hip adduction    Hip internal rotation    Hip external rotation    Knee flexion    Knee extension    Ankle dorsiflexion    Ankle plantarflexion    Ankle inversion    Ankle eversion     (Blank rows = not tested)  LOWER EXTREMITY MMT:    MMT Right eval Left eval  Hip flexion 4- 4-  Hip extension (seated manually resisted) 3 3+  Hip abduction (seated manually resisted) 4 4  Hip adduction    Hip internal rotation    Hip external rotation 4 4-  Knee flexion 4 4  Knee extension 4+ 5  Ankle dorsiflexion    Ankle plantarflexion    Ankle inversion    Ankle eversion     (Blank rows = not tested)  LUMBAR  SPECIAL TESTS:    FUNCTIONAL TESTS:  5 times sit to stand: unable to test at this time Timed up and go (TUG): 18.24 seconds; 17.24 s, 17.88 seconds with SPC on L; (17.8 seconds average)  Dynamic Gait Index: unable to test at this time Decreased femoral control R with STS   GAIT: Distance walked: 30 ft Assistive device utilized: Single point cane Level of assistance: Modified independence Comments: Antalic, decreased stance R LE, SPC on R, R knee genu valgus during stance phase of gait  TREATMENT DATE: 09/02/2023                                                                                                                                Therapeutic Exercies  Sit <> stand from regular chair with arms with B UE assist 5x  Then with yellow band around knees resisting hip abduction/ER 5x2  Standing with B UE assist, and yellow band around knees  Hip abduction    R 10x3   L 10x3   Static mini lunge with contralateral UE assist    R 10x3   L 10x3   Seated trunk flexion stretch 10x5 seconds for 2 sets   Seated hip ER   R 10x5 seconds for 3 sets   L 10x5 seconds for 3 sets   Improved exercise technique, movement at target joints, use of target muscles after mod verbal, visual, tactile cues.    Neuromuscular reeducation  SLS with B UE assist   R 8x10 seconds  L 8x10 seconds   Side stepping 5 ft to the R and 5 ft to the L without UE assist   5x   Improved technique, movement at target joints, use of target muscles after mod verbal, visual, tactile cues.        PATIENT EDUCATION:  Education details: there-ex, HEP Person educated: Patient, Child(ren), and Estate manager/land agent Education method: Explanation, Demonstration, Tactile cues, Verbal cues, and Handouts Education comprehension: verbalized understanding and returned demonstration  HOME EXERCISE PROGRAM: Access Code: [email or web address] URL: [email or web address] Date: 08/08/2023 Prepared by: Loralyn Freshwater  Exercises - Seated Flexion Stretch  - 3 x daily - 7 x weekly - 3 sets - 10 reps - 5 seconds hold - Seated Transversus Abdominis Bracing  - 3 x daily - 7 x weekly - 3 sets - 10 reps - 5 seconds hold - Seated Gluteal Sets  - 3 x daily - 7 x weekly - 3 sets - 10 reps - 5 seconds hold  - Hooklying Clamshell with Resistance  - 1 x daily - 7 x weekly - 6 sets - 5 reps  Yellow band  Hooklying Hip extension isometrics, leg straight    R 10x5 seconds for 3 sets    L 10x5 seconds for 3 sets     ASSESSMENT:  CLINICAL  IMPRESSION:  Improving balance based on subjective reports as well as clinical observation of increased steadiness with gait observed. Continued  working on improving ability to place and maintain her center of gravity over her base of support for balance. Continued working on glute strength to promote femoral control as well as decrease stress to low back during standing tasks. Weak closed chain R glute med and max strength observed.  Pt tolerated session well without aggravation of symptoms. Pt will benefit from continued skilled physical therapy services to decrease pain, improve strength, balance, and function.        OBJECTIVE IMPAIRMENTS: Abnormal gait, decreased balance, difficulty walking, decreased strength, improper body mechanics, postural dysfunction, and pain.   ACTIVITY LIMITATIONS: carrying, lifting, bending, standing, squatting, stairs, transfers, and locomotion level  PARTICIPATION LIMITATIONS:   PERSONAL FACTORS: Age, Fitness, Time since onset of injury/illness/exacerbation, and 3+ comorbidities: heart failure, A-fib, ascending aorta dilation, osteoarthritis, syncope  are also ; affecting patient's functional outcome.   REHAB POTENTIAL: Fair    CLINICAL DECISION MAKING: Stable/uncomplicated  EVALUATION COMPLEXITY: Low   GOALS: Goals reviewed with patient? Yes  SHORT TERM GOALS: Target date: 08/22/2023  Pt will be independent with her initial HEP to decrease pain, improve strength, balance, and function.  Baseline: Pt has not yet started her initial HEP (08/06/2023)  Goal status: INITIAL    LONG TERM GOALS: Target date: 10/03/2023  Pt will have a decrease in low back pain to 3/10 or less at worst to promote ability to perform standing tasks such as yard work more comfortably.  Baseline: 7/10 low back pain at worst for the past 3 months (08/06/2023) Goal status: INITIAL  2.  Pt will improve her Modified Oswestry Low Back Pain Disability Questionnaire to 10% or less as  a demonstration of improved function.  Baseline: Modified Oswestry 9/50 (18%) (08/06/2023)  Goal status: INITIAL  3.  Pt will improve her B hip extension, ER, and abduction strength by at least 1/2 MMT grade to promote ability to perform closed chain tasks more comfortably for her back.  Baseline:  MMT Right eval Left eval  Hip extension (seated manually resisted) 3 3+  Hip abduction (seated manually resisted) 4 4  Hip external rotation 4 4-   (08/06/2023)  Goal status: INITIAL  4.  Pt will improver her Timed Up and Go time to 12 seconds or less with her [facility] as a demonstration of improved functional mobility  Baseline: 17.8 seconds average with SPC on L side (08/06/2023) Goal status: INITIAL    PLAN:  PT FREQUENCY: 2x/week  PT DURATION: 8 weeks  PLANNED INTERVENTIONS: 97110-Therapeutic exercises, 97530- Therapeutic activity, 97112- Neuromuscular re-education, 97535- Self Care, [id number]- Manual therapy, [id number]- Gait training, [phone]- Canalith repositioning, [id number]- Electrical stimulation (unattended), [phone]- Ionotophoresis 4mg /ml Dexamethasone, Patient/Family education, Balance training, Stair training, Dry Needling, Joint mobilization, and Vestibular training.  PLAN FOR NEXT SESSION: Posture, trunk and hip strength, femoral control, functional strengthening, balance, manual techniques, modalities PRN   Kamaree Wheatley, PT, DPT 09/02/2023, 9:59 AM

## 2023-09-03 ENCOUNTER — Ambulatory Visit (INDEPENDENT_AMBULATORY_CARE_PROVIDER_SITE_OTHER): Payer: Medicare Other | Admitting: *Deleted

## 2023-09-03 VITALS — BP 110/81 | Ht 67.5 in | Wt 141.6 lb

## 2023-09-03 DIAGNOSIS — Z0000 Encounter for general adult medical examination without abnormal findings: Secondary | ICD-10-CM | POA: Diagnosis not present

## 2023-09-03 NOTE — Patient Instructions (Signed)
 Deborah Baxter , Thank you for taking time to come for your Medicare Wellness Visit. I appreciate your ongoing commitment to your health goals. Please review the following plan we discussed and let me know if I can assist you in the future.   Referrals/Orders/Follow-Ups/Clinician Recommendations: Unable to find documentation of a tetanus vaccine. You may to consider updating your tetanus.  This is a list of the screening recommended for you and due dates:  Health Maintenance  Topic Date Due   COVID-19 Vaccine (4 - 2024-25 season) 02/02/2023   Mammogram  12/27/2023   Flu Shot  01/02/2024   Medicare Annual Wellness Visit  09/02/2024   Pneumonia Vaccine  Completed   DEXA scan (bone density measurement)  Completed   Zoster (Shingles) Vaccine  Completed   HPV Vaccine  Aged Out   DTaP/Tdap/Td vaccine  Discontinued    Advanced directives: (Declined) Advance directive discussed with you today. Even though you declined this today, please call our office should you change your mind, and we can give you the proper paperwork for you to fill out. Patient will consider.  Next Medicare Annual Wellness Visit scheduled for next year: Yes 09/08/24 @ 10:10

## 2023-09-03 NOTE — Progress Notes (Signed)
 Subjective:   Deborah Baxter is a 89 y.o. who presents for a Medicare Wellness preventive visit.  Visit Complete: Virtual I connected with  Deborah Baxter on 09/03/23 by a audio enabled telemedicine application and verified that I am speaking with the correct person using two identifiers.  Patient Location: Home  Provider Location: Office/Clinic  I discussed the limitations of evaluation and management by telemedicine. The patient expressed understanding and agreed to proceed.  Vital Signs: Because this visit was a virtual/telehealth visit, some criteria may be missing or patient reported. Any vitals not documented were not able to be obtained and vitals that have been documented are patient reported.  VideoDeclined- This patient declined Librarian, academic. Therefore the visit was completed with audio only.  Persons Participating in Visit: Patient.  AWV Questionnaire: Yes: Patient Medicare AWV questionnaire was completed by the patient on 08/30/23; I have confirmed that all information answered by patient is correct and no changes since this date.  Cardiac Risk Factors include: advanced age (>70men, >82 women);dyslipidemia     Objective:    Today's Vitals   09/03/23 1055 09/03/23 1057  BP: 110/81   Weight: 141 lb 10 oz (64.2 kg)   Height: 5' 7.5" (1.715 m)   PainSc:  3    Body mass index is 21.85 kg/m.     09/03/2023   11:07 AM 08/06/2023    8:30 AM 09/02/2022   10:31 AM 08/20/2021   11:31 AM 03/20/2021   10:53 AM 08/17/2020    9:52 AM 08/17/2019    9:47 AM  Advanced Directives  Does Patient Have a Medical Advance Directive? No Yes No Yes No Yes Yes  Type of [employer] Power of [place];Living will Healthcare Power of [place];Living will  Does patient want to make changes to medical advance directive?  No - Patient declined  No - Patient declined  No - Patient declined No -  Patient declined  Copy of Healthcare Power of Attorney in Chart?    No - copy requested  Yes - validated most recent copy scanned in chart (See row information) Yes - validated most recent copy scanned in chart (See row information)  Would patient like information on creating a medical advance directive? No - Patient declined  No - Patient declined  No - Patient declined      Current Medications (verified) Outpatient Encounter Medications as of 09/03/2023  Medication Sig   acetaminophen (TYLENOL) 650 MG CR tablet Take 650 mg by mouth every 8 (eight) hours as needed for pain. Reported on 08/02/2015   metoprolol succinate (TOPROL XL) 25 MG 24 hr tablet Take 0.5 tablets (12.5 mg total) by mouth daily.   Multiple Vitamin (MULTIVITAMIN) tablet Take 1 tablet by mouth daily.   OVER THE COUNTER MEDICATION Place 1 drop into both eyes every 8 (eight) hours as needed (dry eyes.). Lubricant eye drops   Polyethylene Glycol 3350 (MIRALAX PO) Take 17 g by mouth every 3 (three) days. Every 3 days   rivaroxaban (XARELTO) 20 MG TABS tablet TAKE 1 TABLET BY MOUTH ONCE DAILY WITH SUPPER   Facility-Administered Encounter Medications as of 09/03/2023  Medication   sodium chloride flush (NS) 0.9 % injection 3 mL    Allergies (verified) Quinolones   History: Past Medical History:  Diagnosis Date   (HFpEF) heart failure with preserved ejection fraction (HCC) 04/24/2021   Pulmonary raised concern - question of  pulmonary hypertension secondary to DD.  Question of need for right heart cath   A-fib [facility]) 04/24/2021   Acute heart failure with preserved ejection fraction (HCC)    Ascending aorta dilatation (HCC) 08/01/2021   Atrial fibrillation with rapid ventricular response (HCC) 03/20/2021   Congestion of nasal sinus 10/13/2021   Cough 04/23/2021   Diverticulosis 09/05/2012   Colonoscopy 08/31/12 - redundant colon and diverticulosis in the sigmoid colon, descending colon, transverse colon and hepatic flexure.  Recommend  f/u colonoscopy in 10 years.    Elevated bilirubin 09/13/2021   Elevated blood pressure reading 12/05/2020   Health care maintenance 07/13/2014   Physical 07/12/14.  Colonoscopy 08/31/12.  Normal bone density 04/06/10.  Mammogram 05/30/20- Birads I.     Hx of degenerative disc disease    Hypercholesterolemia    Hypertensive urgency 03/20/2021   Ingrown toenail 09/13/2021   Left hand pain 03/20/2021   Low back pain 01/29/2021   Lumbago 07/13/2014   Lumbar spinal stenosis 06/27/2012   Neuromuscular disorder (HCC)    pinched nerve   Numbness and tingling in left hand 08/21/2017   Osteoarthritis    Pleural effusion 09/13/2021   Pulmonary edema 03/20/2021   Seasonal allergies    Spinal stenosis    chronic back and leg pain   Stress 02/03/2021   Syncope 12/05/2020   Thrombocytopenia (HCC) 09/13/2021   Past Surgical History:  Procedure Laterality Date   APPENDECTOMY     bladder tack surgery  2000   BTL  1976   CARDIOVERSION N/A 05/11/2021   Procedure: CARDIOVERSION;  Surgeon: Antonieta Iba, MD;  Location: [facility] ORS;  Service: Cardiovascular;  Laterality: N/A;   CARPAL TUNNEL RELEASE     CATARACT EXTRACTION W/PHACO Left 12/07/2015   Procedure: CATARACT EXTRACTION PHACO AND INTRAOCULAR LENS PLACEMENT (IOC);  Surgeon: Nevada Crane, MD;  Location: [facility] ORS;  Service: Ophthalmology;  Laterality: Left;  [place] 1.23 AP% 23.2 CDE 19.28 Fluid Pack Lot # [id number]   EYE SURGERY  03/19/15   GANGLION CYST EXCISION     right hand   GAS/FLUID EXCHANGE Left 03/08/2015   Procedure: GAS/FLUID EXCHANGE;  Surgeon: Marcelene Butte, MD;  Location: [facility] ORS;  Service: Ophthalmology;  Laterality: Left;   RIGHT HEART CATH N/A 11/06/2021   Procedure: RIGHT HEART CATH;  Surgeon: Yvonne Kendall, MD;  Location: [facility] INVASIVE CV LAB;  Service: Cardiovascular;  Laterality: N/A;   TONSILLECTOMY     TUBAL LIGATION     VITRECTOMY 25 GAUGE WITH SCLERAL BUCKLE Left 03/08/2015   Procedure: VITRECTOMY 25 GAUGE, membrane peel, 26 %   SF6 gas exchange;  Surgeon: Marcelene Butte, MD;  Location: [facility] ORS;  Service: Ophthalmology;  Laterality: Left;  casette lot # [id number]   Family History  Problem Relation Age of Onset   Diabetes Mother    Heart disease Father        Unknown age of onset. Unknown if had CHF or heart attack.   Leukemia Father    Breast cancer Sister        in her 31's   Cancer Sister        lung cancer   Thyroid cancer Sister    Breast cancer Maternal Aunt    Colon cancer Neg Hx    Social History   Socioeconomic History   Marital status: Widowed    Spouse name: Not on file   Number of children: 5   Years of education: Not on file   Highest education level: Some college,  no degree  Occupational History   Not on file  Tobacco Use   Smoking status: Former    Current packs/day: 0.00    Average packs/day: 0.3 packs/day for 10.0 years (2.5 ttl pk-yrs)    Types: Cigarettes    Start date: 24    Quit date: 62    Years since quitting: 52.2   Smokeless tobacco: Never  Vaping Use   Vaping status: Never Used  Substance and Sexual Activity   Alcohol use: Not Currently    Comment: occassional   Drug use: No   Sexual activity: Not on file  Other Topics Concern   Not on file  Social History Narrative   Not on file   Social Drivers of Health   Financial Resource Strain: Low Risk  (09/03/2023)   Overall Financial Resource Strain (CARDIA)    Difficulty of Paying Living Expenses: Not hard at all  Food Insecurity: No Food Insecurity (09/03/2023)   Hunger Vital Sign    Worried About Running Out of Food in the Last Year: Never true    Ran Out of Food in the Last Year: Never true  Transportation Needs: No Transportation Needs (09/03/2023)   PRAPARE - Administrator, Civil Service (Medical): No    Lack of Transportation (Non-Medical): No  Physical Activity: Insufficiently Active (09/03/2023)   Exercise Vital Sign    Days of Exercise per Week: 5 days    Minutes of Exercise per Session: 20  min  Stress: No Stress Concern Present (09/03/2023)   [employer] of Occupational Health - Occupational Stress Questionnaire    Feeling of Stress : Not at all  Social Connections: Moderately Isolated (09/03/2023)   Social Connection and Isolation Panel [NHANES]    Frequency of Communication with Friends and Family: More than three times a week    Frequency of Social Gatherings with Friends and Family: More than three times a week    Attends Religious Services: More than 4 times per year    Active Member of [employer] or Organizations: No    Attends Banker Meetings: Never    Marital Status: Widowed    Tobacco Counseling Counseling given: Not Answered    Clinical Intake:  Pre-visit preparation completed: Yes  Pain : 0-10 Pain Score: 3  Pain Type: Chronic pain Pain Location: Hip Pain Descriptors / Indicators: Dull Pain Onset: More than a month ago Pain Frequency: Intermittent     BMI - recorded: 21.85 Nutritional Status: BMI of 19-24  Normal Nutritional Risks: None Diabetes: No  No results found for: "HGBA1C"   How often do you need to have someone help you when you read instructions, pamphlets, or other written materials from your doctor or pharmacy?: 1 - Never  Interpreter Needed?: No  Information entered by :: R. Jullia Mulligan LPN   Activities of Daily Living     08/30/2023    2:00 PM  In your present state of health, do you have any difficulty performing the following activities:  Hearing? 0  Vision? 0  Comment glasses  Difficulty concentrating or making decisions? 0  Walking or climbing stairs? 0  Dressing or bathing? 0  Doing errands, shopping? 0  Preparing Food and eating ? N  Using the Toilet? N  In the past six months, have you accidently leaked urine? Y  Do you have problems with loss of bowel control? N  Managing your Medications? N  Managing your Finances? N  Housekeeping or managing your Housekeeping? N  Patient Care Team: Dale [place], MD as PCP - General (Internal Medicine) End, Cristal Deer, MD as PCP - Cardiology (Cardiology)  Indicate any recent Medical Services you may have received from other than [facility] providers in the past year (date may be approximate).     Assessment:   This is a routine wellness examination for Deborah Baxter.  Hearing/Vision screen Hearing Screening - Comments:: No issues Vision Screening - Comments:: glasses   Goals Addressed             This Visit's Progress    Patient Stated       Wants to continue with physical therapy       Depression Screen     09/03/2023   11:02 AM 09/02/2022   10:26 AM 05/14/2022   10:23 AM 02/06/2022   11:55 AM 12/05/2021   10:13 AM 08/20/2021   11:29 AM 07/31/2021   10:21 AM  PHQ 2/9 Scores  PHQ - 2 Score 0 0 0 0 0 0 0  PHQ- 9 Score 3          Fall Risk     08/30/2023    2:00 PM 09/02/2022   10:32 AM 05/14/2022   10:23 AM 02/06/2022   11:55 AM 12/05/2021   10:13 AM  Fall Risk   Falls in the past year? 0 1 0 0 0  Number falls in past yr: 0 0 0 0   Injury with Fall? 0 0 0 0   Comment  Sprain R ankle when walking. Sought medical care.     Risk for fall due to : No Fall Risks  No Fall Risks No Fall Risks No Fall Risks  Follow up Falls prevention discussed;Falls evaluation completed Falls evaluation completed;Falls prevention discussed Falls evaluation completed Falls evaluation completed Falls evaluation completed    MEDICARE RISK AT HOME:  Medicare Risk at Home Any stairs in or around the home?: (Patient-Rptd) Yes If so, are there any without handrails?: (Patient-Rptd) No Home free of loose throw rugs in walkways, pet beds, electrical cords, etc?: (Patient-Rptd) Yes Adequate lighting in your home to reduce risk of falls?: (Patient-Rptd) Yes Life alert?: (Patient-Rptd) No Use of a cane, walker or w/c?: (Patient-Rptd) Yes Grab bars in the bathroom?: (Patient-Rptd) No Shower chair or bench in shower?: (Patient-Rptd) Yes Elevated toilet seat or a  handicapped toilet?: (Patient-Rptd) No  TIMED UP AND GO:  Was the test performed?  No  Cognitive Function: 6CIT completed    08/12/2017    9:23 AM 08/02/2016   10:42 AM 08/02/2015   10:56 AM  MMSE - Mini Mental State Exam  Orientation to time 5 5 5   Orientation to Place 5 5 5   Registration 3 3 3   Attention/ Calculation 5 5 5   Recall 3 3 3   Language- name 2 objects 2 2 2   Language- repeat 1 1 1   Language- follow 3 step command 3 3 3   Language- read & follow direction 1 1 1   Write a sentence 1 1 1   Copy design 1 1 1   Total score 30 30 30         09/03/2023   11:07 AM 09/02/2022   10:34 AM 08/17/2020    9:53 AM 08/17/2019    9:57 AM 08/14/2018    9:13 AM  6CIT Screen  What Year? 0 points 0 points 0 points 0 points 0 points  What month? 0 points 0 points 0 points 0 points 0 points  What time? 0 points 0 points 0  points 0 points 0 points  Count back from 20 2 points 0 points 0 points 0 points 0 points  Months in reverse 4 points 0 points 0 points 0 points 0 points  Repeat phrase 0 points 0 points 0 points  0 points  Total Score 6 points 0 points 0 points  0 points    Immunizations Immunization History  Administered Date(s) Administered   Fluad Trivalent(High Dose 65+) 05/16/2023   Influenza Split 02/15/2014   Influenza, High Dose Seasonal PF 03/05/2018, 03/16/2019, 04/07/2022   Influenza-Unspecified 03/04/2012, 03/24/2013, 03/08/2014, 02/20/2015, 03/05/2016, 03/11/2017, 03/15/2019, 04/24/2020   Moderna Sars-Covid-2 Vaccination 06/25/2019, 07/23/2019, 05/08/2020   Pneumococcal Conjugate-13 07/08/2013   Pneumococcal Polysaccharide-23 07/12/2014   Zoster Recombinant(Shingrix) 04/13/2019, 09/10/2019    Screening Tests Health Maintenance  Topic Date Due   COVID-19 Vaccine (4 - 2024-25 season) 02/02/2023   Medicare Annual Wellness (AWV)  09/02/2023   MAMMOGRAM  12/27/2023   INFLUENZA VACCINE  01/02/2024   Pneumonia Vaccine 41+ Years old  Completed   DEXA SCAN  Completed    Zoster Vaccines- Shingrix  Completed   HPV VACCINES  Aged Out   DTaP/Tdap/Td  Discontinued    Health Maintenance  Health Maintenance Due  Topic Date Due   COVID-19 Vaccine (4 - 2024-25 season) 02/02/2023   Medicare Annual Wellness (AWV)  09/02/2023   Health Maintenance Items Addressed: Patient declines bone density.  Declines covid vaccine.  Additional Screening:  Vision Screening: Recommended annual ophthalmology exams for early detection of glaucoma and other disorders of the eye. Up to date [facility]  Dental Screening: Recommended annual dental exams for proper oral hygiene  Community Resource Referral / Chronic Care Management: CRR required this visit?  No   CCM required this visit?  No     Plan:     I have personally reviewed and noted the following in the patient's chart:   Medical and social history Use of alcohol, tobacco or illicit drugs  Current medications and supplements including opioid prescriptions. Patient is not currently taking opioid prescriptions. Functional ability and status Nutritional status Physical activity Advanced directives List of other physicians Hospitalizations, surgeries, and ER visits in previous 12 months Vitals Screenings to include cognitive, depression, and falls Referrals and appointments  In addition, I have reviewed and discussed with patient certain preventive protocols, quality metrics, and best practice recommendations. A written personalized care plan for preventive services as well as general preventive health recommendations were provided to patient.     Sydell Axon, LPN   06/08/1094   After Visit Summary: (MyChart) Due to this being a telephonic visit, the after visit summary with patients personalized plan was offered to patient via MyChart   Notes: Nothing significant to report at this time.

## 2023-09-05 ENCOUNTER — Ambulatory Visit: Payer: Medicare Other

## 2023-09-05 DIAGNOSIS — M5459 Other low back pain: Secondary | ICD-10-CM

## 2023-09-05 DIAGNOSIS — M6281 Muscle weakness (generalized): Secondary | ICD-10-CM

## 2023-09-05 DIAGNOSIS — R2681 Unsteadiness on feet: Secondary | ICD-10-CM | POA: Diagnosis not present

## 2023-09-05 DIAGNOSIS — R262 Difficulty in walking, not elsewhere classified: Secondary | ICD-10-CM | POA: Diagnosis not present

## 2023-09-05 NOTE — Therapy (Signed)
 OUTPATIENT PHYSICAL THERAPY TREATMENT  Patient Name: Deborah Baxter MRN: [id number] DOB:1934-03-25, 89 y.o., female Today's Date: 09/05/2023  END OF SESSION:  PT End of Session - 09/05/23 0900     Visit Number 8    Number of Visits 17    Date for PT Re-Evaluation 10/03/23    Authorization Type Medicare A & B    Progress Note Due on Visit 10    PT Start Time 0900    PT Stop Time 0941    PT Time Calculation (min) 41 min    Equipment Utilized During Treatment Gait belt    Activity Tolerance Patient tolerated treatment well    Behavior During Therapy WFL for tasks assessed/performed                   Past Medical History:  Diagnosis Date   (HFpEF) heart failure with preserved ejection fraction (HCC) 04/24/2021   Pulmonary raised concern - question of pulmonary hypertension secondary to DD.  Question of need for right heart cath   A-fib [facility]) 04/24/2021   Acute heart failure with preserved ejection fraction (HCC)    Ascending aorta dilatation (HCC) 08/01/2021   Atrial fibrillation with rapid ventricular response (HCC) 03/20/2021   Congestion of nasal sinus 10/13/2021   Cough 04/23/2021   Diverticulosis 09/05/2012   Colonoscopy 08/31/12 - redundant colon and diverticulosis in the sigmoid colon, descending colon, transverse colon and hepatic flexure.  Recommend f/u colonoscopy in 10 years.    Elevated bilirubin 09/13/2021   Elevated blood pressure reading 12/05/2020   Health care maintenance 07/13/2014   Physical 07/12/14.  Colonoscopy 08/31/12.  Normal bone density 04/06/10.  Mammogram 05/30/20- Birads I.     Hx of degenerative disc disease    Hypercholesterolemia    Hypertensive urgency 03/20/2021   Ingrown toenail 09/13/2021   Left hand pain 03/20/2021   Low back pain 01/29/2021   Lumbago 07/13/2014   Lumbar spinal stenosis 06/27/2012   Neuromuscular disorder (HCC)    pinched nerve   Numbness and tingling in left hand 08/21/2017   Osteoarthritis    Pleural effusion 09/13/2021    Pulmonary edema 03/20/2021   Seasonal allergies    Spinal stenosis    chronic back and leg pain   Stress 02/03/2021   Syncope 12/05/2020   Thrombocytopenia (HCC) 09/13/2021   Past Surgical History:  Procedure Laterality Date   APPENDECTOMY     bladder tack surgery  2000   BTL  1976   CARDIOVERSION N/A 05/11/2021   Procedure: CARDIOVERSION;  Surgeon: Antonieta Iba, MD;  Location: [facility] ORS;  Service: Cardiovascular;  Laterality: N/A;   CARPAL TUNNEL RELEASE     CATARACT EXTRACTION W/PHACO Left 12/07/2015   Procedure: CATARACT EXTRACTION PHACO AND INTRAOCULAR LENS PLACEMENT (IOC);  Surgeon: Nevada Crane, MD;  Location: [facility] ORS;  Service: Ophthalmology;  Laterality: Left;  [place] 1.23 AP% 23.2 CDE 19.28 Fluid Pack Lot # [id number]   EYE SURGERY  03/19/15   GANGLION CYST EXCISION     right hand   GAS/FLUID EXCHANGE Left 03/08/2015   Procedure: GAS/FLUID EXCHANGE;  Surgeon: Marcelene Butte, MD;  Location: [facility] ORS;  Service: Ophthalmology;  Laterality: Left;   RIGHT HEART CATH N/A 11/06/2021   Procedure: RIGHT HEART CATH;  Surgeon: Yvonne Kendall, MD;  Location: [facility] INVASIVE CV [facility];  Service: Cardiovascular;  Laterality: N/A;   TONSILLECTOMY     TUBAL LIGATION     VITRECTOMY 25 GAUGE WITH SCLERAL BUCKLE Left 03/08/2015   Procedure:  VITRECTOMY 25 GAUGE, membrane peel, 26 %  SF6 gas exchange;  Surgeon: Marcelene Butte, MD;  Location: [facility] ORS;  Service: Ophthalmology;  Laterality: Left;  casette lot # [id number]   Patient Active Problem List   Diagnosis Date Noted   Unsteady gait 05/16/2023   Arm pain 08/18/2022   Hx of degenerative disc disease 10/23/2021   Osteoarthritis 10/23/2021   Seasonal allergies 10/23/2021   Spinal stenosis 10/23/2021   Elevated bilirubin 09/13/2021   Thrombocytopenia (HCC) 09/13/2021   Ingrown toenail 09/13/2021   Ascending aorta dilatation (HCC) 08/01/2021   A-fib (HCC) 04/24/2021   (HFpEF) heart failure with preserved ejection fraction (HCC) 04/24/2021    Acute heart failure with preserved ejection fraction [facility])    Atrial fibrillation with rapid ventricular response (HCC) 03/20/2021   Left hand pain 03/20/2021   Stress 02/03/2021   Low back pain 01/29/2021   Syncope 12/05/2020   Elevated blood pressure reading 12/05/2020   Numbness and tingling in left hand 08/21/2017   Lumbago 07/13/2014   Health care maintenance 07/13/2014   Diverticulosis 09/05/2012   Hypercholesterolemia 06/27/2012   Lumbar spinal stenosis 06/27/2012    PCP: Dale Elgin, MD  REFERRING PROVIDER: Dale McCool, MD  REFERRING DIAG: R26.81 (ICD-10-CM) - Unsteady [phone] (ICD-10-CM) - Right-sided low back pain with right-sided sciatica, unspecified chronicity  Rationale for Evaluation and Treatment: Rehabilitation  THERAPY DIAG:  Other low back pain  Difficulty in walking, not elsewhere classified  Unsteadiness on feet  Muscle weakness (generalized)  ONSET DATE: 05/16/2023 (date PT referral was signed)  SUBJECTIVE:                                                                                                                                                                                           SUBJECTIVE STATEMENT: Back is good, about 2-3/10 currently. Feels more steady when she walks.    PERTINENT HISTORY:  R sided low back pain, and unsteady gait. Chronic, which progressively worsened over the years. Has gone to a chiropractor for a while which stopped helping. Has not had back surgery. No osteoporosis to her knowledge. Balance is also not as good, such as when standing. Larey Seat a long time ago 50 years ago which bothered her R low back. Back pain has remained the same since several years ago. Feels R LE radiating symptoms to her foot (L5 dermatome on R)   Blood pressure is controlled per pt.   No latex allergies     PAIN:  Are you having pain? Yes: NPRS scale: 4/10 Pain location: R low back/posterior hip Pain description: catching;  dull pain Aggravating factors: weather, picking up sticks  in the yard (daughter states pt has a very high tolerance for pain) Relieving factors: ice pack, sitting eases back pain, tylenol  PRECAUTIONS: Fall risk  RED FLAGS: Bowel or bladder incontinence: Yes: Bladder, doctor Scott aware. and Cauda equina syndrome: No   WEIGHT BEARING RESTRICTIONS: No  FALLS:  Has patient fallen in last 6 months? No  LIVING ENVIRONMENT: Lives with: lives alone Lives in: House/apartment Stairs: Yes: Internal: about 12 to the basement to do laundry 2x/week steps; L rail going down to the basement and External: 2 steps; on right going up Has following equipment at home: Single point cane and Shower bench  OCCUPATION: retired  PLOF: Requires assistive device for independence  PATIENT GOALS: Be without the pain. Improve balance.   NEXT MD VISIT: Yes but pt does not know. Has a wellness visit.   OBJECTIVE:  Note: Objective measures were completed at Evaluation unless otherwise noted.  DIAGNOSTIC FINDINGS:  DG Lumbar Spine 2-3 Views 01/30/2024  Narrative & Impression  CLINICAL DATA:  Low back pain, radiating down right leg   EXAM: LUMBAR SPINE - 2-3 VIEW   COMPARISON:  2019   FINDINGS: Alignment is similar with levoscoliosis. Diffusely decreased osseous mineralization in combination with overlying bowel gas limits evaluation. Vertebral body heights appear similar. Multilevel disc space narrowing, endplate osteophytes, and facet hypertrophy.   IMPRESSION: Suboptimal evaluation due to demineralization. Multilevel spondylosis superimposed on scoliosis similar to prior study.     Electronically Signed   By: Guadlupe Spanish M.D.   On: 01/30/2021 09:09    PATIENT SURVEYS:  Modified Oswestry 9/50 (18%) (08/06/2023)   COGNITION: Overall cognitive status: Within functional limits for tasks assessed     SENSATION:   MUSCLE LENGTH:    POSTURE: forward neck B protracted shoulders, R  shoulder lower, forward flexed, R iliac crest and greater trochanter higher, L lumbar convexity around L3, L knee in slight flexion, R knee genu valgus.   PALPATION: TTP to R posterior hip/piriformis (proximal attachment and muscle belly), TTP around R posterior hip around Sciatic nerve.  LUMBAR ROM:   AROM eval  Flexion WFL with R low back pain  Extension Limited with low back pain  Right lateral flexion WFL  Left lateral flexion WFL  Right rotation [facility] with R low back pain  Left rotation WFL   (Blank rows = not tested)  LOWER EXTREMITY ROM:     Passive  Right (09/05/2023) Left (09/05/2023)  Hip flexion    Hip extension    Hip abduction    Hip adduction    Hip internal rotation WFL Limited with reproduction of R posterior hip pain  Hip external rotation    Knee flexion    Knee extension    Ankle dorsiflexion    Ankle plantarflexion    Ankle inversion    Ankle eversion     (Blank rows = not tested)  LOWER EXTREMITY MMT:    MMT Right eval Left eval  Hip flexion 4- 4-  Hip extension (seated manually resisted) 3 3+  Hip abduction (seated manually resisted) 4 4  Hip adduction    Hip internal rotation    Hip external rotation 4 4-  Knee flexion 4 4  Knee extension 4+ 5  Ankle dorsiflexion    Ankle plantarflexion    Ankle inversion    Ankle eversion     (Blank rows = not tested)  LUMBAR SPECIAL TESTS:    FUNCTIONAL TESTS:  5 times sit to stand: unable to test  at this time Timed up and go (TUG): 18.24 seconds; 17.24 s, 17.88 seconds with SPC on L; (17.8 seconds average)  Dynamic Gait Index: unable to test at this time Decreased femoral control R with STS   GAIT: Distance walked: 30 ft Assistive device utilized: Single point cane Level of assistance: Modified independence Comments: Antalic, decreased stance R LE, SPC on R, R knee genu valgus during stance phase of gait  TREATMENT DATE: 09/05/2023                                                                                                                                Therapeutic Exercies  Reclined lower trunk rotation 10x3    Hooklying    SKTC     R 10x5 seconds     L 10x5 seconds for 2 sets  Supine L hip IR stretch with PT 1 min for 3 sets  R posterior hip numbness which eases with rest  Supine L piriformis stretch 30 seconds x 3  Possible decrease in R posterior hip pain  Supine with L hip in 90/90, PROM L hip IR 10x3 with PT  Seated L hip IR 10x5 seconds for 3 sets   Improved exercise technique, movement at target joints, use of target muscles after mod verbal, visual, tactile cues.          PATIENT EDUCATION:  Education details: there-ex, HEP Person educated: Patient, Child(ren), and Estate manager/land agent Education method: Explanation, Demonstration, Tactile cues, Verbal cues, and Handouts Education comprehension: verbalized understanding and returned demonstration  HOME EXERCISE PROGRAM: Access Code:  URL: [email or web address] Date: 08/08/2023 Prepared by: Loralyn Freshwater  Exercises - Seated Flexion Stretch  - 3 x daily - 7 x weekly - 3 sets - 10 reps - 5 seconds hold - Seated Transversus Abdominis Bracing  - 3 x daily - 7 x weekly - 3 sets - 10 reps - 5 seconds hold - Seated Gluteal Sets  - 3 x daily - 7 x weekly - 3 sets - 10 reps - 5 seconds hold  - Hooklying Clamshell with Resistance  - 1 x daily - 7 x weekly - 6 sets - 5 reps  Yellow band  Hooklying Hip extension isometrics, leg straight    R 10x5 seconds for 3 sets    L 10x5 seconds for 3 sets   - Supine Piriformis Stretch  - 3 x daily - 7 x weekly - 1 sets - 5 reps - 30 seconds hold     ASSESSMENT:  CLINICAL IMPRESSION: Improving steadiness with gait based on subjective reports. Reproduction of R posterior hip symptoms with supine L hip IR PROM. Worked on improving L hip IR ROM to help address. Pt tolerated session well without aggravation of symptoms. Pt will benefit from  continued skilled physical therapy services to decrease pain, improve strength, balance, and function.        OBJECTIVE IMPAIRMENTS: Abnormal gait, decreased balance, difficulty walking, decreased  strength, improper body mechanics, postural dysfunction, and pain.   ACTIVITY LIMITATIONS: carrying, lifting, bending, standing, squatting, stairs, transfers, and locomotion level  PARTICIPATION LIMITATIONS:   PERSONAL FACTORS: Age, Fitness, Time since onset of injury/illness/exacerbation, and 3+ comorbidities: heart failure, A-fib, ascending aorta dilation, osteoarthritis, syncope  are also ; affecting patient's functional outcome.   REHAB POTENTIAL: Fair    CLINICAL DECISION MAKING: Stable/uncomplicated  EVALUATION COMPLEXITY: Low   GOALS: Goals reviewed with patient? Yes  SHORT TERM GOALS: Target date: 08/22/2023  Pt will be independent with her initial HEP to decrease pain, improve strength, balance, and function.  Baseline: Pt has not yet started her initial HEP (08/06/2023)  Goal status: INITIAL    LONG TERM GOALS: Target date: 10/03/2023  Pt will have a decrease in low back pain to 3/10 or less at worst to promote ability to perform standing tasks such as yard work more comfortably.  Baseline: 7/10 low back pain at worst for the past 3 months (08/06/2023) Goal status: INITIAL  2.  Pt will improve her Modified Oswestry Low Back Pain Disability Questionnaire to 10% or less as a demonstration of improved function.  Baseline: Modified Oswestry 9/50 (18%) (08/06/2023)  Goal status: INITIAL  3.  Pt will improve her B hip extension, ER, and abduction strength by at least 1/2 MMT grade to promote ability to perform closed chain tasks more comfortably for her back.  Baseline:  MMT Right eval Left eval  Hip extension (seated manually resisted) 3 3+  Hip abduction (seated manually resisted) 4 4  Hip external rotation 4 4-   (08/06/2023)  Goal status: INITIAL  4.  Pt will improver her  Timed Up and Go time to 12 seconds or less with her [facility] as a demonstration of improved functional mobility  Baseline: 17.8 seconds average with SPC on L side (08/06/2023) Goal status: INITIAL    PLAN:  PT FREQUENCY: 2x/week  PT DURATION: 8 weeks  PLANNED INTERVENTIONS: 97110-Therapeutic exercises, 97530- Therapeutic activity, 97112- Neuromuscular re-education, 97535- Self Care, [id number]- Manual therapy, [id number]- Gait training, [phone]- Canalith repositioning, [id number]- Electrical stimulation (unattended), [phone]- Ionotophoresis 4mg /ml Dexamethasone, Patient/Family education, Balance training, Stair training, Dry Needling, Joint mobilization, and Vestibular training.  PLAN FOR NEXT SESSION: Posture, trunk and hip strength, femoral control, functional strengthening, balance, manual techniques, modalities PRN   Donell Tomkins, PT, DPT 09/05/2023, 11:32 AM

## 2023-09-09 ENCOUNTER — Ambulatory Visit: Payer: Medicare Other

## 2023-09-09 DIAGNOSIS — R262 Difficulty in walking, not elsewhere classified: Secondary | ICD-10-CM | POA: Diagnosis not present

## 2023-09-09 DIAGNOSIS — M6281 Muscle weakness (generalized): Secondary | ICD-10-CM

## 2023-09-09 DIAGNOSIS — R2681 Unsteadiness on feet: Secondary | ICD-10-CM | POA: Diagnosis not present

## 2023-09-09 DIAGNOSIS — M5459 Other low back pain: Secondary | ICD-10-CM | POA: Diagnosis not present

## 2023-09-09 NOTE — Therapy (Signed)
 OUTPATIENT PHYSICAL THERAPY TREATMENT  Patient Name: Deborah Baxter MRN: [id number] DOB:[birth year], 89 y.o., female Today's Date: 09/09/2023  END OF SESSION:  PT End of Session - 09/09/23 0948     Visit Number 9    Number of Visits 17    Date for PT Re-Evaluation 10/03/23    Authorization Type Medicare A & B    Progress Note Due on Visit 10    PT Start Time [phone]    PT Stop Time 1028    PT Time Calculation (min) 40 min    Equipment Utilized During Treatment Gait belt    Activity Tolerance Patient tolerated treatment well    Behavior During Therapy WFL for tasks assessed/performed                    Past Medical History:  Diagnosis Date   (HFpEF) heart failure with preserved ejection fraction (HCC) 04/24/2021   Pulmonary raised concern - question of pulmonary hypertension secondary to DD.  Question of need for right heart cath   A-fib [facility]) 04/24/2021   Acute heart failure with preserved ejection fraction (HCC)    Ascending aorta dilatation (HCC) 08/01/2021   Atrial fibrillation with rapid ventricular response (HCC) 03/20/2021   Congestion of nasal sinus 10/13/2021   Cough 04/23/2021   Diverticulosis 09/05/2012   Colonoscopy 08/31/12 - redundant colon and diverticulosis in the sigmoid colon, descending colon, transverse colon and hepatic flexure.  Recommend f/u colonoscopy in 10 years.    Elevated bilirubin 09/13/2021   Elevated blood pressure reading 12/05/2020   Health care maintenance 07/13/2014   Physical 07/12/14.  Colonoscopy 08/31/12.  Normal bone density 04/06/10.  Mammogram 05/30/20- Birads I.     Hx of degenerative disc disease    Hypercholesterolemia    Hypertensive urgency 03/20/2021   Ingrown toenail 09/13/2021   Left hand pain 03/20/2021   Low back pain 01/29/2021   Lumbago 07/13/2014   Lumbar spinal stenosis 06/27/2012   Neuromuscular disorder (HCC)    pinched nerve   Numbness and tingling in left hand 08/21/2017   Osteoarthritis    Pleural effusion 09/13/2021    Pulmonary edema 03/20/2021   Seasonal allergies    Spinal stenosis    chronic back and leg pain   Stress 02/03/2021   Syncope 12/05/2020   Thrombocytopenia (HCC) 09/13/2021   Past Surgical History:  Procedure Laterality Date   APPENDECTOMY     bladder tack surgery  2000   BTL  1976   CARDIOVERSION N/A 05/11/2021   Procedure: CARDIOVERSION;  Surgeon: Antonieta Iba, MD;  Location: [facility] ORS;  Service: Cardiovascular;  Laterality: N/A;   CARPAL TUNNEL RELEASE     CATARACT EXTRACTION W/PHACO Left 12/07/2015   Procedure: CATARACT EXTRACTION PHACO AND INTRAOCULAR LENS PLACEMENT (IOC);  Surgeon: Nevada Crane, MD;  Location: [facility] ORS;  Service: Ophthalmology;  Laterality: Left;  [place] 1.23 AP% 23.2 CDE 19.28 Fluid Pack Lot # [id number]   EYE SURGERY  03/19/15   GANGLION CYST EXCISION     right hand   GAS/FLUID EXCHANGE Left 03/08/2015   Procedure: GAS/FLUID EXCHANGE;  Surgeon: Marcelene Butte, MD;  Location: [facility] ORS;  Service: Ophthalmology;  Laterality: Left;   RIGHT HEART CATH N/A 11/06/2021   Procedure: RIGHT HEART CATH;  Surgeon: Yvonne Kendall, MD;  Location: [facility] INVASIVE CV [facility];  Service: Cardiovascular;  Laterality: N/A;   TONSILLECTOMY     TUBAL LIGATION     VITRECTOMY 25 GAUGE WITH SCLERAL BUCKLE Left 03/08/2015  Procedure: VITRECTOMY 25 GAUGE, membrane peel, 26 %  SF6 gas exchange;  Surgeon: Marcelene Butte, MD;  Location: [facility] ORS;  Service: Ophthalmology;  Laterality: Left;  casette lot # [id number]   Patient Active Problem List   Diagnosis Date Noted   Unsteady gait 05/16/2023   Arm pain 08/18/2022   Hx of degenerative disc disease 10/23/2021   Osteoarthritis 10/23/2021   Seasonal allergies 10/23/2021   Spinal stenosis 10/23/2021   Elevated bilirubin 09/13/2021   Thrombocytopenia (HCC) 09/13/2021   Ingrown toenail 09/13/2021   Ascending aorta dilatation (HCC) 08/01/2021   A-fib (HCC) 04/24/2021   (HFpEF) heart failure with preserved ejection fraction (HCC) 04/24/2021    Acute heart failure with preserved ejection fraction [facility])    Atrial fibrillation with rapid ventricular response (HCC) 03/20/2021   Left hand pain 03/20/2021   Stress 02/03/2021   Low back pain 01/29/2021   Syncope 12/05/2020   Elevated blood pressure reading 12/05/2020   Numbness and tingling in left hand 08/21/2017   Lumbago 07/13/2014   Health care maintenance 07/13/2014   Diverticulosis 09/05/2012   Hypercholesterolemia 06/27/2012   Lumbar spinal stenosis 06/27/2012    PCP: Dale Renningers, MD  REFERRING PROVIDER: Dale [place], MD  REFERRING DIAG: R26.81 (ICD-10-CM) - Unsteady [phone] (ICD-10-CM) - Right-sided low back pain with right-sided sciatica, unspecified chronicity  Rationale for Evaluation and Treatment: Rehabilitation  THERAPY DIAG:  Other low back pain  Difficulty in walking, not elsewhere classified  Unsteadiness on feet  Muscle weakness (generalized)  ONSET DATE: 05/16/2023 (date PT referral was signed)  SUBJECTIVE:                                                                                                                                                                                           SUBJECTIVE STATEMENT: R low back hip is about the same. The supine piriformis stretch for his R side feels like a bone rubbing. 2-3/10 R low back/posterior hip pain currently.      PERTINENT HISTORY:  R sided low back pain, and unsteady gait. Chronic, which progressively worsened over the years. Has gone to a chiropractor for a while which stopped helping. Has not had back surgery. No osteoporosis to her knowledge. Balance is also not as good, such as when standing. Larey Seat a long time ago 50 years ago which bothered her R low back. Back pain has remained the same since several years ago. Feels R LE radiating symptoms to her foot (L5 dermatome on R)   Blood pressure is controlled per pt.   No latex allergies     PAIN:  Are you having pain? Yes:  NPRS  scale: 4/10 Pain location: R low back/posterior hip Pain description: catching; dull pain Aggravating factors: weather, picking up sticks in the yard (daughter states pt has a very high tolerance for pain) Relieving factors: ice pack, sitting eases back pain, tylenol  PRECAUTIONS: Fall risk  RED FLAGS: Bowel or bladder incontinence: Yes: Bladder, doctor Scott aware. and Cauda equina syndrome: No   WEIGHT BEARING RESTRICTIONS: No  FALLS:  Has patient fallen in last 6 months? No  LIVING ENVIRONMENT: Lives with: lives alone Lives in: House/apartment Stairs: Yes: Internal: about 12 to the basement to do laundry 2x/week steps; L rail going down to the basement and External: 2 steps; on right going up Has following equipment at home: Single point cane and Shower bench  OCCUPATION: retired  PLOF: Requires assistive device for independence  PATIENT GOALS: Be without the pain. Improve balance.   NEXT MD VISIT: Yes but pt does not know. Has a wellness visit.   OBJECTIVE:  Note: Objective measures were completed at Evaluation unless otherwise noted.  DIAGNOSTIC FINDINGS:  DG Lumbar Spine 2-3 Views 01/30/2024  Narrative & Impression  CLINICAL DATA:  Low back pain, radiating down right leg   EXAM: LUMBAR SPINE - 2-3 VIEW   COMPARISON:  2019   FINDINGS: Alignment is similar with levoscoliosis. Diffusely decreased osseous mineralization in combination with overlying bowel gas limits evaluation. Vertebral body heights appear similar. Multilevel disc space narrowing, endplate osteophytes, and facet hypertrophy.   IMPRESSION: Suboptimal evaluation due to demineralization. Multilevel spondylosis superimposed on scoliosis similar to prior study.     Electronically Signed   By: Guadlupe Spanish M.D.   On: 01/30/2021 09:09    PATIENT SURVEYS:  Modified Oswestry 9/50 (18%) (08/06/2023)   COGNITION: Overall cognitive status: Within functional limits for tasks  assessed     SENSATION:   MUSCLE LENGTH:    POSTURE: forward neck B protracted shoulders, R shoulder lower, forward flexed, R iliac crest and greater trochanter higher, L lumbar convexity around L3, L knee in slight flexion, R knee genu valgus.   PALPATION: TTP to R posterior hip/piriformis (proximal attachment and muscle belly), TTP around R posterior hip around Sciatic nerve.  LUMBAR ROM:   AROM eval  Flexion WFL with R low back pain  Extension Limited with low back pain  Right lateral flexion WFL  Left lateral flexion WFL  Right rotation [facility] with R low back pain  Left rotation WFL   (Blank rows = not tested)  LOWER EXTREMITY ROM:     Passive  Right (09/05/2023) Left (09/05/2023)  Hip flexion    Hip extension    Hip abduction    Hip adduction    Hip internal rotation WFL Limited with reproduction of R posterior hip pain  Hip external rotation    Knee flexion    Knee extension    Ankle dorsiflexion    Ankle plantarflexion    Ankle inversion    Ankle eversion     (Blank rows = not tested)  LOWER EXTREMITY MMT:    MMT Right eval Left eval  Hip flexion 4- 4-  Hip extension (seated manually resisted) 3 3+  Hip abduction (seated manually resisted) 4 4  Hip adduction    Hip internal rotation    Hip external rotation 4 4-  Knee flexion 4 4  Knee extension 4+ 5  Ankle dorsiflexion    Ankle plantarflexion    Ankle inversion    Ankle eversion     (Blank rows = not  tested)  LUMBAR SPECIAL TESTS:    FUNCTIONAL TESTS:  5 times sit to stand: unable to test at this time Timed up and go (TUG): 18.24 seconds; 17.24 s, 17.88 seconds with SPC on L; (17.8 seconds average)  Dynamic Gait Index: unable to test at this time Decreased femoral control R with STS   GAIT: Distance walked: 30 ft Assistive device utilized: Single point cane Level of assistance: Modified independence Comments: Antalic, decreased stance R LE, SPC on R, R knee genu valgus during stance phase  of gait  TREATMENT DATE: 09/09/2023                                                                                                                               Therapeutic Exercies  Supine L hip IR stretch with PT 1 min for 3 sets  Pt was recommended to not to do the piriformis stretch for her R side. Pt verbalized understanding  Supine with L hip in 90/90, PROM L hip IR 10x3 with PT  Decreased R low back pain after aforementioned exercises  Seated L hip IR 10x, then 10x5 seconds for 3 sets   Improved exercise technique, movement at target joints, use of target muscles after mod verbal, visual, tactile cues.    Therapeutic activities  Stand to sit onto regular chair (B UE assist to stand, no UE assist to sit 5x3  Standing static mini lunge with contralateral UE assist   Cues for femoral control   R 5x3  L 5x  Forward step up onto 4 inch step with B UE assist   alternating  R 5x3  L 5x3   Standing with B UE assist   SLS   R 10x5 seconds    L 10x5 seconds      Improved exercise technique, movement at target joints, use of target muscles after mod verbal, visual, tactile cues.          PATIENT EDUCATION:  Education details: there-ex, HEP Person educated: Patient, Child(ren), and Estate manager/land agent Education method: Explanation, Demonstration, Tactile cues, Verbal cues, and Handouts Education comprehension: verbalized understanding and returned demonstration  HOME EXERCISE PROGRAM: Access Code:  URL: [email or web address] Date: 08/08/2023 Prepared by: Loralyn Freshwater  Exercises - Seated Flexion Stretch  - 3 x daily - 7 x weekly - 3 sets - 10 reps - 5 seconds hold - Seated Transversus Abdominis Bracing  - 3 x daily - 7 x weekly - 3 sets - 10 reps - 5 seconds hold - Seated Gluteal Sets  - 3 x daily - 7 x weekly - 3 sets - 10 reps - 5 seconds hold  - Hooklying Clamshell with Resistance  - 1 x daily - 7 x weekly - 6 sets - 5  reps  Yellow band  Hooklying Hip extension isometrics, leg straight    R 10x5 seconds for 3 sets    L 10x5 seconds for 3 sets   - Supine  Piriformis Stretch  - 3 x daily - 7 x weekly - 1 sets - 5 reps - 30 seconds hold     ASSESSMENT:  CLINICAL IMPRESSION: Slight decrease in R low back/posterior hip pain with exercises to improve L hip IR ROM. Continued working on functional LE strengthening with emphasis on femoral control to improve ability to perform closed chain tasks with less difficulty. Pt tolerated session well without aggravation of symptoms. Pt will benefit from continued skilled physical therapy services to decrease pain, improve strength, balance, and function.        OBJECTIVE IMPAIRMENTS: Abnormal gait, decreased balance, difficulty walking, decreased strength, improper body mechanics, postural dysfunction, and pain.   ACTIVITY LIMITATIONS: carrying, lifting, bending, standing, squatting, stairs, transfers, and locomotion level  PARTICIPATION LIMITATIONS:   PERSONAL FACTORS: Age, Fitness, Time since onset of injury/illness/exacerbation, and 3+ comorbidities: heart failure, A-fib, ascending aorta dilation, osteoarthritis, syncope  are also ; affecting patient's functional outcome.   REHAB POTENTIAL: Fair    CLINICAL DECISION MAKING: Stable/uncomplicated  EVALUATION COMPLEXITY: Low   GOALS: Goals reviewed with patient? Yes  SHORT TERM GOALS: Target date: 08/22/2023  Pt will be independent with her initial HEP to decrease pain, improve strength, balance, and function.  Baseline: Pt has not yet started her initial HEP (08/06/2023)  Goal status: INITIAL    LONG TERM GOALS: Target date: 10/03/2023  Pt will have a decrease in low back pain to 3/10 or less at worst to promote ability to perform standing tasks such as yard work more comfortably.  Baseline: 7/10 low back pain at worst for the past 3 months (08/06/2023) Goal status: INITIAL  2.  Pt will improve her  Modified Oswestry Low Back Pain Disability Questionnaire to 10% or less as a demonstration of improved function.  Baseline: Modified Oswestry 9/50 (18%) (08/06/2023)  Goal status: INITIAL  3.  Pt will improve her B hip extension, ER, and abduction strength by at least 1/2 MMT grade to promote ability to perform closed chain tasks more comfortably for her back.  Baseline:  MMT Right eval Left eval  Hip extension (seated manually resisted) 3 3+  Hip abduction (seated manually resisted) 4 4  Hip external rotation 4 4-   (08/06/2023)  Goal status: INITIAL  4.  Pt will improver her Timed Up and Go time to 12 seconds or less with her [facility] as a demonstration of improved functional mobility  Baseline: 17.8 seconds average with SPC on L side (08/06/2023) Goal status: INITIAL    PLAN:  PT FREQUENCY: 2x/week  PT DURATION: 8 weeks  PLANNED INTERVENTIONS: 97110-Therapeutic exercises, 97530- Therapeutic activity, 97112- Neuromuscular re-education, 97535- Self Care, [id number]- Manual therapy, [id number]- Gait training, [phone]- Canalith repositioning, [id number]- Electrical stimulation (unattended), [phone]- Ionotophoresis 4mg /ml Dexamethasone, Patient/Family education, Balance training, Stair training, Dry Needling, Joint mobilization, and Vestibular training.  PLAN FOR NEXT SESSION: Posture, trunk and hip strength, femoral control, functional strengthening, balance, manual techniques, modalities PRN   Nyari Olsson, PT, DPT 09/09/2023, 10:41 AM

## 2023-09-12 ENCOUNTER — Ambulatory Visit: Payer: Medicare Other

## 2023-09-12 DIAGNOSIS — M5459 Other low back pain: Secondary | ICD-10-CM

## 2023-09-12 DIAGNOSIS — M6281 Muscle weakness (generalized): Secondary | ICD-10-CM | POA: Diagnosis not present

## 2023-09-12 DIAGNOSIS — R2681 Unsteadiness on feet: Secondary | ICD-10-CM

## 2023-09-12 DIAGNOSIS — R262 Difficulty in walking, not elsewhere classified: Secondary | ICD-10-CM

## 2023-09-12 NOTE — Therapy (Signed)
 OUTPATIENT PHYSICAL THERAPY TREATMENT And Progress Report (08/06/2023 - 09/12/2023)  Patient Name: Deborah Baxter MRN: [id number] DOB:[birth year], 89 y.o., female Today's Date: 09/12/2023  END OF SESSION:  PT End of Session - 09/12/23 0951     Visit Number 10    Number of Visits 17    Date for PT Re-Evaluation 10/03/23    Authorization Type Medicare A & B    Progress Note Due on Visit 10    PT Start Time [phone]    PT Stop Time 1029    PT Time Calculation (min) 38 min    Equipment Utilized During Treatment Gait belt    Activity Tolerance Patient tolerated treatment well    Behavior During Therapy WFL for tasks assessed/performed                     Past Medical History:  Diagnosis Date   (HFpEF) heart failure with preserved ejection fraction (HCC) 04/24/2021   Pulmonary raised concern - question of pulmonary hypertension secondary to DD.  Question of need for right heart cath   A-fib [facility]) 04/24/2021   Acute heart failure with preserved ejection fraction (HCC)    Ascending aorta dilatation (HCC) 08/01/2021   Atrial fibrillation with rapid ventricular response (HCC) 03/20/2021   Congestion of nasal sinus 10/13/2021   Cough 04/23/2021   Diverticulosis 09/05/2012   Colonoscopy 08/31/12 - redundant colon and diverticulosis in the sigmoid colon, descending colon, transverse colon and hepatic flexure.  Recommend f/u colonoscopy in 10 years.    Elevated bilirubin 09/13/2021   Elevated blood pressure reading 12/05/2020   Health care maintenance 07/13/2014   Physical 07/12/14.  Colonoscopy 08/31/12.  Normal bone density 04/06/10.  Mammogram 05/30/20- Birads I.     Hx of degenerative disc disease    Hypercholesterolemia    Hypertensive urgency 03/20/2021   Ingrown toenail 09/13/2021   Left hand pain 03/20/2021   Low back pain 01/29/2021   Lumbago 07/13/2014   Lumbar spinal stenosis 06/27/2012   Neuromuscular disorder (HCC)    pinched nerve   Numbness and tingling in left hand 08/21/2017    Osteoarthritis    Pleural effusion 09/13/2021   Pulmonary edema 03/20/2021   Seasonal allergies    Spinal stenosis    chronic back and leg pain   Stress 02/03/2021   Syncope 12/05/2020   Thrombocytopenia (HCC) 09/13/2021   Past Surgical History:  Procedure Laterality Date   APPENDECTOMY     bladder tack surgery  2000   BTL  1976   CARDIOVERSION N/A 05/11/2021   Procedure: CARDIOVERSION;  Surgeon: Antonieta Iba, MD;  Location: [facility] ORS;  Service: Cardiovascular;  Laterality: N/A;   CARPAL TUNNEL RELEASE     CATARACT EXTRACTION W/PHACO Left 12/07/2015   Procedure: CATARACT EXTRACTION PHACO AND INTRAOCULAR LENS PLACEMENT (IOC);  Surgeon: Nevada Crane, MD;  Location: [facility] ORS;  Service: Ophthalmology;  Laterality: Left;  [place] 1.23 AP% 23.2 CDE 19.28 Fluid Pack Lot # [id number]   EYE SURGERY  03/19/15   GANGLION CYST EXCISION     right hand   GAS/FLUID EXCHANGE Left 03/08/2015   Procedure: GAS/FLUID EXCHANGE;  Surgeon: Marcelene Butte, MD;  Location: [facility] ORS;  Service: Ophthalmology;  Laterality: Left;   RIGHT HEART CATH N/A 11/06/2021   Procedure: RIGHT HEART CATH;  Surgeon: Yvonne Kendall, MD;  Location: [facility] INVASIVE CV [facility];  Service: Cardiovascular;  Laterality: N/A;   TONSILLECTOMY     TUBAL LIGATION     VITRECTOMY 25 GAUGE  WITH SCLERAL BUCKLE Left 03/08/2015   Procedure: VITRECTOMY 25 GAUGE, membrane peel, 26 %  SF6 gas exchange;  Surgeon: Marcelene Butte, MD;  Location: [facility] ORS;  Service: Ophthalmology;  Laterality: Left;  casette lot # [id number]   Patient Active Problem List   Diagnosis Date Noted   Unsteady gait 05/16/2023   Arm pain 08/18/2022   Hx of degenerative disc disease 10/23/2021   Osteoarthritis 10/23/2021   Seasonal allergies 10/23/2021   Spinal stenosis 10/23/2021   Elevated bilirubin 09/13/2021   Thrombocytopenia (HCC) 09/13/2021   Ingrown toenail 09/13/2021   Ascending aorta dilatation (HCC) 08/01/2021   A-fib (HCC) 04/24/2021   (HFpEF) heart failure  with preserved ejection fraction (HCC) 04/24/2021   Acute heart failure with preserved ejection fraction [facility])    Atrial fibrillation with rapid ventricular response (HCC) 03/20/2021   Left hand pain 03/20/2021   Stress 02/03/2021   Low back pain 01/29/2021   Syncope 12/05/2020   Elevated blood pressure reading 12/05/2020   Numbness and tingling in left hand 08/21/2017   Lumbago 07/13/2014   Health care maintenance 07/13/2014   Diverticulosis 09/05/2012   Hypercholesterolemia 06/27/2012   Lumbar spinal stenosis 06/27/2012    PCP: Dale Lafayette, MD  REFERRING PROVIDER: Dale Sturgis, MD  REFERRING DIAG: R26.81 (ICD-10-CM) - Unsteady [phone] (ICD-10-CM) - Right-sided low back pain with right-sided sciatica, unspecified chronicity  Rationale for Evaluation and Treatment: Rehabilitation  THERAPY DIAG:  Other low back pain  Difficulty in walking, not elsewhere classified  Unsteadiness on feet  Muscle weakness (generalized)  ONSET DATE: 05/16/2023 (date PT referral was signed)  SUBJECTIVE:                                                                                                                                                                                           SUBJECTIVE STATEMENT: R low back hip is not intense, more of a dull feeling. 1-2/10 currently. Daughter states that pt's balance is better.        PERTINENT HISTORY:  R sided low back pain, and unsteady gait. Chronic, which progressively worsened over the years. Has gone to a chiropractor for a while which stopped helping. Has not had back surgery. No osteoporosis to her knowledge. Balance is also not as good, such as when standing. Larey Seat a long time ago 50 years ago which bothered her R low back. Back pain has remained the same since several years ago. Feels R LE radiating symptoms to her foot (L5 dermatome on R)   Blood pressure is controlled per pt.   No latex allergies     PAIN:  Are you  having pain?  Yes: NPRS scale: 4/10 Pain location: R low back/posterior hip Pain description: catching; dull pain Aggravating factors: weather, picking up sticks in the yard (daughter states pt has a very high tolerance for pain) Relieving factors: ice pack, sitting eases back pain, tylenol  PRECAUTIONS: Fall risk  RED FLAGS: Bowel or bladder incontinence: Yes: Bladder, doctor Scott aware. and Cauda equina syndrome: No   WEIGHT BEARING RESTRICTIONS: No  FALLS:  Has patient fallen in last 6 months? No  LIVING ENVIRONMENT: Lives with: lives alone Lives in: House/apartment Stairs: Yes: Internal: about 12 to the basement to do laundry 2x/week steps; L rail going down to the basement and External: 2 steps; on right going up Has following equipment at home: Single point cane and Shower bench  OCCUPATION: retired  PLOF: Requires assistive device for independence  PATIENT GOALS: Be without the pain. Improve balance.   NEXT MD VISIT: Yes but pt does not know. Has a wellness visit.   OBJECTIVE:  Note: Objective measures were completed at Evaluation unless otherwise noted.  DIAGNOSTIC FINDINGS:  DG Lumbar Spine 2-3 Views 01/30/2024  Narrative & Impression  CLINICAL DATA:  Low back pain, radiating down right leg   EXAM: LUMBAR SPINE - 2-3 VIEW   COMPARISON:  2019   FINDINGS: Alignment is similar with levoscoliosis. Diffusely decreased osseous mineralization in combination with overlying bowel gas limits evaluation. Vertebral body heights appear similar. Multilevel disc space narrowing, endplate osteophytes, and facet hypertrophy.   IMPRESSION: Suboptimal evaluation due to demineralization. Multilevel spondylosis superimposed on scoliosis similar to prior study.     Electronically Signed   By: Guadlupe Spanish M.D.   On: 01/30/2021 09:09    PATIENT SURVEYS:  Modified Oswestry 9/50 (18%) (08/06/2023)   COGNITION: Overall cognitive status: Within functional limits for tasks  assessed     SENSATION:   MUSCLE LENGTH:    POSTURE: forward neck B protracted shoulders, R shoulder lower, forward flexed, R iliac crest and greater trochanter higher, L lumbar convexity around L3, L knee in slight flexion, R knee genu valgus.   PALPATION: TTP to R posterior hip/piriformis (proximal attachment and muscle belly), TTP around R posterior hip around Sciatic nerve.  LUMBAR ROM:   AROM eval  Flexion WFL with R low back pain  Extension Limited with low back pain  Right lateral flexion WFL  Left lateral flexion WFL  Right rotation [facility] with R low back pain  Left rotation WFL   (Blank rows = not tested)  LOWER EXTREMITY ROM:     Passive  Right (09/05/2023) Left (09/05/2023)  Hip flexion    Hip extension    Hip abduction    Hip adduction    Hip internal rotation WFL Limited with reproduction of R posterior hip pain  Hip external rotation    Knee flexion    Knee extension    Ankle dorsiflexion    Ankle plantarflexion    Ankle inversion    Ankle eversion     (Blank rows = not tested)  LOWER EXTREMITY MMT:    MMT Right eval Left eval  Hip flexion 4- 4-  Hip extension (seated manually resisted) 3 3+  Hip abduction (seated manually resisted) 4 4  Hip adduction    Hip internal rotation    Hip external rotation 4 4-  Knee flexion 4 4  Knee extension 4+ 5  Ankle dorsiflexion    Ankle plantarflexion    Ankle inversion    Ankle eversion     (Blank rows =  not tested)  LUMBAR SPECIAL TESTS:    FUNCTIONAL TESTS:  5 times sit to stand: unable to test at this time Timed up and go (TUG): 18.24 seconds; 17.24 s, 17.88 seconds with SPC on L; (17.8 seconds average)  Dynamic Gait Index: unable to test at this time Decreased femoral control R with STS   GAIT: Distance walked: 30 ft Assistive device utilized: Single point cane Level of assistance: Modified independence Comments: Antalic, decreased stance R LE, SPC on R, R knee genu valgus during stance phase  of gait  TREATMENT DATE: 09/12/2023                                                                                                                                Therapeutic activities  Manually resisted hip extension, abduction, ER 1x each way for each LE  Standing up from a chair, walking 10 ft forward, then returning 10 ft, then sitting back onto chair 3x  With SPC on L side 16.91 seconds, 14.44 seconds, 13.22 seconds (14.9 seconds average with SPC on L side)  Reviewed progress/current status with PT towards goals.   Stand to sit onto regular chair (B UE assist to stand, no UE assist to sit 6x, then 5x2   Standing with B UE assist   SLS   R 10x5 seconds for 2 sets   L 10x5 seconds for 2 sets   Improved technique, movement at target joints, use of target muscles after mod verbal, visual, tactile cues.     Therapeutic Exercies  Seated L hip piriformis stretch 30 seconds x 3  Seated L hip IR 10x5 seconds for 3 sets  Standign with B UE assist   Hip abduction    R 10x3   L 10x3   Improved exercise technique, movement at target joints, use of target muscles after mod verbal, visual, tactile cues.         PATIENT EDUCATION:  Education details: there-ex, HEP Person educated: Patient, Child(ren), and Estate manager/land agent Education method: Explanation, Demonstration, Tactile cues, Verbal cues, and Handouts Education comprehension: verbalized understanding and returned demonstration  HOME EXERCISE PROGRAM: Access Code:  URL: [email or web address] Date: 08/08/2023 Prepared by: Loralyn Freshwater  Exercises - Seated Flexion Stretch  - 3 x daily - 7 x weekly - 3 sets - 10 reps - 5 seconds hold - Seated Transversus Abdominis Bracing  - 3 x daily - 7 x weekly - 3 sets - 10 reps - 5 seconds hold - Seated Gluteal Sets  - 3 x daily - 7 x weekly - 3 sets - 10 reps - 5 seconds hold  - Hooklying Clamshell with Resistance  - 1 x daily - 7 x weekly - 6 sets  - 5 reps  Yellow band  Hooklying Hip extension isometrics, leg straight    R 10x5 seconds for 3 sets    L 10x5 seconds for 3 sets   - Supine Piriformis Stretch  -  3 x daily - 7 x weekly - 1 sets - 5 reps - 30 seconds hold     ASSESSMENT:  CLINICAL IMPRESSION: Pt demonstrates decreasing R low back and posterior hip pain, improving overall B hip strength, as well as functional mobility since initial evaluation. Pt still demonstrates difficulty performing tasks at home based on her Modified Oswestry Low Back Pain disability Questionnaire score. Overall, pt making progress with PT towards goals. Continued working on improving L hip IR mobility to decrease stress to her R low back and posterior hip as well as glute strengthening to promote ability to ambulate with less difficulty. Pt tolerated session well without aggravation of symptoms. Pt will benefit from continued skilled physical therapy services to decrease pain, improve strength, balance, and function.        OBJECTIVE IMPAIRMENTS: Abnormal gait, decreased balance, difficulty walking, decreased strength, improper body mechanics, postural dysfunction, and pain.   ACTIVITY LIMITATIONS: carrying, lifting, bending, standing, squatting, stairs, transfers, and locomotion level  PARTICIPATION LIMITATIONS:   PERSONAL FACTORS: Age, Fitness, Time since onset of injury/illness/exacerbation, and 3+ comorbidities: heart failure, A-fib, ascending aorta dilation, osteoarthritis, syncope  are also ; affecting patient's functional outcome.   REHAB POTENTIAL: Fair    CLINICAL DECISION MAKING: Stable/uncomplicated  EVALUATION COMPLEXITY: Low   GOALS: Goals reviewed with patient? Yes  SHORT TERM GOALS: Target date: 08/22/2023  Pt will be independent with her initial HEP to decrease pain, improve strength, balance, and function.  Baseline: Pt has not yet started her initial HEP (08/06/2023); No questions, able to perform her HEP  (09/12/2023) Goal status: MET    LONG TERM GOALS: Target date: 10/03/2023  Pt will have a decrease in low back pain to 3/10 or less at worst to promote ability to perform standing tasks such as yard work more comfortably.  Baseline: 7/10 low back pain at worst for the past 3 months (08/06/2023); 3/10 at least at most for the past 7 days (09/12/2023) Goal status: MET   2.  Pt will improve her Modified Oswestry Low Back Pain Disability Questionnaire to 10% or less as a demonstration of improved function.  Baseline: Modified Oswestry 9/50 (18%) (08/06/2023); 12/50 (24%) ( 09/12/2023) Goal status: ONGOING  3.  Pt will improve her B hip extension, ER, and abduction strength by at least 1/2 MMT grade to promote ability to perform closed chain tasks more comfortably for her back.  Baseline:  MMT Right eval Left eval R (09/12/2023) L  (09/12/2023)  Hip extension (seated manually resisted) 3 3+ 4 4  Hip abduction (seated manually resisted) 4 4 4+ 4+  Hip external rotation 4 4- 4 4   (08/06/2023)  Goal status: Partially met  4.  Pt will improver her Timed Up and Go time to 12 seconds or less with her [facility] as a demonstration of improved functional mobility  Baseline: 17.8 seconds average with SPC on L side (08/06/2023); 14.9 seconds averange with SPC on L side (09/12/2023) Goal status: Progressing.     PLAN:  PT FREQUENCY: 2x/week  PT DURATION: 8 weeks  PLANNED INTERVENTIONS: 97110-Therapeutic exercises, 97530- Therapeutic activity, [id number]- Neuromuscular re-education, 97535- Self Care, 16109- Manual therapy, [phone]- Gait training, [phone]- Canalith repositioning, [id number]- Electrical stimulation (unattended), [phone]- Ionotophoresis 4mg /ml Dexamethasone, Patient/Family education, Balance training, Stair training, Dry Needling, Joint mobilization, and Vestibular training.  PLAN FOR NEXT SESSION: Posture, trunk and hip strength, femoral control, functional strengthening, balance, manual techniques, modalities  PRN   Layanna Charo, PT, DPT 09/12/2023, 12:10 PM

## 2023-09-16 ENCOUNTER — Encounter: Payer: Self-pay | Admitting: Internal Medicine

## 2023-09-16 ENCOUNTER — Ambulatory Visit: Payer: Medicare Other

## 2023-09-16 ENCOUNTER — Ambulatory Visit (INDEPENDENT_AMBULATORY_CARE_PROVIDER_SITE_OTHER): Payer: Medicare Other | Admitting: Internal Medicine

## 2023-09-16 VITALS — BP 128/74 | HR 89 | Temp 97.9°F | Resp 16 | Ht 67.5 in | Wt 142.0 lb

## 2023-09-16 DIAGNOSIS — I4891 Unspecified atrial fibrillation: Secondary | ICD-10-CM

## 2023-09-16 DIAGNOSIS — E7800 Pure hypercholesterolemia, unspecified: Secondary | ICD-10-CM

## 2023-09-16 DIAGNOSIS — D696 Thrombocytopenia, unspecified: Secondary | ICD-10-CM | POA: Diagnosis not present

## 2023-09-16 DIAGNOSIS — L989 Disorder of the skin and subcutaneous tissue, unspecified: Secondary | ICD-10-CM | POA: Insufficient documentation

## 2023-09-16 DIAGNOSIS — I5030 Unspecified diastolic (congestive) heart failure: Secondary | ICD-10-CM | POA: Diagnosis not present

## 2023-09-16 DIAGNOSIS — M48061 Spinal stenosis, lumbar region without neurogenic claudication: Secondary | ICD-10-CM

## 2023-09-16 DIAGNOSIS — I77810 Thoracic aortic ectasia: Secondary | ICD-10-CM

## 2023-09-16 LAB — HEPATIC FUNCTION PANEL
ALT: 19 U/L (ref 0–35)
AST: 22 U/L (ref 0–37)
Albumin: 4.2 g/dL (ref 3.5–5.2)
Alkaline Phosphatase: 63 U/L (ref 39–117)
Bilirubin, Direct: 0.1 mg/dL (ref 0.0–0.3)
Total Bilirubin: 0.6 mg/dL (ref 0.2–1.2)
Total Protein: 6.7 g/dL (ref 6.0–8.3)

## 2023-09-16 LAB — LIPID PANEL
Cholesterol: 214 mg/dL — ABNORMAL HIGH (ref 0–200)
HDL: 75.3 mg/dL
LDL Cholesterol: 117 mg/dL — ABNORMAL HIGH (ref 0–99)
NonHDL: 138.4
Total CHOL/HDL Ratio: 3
Triglycerides: 106 mg/dL (ref 0.0–149.0)
VLDL: 21.2 mg/dL (ref 0.0–40.0)

## 2023-09-16 LAB — BASIC METABOLIC PANEL WITH GFR
BUN: 13 mg/dL (ref 6–23)
CO2: 32 meq/L (ref 19–32)
Calcium: 9.5 mg/dL (ref 8.4–10.5)
Chloride: 101 meq/L (ref 96–112)
Creatinine, Ser: 0.57 mg/dL (ref 0.40–1.20)
GFR: 81.58 mL/min (ref >60.00)
Glucose, Bld: 90 mg/dL (ref 70–99)
Potassium: 4.6 meq/L (ref 3.5–5.1)
Sodium: 137 meq/L (ref 135–145)

## 2023-09-16 NOTE — Assessment & Plan Note (Signed)
 Breathing stable. No increased sob.   No evidence of volume overload today on exam. On metoprolol. Off lasix.  Weight stable. Continue to monitor. Follow metabolic panel.

## 2023-09-16 NOTE — Assessment & Plan Note (Signed)
 Low cholesterol diet and exercise.  Have discussed calculated cholesterol risk and recommendation to start cholesterol medication.  She has declined. Follow lipid panel. Recheck today.

## 2023-09-16 NOTE — Assessment & Plan Note (Signed)
 Has known spinal stenosis.  Has seen Dr Erman Hayward.  S/p injections. Discussed today. No further intervention at this time.

## 2023-09-16 NOTE — Assessment & Plan Note (Signed)
Abdominal ultrasound revealed spleen size - wnl.  hgb wnl.  Follow cbc.  

## 2023-09-16 NOTE — Assessment & Plan Note (Signed)
 Continue adequate blood pressure control.

## 2023-09-16 NOTE — Assessment & Plan Note (Signed)
 Continues on metoprolol and xarelto.  Cost of xarelto is an issue. Stable. SR on exam. Discussed remaining on xarelto. Follow.

## 2023-09-16 NOTE — Progress Notes (Signed)
 Subjective:    Patient ID: Deborah Baxter, female    DOB: 30-Mar-1934, 89 y.o.   MRN: [id number]  Patient here for  Chief Complaint  Patient presents with   Medical Management of Chronic Issues    HPI Here for a scheduled follow up - follow up regarding  afib, HFpEF, hypertension and hypercholesterolemia. She is accompanied by her daughter. History obtained from both of them. Had f/u with cardiology 05/07/23 - stable. Has been seeing PT - unsteady gait and muscle weakness. Can tell some improvement. Discussed continuing exercises at home. No chest pain. Breathing stable. Chronic back issues. Taking miralax prn to help keep bowels moving. Persistent lesion -temple. Discussed referral to dermatology. Some post nasal drainage. Discussed saline nasal spray and nasacort nasal spray as directed. She was questioning stopping xarelto. Cost is an issue.    Past Medical History:  Diagnosis Date   (HFpEF) heart failure with preserved ejection fraction (HCC) 04/24/2021   Pulmonary raised concern - question of pulmonary hypertension secondary to DD.  Question of need for right heart cath   A-fib [facility]) 04/24/2021   Acute heart failure with preserved ejection fraction (HCC)    Ascending aorta dilatation (HCC) 08/01/2021   Atrial fibrillation with rapid ventricular response (HCC) 03/20/2021   Congestion of nasal sinus 10/13/2021   Cough 04/23/2021   Diverticulosis 09/05/2012   Colonoscopy 08/31/12 - redundant colon and diverticulosis in the sigmoid colon, descending colon, transverse colon and hepatic flexure.  Recommend f/u colonoscopy in 10 years.    Elevated bilirubin 09/13/2021   Elevated blood pressure reading 12/05/2020   Health care maintenance 07/13/2014   Physical 07/12/14.  Colonoscopy 08/31/12.  Normal bone density 04/06/10.  Mammogram 05/30/20- Birads I.     Hx of degenerative disc disease    Hypercholesterolemia    Hypertensive urgency 03/20/2021   Ingrown toenail 09/13/2021   Left hand pain 03/20/2021    Low back pain 01/29/2021   Lumbago 07/13/2014   Lumbar spinal stenosis 06/27/2012   Neuromuscular disorder (HCC)    pinched nerve   Numbness and tingling in left hand 08/21/2017   Osteoarthritis    Pleural effusion 09/13/2021   Pulmonary edema 03/20/2021   Seasonal allergies    Spinal stenosis    chronic back and leg pain   Stress 02/03/2021   Syncope 12/05/2020   Thrombocytopenia (HCC) 09/13/2021   Past Surgical History:  Procedure Laterality Date   APPENDECTOMY     bladder tack surgery  2000   BTL  1976   CARDIOVERSION N/A 05/11/2021   Procedure: CARDIOVERSION;  Surgeon: Antonieta Iba, MD;  Location: [facility] ORS;  Service: Cardiovascular;  Laterality: N/A;   CARPAL TUNNEL RELEASE     CATARACT EXTRACTION W/PHACO Left 12/07/2015   Procedure: CATARACT EXTRACTION PHACO AND INTRAOCULAR LENS PLACEMENT (IOC);  Surgeon: Nevada Crane, MD;  Location: [facility] ORS;  Service: Ophthalmology;  Laterality: Left;  [place] 1.23 AP% 23.2 CDE 19.28 Fluid Pack Lot # [id number]   EYE SURGERY  03/19/15   GANGLION CYST EXCISION     right hand   GAS/FLUID EXCHANGE Left 03/08/2015   Procedure: GAS/FLUID EXCHANGE;  Surgeon: Marcelene Butte, MD;  Location: [facility] ORS;  Service: Ophthalmology;  Laterality: Left;   RIGHT HEART CATH N/A 11/06/2021   Procedure: RIGHT HEART CATH;  Surgeon: Yvonne Kendall, MD;  Location: [facility] INVASIVE [facility];  Service: Cardiovascular;  Laterality: N/A;   TONSILLECTOMY     TUBAL LIGATION     VITRECTOMY 25 GAUGE WITH  SCLERAL BUCKLE Left 03/08/2015   Procedure: VITRECTOMY 25 GAUGE, membrane peel, 26 %  SF6 gas exchange;  Surgeon: Marcelene Butte, MD;  Location: [facility] ORS;  Service: Ophthalmology;  Laterality: Left;  casette lot # [id number]   Family History  Problem Relation Age of Onset   Diabetes Mother    Heart disease Father        Unknown age of onset. Unknown if had CHF or heart attack.   Leukemia Father    Breast cancer Sister        in her 14's   Cancer Sister        lung cancer    Thyroid cancer Sister    Breast cancer Maternal Aunt    Colon cancer Neg Hx    Social History   Socioeconomic History   Marital status: Widowed    Spouse name: Not on file   Number of children: 5   Years of education: Not on file   Highest education level: Some college, no degree  Occupational History   Not on file  Tobacco Use   Smoking status: Former    Current packs/day: 0.00    Average packs/day: 0.3 packs/day for 10.0 years (2.5 ttl pk-yrs)    Types: Cigarettes    Start date: 25    Quit date: 34    Years since quitting: 52.3   Smokeless tobacco: Never  Vaping Use   Vaping status: Never Used  Substance and Sexual Activity   Alcohol use: Not Currently    Comment: occassional   Drug use: No   Sexual activity: Not on file  Other Topics Concern   Not on file  Social History Narrative   Not on file   Social Drivers of Health   Financial Resource Strain: Low Risk  (09/03/2023)   Overall Financial Resource Strain (CARDIA)    Difficulty of Paying Living Expenses: Not hard at all  Food Insecurity: No Food Insecurity (09/03/2023)   Hunger Vital Sign    Worried About Running Out of Food in the Last Year: Never true    Ran Out of Food in the Last Year: Never true  Transportation Needs: No Transportation Needs (09/03/2023)   PRAPARE - Administrator, Civil Service (Medical): No    Lack of Transportation (Non-Medical): No  Physical Activity: Insufficiently Active (09/03/2023)   Exercise Vital Sign    Days of Exercise per Week: 5 days    Minutes of Exercise per Session: 20 min  Stress: No Stress Concern Present (09/03/2023)   [employer] of Occupational Health - Occupational Stress Questionnaire    Feeling of Stress : Not at all  Social Connections: Moderately Isolated (09/03/2023)   Social Connection and Isolation Panel [NHANES]    Frequency of Communication with Friends and Family: More than three times a week    Frequency of Social Gatherings with Friends  and Family: More than three times a week    Attends Religious Services: More than 4 times per year    Active Member of [employer] or Organizations: No    Attends Banker Meetings: Never    Marital Status: Widowed     Review of Systems  Constitutional:  Negative for appetite change and unexpected weight change.  HENT:  Positive for postnasal drip. Negative for congestion.   Respiratory:  Negative for cough, chest tightness and shortness of breath.   Cardiovascular:  Negative for chest pain, palpitations and leg swelling.  Gastrointestinal:  Negative for abdominal pain, diarrhea,  nausea and vomiting.  Genitourinary:  Negative for difficulty urinating and dysuria.  Musculoskeletal:  Positive for back pain. Negative for myalgias.  Skin:  Negative for color change and rash.  Neurological:  Negative for dizziness and headaches.  Psychiatric/Behavioral:  Negative for agitation and dysphoric mood.        Objective:     BP 128/74   Pulse 89   Temp 97.9 F (36.6 C)   Resp 16   Ht 5' 7.5" (1.715 m)   Wt 142 lb (64.4 kg)   LMP 06/26/1985   SpO2 98%   BMI 21.91 kg/m  Wt Readings from Last 3 Encounters:  09/16/23 142 lb (64.4 kg)  09/03/23 141 lb 10 oz (64.2 kg)  05/16/23 141 lb 3.2 oz (64 kg)    Physical Exam Vitals reviewed.  Constitutional:      General: She is not in acute distress.    Appearance: Normal appearance.  HENT:     Head: Normocephalic and atraumatic.     Right Ear: External ear normal.     Left Ear: External ear normal.     Mouth/Throat:     Pharynx: No oropharyngeal exudate or posterior oropharyngeal erythema.  Eyes:     General: No scleral icterus.       Right eye: No discharge.        Left eye: No discharge.     Conjunctiva/sclera: Conjunctivae normal.  Neck:     Thyroid: No thyromegaly.  Cardiovascular:     Rate and Rhythm: Normal rate and regular rhythm.  Pulmonary:     Effort: No respiratory distress.     Breath sounds: Normal breath  sounds. No wheezing.  Abdominal:     General: Bowel sounds are normal.     Palpations: Abdomen is soft.     Tenderness: There is no abdominal tenderness.  Musculoskeletal:        General: No swelling or tenderness.     Cervical back: Neck supple. No tenderness.  Lymphadenopathy:     Cervical: No cervical adenopathy.  Skin:    Findings: No erythema or rash.  Neurological:     Mental Status: She is alert.  Psychiatric:        Mood and Affect: Mood normal.        Behavior: Behavior normal.         Outpatient Encounter Medications as of 09/16/2023  Medication Sig   acetaminophen (TYLENOL) 650 MG CR tablet Take 650 mg by mouth every 8 (eight) hours as needed for pain. Reported on 08/02/2015   metoprolol succinate (TOPROL XL) 25 MG 24 hr tablet Take 0.5 tablets (12.5 mg total) by mouth daily.   Multiple Vitamin (MULTIVITAMIN) tablet Take 1 tablet by mouth daily.   OVER THE COUNTER MEDICATION Place 1 drop into both eyes every 8 (eight) hours as needed (dry eyes.). Lubricant eye drops   Polyethylene Glycol 3350 (MIRALAX PO) Take 17 g by mouth every 3 (three) days. Every 3 days   rivaroxaban (XARELTO) 20 MG TABS tablet TAKE 1 TABLET BY MOUTH ONCE DAILY WITH SUPPER   Facility-Administered Encounter Medications as of 09/16/2023  Medication   sodium chloride flush (NS) 0.9 % injection 3 mL     Lab Results  Component Value Date   WBC 5.6 05/16/2023   HGB 13.3 05/16/2023   HCT 40.2 05/16/2023   PLT 209.0 05/16/2023   GLUCOSE 90 09/16/2023   CHOL 214 (H) 09/16/2023   TRIG 106.0 09/16/2023   HDL 75.30 09/16/2023  LDLDIRECT 104.0 08/12/2017   LDLCALC 117 (H) 09/16/2023   ALT 19 09/16/2023   AST 22 09/16/2023   NA 137 09/16/2023   K 4.6 09/16/2023   CL 101 09/16/2023   CREATININE 0.57 09/16/2023   BUN 13 09/16/2023   CO2 32 09/16/2023   TSH 1.86 05/16/2023   INR 1.2 03/20/2021       Assessment & Plan:  Skin lesion Assessment & Plan: Persistent lesion - left temple. Refer  to dermatology for further evaluation.   Orders: -     Ambulatory referral to Dermatology  Hypercholesterolemia Assessment & Plan: Low cholesterol diet and exercise.  Have discussed calculated cholesterol risk and recommendation to start cholesterol medication.  She has declined. Follow lipid panel. Recheck today.   Orders: -     Hepatic function panel -     Basic metabolic panel with GFR -     Lipid panel  Atrial fibrillation, unspecified type [facility]) Assessment & Plan: Continues on metoprolol and xarelto.  Cost of xarelto is an issue. Stable. SR on exam. Discussed remaining on xarelto. Follow.   Orders: -     AMB Referral [facility] Care Management  Heart failure with preserved ejection fraction, unspecified HF chronicity (HCC) Assessment & Plan: Breathing stable. No increased sob.   No evidence of volume overload today on exam. On metoprolol. Off lasix.  Weight stable. Continue to monitor. Follow metabolic panel.    Ascending aorta dilatation (HCC) Assessment & Plan: Continue adequate blood pressure control.    Spinal stenosis of lumbar region, unspecified whether neurogenic claudication present Assessment & Plan: Has known spinal stenosis.  Has seen Dr Erman Hayward.  S/p injections. Discussed today. No further intervention at this time.    Thrombocytopenia (HCC) Assessment & Plan: Abdominal ultrasound revealed spleen size - wnl.  hgb wnl.  Follow cbc.       Dellar Fenton, MD

## 2023-09-16 NOTE — Patient Instructions (Signed)
Nasacort nasal spray - 2 sprays each nostril one time per day.  Do this in the evening.   

## 2023-09-16 NOTE — Assessment & Plan Note (Signed)
 Persistent lesion - left temple. Refer to dermatology for further evaluation.

## 2023-09-19 ENCOUNTER — Ambulatory Visit: Payer: Medicare Other

## 2023-09-19 DIAGNOSIS — M6281 Muscle weakness (generalized): Secondary | ICD-10-CM | POA: Diagnosis not present

## 2023-09-19 DIAGNOSIS — R262 Difficulty in walking, not elsewhere classified: Secondary | ICD-10-CM

## 2023-09-19 DIAGNOSIS — R2681 Unsteadiness on feet: Secondary | ICD-10-CM | POA: Diagnosis not present

## 2023-09-19 DIAGNOSIS — M5459 Other low back pain: Secondary | ICD-10-CM

## 2023-09-19 NOTE — Therapy (Signed)
 OUTPATIENT PHYSICAL THERAPY TREATMENT  Patient Name: Deborah Baxter MRN: [id number] DOB:12/20/1933, 89 y.o., female Today's Date: 09/19/2023  END OF SESSION:  PT End of Session - 09/19/23 0906     Visit Number 11    Number of Visits 17    Date for PT Re-Evaluation 10/03/23    Authorization Type Medicare A & B    Progress Note Due on Visit 10    PT Start Time 0906    PT Stop Time 0945    PT Time Calculation (min) 39 min    Equipment Utilized During Treatment Gait belt    Activity Tolerance Patient tolerated treatment well    Behavior During Therapy WFL for tasks assessed/performed                      Past Medical History:  Diagnosis Date   (HFpEF) heart failure with preserved ejection fraction (HCC) 04/24/2021   Pulmonary raised concern - question of pulmonary hypertension secondary to DD.  Question of need for right heart cath   A-fib [facility]) 04/24/2021   Acute heart failure with preserved ejection fraction (HCC)    Ascending aorta dilatation (HCC) 08/01/2021   Atrial fibrillation with rapid ventricular response (HCC) 03/20/2021   Congestion of nasal sinus 10/13/2021   Cough 04/23/2021   Diverticulosis 09/05/2012   Colonoscopy 08/31/12 - redundant colon and diverticulosis in the sigmoid colon, descending colon, transverse colon and hepatic flexure.  Recommend f/u colonoscopy in 10 years.    Elevated bilirubin 09/13/2021   Elevated blood pressure reading 12/05/2020   Health care maintenance 07/13/2014   Physical 07/12/14.  Colonoscopy 08/31/12.  Normal bone density 04/06/10.  Mammogram 05/30/20- Birads I.     Hx of degenerative disc disease    Hypercholesterolemia    Hypertensive urgency 03/20/2021   Ingrown toenail 09/13/2021   Left hand pain 03/20/2021   Low back pain 01/29/2021   Lumbago 07/13/2014   Lumbar spinal stenosis 06/27/2012   Neuromuscular disorder (HCC)    pinched nerve   Numbness and tingling in left hand 08/21/2017   Osteoarthritis    Pleural effusion  09/13/2021   Pulmonary edema 03/20/2021   Seasonal allergies    Spinal stenosis    chronic back and leg pain   Stress 02/03/2021   Syncope 12/05/2020   Thrombocytopenia (HCC) 09/13/2021   Past Surgical History:  Procedure Laterality Date   APPENDECTOMY     bladder tack surgery  2000   BTL  1976   CARDIOVERSION N/A 05/11/2021   Procedure: CARDIOVERSION;  Surgeon: Devorah Fonder, MD;  Location: [facility] ORS;  Service: Cardiovascular;  Laterality: N/A;   CARPAL TUNNEL RELEASE     CATARACT EXTRACTION W/PHACO Left 12/07/2015   Procedure: CATARACT EXTRACTION PHACO AND INTRAOCULAR LENS PLACEMENT (IOC);  Surgeon: Rosa College, MD;  Location: [facility] ORS;  Service: Ophthalmology;  Laterality: Left;  US  1.23 AP% 23.2 CDE 19.28 Fluid Pack Lot # [id number]   EYE SURGERY  03/19/15   GANGLION CYST EXCISION     right hand   GAS/FLUID EXCHANGE Left 03/08/2015   Procedure: GAS/FLUID EXCHANGE;  Surgeon: Stephenie Einstein, MD;  Location: [facility] ORS;  Service: Ophthalmology;  Laterality: Left;   RIGHT HEART CATH N/A 11/06/2021   Procedure: RIGHT HEART CATH;  Surgeon: Sammy Crisp, MD;  Location: [facility];  Service: Cardiovascular;  Laterality: N/A;   TONSILLECTOMY     TUBAL LIGATION     VITRECTOMY 25 GAUGE WITH SCLERAL BUCKLE Left 03/08/2015  Procedure: VITRECTOMY 25 GAUGE, membrane peel, 26 %  SF6 gas exchange;  Surgeon: Stephenie Einstein, MD;  Location: [facility] ORS;  Service: Ophthalmology;  Laterality: Left;  casette lot # [id number]   Patient Active Problem List   Diagnosis Date Noted   Skin lesion 09/16/2023   Unsteady gait 05/16/2023   Arm pain 08/18/2022   Hx of degenerative disc disease 10/23/2021   Osteoarthritis 10/23/2021   Seasonal allergies 10/23/2021   Spinal stenosis 10/23/2021   Elevated bilirubin 09/13/2021   Thrombocytopenia (HCC) 09/13/2021   Ingrown toenail 09/13/2021   Ascending aorta dilatation (HCC) 08/01/2021   A-fib (HCC) 04/24/2021   (HFpEF) heart failure with  preserved ejection fraction (HCC) 04/24/2021   Acute heart failure with preserved ejection fraction [facility])    Atrial fibrillation with rapid ventricular response (HCC) 03/20/2021   Left hand pain 03/20/2021   Stress 02/03/2021   Low back pain 01/29/2021   Syncope 12/05/2020   Elevated blood pressure reading 12/05/2020   Numbness and tingling in left hand 08/21/2017   Lumbago 07/13/2014   Health care maintenance 07/13/2014   Diverticulosis 09/05/2012   Hypercholesterolemia 06/27/2012   Lumbar spinal stenosis 06/27/2012    PCP: Dellar Fenton, MD  REFERRING PROVIDER: Dellar Fenton, MD  REFERRING DIAG: R26.81 (ICD-10-CM) - Unsteady [phone] (ICD-10-CM) - Right-sided low back pain with right-sided sciatica, unspecified chronicity  Rationale for Evaluation and Treatment: Rehabilitation  THERAPY DIAG:  Other low back pain  Difficulty in walking, not elsewhere classified  Unsteadiness on feet  ONSET DATE: 05/16/2023 (date PT referral was signed)  SUBJECTIVE:                                                                                                                                                                                           SUBJECTIVE STATEMENT: R low back and hip area not bad. Had a couple bad nights with cramps in the back of her R calf.Has had that before.  Mustard helped it go away. 1-2/10 currently.          PERTINENT HISTORY:  R sided low back pain, and unsteady gait. Chronic, which progressively worsened over the years. Has gone to a chiropractor for a while which stopped helping. Has not had back surgery. No osteoporosis to her knowledge. Balance is also not as good, such as when standing. [name] a long time ago 50 years ago which bothered her R low back. Back pain has remained the same since several years ago. Feels R LE radiating symptoms to her foot (L5 dermatome on R)   Blood pressure is controlled per pt.   No latex allergies  PAIN:   Are you having pain? Yes: NPRS scale: 4/10 Pain location: R low back/posterior hip Pain description: catching; dull pain Aggravating factors: weather, picking up sticks in the yard (daughter states pt has a very high tolerance for pain) Relieving factors: ice pack, sitting eases back pain, tylenol   PRECAUTIONS: Fall risk  RED FLAGS: Bowel or bladder incontinence: Yes: Bladder, doctor Scott aware. and Cauda equina syndrome: No   WEIGHT BEARING RESTRICTIONS: No  FALLS:  Has patient fallen in last 6 months? No  LIVING ENVIRONMENT: Lives with: lives alone Lives in: House/apartment Stairs: Yes: Internal: about 12 to the basement to do laundry 2x/week steps; L rail going down to the basement and External: 2 steps; on right going up Has following equipment at home: Single point cane and Shower bench  OCCUPATION: retired  PLOF: Requires assistive device for independence  PATIENT GOALS: Be without the pain. Improve balance.   NEXT MD VISIT: Yes but pt does not know. Has a wellness visit.   OBJECTIVE:  Note: Objective measures were completed at Evaluation unless otherwise noted.  DIAGNOSTIC FINDINGS:  DG Lumbar Spine 2-3 Views 01/30/2024  Narrative & Impression  CLINICAL DATA:  Low back pain, radiating down right leg   EXAM: LUMBAR SPINE - 2-3 VIEW   COMPARISON:  2019   FINDINGS: Alignment is similar with levoscoliosis. Diffusely decreased osseous mineralization in combination with overlying bowel gas limits evaluation. Vertebral body heights appear similar. Multilevel disc space narrowing, endplate osteophytes, and facet hypertrophy.   IMPRESSION: Suboptimal evaluation due to demineralization. Multilevel spondylosis superimposed on scoliosis similar to prior study.     Electronically Signed   By: Geannie Keener M.D.   On: 01/30/2021 09:09    PATIENT SURVEYS:  Modified Oswestry 9/50 (18%) (08/06/2023)   COGNITION: Overall cognitive status: Within functional limits  for tasks assessed     SENSATION:   MUSCLE LENGTH:    POSTURE: forward neck B protracted shoulders, R shoulder lower, forward flexed, R iliac crest and greater trochanter higher, L lumbar convexity around L3, L knee in slight flexion, R knee genu valgus.   PALPATION: TTP to R posterior hip/piriformis (proximal attachment and muscle belly), TTP around R posterior hip around Sciatic nerve.  LUMBAR ROM:   AROM eval  Flexion WFL with R low back pain  Extension Limited with low back pain  Right lateral flexion WFL  Left lateral flexion WFL  Right rotation [facility] with R low back pain  Left rotation WFL   (Blank rows = not tested)  LOWER EXTREMITY ROM:     Passive  Right (09/05/2023) Left (09/05/2023)  Hip flexion    Hip extension    Hip abduction    Hip adduction    Hip internal rotation WFL Limited with reproduction of R posterior hip pain  Hip external rotation    Knee flexion    Knee extension    Ankle dorsiflexion    Ankle plantarflexion    Ankle inversion    Ankle eversion     (Blank rows = not tested)  LOWER EXTREMITY MMT:    MMT Right eval Left eval  Hip flexion 4- 4-  Hip extension (seated manually resisted) 3 3+  Hip abduction (seated manually resisted) 4 4  Hip adduction    Hip internal rotation    Hip external rotation 4 4-  Knee flexion 4 4  Knee extension 4+ 5  Ankle dorsiflexion    Ankle plantarflexion    Ankle inversion    Ankle  eversion     (Blank rows = not tested)  LUMBAR SPECIAL TESTS:    FUNCTIONAL TESTS:  5 times sit to stand: unable to test at this time Timed up and go (TUG): 18.24 seconds; 17.24 s, 17.88 seconds with SPC on L; (17.8 seconds average)  Dynamic Gait Index: unable to test at this time Decreased femoral control R with STS   GAIT: Distance walked: 30 ft Assistive device utilized: Single point cane Level of assistance: Modified independence Comments: Antalic, decreased stance R LE, SPC on R, R knee genu valgus during  stance phase of gait  TREATMENT DATE: 09/19/2023                                                                                                                               Therapeutic Exercies  Seated L hip piriformis stretch 30 seconds x 3  Seated L hip IR 10x5 seconds for 3 sets  Seated R hip ER 10x5 seconds for 3 sets  Seated hip adduction isometrics 10x2 with 5 second holds   Standign with B UE assist   Hip abduction    R 10x with 5 second hold, then 10x2   L 10x with 5 second holds,then 10x2   Standing static mini forward lunge onto Air Ex pad with contralateral UE assist   10x3 each LE  Standing static mini lateral lunge onto Air Ex Pad with B UE assist.   10x3 each LE    Improved exercise technique, movement at target joints, use of target muscles after mod verbal, visual, tactile cues.           PATIENT EDUCATION:  Education details: there-ex, HEP Person educated: Patient, Child(ren), and Estate manager/land agent Education method: Explanation, Demonstration, Tactile cues, Verbal cues, and Handouts Education comprehension: verbalized understanding and returned demonstration  HOME EXERCISE PROGRAM: Access Code:  URL: [email or web address] Date: 08/08/2023 Prepared by: Suzzane Estes  Exercises - Seated Flexion Stretch  - 3 x daily - 7 x weekly - 3 sets - 10 reps - 5 seconds hold - Seated Transversus Abdominis Bracing  - 3 x daily - 7 x weekly - 3 sets - 10 reps - 5 seconds hold - Seated Gluteal Sets  - 3 x daily - 7 x weekly - 3 sets - 10 reps - 5 seconds hold  - Hooklying Clamshell with Resistance  - 1 x daily - 7 x weekly - 6 sets - 5 reps  Yellow band  Hooklying Hip extension isometrics, leg straight    R 10x5 seconds for 3 sets    L 10x5 seconds for 3 sets   - Supine Piriformis Stretch  - 3 x daily - 7 x weekly - 1 sets - 5 reps - 30 seconds hold     ASSESSMENT:  CLINICAL IMPRESSION: Pt able to maintain progress of  1-2/10 initial R hip and low back pain (used to start with 3/10) from previous session. Continued working on  improving L hip IR ROM to decrease R hip compensation, as well as improving B glute med, max and overall functional LE strengthening to help decrease stress to low back and improve balance and function.  Pt tolerated session well without aggravation of symptoms. Pt will benefit from continued skilled physical therapy services to decrease pain, improve strength, balance, and function.        OBJECTIVE IMPAIRMENTS: Abnormal gait, decreased balance, difficulty walking, decreased strength, improper body mechanics, postural dysfunction, and pain.   ACTIVITY LIMITATIONS: carrying, lifting, bending, standing, squatting, stairs, transfers, and locomotion level  PARTICIPATION LIMITATIONS:   PERSONAL FACTORS: Age, Fitness, Time since onset of injury/illness/exacerbation, and 3+ comorbidities: heart failure, A-fib, ascending aorta dilation, osteoarthritis, syncope  are also ; affecting patient's functional outcome.   REHAB POTENTIAL: Fair    CLINICAL DECISION MAKING: Stable/uncomplicated  EVALUATION COMPLEXITY: Low   GOALS: Goals reviewed with patient? Yes  SHORT TERM GOALS: Target date: 08/22/2023  Pt will be independent with her initial HEP to decrease pain, improve strength, balance, and function.  Baseline: Pt has not yet started her initial HEP (08/06/2023); No questions, able to perform her HEP (09/12/2023) Goal status: MET    LONG TERM GOALS: Target date: 10/03/2023  Pt will have a decrease in low back pain to 3/10 or less at worst to promote ability to perform standing tasks such as yard work more comfortably.  Baseline: 7/10 low back pain at worst for the past 3 months (08/06/2023); 3/10 at least at most for the past 7 days (09/12/2023) Goal status: MET   2.  Pt will improve her Modified Oswestry Low Back Pain Disability Questionnaire to 10% or less as a demonstration of improved  function.  Baseline: Modified Oswestry 9/50 (18%) (08/06/2023); 12/50 (24%) ( 09/12/2023) Goal status: ONGOING  3.  Pt will improve her B hip extension, ER, and abduction strength by at least 1/2 MMT grade to promote ability to perform closed chain tasks more comfortably for her back.  Baseline:  MMT Right eval Left eval R (09/12/2023) L  (09/12/2023)  Hip extension (seated manually resisted) 3 3+ 4 4  Hip abduction (seated manually resisted) 4 4 4+ 4+  Hip external rotation 4 4- 4 4   (08/06/2023)  Goal status: Partially met  4.  Pt will improver her Timed Up and Go time to 12 seconds or less with her [facility] as a demonstration of improved functional mobility  Baseline: 17.8 seconds average with SPC on L side (08/06/2023); 14.9 seconds averange with SPC on L side (09/12/2023) Goal status: Progressing.     PLAN:  PT FREQUENCY: 2x/week  PT DURATION: 8 weeks  PLANNED INTERVENTIONS: 97110-Therapeutic exercises, 97530- Therapeutic activity, [id number]- Neuromuscular re-education, 97535- Self Care, 88416- Manual therapy, [phone]- Gait training, [phone]- Canalith repositioning, [id number]- Electrical stimulation (unattended), [phone]- Ionotophoresis 4mg /ml Dexamethasone , Patient/Family education, Balance training, Stair training, Dry Needling, Joint mobilization, and Vestibular training.  PLAN FOR NEXT SESSION: Posture, trunk and hip strength, femoral control, functional strengthening, balance, manual techniques, modalities PRN   Josephine Rudnick, PT, DPT 09/19/2023, 9:58 AM

## 2023-09-22 ENCOUNTER — Telehealth: Payer: Self-pay | Admitting: *Deleted

## 2023-09-22 NOTE — Progress Notes (Signed)
 Care Guide Pharmacy Note  09/22/2023 Name: [name] MRN: [id number] DOB: [birth year]  Referred By: Dellar Fenton, MD Reason for referral: Complex Care Management (Outreach to schedule referral with pharmacist )   Deborah Baxter is a 89 y.o. year old female who is a primary care patient of Dellar Fenton, MD.  Coye Diver was referred to the pharmacist for assistance related to:  Xarelto    Successful contact was made with the patient to discuss pharmacy services including being ready for the pharmacist to call at least 5 minutes before the scheduled appointment time and to have medication bottles and any blood pressure readings ready for review. The patient agreed to meet with the pharmacist via telephone visit on 09/23/2023  Kandis Ormond, CMA [place]  [facility], [employer] Guide Direct Dial: [phone]  Fax: [phone] Website: [email or web address]

## 2023-09-23 ENCOUNTER — Other Ambulatory Visit (INDEPENDENT_AMBULATORY_CARE_PROVIDER_SITE_OTHER): Admitting: Pharmacist

## 2023-09-23 ENCOUNTER — Ambulatory Visit: Payer: Medicare Other

## 2023-09-23 DIAGNOSIS — M6281 Muscle weakness (generalized): Secondary | ICD-10-CM

## 2023-09-23 DIAGNOSIS — M5459 Other low back pain: Secondary | ICD-10-CM | POA: Diagnosis not present

## 2023-09-23 DIAGNOSIS — R262 Difficulty in walking, not elsewhere classified: Secondary | ICD-10-CM

## 2023-09-23 DIAGNOSIS — I4891 Unspecified atrial fibrillation: Secondary | ICD-10-CM

## 2023-09-23 DIAGNOSIS — R2681 Unsteadiness on feet: Secondary | ICD-10-CM | POA: Diagnosis not present

## 2023-09-23 NOTE — Therapy (Signed)
 OUTPATIENT PHYSICAL THERAPY TREATMENT  Patient Name: Deborah Baxter MRN: [id number] DOB:Jun 13, 1933, 89 y.o., female Today's Date: 09/23/2023  END OF SESSION:  PT End of Session - 09/23/23 1035     Visit Number 12    Number of Visits 17    Date for PT Re-Evaluation 10/03/23    Authorization Type Medicare A & B    Progress Note Due on Visit 10    PT Start Time 1035    PT Stop Time 1116    PT Time Calculation (min) 41 min    Equipment Utilized During Treatment Gait belt    Activity Tolerance Patient tolerated treatment well    Behavior During Therapy WFL for tasks assessed/performed                       Past Medical History:  Diagnosis Date   (HFpEF) heart failure with preserved ejection fraction (HCC) 04/24/2021   Pulmonary raised concern - question of pulmonary hypertension secondary to DD.  Question of need for right heart cath   [facility]) 04/24/2021   Acute heart failure with preserved ejection fraction (HCC)    Ascending aorta dilatation (HCC) 08/01/2021   Atrial fibrillation with rapid ventricular response (HCC) 03/20/2021   Congestion of nasal sinus 10/13/2021   Cough 04/23/2021   Diverticulosis 09/05/2012   Colonoscopy 08/31/12 - redundant colon and diverticulosis in the sigmoid colon, descending colon, transverse colon and hepatic flexure.  Recommend f/u colonoscopy in 10 years.    Elevated bilirubin 09/13/2021   Elevated blood pressure reading 12/05/2020   Health care maintenance 07/13/2014   Physical 07/12/14.  Colonoscopy 08/31/12.  Normal bone density 04/06/10.  Mammogram 05/30/20- Birads I.     Hx of degenerative disc disease    Hypercholesterolemia    Hypertensive urgency 03/20/2021   Ingrown toenail 09/13/2021   Left hand pain 03/20/2021   Low back pain 01/29/2021   Lumbago 07/13/2014   Lumbar spinal stenosis 06/27/2012   Neuromuscular disorder (HCC)    pinched nerve   Numbness and tingling in left hand 08/21/2017   Osteoarthritis    Pleural effusion  09/13/2021   Pulmonary edema 03/20/2021   Seasonal allergies    Spinal stenosis    chronic back and leg pain   Stress 02/03/2021   Syncope 12/05/2020   Thrombocytopenia (HCC) 09/13/2021   Past Surgical History:  Procedure Laterality Date   APPENDECTOMY     bladder tack surgery  2000   BTL  1976   CARDIOVERSION N/A 05/11/2021   Procedure: CARDIOVERSION;  Surgeon: Devorah Fonder, MD;  Location: [facility] ORS;  Service: Cardiovascular;  Laterality: N/A;   CARPAL TUNNEL RELEASE     CATARACT EXTRACTION W/PHACO Left 12/07/2015   Procedure: CATARACT EXTRACTION PHACO AND INTRAOCULAR LENS PLACEMENT (IOC);  Surgeon: Rosa College, MD;  Location: [facility] ORS;  Service: Ophthalmology;  Laterality: Left;  US  1.23 AP% 23.2 CDE 19.28 Fluid Pack Lot # [id number]   EYE SURGERY  03/19/15   GANGLION CYST EXCISION     right hand   GAS/FLUID EXCHANGE Left 03/08/2015   Procedure: GAS/FLUID EXCHANGE;  Surgeon: Stephenie Einstein, MD;  Location: [facility] ORS;  Service: Ophthalmology;  Laterality: Left;   RIGHT HEART CATH N/A 11/06/2021   Procedure: RIGHT HEART CATH;  Surgeon: Sammy Crisp, MD;  Location: [facility] IN[facility]IVE [facility];  Service: Cardiovascular;  Laterality: N/A;   TONSILLECTOMY     TUBAL LIGATION     VITRECTOMY 25 GAUGE WITH SCLERAL BUCKLE Left  03/08/2015   Procedure: VITRECTOMY 25 GAUGE, membrane peel, 26 %  SF6 gas exchange;  Surgeon: Stephenie Einstein, MD;  Location: [facility] ORS;  Service: Ophthalmology;  Laterality: Left;  casette lot # [id number]   Patient Active Problem List   Diagnosis Date Noted   Skin lesion 09/16/2023   Unsteady gait 05/16/2023   Arm pain 08/18/2022   Hx of degenerative disc disease 10/23/2021   Osteoarthritis 10/23/2021   Seasonal allergies 10/23/2021   Spinal stenosis 10/23/2021   Elevated bilirubin 09/13/2021   Thrombocytopenia (HCC) 09/13/2021   Ingrown toenail 09/13/2021   Ascending aorta dilatation (HCC) 08/01/2021   A-fib (HCC) 04/24/2021   (HFpEF) heart failure with  preserved ejection fraction (HCC) 04/24/2021   Acute heart failure with preserved ejection fraction [facility])    Atrial fibrillation with rapid ventricular response (HCC) 03/20/2021   Left hand pain 03/20/2021   Stress 02/03/2021   Low back pain 01/29/2021   Syncope 12/05/2020   Elevated blood pressure reading 12/05/2020   Numbness and tingling in left hand 08/21/2017   Lumbago 07/13/2014   Health care maintenance 07/13/2014   Diverticulosis 09/05/2012   Hypercholesterolemia 06/27/2012   Lumbar spinal stenosis 06/27/2012    PCP: Dellar Fenton, MD  REFERRING PROVIDER: Dellar Fenton, MD  REFERRING DIAG: R26.81 (ICD-10-CM) - Unsteady [phone] (ICD-10-CM) - Right-sided low back pain with right-sided sciatica, unspecified chronicity  Rationale for Evaluation and Treatment: Rehabilitation  THERAPY DIAG:  Other low back pain  Difficulty in walking, not elsewhere classified  Unsteadiness on feet  Muscle weakness (generalized)  ONSET DATE: 05/16/2023 (date PT referral was signed)  SUBJECTIVE:                                                                                                                                                                                           SUBJECTIVE STATEMENT: R low back and hip area is about a 1/10 currently. Balance is better.    PERTINENT HISTORY:  R sided low back pain, and unsteady gait. Chronic, which progressively worsened over the years. Has gone to a chiropractor for a while which stopped helping. Has not had back surgery. No osteoporosis to her knowledge. Balance is also not as good, such as when standing. [name] a long time ago 50 years ago which bothered her R low back. Back pain has remained the same since several years ago. Feels R LE radiating symptoms to her foot (L5 dermatome on R)   Blood pressure is controlled per pt.   No latex allergies     PAIN:  Are you having pain? Yes: NPRS scale: 4/10 Pain location: R low  back/posterior hip  Pain description: catching; dull pain Aggravating factors: weather, picking up sticks in the yard (daughter states pt has a very high tolerance for pain) Relieving factors: ice pack, sitting eases back pain, tylenol   PRECAUTIONS: Fall risk  RED FLAGS: Bowel or bladder incontinence: Yes: Bladder, doctor Scott aware. and Cauda equina syndrome: No   WEIGHT BEARING RESTRICTIONS: No  FALLS:  Has patient fallen in last 6 months? No  LIVING ENVIRONMENT: Lives with: lives alone Lives in: House/apartment Stairs: Yes: Internal: about 12 to the basement to do laundry 2x/week steps; L rail going down to the basement and External: 2 steps; on right going up Has following equipment at home: Single point cane and Shower bench  OCCUPATION: retired  PLOF: Requires assistive device for independence  PATIENT GOALS: Be without the pain. Improve balance.   NEXT MD VISIT: Yes but pt does not know. Has a wellness visit.   OBJECTIVE:  Note: Objective measures were completed at Evaluation unless otherwise noted.  DIAGNOSTIC FINDINGS:  DG Lumbar Spine 2-3 Views 01/30/2024  Narrative & Impression  CLINICAL DATA:  Low back pain, radiating down right leg   EXAM: LUMBAR SPINE - 2-3 VIEW   COMPARISON:  2019   FINDINGS: Alignment is similar with levoscoliosis. Diffusely decreased osseous mineralization in combination with overlying bowel gas limits evaluation. Vertebral body heights appear similar. Multilevel disc space narrowing, endplate osteophytes, and facet hypertrophy.   IMPRESSION: Suboptimal evaluation due to demineralization. Multilevel spondylosis superimposed on scoliosis similar to prior study.     Electronically Signed   By: Geannie Keener M.D.   On: 01/30/2021 09:09    PATIENT SURVEYS:  Modified Oswestry 9/50 (18%) (08/06/2023)   COGNITION: Overall cognitive status: Within functional limits for tasks assessed     SENSATION:   MUSCLE  LENGTH:    POSTURE: forward neck B protracted shoulders, R shoulder lower, forward flexed, R iliac crest and greater trochanter higher, L lumbar convexity around L3, L knee in slight flexion, R knee genu valgus.   PALPATION: TTP to R posterior hip/piriformis (proximal attachment and muscle belly), TTP around R posterior hip around Sciatic nerve.  LUMBAR ROM:   AROM eval  Flexion WFL with R low back pain  Extension Limited with low back pain  Right lateral flexion WFL  Left lateral flexion WFL  Right rotation [facility] with R low back pain  Left rotation WFL   (Blank rows = not tested)  LOWER EXTREMITY ROM:     Passive  Right (09/05/2023) Left (09/05/2023)  Hip flexion    Hip extension    Hip abduction    Hip adduction    Hip internal rotation WFL Limited with reproduction of R posterior hip pain  Hip external rotation    Knee flexion    Knee extension    Ankle dorsiflexion    Ankle plantarflexion    Ankle inversion    Ankle eversion     (Blank rows = not tested)  LOWER EXTREMITY MMT:    MMT Right eval Left eval  Hip flexion 4- 4-  Hip extension (seated manually resisted) 3 3+  Hip abduction (seated manually resisted) 4 4  Hip adduction    Hip internal rotation    Hip external rotation 4 4-  Knee flexion 4 4  Knee extension 4+ 5  Ankle dorsiflexion    Ankle plantarflexion    Ankle inversion    Ankle eversion     (Blank rows = not tested)  LUMBAR SPECIAL TESTS:  FUNCTIONAL TESTS:  5 times sit to stand: unable to test at this time Timed up and go (TUG): 18.24 seconds; 17.24 s, 17.88 seconds with SPC on L; (17.8 seconds average)  Dynamic Gait Index: unable to test at this time Decreased femoral control R with STS   GAIT: Distance walked: 30 ft Assistive device utilized: Single point cane Level of assistance: Modified independence Comments: Antalic, decreased stance R LE, SPC on R, R knee genu valgus during stance phase of gait  TREATMENT DATE: 09/23/2023                                                                                                                                Therapeutic Exercies  Sit <> stand from regular chair with arms, with B UE assist with emphasis on femoral control 2x  Then with yellow band resisting hip abduction/ER 10x with B UE assist  10x2.   Standign with B UE assist   Hip abduction with yellow band around knees   R 10x3   L 10x3  Standing static mini forward lunge onto Air Ex pad with B UE assist   With yellow band around knees  10x3 each LE  Standing static mini lateral lunge onto Air Ex Pad with B UE assist.   With yellow band around knees  10x3 each LE  Standing with B UE assist   B heel toe raises 10x3 each direction  Seated R hip ER 10x5 seconds for 3 sets   Improved exercise technique, movement at target joints, use of target muscles after mod verbal, visual, tactile cues.    Neuromuscular re-eduction Stepping over 2 mini hurdles with one UE assist 10x2, CGA  Gait without use of AD 100 ft cues to place and maintain her center of gravity over her base of support (foot). Able to perform, no LOB. CGA   Improved technique, movement at target joints, use of target muscles after mod verbal, visual, tactile cues.           PATIENT EDUCATION:  Education details: there-ex, HEP Person educated: Patient, Child(ren), and Estate manager/land agent Education method: Explanation, Demonstration, Tactile cues, Verbal cues, and Handouts Education comprehension: verbalized understanding and returned demonstration  HOME EXERCISE PROGRAM: Access Code:  URL: [email or web address] Date: 08/08/2023 Prepared by: Suzzane Estes  Exercises - Seated Flexion Stretch  - 3 x daily - 7 x weekly - 3 sets - 10 reps - 5 seconds hold - Seated Transversus Abdominis Bracing  - 3 x daily - 7 x weekly - 3 sets - 10 reps - 5 seconds hold - Seated Gluteal Sets  - 3 x daily - 7 x weekly - 3 sets -  10 reps - 5 seconds hold  - Hooklying Clamshell with Resistance  - 1 x daily - 7 x weekly - 6 sets - 5 reps  Yellow band  Hooklying Hip extension isometrics, leg straight    R 10x5 seconds for 3  sets    L 10x5 seconds for 3 sets   - Supine Piriformis Stretch  - 3 x daily - 7 x weekly - 1 sets - 5 reps - 30 seconds hold     ASSESSMENT:  CLINICAL IMPRESSION: Decreasing low back/R posterior hip pain and improving balance based on subjective report and clinical observation. Continued working on improving B femoral control as well as glute strength to promote better LE mechanics and decrease strain on R hip and low back. Also worked on obstacle negotiation to help decrease fall risk. Pt tolerated session well without aggravation of symptoms. Pt will benefit from continued skilled physical therapy services to decrease pain, improve strength, balance, and function.        OBJECTIVE IMPAIRMENTS: Abnormal gait, decreased balance, difficulty walking, decreased strength, improper body mechanics, postural dysfunction, and pain.   ACTIVITY LIMITATIONS: carrying, lifting, bending, standing, squatting, stairs, transfers, and locomotion level  PARTICIPATION LIMITATIONS:   PERSONAL FACTORS: Age, Fitness, Time since onset of injury/illness/exacerbation, and 3+ comorbidities: heart failure, A-fib, ascending aorta dilation, osteoarthritis, syncope  are also ; affecting patient's functional outcome.   REHAB POTENTIAL: Fair    CLINICAL DECISION MAKING: Stable/uncomplicated  EVALUATION COMPLEXITY: Low   GOALS: Goals reviewed with patient? Yes  SHORT TERM GOALS: Target date: 08/22/2023  Pt will be independent with her initial HEP to decrease pain, improve strength, balance, and function.  Baseline: Pt has not yet started her initial HEP (08/06/2023); No questions, able to perform her HEP (09/12/2023) Goal status: MET    LONG TERM GOALS: Target date: 10/03/2023  Pt will have a decrease in low  back pain to 3/10 or less at worst to promote ability to perform standing tasks such as yard work more comfortably.  Baseline: 7/10 low back pain at worst for the past 3 months (08/06/2023); 3/10 at least at most for the past 7 days (09/12/2023) Goal status: MET   2.  Pt will improve her Modified Oswestry Low Back Pain Disability Questionnaire to 10% or less as a demonstration of improved function.  Baseline: Modified Oswestry 9/50 (18%) (08/06/2023); 12/50 (24%) ( 09/12/2023) Goal status: ONGOING  3.  Pt will improve her B hip extension, ER, and abduction strength by at least 1/2 MMT grade to promote ability to perform closed chain tasks more comfortably for her back.  Baseline:  MMT Right eval Left eval R (09/12/2023) L  (09/12/2023)  Hip extension (seated manually resisted) 3 3+ 4 4  Hip abduction (seated manually resisted) 4 4 4+ 4+  Hip external rotation 4 4- 4 4   (08/06/2023)  Goal status: Partially met  4.  Pt will improver her Timed Up and Go time to 12 seconds or less with her [facility] as a demonstration of improved functional mobility  Baseline: 17.8 seconds average with SPC on L side (08/06/2023); 14.9 seconds averange with SPC on L side (09/12/2023) Goal status: Progressing.     PLAN:  PT FREQUENCY: 2x/week  PT DURATION: 8 weeks  PLANNED INTERVENTIONS: 97110-Therapeutic exercises, 97530- Therapeutic activity, [id number]- Neuromuscular re-education, 97535- Self Care, 08657- Manual therapy, [phone]- Gait training, [phone]- Canalith repositioning, U1324- Electrical stimulation (unattended), [phone]- Ionotophoresis 4mg /ml Dexamethasone , Patient/Family education, Balance training, Stair training, Dry Needling, Joint mobilization, and Vestibular training.  PLAN FOR NEXT SESSION: Posture, trunk and hip strength, femoral control, functional strengthening, balance, manual techniques, modalities PRN   Alizzon Dioguardi, PT, DPT 09/23/2023, 11:31 AM

## 2023-09-23 NOTE — Progress Notes (Signed)
   09/23/2023 Name: Deborah Baxter MRN: [id number] DOB: 12-02-1933  Subjective  Chief Complaint  Patient presents with   Medication Access    Care Team: Primary Care Provider: Dellar Fenton, MD  Reason for visit: ?  Deborah Baxter is a 89 y.o. female who presents today for a telephone visit with the pharmacist due to medication access concerns regarding their Xarelto . ?   Medication Access: ?  Reports that all medications are not affordable.   Prescription drug coverage: Yes though not listed in Media tab.  Medicare A/B benefit + Medicare supplement (medical benefit)  Current Patient Assistance: None  Patient lives in a household of 1 with an estimated combined annual income of only social security retirement .   Medicare LIS Eligible: Previously applied/denied  Assessment and Plan:   1. Medication Access Anticipated no prescription insurance per Media tab - patient also denied rx coverage (would be eligible for Xarelto  PAP without OOP spending requirement) though upon test claim, prescription benefit is detected.  Has Rx coverage, just has not been scanned into chart. Would need to reach 3% of income in spending at pharmacy for Xarelto  PAP approval.  Test claim = $47/month for Eliquis which is the same as Xarelto .  Alternatives are limited at this time. Brand medications through her current plan are $47/month which patient feels is not affordable.   Generic alternatives include warfarin and dabigatran which have their own challenges.     Future Appointments  Date Time Provider Department Center  09/26/2023  9:45 AM Dellis Fermo, PT [facility] None  09/30/2023 10:30 AM Dellis Fermo, PT [facility] None  11/04/2023  9:15 AM Velma Ghazi, DPM [facility]  01/21/2024 10:00 AM Dellar Fenton, MD [facility]  09/08/2024 10:10 AM [facility] ANNUAL WELLNESS VISIT [facility]    Daron Ellen, PharmD Clinical Pharmacist [facility] [phone]

## 2023-09-26 ENCOUNTER — Ambulatory Visit: Payer: Medicare Other

## 2023-09-26 DIAGNOSIS — R2681 Unsteadiness on feet: Secondary | ICD-10-CM | POA: Diagnosis not present

## 2023-09-26 DIAGNOSIS — R262 Difficulty in walking, not elsewhere classified: Secondary | ICD-10-CM | POA: Diagnosis not present

## 2023-09-26 DIAGNOSIS — M6281 Muscle weakness (generalized): Secondary | ICD-10-CM | POA: Diagnosis not present

## 2023-09-26 DIAGNOSIS — M5459 Other low back pain: Secondary | ICD-10-CM

## 2023-09-26 NOTE — Therapy (Signed)
 OUTPATIENT PHYSICAL THERAPY TREATMENT  Patient Name: Deborah Baxter MRN: [id number] DOB:1934/01/27, 89 y.o., female Today's Date: 09/26/2023  END OF SESSION:  PT End of Session - 09/26/23 0949     Visit Number 13    Number of Visits 17    Date for PT Re-Evaluation 10/03/23    Authorization Type Medicare A & B    Progress Note Due on Visit 10    PT Start Time [phone]    PT Stop Time 1028    PT Time Calculation (min) 39 min    Equipment Utilized During Treatment Gait belt    Activity Tolerance Patient tolerated treatment well    Behavior During Therapy WFL for tasks assessed/performed                        Past Medical History:  Diagnosis Date   (HFpEF) heart failure with preserved ejection fraction (HCC) 04/24/2021   Pulmonary raised concern - question of pulmonary hypertension secondary to DD.  Question of need for right heart cath   A-fib [facility]) 04/24/2021   Acute heart failure with preserved ejection fraction (HCC)    Ascending aorta dilatation (HCC) 08/01/2021   Atrial fibrillation with rapid ventricular response (HCC) 03/20/2021   Congestion of nasal sinus 10/13/2021   Cough 04/23/2021   Diverticulosis 09/05/2012   Colonoscopy 08/31/12 - redundant colon and diverticulosis in the sigmoid colon, descending colon, transverse colon and hepatic flexure.  Recommend f/u colonoscopy in 10 years.    Elevated bilirubin 09/13/2021   Elevated blood pressure reading 12/05/2020   Health care maintenance 07/13/2014   Physical 07/12/14.  Colonoscopy 08/31/12.  Normal bone density 04/06/10.  Mammogram 05/30/20- Birads I.     Hx of degenerative disc disease    Hypercholesterolemia    Hypertensive urgency 03/20/2021   Ingrown toenail 09/13/2021   Left hand pain 03/20/2021   Low back pain 01/29/2021   Lumbago 07/13/2014   Lumbar spinal stenosis 06/27/2012   Neuromuscular disorder (HCC)    pinched nerve   Numbness and tingling in left hand 08/21/2017   Osteoarthritis    Pleural effusion  09/13/2021   Pulmonary edema 03/20/2021   Seasonal allergies    Spinal stenosis    chronic back and leg pain   Stress 02/03/2021   Syncope 12/05/2020   Thrombocytopenia (HCC) 09/13/2021   Past Surgical History:  Procedure Laterality Date   APPENDECTOMY     bladder tack surgery  2000   BTL  1976   CARDIOVERSION N/A 05/11/2021   Procedure: CARDIOVERSION;  Surgeon: Devorah Fonder, MD;  Location: [facility] ORS;  Service: Cardiovascular;  Laterality: N/A;   CARPAL TUNNEL RELEASE     CATARACT EXTRACTION W/PHACO Left 12/07/2015   Procedure: CATARACT EXTRACTION PHACO AND INTRAOCULAR LENS PLACEMENT (IOC);  Surgeon: Rosa College, MD;  Location: [facility] ORS;  Service: Ophthalmology;  Laterality: Left;  US  1.23 AP% 23.2 CDE 19.28 Fluid Pack Lot # [id number]   EYE SURGERY  03/19/15   GANGLION CYST EXCISION     right hand   GAS/FLUID EXCHANGE Left 03/08/2015   Procedure: GAS/FLUID EXCHANGE;  Surgeon: Stephenie Einstein, MD;  Location: [facility] ORS;  Service: Ophthalmology;  Laterality: Left;   RIGHT HEART CATH N/A 11/06/2021   Procedure: RIGHT HEART CATH;  Surgeon: Sammy Crisp, MD;  Location: [facility] INVASIVE [facility];  Service: Cardiovascular;  Laterality: N/A;   TONSILLECTOMY     TUBAL LIGATION     VITRECTOMY 25 GAUGE WITH SCLERAL BUCKLE  Left 03/08/2015   Procedure: VITRECTOMY 25 GAUGE, membrane peel, 26 %  SF6 gas exchange;  Surgeon: Stephenie Einstein, MD;  Location: [facility] ORS;  Service: Ophthalmology;  Laterality: Left;  casette lot # [id number]   Patient Active Problem List   Diagnosis Date Noted   Skin lesion 09/16/2023   Unsteady gait 05/16/2023   Arm pain 08/18/2022   Hx of degenerative disc disease 10/23/2021   Osteoarthritis 10/23/2021   Seasonal allergies 10/23/2021   Spinal stenosis 10/23/2021   Elevated bilirubin 09/13/2021   Thrombocytopenia (HCC) 09/13/2021   Ingrown toenail 09/13/2021   Ascending aorta dilatation (HCC) 08/01/2021   A-fib (HCC) 04/24/2021   (HFpEF) heart failure with  preserved ejection fraction (HCC) 04/24/2021   Acute heart failure with preserved ejection fraction [facility])    Atrial fibrillation with rapid ventricular response (HCC) 03/20/2021   Left hand pain 03/20/2021   Stress 02/03/2021   Low back pain 01/29/2021   Syncope 12/05/2020   Elevated blood pressure reading 12/05/2020   Numbness and tingling in left hand 08/21/2017   Lumbago 07/13/2014   Health care maintenance 07/13/2014   Diverticulosis 09/05/2012   Hypercholesterolemia 06/27/2012   Lumbar spinal stenosis 06/27/2012    PCP: Dellar Fenton, MD  REFERRING PROVIDER: Dellar Fenton, MD  REFERRING DIAG: R26.81 (ICD-10-CM) - Unsteady [phone] (ICD-10-CM) - Right-sided low back pain with right-sided sciatica, unspecified chronicity  Rationale for Evaluation and Treatment: Rehabilitation  THERAPY DIAG:  Other low back pain  Difficulty in walking, not elsewhere classified  Unsteadiness on feet  Muscle weakness (generalized)  ONSET DATE: 05/16/2023 (date PT referral was signed)  SUBJECTIVE:                                                                                                                                                                                           SUBJECTIVE STATEMENT: R low back and hip area is about a 1-2/10 currently. Balance is still doing ok.    PERTINENT HISTORY:  R sided low back pain, and unsteady gait. Chronic, which progressively worsened over the years. Has gone to a chiropractor for a while which stopped helping. Has not had back surgery. No osteoporosis to her knowledge. Balance is also not as good, such as when standing. [name] a long time ago 50 years ago which bothered her R low back. Back pain has remained the same since several years ago. Feels R LE radiating symptoms to her foot (L5 dermatome on R)   Blood pressure is controlled per pt.   No latex allergies     PAIN:  Are you having pain? Yes: NPRS scale: 4/10 Pain location:  R  low back/posterior hip Pain description: catching; dull pain Aggravating factors: weather, picking up sticks in the yard (daughter states pt has a very high tolerance for pain) Relieving factors: ice pack, sitting eases back pain, tylenol   PRECAUTIONS: Fall risk  RED FLAGS: Bowel or bladder incontinence: Yes: Bladder, doctor Scott aware. and Cauda equina syndrome: No   WEIGHT BEARING RESTRICTIONS: No  FALLS:  Has patient fallen in last 6 months? No  LIVING ENVIRONMENT: Lives with: lives alone Lives in: House/apartment Stairs: Yes: Internal: about 12 to the basement to do laundry 2x/week steps; L rail going down to the basement and External: 2 steps; on right going up Has following equipment at home: Single point cane and Shower bench  OCCUPATION: retired  PLOF: Requires assistive device for independence  PATIENT GOALS: Be without the pain. Improve balance.   NEXT MD VISIT: Yes but pt does not know. Has a wellness visit.   OBJECTIVE:  Note: Objective measures were completed at Evaluation unless otherwise noted.  DIAGNOSTIC FINDINGS:  DG Lumbar Spine 2-3 Views 01/30/2024  Narrative & Impression  CLINICAL DATA:  Low back pain, radiating down right leg   EXAM: LUMBAR SPINE - 2-3 VIEW   COMPARISON:  2019   FINDINGS: Alignment is similar with levoscoliosis. Diffusely decreased osseous mineralization in combination with overlying bowel gas limits evaluation. Vertebral body heights appear similar. Multilevel disc space narrowing, endplate osteophytes, and facet hypertrophy.   IMPRESSION: Suboptimal evaluation due to demineralization. Multilevel spondylosis superimposed on scoliosis similar to prior study.     Electronically Signed   By: Geannie Keener M.D.   On: 01/30/2021 09:09    PATIENT SURVEYS:  Modified Oswestry 9/50 (18%) (08/06/2023)   COGNITION: Overall cognitive status: Within functional limits for tasks assessed     SENSATION:   MUSCLE  LENGTH:    POSTURE: forward neck B protracted shoulders, R shoulder lower, forward flexed, R iliac crest and greater trochanter higher, L lumbar convexity around L3, L knee in slight flexion, R knee genu valgus.   PALPATION: TTP to R posterior hip/piriformis (proximal attachment and muscle belly), TTP around R posterior hip around Sciatic nerve.  LUMBAR ROM:   AROM eval  Flexion WFL with R low back pain  Extension Limited with low back pain  Right lateral flexion WFL  Left lateral flexion WFL  Right rotation [facility] with R low back pain  Left rotation WFL   (Blank rows = not tested)  LOWER EXTREMITY ROM:     Passive  Right (09/05/2023) Left (09/05/2023)  Hip flexion    Hip extension    Hip abduction    Hip adduction    Hip internal rotation WFL Limited with reproduction of R posterior hip pain  Hip external rotation    Knee flexion    Knee extension    Ankle dorsiflexion    Ankle plantarflexion    Ankle inversion    Ankle eversion     (Blank rows = not tested)  LOWER EXTREMITY MMT:    MMT Right eval Left eval  Hip flexion 4- 4-  Hip extension (seated manually resisted) 3 3+  Hip abduction (seated manually resisted) 4 4  Hip adduction    Hip internal rotation    Hip external rotation 4 4-  Knee flexion 4 4  Knee extension 4+ 5  Ankle dorsiflexion    Ankle plantarflexion    Ankle inversion    Ankle eversion     (Blank rows = not tested)  LUMBAR SPECIAL TESTS:  FUNCTIONAL TESTS:  5 times sit to stand: unable to test at this time Timed up and go (TUG): 18.24 seconds; 17.24 s, 17.88 seconds with SPC on L; (17.8 seconds average)  Dynamic Gait Index: unable to test at this time Decreased femoral control R with STS   GAIT: Distance walked: 30 ft Assistive device utilized: Single point cane Level of assistance: Modified independence Comments: Antalic, decreased stance R LE, SPC on R, R knee genu valgus during stance phase of gait  TREATMENT DATE: 09/26/2023                                                                                                                                Therapeutic Activities Performed to improve ability to perform standing tasks, transfers more comfortably and with less difficulty.    Seated R hip ER 10x5 seconds for 3 sets yellow band  Seated hip adduction isometrics small blue ball squeeze 10x2 with 5 second holds  Sit <> stand from regular chair with arms, with B UE assist with emphasis on femoral control with yellow band resisting hip abduction/ER with B UE assist  10x  Then no UE assist to sit 10x2 with yellow band.   Standing with B UE assist   Hip abduction with yellow band around knees   R 10x3 with 5 second holds   L 10x3 with 5 seconds holds  Standing static mini forward lunge onto Air Ex pad with B UE assist   With yellow band around knees  10x3 each LE  Standing static mini lateral lunge onto Air Ex Pad with B UE assist.   With yellow band around knees  10x3 each LE   Improved exercise technique, movement at target joints, use of target muscles after mod verbal, visual, tactile cues.           PATIENT EDUCATION:  Education details: there-ex, HEP Person educated: Patient, Child(ren), and Estate manager/land agent Education method: Explanation, Demonstration, Tactile cues, Verbal cues, and Handouts Education comprehension: verbalized understanding and returned demonstration  HOME EXERCISE PROGRAM: Access Code: [email or web address]  URL: [email or web address] Date: 08/08/2023 Prepared by: Suzzane Estes  Exercises - Seated Flexion Stretch  - 3 x daily - 7 x weekly - 3 sets - 10 reps - 5 seconds hold - Seated Transversus Abdominis Bracing  - 3 x daily - 7 x weekly - 3 sets - 10 reps - 5 seconds hold - Seated Gluteal Sets  - 3 x daily - 7 x weekly - 3 sets - 10 reps - 5 seconds hold  - Hooklying Clamshell with Resistance  - 1 x daily - 7 x weekly - 6 sets - 5 reps  Yellow  band  Hooklying Hip extension isometrics, leg straight    R 10x5 seconds for 3 sets    L 10x5 seconds for 3 sets   - Supine Piriformis Stretch  - 3 x daily - 7 x weekly - 1 sets -  5 reps - 30 seconds hold  - Standing Hip Abduction with Counter Support  - 1 x daily - 7 x weekly - 3 sets - 10 reps - 5 second holds hold  Yellow band around knees    ASSESSMENT:  CLINICAL IMPRESSION:  Continued working on improving B femoral control as well as glute strength to promote better LE mechanics and decrease strain on R hip and low back. Pt tolerated session well without aggravation of symptoms. Pt will benefit from continued skilled physical therapy services to decrease pain, improve strength, balance, and function.        OBJECTIVE IMPAIRMENTS: Abnormal gait, decreased balance, difficulty walking, decreased strength, improper body mechanics, postural dysfunction, and pain.   ACTIVITY LIMITATIONS: carrying, lifting, bending, standing, squatting, stairs, transfers, and locomotion level  PARTICIPATION LIMITATIONS:   PERSONAL FACTORS: Age, Fitness, Time since onset of injury/illness/exacerbation, and 3+ comorbidities: heart failure, A-fib, ascending aorta dilation, osteoarthritis, syncope  are also ; affecting patient's functional outcome.   REHAB POTENTIAL: Fair    CLINICAL DECISION MAKING: Stable/uncomplicated  EVALUATION COMPLEXITY: Low   GOALS: Goals reviewed with patient? Yes  SHORT TERM GOALS: Target date: 08/22/2023  Pt will be independent with her initial HEP to decrease pain, improve strength, balance, and function.  Baseline: Pt has not yet started her initial HEP (08/06/2023); No questions, able to perform her HEP (09/12/2023) Goal status: MET    LONG TERM GOALS: Target date: 10/03/2023  Pt will have a decrease in low back pain to 3/10 or less at worst to promote ability to perform standing tasks such as yard work more comfortably.  Baseline: 7/10 low back pain at worst  for the past 3 months (08/06/2023); 3/10 at least at most for the past 7 days (09/12/2023) Goal status: MET   2.  Pt will improve her Modified Oswestry Low Back Pain Disability Questionnaire to 10% or less as a demonstration of improved function.  Baseline: Modified Oswestry 9/50 (18%) (08/06/2023); 12/50 (24%) ( 09/12/2023) Goal status: ONGOING  3.  Pt will improve her B hip extension, ER, and abduction strength by at least 1/2 MMT grade to promote ability to perform closed chain tasks more comfortably for her back.  Baseline:  MMT Right eval Left eval R (09/12/2023) L  (09/12/2023)  Hip extension (seated manually resisted) 3 3+ 4 4  Hip abduction (seated manually resisted) 4 4 4+ 4+  Hip external rotation 4 4- 4 4   (08/06/2023)  Goal status: Partially met  4.  Pt will improver her Timed Up and Go time to 12 seconds or less with her [facility] as a demonstration of improved functional mobility  Baseline: 17.8 seconds average with SPC on L side (08/06/2023); 14.9 seconds averange with SPC on L side (09/12/2023) Goal status: Progressing.     PLAN:  PT FREQUENCY: 2x/week  PT DURATION: 8 weeks  PLANNED INTERVENTIONS: 97110-Therapeutic exercises, 97530- Therapeutic activity, [id number]- Neuromuscular re-education, 97535- Self Care, [id number]- Manual therapy, [phone]- Gait training, [phone]- Canalith repositioning, [id number]- Electrical stimulation (unattended), [phone]- Ionotophoresis 4mg /ml Dexamethasone , Patient/Family education, Balance training, Stair training, Dry Needling, Joint mobilization, and Vestibular training.  PLAN FOR NEXT SESSION: Posture, trunk and hip strength, femoral control, functional strengthening, balance, manual techniques, modalities PRN   Jaquelyn Sakamoto, PT, DPT 09/26/2023, 10:45 AM

## 2023-09-30 ENCOUNTER — Ambulatory Visit: Payer: Medicare Other

## 2023-09-30 DIAGNOSIS — R2681 Unsteadiness on feet: Secondary | ICD-10-CM

## 2023-09-30 DIAGNOSIS — R262 Difficulty in walking, not elsewhere classified: Secondary | ICD-10-CM | POA: Diagnosis not present

## 2023-09-30 DIAGNOSIS — M6281 Muscle weakness (generalized): Secondary | ICD-10-CM

## 2023-09-30 DIAGNOSIS — M5459 Other low back pain: Secondary | ICD-10-CM

## 2023-09-30 NOTE — Therapy (Signed)
 OUTPATIENT PHYSICAL THERAPY TREATMENT  Patient Name: Deborah Baxter MRN: [id number] DOB:[birth year], 89 y.o., female Today's Date: 09/30/2023  END OF SESSION:  PT End of Session - 09/30/23 1035     Visit Number 14    Number of Visits 17    Date for PT Re-Evaluation 10/03/23    Authorization Type Medicare A & B    Progress Note Due on Visit 10    PT Start Time 1035    PT Stop Time 1115    PT Time Calculation (min) 40 min    Equipment Utilized During Treatment Gait belt    Activity Tolerance Patient tolerated treatment well    Behavior During Therapy WFL for tasks assessed/performed                         Past Medical History:  Diagnosis Date   (HFpEF) heart failure with preserved ejection fraction (HCC) 04/24/2021   Pulmonary raised concern - question of pulmonary hypertension secondary to DD.  Question of need for right heart cath   A-fib [facility]) 04/24/2021   Acute heart failure with preserved ejection fraction (HCC)    Ascending aorta dilatation (HCC) 08/01/2021   Atrial fibrillation with rapid ventricular response (HCC) 03/20/2021   Congestion of nasal sinus 10/13/2021   Cough 04/23/2021   Diverticulosis 09/05/2012   Colonoscopy 08/31/12 - redundant colon and diverticulosis in the sigmoid colon, descending colon, transverse colon and hepatic flexure.  Recommend f/u colonoscopy in 10 years.    Elevated bilirubin 09/13/2021   Elevated blood pressure reading 12/05/2020   Health care maintenance 07/13/2014   Physical 07/12/14.  Colonoscopy 08/31/12.  Normal bone density 04/06/10.  Mammogram 05/30/20- Birads I.     Hx of degenerative disc disease    Hypercholesterolemia    Hypertensive urgency 03/20/2021   Ingrown toenail 09/13/2021   Left hand pain 03/20/2021   Low back pain 01/29/2021   Lumbago 07/13/2014   Lumbar spinal stenosis 06/27/2012   Neuromuscular disorder (HCC)    pinched nerve   Numbness and tingling in left hand 08/21/2017   Osteoarthritis    Pleural effusion  09/13/2021   Pulmonary edema 03/20/2021   Seasonal allergies    Spinal stenosis    chronic back and leg pain   Stress 02/03/2021   Syncope 12/05/2020   Thrombocytopenia (HCC) 09/13/2021   Past Surgical History:  Procedure Laterality Date   APPENDECTOMY     bladder tack surgery  2000   BTL  1976   CARDIOVERSION N/A 05/11/2021   Procedure: CARDIOVERSION;  Surgeon: Devorah Fonder, MD;  Location: [facility] ORS;  Service: Cardiovascular;  Laterality: N/A;   CARPAL TUNNEL RELEASE     CATARACT EXTRACTION W/PHACO Left 12/07/2015   Procedure: CATARACT EXTRACTION PHACO AND INTRAOCULAR LENS PLACEMENT (IOC);  Surgeon: Rosa College, MD;  Location: [facility] ORS;  Service: Ophthalmology;  Laterality: Left;  US  1.23 AP% 23.2 CDE 19.28 Fluid Pack Lot # [id number]   EYE SURGERY  03/19/15   GANGLION CYST EXCISION     right hand   GAS/FLUID EXCHANGE Left 03/08/2015   Procedure: GAS/FLUID EXCHANGE;  Surgeon: Stephenie Einstein, MD;  Location: [facility] ORS;  Service: Ophthalmology;  Laterality: Left;   RIGHT HEART CATH N/A 11/06/2021   Procedure: RIGHT HEART CATH;  Surgeon: Sammy Crisp, MD;  Location: [facility] INVASIVE CV [facility];  Service: Cardiovascular;  Laterality: N/A;   TONSILLECTOMY     TUBAL LIGATION     VITRECTOMY 25 GAUGE WITH SCLERAL  BUCKLE Left 03/08/2015   Procedure: VITRECTOMY 25 GAUGE, membrane peel, 26 %  SF6 gas exchange;  Surgeon: Stephenie Einstein, MD;  Location: [facility] ORS;  Service: Ophthalmology;  Laterality: Left;  casette lot # [id number]   Patient Active Problem List   Diagnosis Date Noted   Skin lesion 09/16/2023   Unsteady gait 05/16/2023   Arm pain 08/18/2022   Hx of degenerative disc disease 10/23/2021   Osteoarthritis 10/23/2021   Seasonal allergies 10/23/2021   Spinal stenosis 10/23/2021   Elevated bilirubin 09/13/2021   Thrombocytopenia (HCC) 09/13/2021   Ingrown toenail 09/13/2021   Ascending aorta dilatation (HCC) 08/01/2021   A-fib (HCC) 04/24/2021   (HFpEF) heart failure with  preserved ejection fraction (HCC) 04/24/2021   Acute heart failure with preserved ejection fraction [facility])    Atrial fibrillation with rapid ventricular response (HCC) 03/20/2021   Left hand pain 03/20/2021   Stress 02/03/2021   Low back pain 01/29/2021   Syncope 12/05/2020   Elevated blood pressure reading 12/05/2020   Numbness and tingling in left hand 08/21/2017   Lumbago 07/13/2014   Health care maintenance 07/13/2014   Diverticulosis 09/05/2012   Hypercholesterolemia 06/27/2012   Lumbar spinal stenosis 06/27/2012    PCP: Dellar Fenton, MD  REFERRING PROVIDER: Dellar Fenton, MD  REFERRING DIAG: R26.81 (ICD-10-CM) - Unsteady [phone] (ICD-10-CM) - Right-sided low back pain with right-sided sciatica, unspecified chronicity  Rationale for Evaluation and Treatment: Rehabilitation  THERAPY DIAG:  Other low back pain  Difficulty in walking, not elsewhere classified  Unsteadiness on feet  Muscle weakness (generalized)  ONSET DATE: 05/16/2023 (date PT referral was signed)  SUBJECTIVE:                                                                                                                                                                                           SUBJECTIVE STATEMENT: R low back and hip area is about the same. Balance is better.       PERTINENT HISTORY:  R sided low back pain, and unsteady gait. Chronic, which progressively worsened over the years. Has gone to a chiropractor for a while which stopped helping. Has not had back surgery. No osteoporosis to her knowledge. Balance is also not as good, such as when standing. [name] a long time ago 50 years ago which bothered her R low back. Back pain has remained the same since several years ago. Feels R LE radiating symptoms to her foot (L5 dermatome on R)   Blood pressure is controlled per pt.   No latex allergies     PAIN:  Are you having pain? Yes: NPRS scale: 4/10 Pain location: R  low  back/posterior hip Pain description: catching; dull pain Aggravating factors: weather, picking up sticks in the yard (daughter states pt has a very high tolerance for pain) Relieving factors: ice pack, sitting eases back pain, tylenol   PRECAUTIONS: Fall risk  RED FLAGS: Bowel or bladder incontinence: Yes: Bladder, doctor Scott aware. and Cauda equina syndrome: No   WEIGHT BEARING RESTRICTIONS: No  FALLS:  Has patient fallen in last 6 months? No  LIVING ENVIRONMENT: Lives with: lives alone Lives in: House/apartment Stairs: Yes: Internal: about 12 to the basement to do laundry 2x/week steps; L rail going down to the basement and External: 2 steps; on right going up Has following equipment at home: Single point cane and Shower bench  OCCUPATION: retired  PLOF: Requires assistive device for independence  PATIENT GOALS: Be without the pain. Improve balance.   NEXT MD VISIT: Yes but pt does not know. Has a wellness visit.   OBJECTIVE:  Note: Objective measures were completed at Evaluation unless otherwise noted.  DIAGNOSTIC FINDINGS:  DG Lumbar Spine 2-3 Views 01/30/2024  Narrative & Impression  CLINICAL DATA:  Low back pain, radiating down right leg   EXAM: LUMBAR SPINE - 2-3 VIEW   COMPARISON:  2019   FINDINGS: Alignment is similar with levoscoliosis. Diffusely decreased osseous mineralization in combination with overlying bowel gas limits evaluation. Vertebral body heights appear similar. Multilevel disc space narrowing, endplate osteophytes, and facet hypertrophy.   IMPRESSION: Suboptimal evaluation due to demineralization. Multilevel spondylosis superimposed on scoliosis similar to prior study.     Electronically Signed   By: Geannie Keener M.D.   On: 01/30/2021 09:09    PATIENT SURVEYS:  Modified Oswestry 9/50 (18%) (08/06/2023)   COGNITION: Overall cognitive status: Within functional limits for tasks assessed     SENSATION:   MUSCLE  LENGTH:    POSTURE: forward neck B protracted shoulders, R shoulder lower, forward flexed, R iliac crest and greater trochanter higher, L lumbar convexity around L3, L knee in slight flexion, R knee genu valgus.   PALPATION: TTP to R posterior hip/piriformis (proximal attachment and muscle belly), TTP around R posterior hip around Sciatic nerve.  LUMBAR ROM:   AROM eval  Flexion WFL with R low back pain  Extension Limited with low back pain  Right lateral flexion WFL  Left lateral flexion WFL  Right rotation [facility] with R low back pain  Left rotation WFL   (Blank rows = not tested)  LOWER EXTREMITY ROM:     Passive  Right (09/05/2023) Left (09/05/2023)  Hip flexion    Hip extension    Hip abduction    Hip adduction    Hip internal rotation WFL Limited with reproduction of R posterior hip pain  Hip external rotation    Knee flexion    Knee extension    Ankle dorsiflexion    Ankle plantarflexion    Ankle inversion    Ankle eversion     (Blank rows = not tested)  LOWER EXTREMITY MMT:    MMT Right eval Left eval  Hip flexion 4- 4-  Hip extension (seated manually resisted) 3 3+  Hip abduction (seated manually resisted) 4 4  Hip adduction    Hip internal rotation    Hip external rotation 4 4-  Knee flexion 4 4  Knee extension 4+ 5  Ankle dorsiflexion    Ankle plantarflexion    Ankle inversion    Ankle eversion     (Blank rows = not tested)  LUMBAR SPECIAL  TESTS:    FUNCTIONAL TESTS:  5 times sit to stand: unable to test at this time Timed up and go (TUG): 18.24 seconds; 17.24 s, 17.88 seconds with SPC on L; (17.8 seconds average)  Dynamic Gait Index: unable to test at this time Decreased femoral control R with STS   GAIT: Distance walked: 30 ft Assistive device utilized: Single point cane Level of assistance: Modified independence Comments: Antalic, decreased stance R LE, SPC on R, R knee genu valgus during stance phase of gait  TREATMENT DATE: 09/30/2023                                                                                                                                Therapeutic Activities  Performed to improve ability to perform standing tasks, transfers more comfortably and with less difficulty.    Seated R hip ER 10x5 seconds for 3 sets yellow band  Sit <> stand from regular chair with arms, with B UE assist to stand, no UE assist to sit with emphasis on femoral control with yellow band resisting hip abduction/ER with B UE assist  10x3   Seated hip adduction isometrics small blue ball squeeze 10x2 with 5 second holds  Standing static mini forward lunge onto Air Ex pad with B UE assist   With yellow band around knees  10x3 each LE  Improved exercise technique, movement at target joints, use of target muscles after mod verbal, visual, tactile cues.    Neuromuscular re-education   Stepping over 2 mini hurdles with SPC assist 10x2, alternating LEs  Gait with SPC on L up and down incline on mulch outside about 25 ft each direction 2x with PT CGA   Improved technique, movement at target joints, use of target muscles after mod verbal, visual, tactile cues.         PATIENT EDUCATION:  Education details: there-ex, HEP Person educated: Patient, Child(ren), and Estate manager/land agent Education method: Explanation, Demonstration, Tactile cues, Verbal cues, and Handouts Education comprehension: verbalized understanding and returned demonstration  HOME EXERCISE PROGRAM: Access Code: [email or web address]  URL: [email or web address] Date: 08/08/2023 Prepared by: Suzzane Estes  Exercises - Seated Flexion Stretch  - 3 x daily - 7 x weekly - 3 sets - 10 reps - 5 seconds hold - Seated Transversus Abdominis Bracing  - 3 x daily - 7 x weekly - 3 sets - 10 reps - 5 seconds hold - Seated Gluteal Sets  - 3 x daily - 7 x weekly - 3 sets - 10 reps - 5 seconds hold  - Hooklying Clamshell with Resistance  - 1 x daily - 7 x  weekly - 6 sets - 5 reps  Yellow band  Hooklying Hip extension isometrics, leg straight    R 10x5 seconds for 3 sets    L 10x5 seconds for 3 sets   - Supine Piriformis Stretch  - 3 x daily - 7 x weekly - 1 sets -  5 reps - 30 seconds hold  - Standing Hip Abduction with Counter Support  - 1 x daily - 7 x weekly - 3 sets - 10 reps - 5 second holds hold  Yellow band around knees    ASSESSMENT:  CLINICAL IMPRESSION:  Continued working on improving B femoral control as well as glute strength to promote better LE mechanics and decrease strain on R hip and low back. Also worked on Orthoptist as well as ambulating on uneven surface with SPC to continue to improve balance and decrease fall risk. Pt tolerated session well without aggravation of symptoms. Pt will benefit from continued skilled physical therapy services to decrease pain, improve strength, balance, and function.        OBJECTIVE IMPAIRMENTS: Abnormal gait, decreased balance, difficulty walking, decreased strength, improper body mechanics, postural dysfunction, and pain.   ACTIVITY LIMITATIONS: carrying, lifting, bending, standing, squatting, stairs, transfers, and locomotion level  PARTICIPATION LIMITATIONS:   PERSONAL FACTORS: Age, Fitness, Time since onset of injury/illness/exacerbation, and 3+ comorbidities: heart failure, A-fib, ascending aorta dilation, osteoarthritis, syncope  are also ; affecting patient's functional outcome.   REHAB POTENTIAL: Fair    CLINICAL DECISION MAKING: Stable/uncomplicated  EVALUATION COMPLEXITY: Low   GOALS: Goals reviewed with patient? Yes  SHORT TERM GOALS: Target date: 08/22/2023  Pt will be independent with her initial HEP to decrease pain, improve strength, balance, and function.  Baseline: Pt has not yet started her initial HEP (08/06/2023); No questions, able to perform her HEP (09/12/2023) Goal status: MET    LONG TERM GOALS: Target date: 10/03/2023  Pt will have a  decrease in low back pain to 3/10 or less at worst to promote ability to perform standing tasks such as yard work more comfortably.  Baseline: 7/10 low back pain at worst for the past 3 months (08/06/2023); 3/10 at least at most for the past 7 days (09/12/2023) Goal status: MET   2.  Pt will improve her Modified Oswestry Low Back Pain Disability Questionnaire to 10% or less as a demonstration of improved function.  Baseline: Modified Oswestry 9/50 (18%) (08/06/2023); 12/50 (24%) ( 09/12/2023) Goal status: ONGOING  3.  Pt will improve her B hip extension, ER, and abduction strength by at least 1/2 MMT grade to promote ability to perform closed chain tasks more comfortably for her back.  Baseline:  MMT Right eval Left eval R (09/12/2023) L  (09/12/2023)  Hip extension (seated manually resisted) 3 3+ 4 4  Hip abduction (seated manually resisted) 4 4 4+ 4+  Hip external rotation 4 4- 4 4   (08/06/2023)  Goal status: Partially met  4.  Pt will improver her Timed Up and Go time to 12 seconds or less with her [facility] as a demonstration of improved functional mobility  Baseline: 17.8 seconds average with SPC on L side (08/06/2023); 14.9 seconds averange with SPC on L side (09/12/2023) Goal status: Progressing.     PLAN:  PT FREQUENCY: 2x/week  PT DURATION: 8 weeks  PLANNED INTERVENTIONS: 97110-Therapeutic exercises, 97530- Therapeutic activity, [id number]- Neuromuscular re-education, 97535- Self Care, [id number]- Manual therapy, [phone]- Gait training, [phone]- Canalith repositioning, [id number]- Electrical stimulation (unattended), [phone]- Ionotophoresis 4mg /ml Dexamethasone , Patient/Family education, Balance training, Stair training, Dry Needling, Joint mobilization, and Vestibular training.  PLAN FOR NEXT SESSION: Posture, trunk and hip strength, femoral control, functional strengthening, balance, manual techniques, modalities PRN   Rakesh Dutko, PT, DPT 09/30/2023, 11:37 AM

## 2023-10-07 ENCOUNTER — Ambulatory Visit: Attending: Internal Medicine

## 2023-10-07 DIAGNOSIS — M6281 Muscle weakness (generalized): Secondary | ICD-10-CM | POA: Insufficient documentation

## 2023-10-07 DIAGNOSIS — R262 Difficulty in walking, not elsewhere classified: Secondary | ICD-10-CM | POA: Insufficient documentation

## 2023-10-07 DIAGNOSIS — M5459 Other low back pain: Secondary | ICD-10-CM | POA: Insufficient documentation

## 2023-10-07 DIAGNOSIS — R2681 Unsteadiness on feet: Secondary | ICD-10-CM | POA: Insufficient documentation

## 2023-10-07 NOTE — Therapy (Signed)
 OUTPATIENT PHYSICAL THERAPY TREATMENT  Patient Name: RUCHEL COMP MRN: [id number] DOB:January 08, 1934, 89 y.o., female Today's Date: 10/07/2023  END OF SESSION:  PT End of Session - 10/07/23 1434     Visit Number 15    Number of Visits 18    Date for PT Re-Evaluation 10/17/23    Authorization Type Medicare A & B    Progress Note Due on Visit 10    PT Start Time 1434    PT Stop Time 1518    PT Time Calculation (min) 44 min    Equipment Utilized During Treatment Gait belt    Activity Tolerance Patient tolerated treatment well    Behavior During Therapy WFL for tasks assessed/performed                          Past Medical History:  Diagnosis Date   (HFpEF) heart failure with preserved ejection fraction (HCC) 04/24/2021   Pulmonary raised concern - question of pulmonary hypertension secondary to DD.  Question of need for right heart cath   A-fib [facility]) 04/24/2021   Acute heart failure with preserved ejection fraction (HCC)    Ascending aorta dilatation (HCC) 08/01/2021   Atrial fibrillation with rapid ventricular response (HCC) 03/20/2021   Congestion of nasal sinus 10/13/2021   Cough 04/23/2021   Diverticulosis 09/05/2012   Colonoscopy 08/31/12 - redundant colon and diverticulosis in the sigmoid colon, descending colon, transverse colon and hepatic flexure.  Recommend f/u colonoscopy in 10 years.    Elevated bilirubin 09/13/2021   Elevated blood pressure reading 12/05/2020   Health care maintenance 07/13/2014   Physical 07/12/14.  Colonoscopy 08/31/12.  Normal bone density 04/06/10.  Mammogram 05/30/20- Birads I.     Hx of degenerative disc disease    Hypercholesterolemia    Hypertensive urgency 03/20/2021   Ingrown toenail 09/13/2021   Left hand pain 03/20/2021   Low back pain 01/29/2021   Lumbago 07/13/2014   Lumbar spinal stenosis 06/27/2012   Neuromuscular disorder (HCC)    pinched nerve   Numbness and tingling in left hand 08/21/2017   Osteoarthritis    Pleural effusion  09/13/2021   Pulmonary edema 03/20/2021   Seasonal allergies    Spinal stenosis    chronic back and leg pain   Stress 02/03/2021   Syncope 12/05/2020   Thrombocytopenia (HCC) 09/13/2021   Past Surgical History:  Procedure Laterality Date   APPENDECTOMY     bladder tack surgery  2000   BTL  1976   CARDIOVERSION N/A 05/11/2021   Procedure: CARDIOVERSION;  Surgeon: Devorah Fonder, MD;  Location: [facility];  Service: Cardiovascular;  Laterality: N/A;   CARPAL TUNNEL RELEASE     CATARACT EXTRACTION W/PHACO Left 12/07/2015   Procedure: CATARACT EXTRACTION PHACO AND INTRAOCULAR LENS PLACEMENT (IOC);  Surgeon: Rosa College, MD;  Location: [facility];  Service: Ophthalmology;  Laterality: Left;  US  1.23 AP% 23.2 CDE 19.28 Fluid Pack Lot # [id number]   EYE SURGERY  03/19/15   GANGLION CYST EXCISION     right hand   GAS/FLUID EXCHANGE Left 03/08/2015   Procedure: GAS/FLUID EXCHANGE;  Surgeon: Stephenie Einstein, MD;  Location: [facility];  Service: Ophthalmology;  Laterality: Left;   RIGHT HEART CATH N/A 11/06/2021   Procedure: RIGHT HEART CATH;  Surgeon: Sammy Crisp, MD;  Location: [facility];  Service: Cardiovascular;  Laterality: N/A;   TONSILLECTOMY     TUBAL LIGATION     VITRECTOMY 25 GAUGE WITH  SCLERAL BUCKLE Left 03/08/2015   Procedure: VITRECTOMY 25 GAUGE, membrane peel, 26 %  SF6 gas exchange;  Surgeon: Stephenie Einstein, MD;  Location: [facility];  Service: Ophthalmology;  Laterality: Left;  casette lot # [id number]   Patient Active Problem List   Diagnosis Date Noted   Skin lesion 09/16/2023   Unsteady gait 05/16/2023   Arm pain 08/18/2022   Hx of degenerative disc disease 10/23/2021   Osteoarthritis 10/23/2021   Seasonal allergies 10/23/2021   Spinal stenosis 10/23/2021   Elevated bilirubin 09/13/2021   Thrombocytopenia (HCC) 09/13/2021   Ingrown toenail 09/13/2021   Ascending aorta dilatation (HCC) 08/01/2021   A-fib (HCC) 04/24/2021   (HFpEF) heart failure with  preserved ejection fraction (HCC) 04/24/2021   Acute heart failure with preserved ejection fraction (HCC)    Atrial fibrillation with rapid ventricular response (HCC) 03/20/2021   Left hand pain 03/20/2021   Stress 02/03/2021   Low back pain 01/29/2021   Syncope 12/05/2020   Elevated blood pressure reading 12/05/2020   Numbness and tingling in left hand 08/21/2017   Lumbago 07/13/2014   Health care maintenance 07/13/2014   Diverticulosis 09/05/2012   Hypercholesterolemia 06/27/2012   Lumbar spinal stenosis 06/27/2012    PCP: Dellar Fenton, MD  REFERRING PROVIDER: Dellar Fenton, MD  REFERRING DIAG: R26.81 (ICD-10-CM) - Unsteady [phone] (ICD-10-CM) - Right-sided low back pain with right-sided sciatica, unspecified chronicity  Rationale for Evaluation and Treatment: Rehabilitation  THERAPY DIAG:  Other low back pain - Plan: PT plan of care cert/re-cert  Difficulty in walking, not elsewhere classified - Plan: PT plan of care cert/re-cert  Unsteadiness on feet - Plan: PT plan of care cert/re-cert  Muscle weakness (generalized) - Plan: PT plan of care cert/re-cert  ONSET DATE: 05/16/2023 (date PT referral was signed)  SUBJECTIVE:                                                                                                                                                                                           SUBJECTIVE STATEMENT: R low back and hip area has been no change, 1-2/10 currently and at worst for the past 7 days. Balance is good.         PERTINENT HISTORY:  R sided low back pain, and unsteady gait. Chronic, which progressively worsened over the years. Has gone to a chiropractor for a while which stopped helping. Has not had back surgery. No osteoporosis to her knowledge. Balance is also not as good, such as when standing. [name] a long time ago 50 years ago which bothered her R low back. Back pain has remained the same since several years ago. Feels  R LE  radiating symptoms to her foot (L5 dermatome on R)   Blood pressure is controlled per pt.   No latex allergies     PAIN:  Are you having pain? Yes: NPRS scale: 4/10 Pain location: R low back/posterior hip Pain description: catching; dull pain Aggravating factors: weather, picking up sticks in the yard (daughter states pt has a very high tolerance for pain) Relieving factors: ice pack, sitting eases back pain, tylenol   PRECAUTIONS: Fall risk  RED FLAGS: Bowel or bladder incontinence: Yes: Bladder, doctor Scott aware. and Cauda equina syndrome: No   WEIGHT BEARING RESTRICTIONS: No  FALLS:  Has patient fallen in last 6 months? No  LIVING ENVIRONMENT: Lives with: lives alone Lives in: House/apartment Stairs: Yes: Internal: about 12 to the basement to do laundry 2x/week steps; L rail going down to the basement and External: 2 steps; on right going up Has following equipment at home: Single point cane and Shower bench  OCCUPATION: retired  PLOF: Requires assistive device for independence  PATIENT GOALS: Be without the pain. Improve balance.   NEXT MD VISIT: Yes but pt does not know. Has a wellness visit.   OBJECTIVE:  Note: Objective measures were completed at Evaluation unless otherwise noted.  DIAGNOSTIC FINDINGS:  DG Lumbar Spine 2-3 Views 01/30/2024  Narrative & Impression  CLINICAL DATA:  Low back pain, radiating down right leg   EXAM: LUMBAR SPINE - 2-3 VIEW   COMPARISON:  2019   FINDINGS: Alignment is similar with levoscoliosis. Diffusely decreased osseous mineralization in combination with overlying bowel gas limits evaluation. Vertebral body heights appear similar. Multilevel disc space narrowing, endplate osteophytes, and facet hypertrophy.   IMPRESSION: Suboptimal evaluation due to demineralization. Multilevel spondylosis superimposed on scoliosis similar to prior study.     Electronically Signed   By: Geannie Keener M.D.   On: 01/30/2021 09:09     PATIENT SURVEYS:  Modified Oswestry 9/50 (18%) (08/06/2023)   COGNITION: Overall cognitive status: Within functional limits for tasks assessed     SENSATION:   MUSCLE LENGTH:    POSTURE: forward neck B protracted shoulders, R shoulder lower, forward flexed, R iliac crest and greater trochanter higher, L lumbar convexity around L3, L knee in slight flexion, R knee genu valgus.   PALPATION: TTP to R posterior hip/piriformis (proximal attachment and muscle belly), TTP around R posterior hip around Sciatic nerve.  LUMBAR ROM:   AROM eval  Flexion WFL with R low back pain  Extension Limited with low back pain  Right lateral flexion WFL  Left lateral flexion WFL  Right rotation [facility] with R low back pain  Left rotation WFL   (Blank rows = not tested)  LOWER EXTREMITY ROM:     Passive  Right (09/05/2023) Left (09/05/2023)  Hip flexion    Hip extension    Hip abduction    Hip adduction    Hip internal rotation WFL Limited with reproduction of R posterior hip pain  Hip external rotation    Knee flexion    Knee extension    Ankle dorsiflexion    Ankle plantarflexion    Ankle inversion    Ankle eversion     (Blank rows = not tested)  LOWER EXTREMITY MMT:    MMT Right eval Left eval  Hip flexion 4- 4-  Hip extension (seated manually resisted) 3 3+  Hip abduction (seated manually resisted) 4 4  Hip adduction    Hip internal rotation    Hip external rotation 4  4-  Knee flexion 4 4  Knee extension 4+ 5  Ankle dorsiflexion    Ankle plantarflexion    Ankle inversion    Ankle eversion     (Blank rows = not tested)  LUMBAR SPECIAL TESTS:    FUNCTIONAL TESTS:  5 times sit to stand: unable to test at this time Timed up and go (TUG): 18.24 seconds; 17.24 s, 17.88 seconds with SPC on L; (17.8 seconds average)  Dynamic Gait Index: unable to test at this time Decreased femoral control R with STS   GAIT: Distance walked: 30 ft Assistive device utilized: Single point  cane Level of assistance: Modified independence Comments: Antalic, decreased stance R LE, SPC on R, R knee genu valgus during stance phase of gait  TREATMENT DATE: 10/06/2023                                                                                                                               Therapeutic Activities  Performed to improve ability to perform standing tasks, transfers more comfortably and with less difficulty.   Manually resisted seated hip extension, hip abduction, hip ER 1x each way for each LE  Seated R hip ER 10x5 seconds for 3 sets yellow band  Standing up from a chair, walking 10 ft forward, then returning 10 ft, then sitting back onto chair 3x  With SPC on L side: 18.82 seconds, 15.80 seconds, 15.12 seconds (16.58 seconds on average with SPC on L side)  Reviewed progress/current status with PT towards goals   Directed patient with gait with normal gait speed, with changes in speed, 180 degree pivot turn, with R and L cervical rotation position, with cervical flexion and extension position, stepping around obstacles, stepping over an obstacle, ascending and descending 4 regular steps with UE assist   Unable to step over shoe box; both LE's has to circumduct DGI score 15/24  Stepping over 2 mini hurdles with SPC 10x2  LOB x 1 and tripped on a mini hurdle with L foot 1x, all when on R single leg stance.      Improved technique, movement at target joints, use of target muscles after mod verbal, visual, tactile cues.         PATIENT EDUCATION:  Education details: there-ex, HEP Person educated: Patient, Child(ren), and Estate manager/land agent Education method: Explanation, Demonstration, Tactile cues, Verbal cues, and Handouts Education comprehension: verbalized understanding and returned demonstration  HOME EXERCISE PROGRAM: Access Code: [email or web address]  URL: [email or web address] Date: 08/08/2023 Prepared by: Suzzane Estes  Exercises -  Seated Flexion Stretch  - 3 x daily - 7 x weekly - 3 sets - 10 reps - 5 seconds hold - Seated Transversus Abdominis Bracing  - 3 x daily - 7 x weekly - 3 sets - 10 reps - 5 seconds hold - Seated Gluteal Sets  - 3 x daily - 7 x weekly - 3 sets - 10 reps - 5 seconds hold  -  Hooklying Clamshell with Resistance  - 1 x daily - 7 x weekly - 6 sets - 5 reps  Yellow band  Hooklying Hip extension isometrics, leg straight    R 10x5 seconds for 3 sets    L 10x5 seconds for 3 sets   - Supine Piriformis Stretch  - 3 x daily - 7 x weekly - 1 sets - 5 reps - 30 seconds hold  - Standing Hip Abduction with Counter Support  - 1 x daily - 7 x weekly - 3 sets - 10 reps - 5 second holds hold  Yellow band around knees    ASSESSMENT:  CLINICAL IMPRESSION:  Pt demonstrates overall improved B hip strength, functional mobility, and significant improvement in R low back and hip pain since initial evaluation. Pt making progress with PT towards goals. Pt still demonstrates difficulty with stepping over and clearing an obstacle with SPC, especially when standing on her R LE secondary to weakness. Pt will benefit from continued skilled physical therapy services to continue improving strength, balance and function.       OBJECTIVE IMPAIRMENTS: Abnormal gait, decreased balance, difficulty walking, decreased strength, improper body mechanics, postural dysfunction, and pain.   ACTIVITY LIMITATIONS: carrying, lifting, bending, standing, squatting, stairs, transfers, and locomotion level  PARTICIPATION LIMITATIONS:   PERSONAL FACTORS: Age, Fitness, Time since onset of injury/illness/exacerbation, and 3+ comorbidities: heart failure, A-fib, ascending aorta dilation, osteoarthritis, syncope  are also ; affecting patient's functional outcome.   REHAB POTENTIAL: Fair    CLINICAL DECISION MAKING: Stable/uncomplicated  EVALUATION COMPLEXITY: Low   GOALS: Goals reviewed with patient? Yes  SHORT TERM GOALS: Target  date: 08/22/2023  Pt will be independent with her initial HEP to decrease pain, improve strength, balance, and function.  Baseline: Pt has not yet started her initial HEP (08/06/2023); No questions, able to perform her HEP (09/12/2023) Goal status: MET    LONG TERM GOALS: Target date: 10/17/2023  Pt will have a decrease in low back pain to 3/10 or less at worst to promote ability to perform standing tasks such as yard work more comfortably.  Baseline: 7/10 low back pain at worst for the past 3 months (08/06/2023); 3/10 at least at most for the past 7 days (09/12/2023); 1-2/10 at worst for the past 7 days (10/06/2023) Goal status: MET   2.  Pt will improve her Modified Oswestry Low Back Pain Disability Questionnaire to 10% or less as a demonstration of improved function.  Baseline: Modified Oswestry 9/50 (18%) (08/06/2023); 12/50 (24%) ( 09/12/2023) Goal status: ONGOING  3.  Pt will improve her B hip extension, ER, and abduction strength by at least 1/2 MMT grade to promote ability to perform closed chain tasks more comfortably for her back.  Baseline:  MMT Right eval Left eval R (09/12/2023) L  (09/12/2023) R (10/06/2023) L (10/06/2023)  Hip extension (seated manually resisted) 3 3+ 4 4 4+ 4  Hip abduction (seated manually resisted) 4 4 4+ 4+ 5 5  Hip external rotation 4 4- 4 4 4 4    (08/06/2023)  Goal status: Potentially met (except R hip ER)     4.  Pt will improver her Timed Up and Go time to 12 seconds or less with her [facility] as a demonstration of improved functional mobility  Baseline: 17.8 seconds average with SPC on L side (08/06/2023); 14.9 seconds averange with SPC on L side (09/12/2023); 16.58 seconds on average with SPC on L side (10/06/2023) Goal status: Progressing.     5.  Pt will improve her Dynamic Gait Index (DGI) Score to 19/24 or more as a demonstration of improved balance.  Baseline: DGI score: 15/24 (10/06/2023) Goal status: INITIAL    PLAN:  PT FREQUENCY: 2x/week  PT  DURATION: 2 weeks  PLANNED INTERVENTIONS: 97110-Therapeutic exercises, 97530- Therapeutic activity, 97112- Neuromuscular re-education, 97535- Self Care, 78469- Manual therapy, [phone]- Gait training, [phone]- Canalith repositioning, [id number]- Electrical stimulation (unattended), [phone]- Ionotophoresis 4mg /ml Dexamethasone , Patient/Family education, Balance training, Stair training, Dry Needling, Joint mobilization, and Vestibular training.  PLAN FOR NEXT SESSION: Posture, trunk and hip strength, femoral control, functional strengthening, balance, manual techniques, modalities PRN   Malin Sambrano, PT, DPT 10/07/2023, 3:39 PM

## 2023-10-13 ENCOUNTER — Ambulatory Visit

## 2023-10-13 DIAGNOSIS — M6281 Muscle weakness (generalized): Secondary | ICD-10-CM | POA: Diagnosis not present

## 2023-10-13 DIAGNOSIS — R2681 Unsteadiness on feet: Secondary | ICD-10-CM

## 2023-10-13 DIAGNOSIS — M5459 Other low back pain: Secondary | ICD-10-CM

## 2023-10-13 DIAGNOSIS — R262 Difficulty in walking, not elsewhere classified: Secondary | ICD-10-CM

## 2023-10-13 NOTE — Therapy (Signed)
 OUTPATIENT PHYSICAL THERAPY TREATMENT  Patient Name: Deborah Baxter MRN: [id number] DOB:[birth year], 89 y.o., female Today's Date: 10/13/2023  END OF SESSION:  PT End of Session - 10/13/23 1517     Visit Number 16    Number of Visits 18    Date for PT Re-Evaluation 10/17/23    Authorization Type Medicare A & B    Progress Note Due on Visit 10    PT Start Time 1518    PT Stop Time 1600    PT Time Calculation (min) 42 min    Equipment Utilized During Treatment Gait belt    Activity Tolerance Patient tolerated treatment well    Behavior During Therapy WFL for tasks assessed/performed                           Past Medical History:  Diagnosis Date   (HFpEF) heart failure with preserved ejection fraction (HCC) 04/24/2021   Pulmonary raised concern - question of pulmonary hypertension secondary to DD.  Question of need for right heart cath   A-fib [facility]) 04/24/2021   Acute heart failure with preserved ejection fraction (HCC)    Ascending aorta dilatation (HCC) 08/01/2021   Atrial fibrillation with rapid ventricular response (HCC) 03/20/2021   Congestion of nasal sinus 10/13/2021   Cough 04/23/2021   Diverticulosis 09/05/2012   Colonoscopy 08/31/12 - redundant colon and diverticulosis in the sigmoid colon, descending colon, transverse colon and hepatic flexure.  Recommend f/u colonoscopy in 10 years.    Elevated bilirubin 09/13/2021   Elevated blood pressure reading 12/05/2020   Health care maintenance 07/13/2014   Physical 07/12/14.  Colonoscopy 08/31/12.  Normal bone density 04/06/10.  Mammogram 05/30/20- Birads I.     Hx of degenerative disc disease    Hypercholesterolemia    Hypertensive urgency 03/20/2021   Ingrown toenail 09/13/2021   Left hand pain 03/20/2021   Low back pain 01/29/2021   Lumbago 07/13/2014   Lumbar spinal stenosis 06/27/2012   Neuromuscular disorder (HCC)    pinched nerve   Numbness and tingling in left hand 08/21/2017   Osteoarthritis    Pleural  effusion 09/13/2021   Pulmonary edema 03/20/2021   Seasonal allergies    Spinal stenosis    chronic back and leg pain   Stress 02/03/2021   Syncope 12/05/2020   Thrombocytopenia (HCC) 09/13/2021   Past Surgical History:  Procedure Laterality Date   APPENDECTOMY     bladder tack surgery  2000   BTL  1976   CARDIOVERSION N/A 05/11/2021   Procedure: CARDIOVERSION;  Surgeon: Devorah Fonder, MD;  Location: [facility] ORS;  Service: Cardiovascular;  Laterality: N/A;   CARPAL TUNNEL RELEASE     CATARACT EXTRACTION W/PHACO Left 12/07/2015   Procedure: CATARACT EXTRACTION PHACO AND INTRAOCULAR LENS PLACEMENT (IOC);  Surgeon: Rosa College, MD;  Location: [facility] ORS;  Service: Ophthalmology;  Laterality: Left;  US  1.23 AP% 23.2 CDE 19.28 Fluid Pack Lot # [id number]   EYE SURGERY  03/19/15   GANGLION CYST EXCISION     right hand   GAS/FLUID EXCHANGE Left 03/08/2015   Procedure: GAS/FLUID EXCHANGE;  Surgeon: Stephenie Einstein, MD;  Location: [facility] ORS;  Service: Ophthalmology;  Laterality: Left;   RIGHT HEART CATH N/A 11/06/2021   Procedure: RIGHT HEART CATH;  Surgeon: Sammy Crisp, MD;  Location: [facility] INVASIVE [facility];  Service: Cardiovascular;  Laterality: N/A;   TONSILLECTOMY     TUBAL LIGATION     VITRECTOMY 25 GAUGE  WITH SCLERAL BUCKLE Left 03/08/2015   Procedure: VITRECTOMY 25 GAUGE, membrane peel, 26 %  SF6 gas exchange;  Surgeon: Stephenie Einstein, MD;  Location: [facility] ORS;  Service: Ophthalmology;  Laterality: Left;  casette lot # [id number]   Patient Active Problem List   Diagnosis Date Noted   Skin lesion 09/16/2023   Unsteady gait 05/16/2023   Arm pain 08/18/2022   Hx of degenerative disc disease 10/23/2021   Osteoarthritis 10/23/2021   Seasonal allergies 10/23/2021   Spinal stenosis 10/23/2021   Elevated bilirubin 09/13/2021   Thrombocytopenia (HCC) 09/13/2021   Ingrown toenail 09/13/2021   Ascending aorta dilatation (HCC) 08/01/2021   A-fib (HCC) 04/24/2021   (HFpEF) heart failure with  preserved ejection fraction (HCC) 04/24/2021   Acute heart failure with preserved ejection fraction [facility])    Atrial fibrillation with rapid ventricular response (HCC) 03/20/2021   Left hand pain 03/20/2021   Stress 02/03/2021   Low back pain 01/29/2021   Syncope 12/05/2020   Elevated blood pressure reading 12/05/2020   Numbness and tingling in left hand 08/21/2017   Lumbago 07/13/2014   Health care maintenance 07/13/2014   Diverticulosis 09/05/2012   Hypercholesterolemia 06/27/2012   Lumbar spinal stenosis 06/27/2012    PCP: Dellar Fenton, MD  REFERRING PROVIDER: Dellar Fenton, MD  REFERRING DIAG: R26.81 (ICD-10-CM) - Unsteady [phone] (ICD-10-CM) - Right-sided low back pain with right-sided sciatica, unspecified chronicity  Rationale for Evaluation and Treatment: Rehabilitation  THERAPY DIAG:  Other low back pain  Difficulty in walking, not elsewhere classified  Muscle weakness (generalized)  Unsteadiness on feet  ONSET DATE: 05/16/2023 (date PT referral was signed)  SUBJECTIVE:                                                                                                                                                                                           SUBJECTIVE STATEMENT: R low back and hip area is ok, the rainy weather makes it a little harder though. Pt states that her family wants her to continue PT.          PERTINENT HISTORY:  R sided low back pain, and unsteady gait. Chronic, which progressively worsened over the years. Has gone to a chiropractor for a while which stopped helping. Has not had back surgery. No osteoporosis to her knowledge. Balance is also not as good, such as when standing. [name] a long time ago 50 years ago which bothered her R low back. Back pain has remained the same since several years ago. Feels R LE radiating symptoms to her foot (L5 dermatome on R)   Blood pressure is controlled per pt.   No  latex  allergies     PAIN:  Are you having pain? Yes: NPRS scale: 4/10 Pain location: R low back/posterior hip Pain description: catching; dull pain Aggravating factors: weather, picking up sticks in the yard (daughter states pt has a very high tolerance for pain) Relieving factors: ice pack, sitting eases back pain, tylenol   PRECAUTIONS: Fall risk  RED FLAGS: Bowel or bladder incontinence: Yes: Bladder, doctor Scott aware. and Cauda equina syndrome: No   WEIGHT BEARING RESTRICTIONS: No  FALLS:  Has patient fallen in last 6 months? No  LIVING ENVIRONMENT: Lives with: lives alone Lives in: House/apartment Stairs: Yes: Internal: about 12 to the basement to do laundry 2x/week steps; L rail going down to the basement and External: 2 steps; on right going up Has following equipment at home: Single point cane and Shower bench  OCCUPATION: retired  PLOF: Requires assistive device for independence  PATIENT GOALS: Be without the pain. Improve balance.   NEXT MD VISIT: Yes but pt does not know. Has a wellness visit.   OBJECTIVE:  Note: Objective measures were completed at Evaluation unless otherwise noted.  DIAGNOSTIC FINDINGS:  DG Lumbar Spine 2-3 Views 01/30/2024  Narrative & Impression  CLINICAL DATA:  Low back pain, radiating down right leg   EXAM: LUMBAR SPINE - 2-3 VIEW   COMPARISON:  2019   FINDINGS: Alignment is similar with levoscoliosis. Diffusely decreased osseous mineralization in combination with overlying bowel gas limits evaluation. Vertebral body heights appear similar. Multilevel disc space narrowing, endplate osteophytes, and facet hypertrophy.   IMPRESSION: Suboptimal evaluation due to demineralization. Multilevel spondylosis superimposed on scoliosis similar to prior study.     Electronically Signed   By: Geannie Keener M.D.   On: 01/30/2021 09:09    PATIENT SURVEYS:  Modified Oswestry 9/50 (18%) (08/06/2023)   COGNITION: Overall cognitive status:  Within functional limits for tasks assessed     SENSATION:   MUSCLE LENGTH:    POSTURE: forward neck B protracted shoulders, R shoulder lower, forward flexed, R iliac crest and greater trochanter higher, L lumbar convexity around L3, L knee in slight flexion, R knee genu valgus.   PALPATION: TTP to R posterior hip/piriformis (proximal attachment and muscle belly), TTP around R posterior hip around Sciatic nerve.  LUMBAR ROM:   AROM eval  Flexion WFL with R low back pain  Extension Limited with low back pain  Right lateral flexion WFL  Left lateral flexion WFL  Right rotation [facility] with R low back pain  Left rotation WFL   (Blank rows = not tested)  LOWER EXTREMITY ROM:     Passive  Right (09/05/2023) Left (09/05/2023)  Hip flexion    Hip extension    Hip abduction    Hip adduction    Hip internal rotation WFL Limited with reproduction of R posterior hip pain  Hip external rotation    Knee flexion    Knee extension    Ankle dorsiflexion    Ankle plantarflexion    Ankle inversion    Ankle eversion     (Blank rows = not tested)  LOWER EXTREMITY MMT:    MMT Right eval Left eval  Hip flexion 4- 4-  Hip extension (seated manually resisted) 3 3+  Hip abduction (seated manually resisted) 4 4  Hip adduction    Hip internal rotation    Hip external rotation 4 4-  Knee flexion 4 4  Knee extension 4+ 5  Ankle dorsiflexion    Ankle plantarflexion  Ankle inversion    Ankle eversion     (Blank rows = not tested)  LUMBAR SPECIAL TESTS:    FUNCTIONAL TESTS:  5 times sit to stand: unable to test at this time Timed up and go (TUG): 18.24 seconds; 17.24 s, 17.88 seconds with SPC on L; (17.8 seconds average)  Dynamic Gait Index: unable to test at this time Decreased femoral control R with STS   GAIT: Distance walked: 30 ft Assistive device utilized: Single point cane Level of assistance: Modified independence Comments: Antalic, decreased stance R LE, SPC on R, R  knee genu valgus during stance phase of gait  TREATMENT DATE: 10/13/2023                                                                                                                               Therapeutic Activities  Performed to improve ability to perform standing tasks, transfers more comfortably and with less difficulty.   Seated R hip ER 10x5 seconds for 3 sets yellow band  Seated hip adduction isometrics small blue ball squeeze 10x2 with 5 second holds  Standing hip abduction with B UE assist  Yellow band around ankles   R 10x3   L 10x3  Standing static mini forward lunge onto Air Ex pad with B UE assist                With yellow band around knees               10x3 each LE  Reviewed POC: continue for 5 more weeks for 2x/week after today based on pt request to continue secondary to family stating that she is improving and would like for her to continue.      Improved technique, movement at target joints, use of target muscles after mod verbal, visual, tactile cues.     Neuromuscular re-education  Stepping over 2 mini hurdles with SPC 10x2  LOB x 1 when on R LE, and pt tripped on mini hurdle with R LE 1x during first set  Tripped with R foot onto mini hurdle on 9th repetition on 2nd set.   SLS on R LE with L UE assist with L foot on Air Ex pad, emphasis on level pelvis 10x5 seconds for 2 sets   Improved technique, movement at target joints, use of target muscles after mod verbal, visual, tactile cues.           PATIENT EDUCATION:  Education details: there-ex, HEP Person educated: Patient, Child(ren), and Estate manager/land agent Education method: Explanation, Demonstration, Tactile cues, Verbal cues, and Handouts Education comprehension: verbalized understanding and returned demonstration  HOME EXERCISE PROGRAM: Access Code: [email or web address]  URL: [email or web address] Date: 08/08/2023 Prepared by: Suzzane Estes  Exercises - Seated Flexion  Stretch  - 3 x daily - 7 x weekly - 3 sets - 10 reps - 5 seconds hold - Seated Transversus Abdominis Bracing  - 3 x daily - 7 x weekly -  3 sets - 10 reps - 5 seconds hold - Seated Gluteal Sets  - 3 x daily - 7 x weekly - 3 sets - 10 reps - 5 seconds hold  - Hooklying Clamshell with Resistance  - 1 x daily - 7 x weekly - 6 sets - 5 reps  Yellow band  Hooklying Hip extension isometrics, leg straight    R 10x5 seconds for 3 sets    L 10x5 seconds for 3 sets   - Supine Piriformis Stretch  - 3 x daily - 7 x weekly - 1 sets - 5 reps - 30 seconds hold  - Standing Hip Abduction with Counter Support  - 1 x daily - 7 x weekly - 3 sets - 10 reps - 5 second holds hold  Yellow band around knees    ASSESSMENT:  CLINICAL IMPRESSION:  Pt demonstrates overall improved B hip strength, functional mobility, and significant improvement in R low back and hip pain since initial evaluation. Pt making progress with PT towards goals. Pt still demonstrates difficulty with stepping over and clearing an obstacle with SPC, especially when standing on her R LE secondary to weakness. Pt will benefit from continued skilled physical therapy services to continue improving strength, balance and function.       OBJECTIVE IMPAIRMENTS: Abnormal gait, decreased balance, difficulty walking, decreased strength, improper body mechanics, postural dysfunction, and pain.   ACTIVITY LIMITATIONS: carrying, lifting, bending, standing, squatting, stairs, transfers, and locomotion level  PARTICIPATION LIMITATIONS:   PERSONAL FACTORS: Age, Fitness, Time since onset of injury/illness/exacerbation, and 3+ comorbidities: heart failure, A-fib, ascending aorta dilation, osteoarthritis, syncope are also ; affecting patient's functional outcome.   REHAB POTENTIAL: Fair    CLINICAL DECISION MAKING: Stable/uncomplicated  EVALUATION COMPLEXITY: Low   GOALS: Goals reviewed with patient? Yes  SHORT TERM GOALS: Target date:  08/22/2023  Pt will be independent with her initial HEP to decrease pain, improve strength, balance, and function.  Baseline: Pt has not yet started her initial HEP (08/06/2023); No questions, able to perform her HEP (09/12/2023) Goal status: MET    LONG TERM GOALS: Target date: 10/17/2023  Pt will have a decrease in low back pain to 3/10 or less at worst to promote ability to perform standing tasks such as yard work more comfortably.  Baseline: 7/10 low back pain at worst for the past 3 months (08/06/2023); 3/10 at least at most for the past 7 days (09/12/2023); 1-2/10 at worst for the past 7 days (10/06/2023) Goal status: MET   2.  Pt will improve her Modified Oswestry Low Back Pain Disability Questionnaire to 10% or less as a demonstration of improved function.  Baseline: Modified Oswestry 9/50 (18%) (08/06/2023); 12/50 (24%) ( 09/12/2023) Goal status: ONGOING  3.  Pt will improve her B hip extension, ER, and abduction strength by at least 1/2 MMT grade to promote ability to perform closed chain tasks more comfortably for her back.  Baseline:  MMT Right eval Left eval R (09/12/2023) L  (09/12/2023) R (10/06/2023) L (10/06/2023)  Hip extension (seated manually resisted) 3 3+ 4 4 4+ 4  Hip abduction (seated manually resisted) 4 4 4+ 4+ 5 5  Hip external rotation 4 4- 4 4 4 4    (08/06/2023)  Goal status: Potentially met (except R hip ER)     4.  Pt will improver her Timed Up and Go time to 12 seconds or less with her SPC as a demonstration of improved functional mobility  Baseline: 17.8 seconds average  with SPC on L side (08/06/2023); 14.9 seconds averange with SPC on L side (09/12/2023); 16.58 seconds on average with SPC on L side (10/06/2023) Goal status: Progressing.     5.  Pt will improve her Dynamic Gait Index (DGI) Score to 19/24 or more as a demonstration of improved balance.  Baseline: DGI score: 15/24 (10/06/2023) Goal status: INITIAL    PLAN:  PT FREQUENCY: 2x/week  PT DURATION: 2  weeks  PLANNED INTERVENTIONS: 97110-Therapeutic exercises, 97530- Therapeutic activity, 97112- Neuromuscular re-education, 97535- Self Care, 16109- Manual therapy, [phone]- Gait training, [phone]- Canalith repositioning, [id number]- Electrical stimulation (unattended), [phone]- Ionotophoresis 4mg /ml Dexamethasone , Patient/Family education, Balance training, Stair training, Dry Needling, Joint mobilization, and Vestibular training.  PLAN FOR NEXT SESSION: Posture, trunk and hip strength, femoral control, functional strengthening, balance, manual techniques, modalities PRN   Denise Washburn, PT, DPT 10/13/2023, 4:13 PM

## 2023-10-16 ENCOUNTER — Ambulatory Visit

## 2023-10-16 DIAGNOSIS — M6281 Muscle weakness (generalized): Secondary | ICD-10-CM | POA: Diagnosis not present

## 2023-10-16 DIAGNOSIS — R262 Difficulty in walking, not elsewhere classified: Secondary | ICD-10-CM | POA: Diagnosis not present

## 2023-10-16 DIAGNOSIS — M5459 Other low back pain: Secondary | ICD-10-CM | POA: Diagnosis not present

## 2023-10-16 DIAGNOSIS — R2681 Unsteadiness on feet: Secondary | ICD-10-CM | POA: Diagnosis not present

## 2023-10-16 NOTE — Therapy (Signed)
 OUTPATIENT PHYSICAL THERAPY TREATMENT  Patient Name: Deborah Baxter MRN: [id number] DOB:25-May-1934, 89 y.o., female Today's Date: 10/16/2023  END OF SESSION:  PT End of Session - 10/16/23 1436     Visit Number 17    Number of Visits 18    Date for PT Re-Evaluation 11/21/23    Authorization Type Medicare A & B    Progress Note Due on Visit 10    PT Start Time 1436   pt arrived late   PT Stop Time 1516    PT Time Calculation (min) 40 min    Equipment Utilized During Treatment Gait belt    Activity Tolerance Patient tolerated treatment well    Behavior During Therapy WFL for tasks assessed/performed                            Past Medical History:  Diagnosis Date   (HFpEF) heart failure with preserved ejection fraction (HCC) 04/24/2021   Pulmonary raised concern - question of pulmonary hypertension secondary to DD.  Question of need for right heart cath   A-fib [facility]) 04/24/2021   Acute heart failure with preserved ejection fraction (HCC)    Ascending aorta dilatation (HCC) 08/01/2021   Atrial fibrillation with rapid ventricular response (HCC) 03/20/2021   Congestion of nasal sinus 10/13/2021   Cough 04/23/2021   Diverticulosis 09/05/2012   Colonoscopy 08/31/12 - redundant colon and diverticulosis in the sigmoid colon, descending colon, transverse colon and hepatic flexure.  Recommend f/u colonoscopy in 10 years.    Elevated bilirubin 09/13/2021   Elevated blood pressure reading 12/05/2020   Health care maintenance 07/13/2014   Physical 07/12/14.  Colonoscopy 08/31/12.  Normal bone density 04/06/10.  Mammogram 05/30/20- Birads I.     Hx of degenerative disc disease    Hypercholesterolemia    Hypertensive urgency 03/20/2021   Ingrown toenail 09/13/2021   Left hand pain 03/20/2021   Low back pain 01/29/2021   Lumbago 07/13/2014   Lumbar spinal stenosis 06/27/2012   Neuromuscular disorder (HCC)    pinched nerve   Numbness and tingling in left hand 08/21/2017   Osteoarthritis     Pleural effusion 09/13/2021   Pulmonary edema 03/20/2021   Seasonal allergies    Spinal stenosis    chronic back and leg pain   Stress 02/03/2021   Syncope 12/05/2020   Thrombocytopenia (HCC) 09/13/2021   Past Surgical History:  Procedure Laterality Date   APPENDECTOMY     bladder tack surgery  2000   BTL  1976   CARDIOVERSION N/A 05/11/2021   Procedure: CARDIOVERSION;  Surgeon: Devorah Fonder, MD;  Location: [facility] ORS;  Service: Cardiovascular;  Laterality: N/A;   CARPAL TUNNEL RELEASE     CATARACT EXTRACTION W/PHACO Left 12/07/2015   Procedure: CATARACT EXTRACTION PHACO AND INTRAOCULAR LENS PLACEMENT (IOC);  Surgeon: Rosa College, MD;  Location: [facility] ORS;  Service: Ophthalmology;  Laterality: Left;  US  1.23 AP% 23.2 CDE 19.28 Fluid Pack Lot # [id number]   EYE SURGERY  03/19/15   GANGLION CYST EXCISION     right hand   GAS/FLUID EXCHANGE Left 03/08/2015   Procedure: GAS/FLUID EXCHANGE;  Surgeon: Stephenie Einstein, MD;  Location: [facility] ORS;  Service: Ophthalmology;  Laterality: Left;   RIGHT HEART CATH N/A 11/06/2021   Procedure: RIGHT HEART CATH;  Surgeon: Sammy Crisp, MD;  Location: [facility] INVASIVE CV [facility];  Service: Cardiovascular;  Laterality: N/A;   TONSILLECTOMY     TUBAL LIGATION  VITRECTOMY 25 GAUGE WITH SCLERAL BUCKLE Left 03/08/2015   Procedure: VITRECTOMY 25 GAUGE, membrane peel, 26 %  SF6 gas exchange;  Surgeon: Stephenie Einstein, MD;  Location: [facility] ORS;  Service: Ophthalmology;  Laterality: Left;  casette lot # [id number]   Patient Active Problem List   Diagnosis Date Noted   Skin lesion 09/16/2023   Unsteady gait 05/16/2023   Arm pain 08/18/2022   Hx of degenerative disc disease 10/23/2021   Osteoarthritis 10/23/2021   Seasonal allergies 10/23/2021   Spinal stenosis 10/23/2021   Elevated bilirubin 09/13/2021   Thrombocytopenia (HCC) 09/13/2021   Ingrown toenail 09/13/2021   Ascending aorta dilatation (HCC) 08/01/2021   A-fib (HCC) 04/24/2021   (HFpEF) heart  failure with preserved ejection fraction (HCC) 04/24/2021   Acute heart failure with preserved ejection fraction (HCC)    Atrial fibrillation with rapid ventricular response (HCC) 03/20/2021   Left hand pain 03/20/2021   Stress 02/03/2021   Low back pain 01/29/2021   Syncope 12/05/2020   Elevated blood pressure reading 12/05/2020   Numbness and tingling in left hand 08/21/2017   Lumbago 07/13/2014   Health care maintenance 07/13/2014   Diverticulosis 09/05/2012   Hypercholesterolemia 06/27/2012   Lumbar spinal stenosis 06/27/2012    PCP: Deborah Fenton, MD  REFERRING PROVIDER: Dellar Fenton, MD  REFERRING DIAG: R26.81 (ICD-10-CM) - Unsteady [phone] (ICD-10-CM) - Right-sided low back pain with right-sided sciatica, unspecified chronicity  Rationale for Evaluation and Treatment: Rehabilitation  THERAPY DIAG:  Other low back pain - Plan: PT plan of care cert/re-cert  Difficulty in walking, not elsewhere classified - Plan: PT plan of care cert/re-cert  Muscle weakness (generalized) - Plan: PT plan of care cert/re-cert  Unsteadiness on feet - Plan: PT plan of care cert/re-cert  ONSET DATE: 05/16/2023 (date PT referral was signed)  SUBJECTIVE:                                                                                                                                                                                           SUBJECTIVE STATEMENT: 2/10 R low back/hip pain currently. Balance feels like it is improving. Standing still is still difficult.     PERTINENT HISTORY:  R sided low back pain, and unsteady gait. Chronic, which progressively worsened over the years. Has gone to a chiropractor for a while which stopped helping. Has not had back surgery. No osteoporosis to her knowledge. Balance is also not as good, such as when standing. [name] a long time ago 50 years ago which bothered her R low back. Back pain has remained the same since several years ago. Feels R LE  radiating symptoms to  her foot (L5 dermatome on R)   Blood pressure is controlled per pt.   No latex allergies     PAIN:  Are you having pain? Yes: NPRS scale: 4/10 Pain location: R low back/posterior hip Pain description: catching; dull pain Aggravating factors: weather, picking up sticks in the yard (daughter states pt has a very high tolerance for pain) Relieving factors: ice pack, sitting eases back pain, tylenol   PRECAUTIONS: Fall risk  RED FLAGS: Bowel or bladder incontinence: Yes: Bladder, doctor Scott aware. and Cauda equina syndrome: No   WEIGHT BEARING RESTRICTIONS: No  FALLS:  Has patient fallen in last 6 months? No  LIVING ENVIRONMENT: Lives with: lives alone Lives in: House/apartment Stairs: Yes: Internal: about 12 to the basement to do laundry 2x/week steps; L rail going down to the basement and External: 2 steps; on right going up Has following equipment at home: Single point cane and Shower bench  OCCUPATION: retired  PLOF: Requires assistive device for independence  PATIENT GOALS: Be without the pain. Improve balance.   NEXT MD VISIT: Yes but pt does not know. Has a wellness visit.   OBJECTIVE:  Note: Objective measures were completed at Evaluation unless otherwise noted.  DIAGNOSTIC FINDINGS:  DG Lumbar Spine 2-3 Views 01/30/2024  Narrative & Impression  CLINICAL DATA:  Low back pain, radiating down right leg   EXAM: LUMBAR SPINE - 2-3 VIEW   COMPARISON:  2019   FINDINGS: Alignment is similar with levoscoliosis. Diffusely decreased osseous mineralization in combination with overlying bowel gas limits evaluation. Vertebral body heights appear similar. Multilevel disc space narrowing, endplate osteophytes, and facet hypertrophy.   IMPRESSION: Suboptimal evaluation due to demineralization. Multilevel spondylosis superimposed on scoliosis similar to prior study.     Electronically Signed   By: Geannie Keener M.D.   On: 01/30/2021 09:09     PATIENT SURVEYS:  Modified Oswestry 9/50 (18%) (08/06/2023)   COGNITION: Overall cognitive status: Within functional limits for tasks assessed     SENSATION:   MUSCLE LENGTH:    POSTURE: forward neck B protracted shoulders, R shoulder lower, forward flexed, R iliac crest and greater trochanter higher, L lumbar convexity around L3, L knee in slight flexion, R knee genu valgus.   PALPATION: TTP to R posterior hip/piriformis (proximal attachment and muscle belly), TTP around R posterior hip around Sciatic nerve.  LUMBAR ROM:   AROM eval  Flexion WFL with R low back pain  Extension Limited with low back pain  Right lateral flexion WFL  Left lateral flexion WFL  Right rotation [facility] with R low back pain  Left rotation WFL   (Blank rows = not tested)  LOWER EXTREMITY ROM:     Passive  Right (09/05/2023) Left (09/05/2023)  Hip flexion    Hip extension    Hip abduction    Hip adduction    Hip internal rotation WFL Limited with reproduction of R posterior hip pain  Hip external rotation    Knee flexion    Knee extension    Ankle dorsiflexion    Ankle plantarflexion    Ankle inversion    Ankle eversion     (Blank rows = not tested)  LOWER EXTREMITY MMT:    MMT Right eval Left eval  Hip flexion 4- 4-  Hip extension (seated manually resisted) 3 3+  Hip abduction (seated manually resisted) 4 4  Hip adduction    Hip internal rotation    Hip external rotation 4 4-  Knee flexion 4 4  Knee extension 4+ 5  Ankle dorsiflexion    Ankle plantarflexion    Ankle inversion    Ankle eversion     (Blank rows = not tested)  LUMBAR SPECIAL TESTS:    FUNCTIONAL TESTS:  5 times sit to stand: unable to test at this time Timed up and go (TUG): 18.24 seconds; 17.24 s, 17.88 seconds with SPC on L; (17.8 seconds average)  Dynamic Gait Index: unable to test at this time Decreased femoral control R with STS   GAIT: Distance walked: 30 ft Assistive device utilized: Single point  cane Level of assistance: Modified independence Comments: Antalic, decreased stance R LE, SPC on R, R knee genu valgus during stance phase of gait  TREATMENT DATE: 10/13/2023                                                                                                                               Therapeutic Activities  Performed to improve ability to perform standing tasks, transfers more comfortably and with less difficulty.    Directed patient with gait with normal gait speed, with changes in speed, 180 degree pivot turn, with R and L cervical rotation position, with cervical flexion and extension position, stepping around obstacles, stepping over an obstacle, ascending and descending 4 regular steps with UE assist   Seated manually resisted R hip ER 1x  Standing up from a chair, walking 10 ft forward, then returning 10 ft, then sitting back onto chair 3x  Without SPC: 16.51 seconds, 11.99 seconds, 11.6 seconds (13.4 seconds average)  Reviewed progress/current status with PT towards goals.   Improved technique, movement at target joints, use of target muscles after mod verbal, visual, tactile cues.      Neuromuscular re education  Standing with feet shoulder width apart   Eyes closed    Firm surface 10x10 seconds with CGA to SBA     Eyes open   On Air Ex Pad 3x10 seconds. Easy   Eyes closed    On Air Ex Pad 10x5 seconds with B UE light touch assist PRN SBA to min A    Tendency for LOB backwards. Cues for forward weight shifting.     Firm surface, eyes open   Forward weight shifting onto forefeet 10x10 seconds    Improved technique, movement at target joints, use of target muscles after mod verbal, visual, tactile cues.           PATIENT EDUCATION:  Education details: there-ex, HEP Person educated: Patient, Child(ren), and Estate manager/land agent Education method: Explanation, Demonstration, Tactile cues, Verbal cues, and Handouts Education  comprehension: verbalized understanding and returned demonstration  HOME EXERCISE PROGRAM: Access Code: [email or web address]  URL: [email or web address] Date: 08/08/2023 Prepared by: Suzzane Estes  Exercises - Seated Flexion Stretch  - 3 x daily - 7 x weekly - 3 sets - 10 reps - 5 seconds hold - Seated Transversus Abdominis Bracing  - 3 x daily - 7 x weekly -  3 sets - 10 reps - 5 seconds hold - Seated Gluteal Sets  - 3 x daily - 7 x weekly - 3 sets - 10 reps - 5 seconds hold  - Hooklying Clamshell with Resistance  - 1 x daily - 7 x weekly - 6 sets - 5 reps  Yellow band  Hooklying Hip extension isometrics, leg straight    R 10x5 seconds for 3 sets    L 10x5 seconds for 3 sets   - Supine Piriformis Stretch  - 3 x daily - 7 x weekly - 1 sets - 5 reps - 30 seconds hold  - Standing Hip Abduction with Counter Support  - 1 x daily - 7 x weekly - 3 sets - 10 reps - 5 second holds hold  Yellow band around knees    ASSESSMENT:  CLINICAL IMPRESSION:  Pt demonstrates overall improved B hip strength, functional mobility,  R low back and hip pain and balance since initial evaluation. Pt making progress with PT towards goals. Worked on static balance with eyes open, eyes closed and standing on firm and softer surfaces secondary to reports of difficulty. Better able to perform static balance when cued to place and maintain her center of gravity over her base of support as well as forward weight shifting (when eyes are closed secondary to backwards lean compensation with loss of balance). Pt tolerated session well without aggravation of symptoms. Pt will benefit from continued skilled physical therapy services to continue improving strength, balance and function.       OBJECTIVE IMPAIRMENTS: Abnormal gait, decreased balance, difficulty walking, decreased strength, improper body mechanics, postural dysfunction, and pain.   ACTIVITY LIMITATIONS: carrying, lifting, bending, standing, squatting,  stairs, transfers, and locomotion level  PARTICIPATION LIMITATIONS:   PERSONAL FACTORS: Age, Fitness, Time since onset of injury/illness/exacerbation, and 3+ comorbidities: heart failure, A-fib, ascending aorta dilation, osteoarthritis, syncope are also ; affecting patient's functional outcome.   REHAB POTENTIAL: Fair    CLINICAL DECISION MAKING: Stable/uncomplicated  EVALUATION COMPLEXITY: Low   GOALS: Goals reviewed with patient? Yes  SHORT TERM GOALS: Target date: 08/22/2023  Pt will be independent with her initial HEP to decrease pain, improve strength, balance, and function.  Baseline: Pt has not yet started her initial HEP (08/06/2023); No questions, able to perform her HEP (09/12/2023) Goal status: MET    LONG TERM GOALS: Target date: 11/21/2023  Pt will have a decrease in low back pain to 3/10 or less at worst to promote ability to perform standing tasks such as yard work more comfortably.  Baseline: 7/10 low back pain at worst for the past 3 months (08/06/2023); 3/10 at least at most for the past 7 days (09/12/2023); 1-2/10 at worst for the past 7 days (10/06/2023) Goal status: MET   2.  Pt will improve her Modified Oswestry Low Back Pain Disability Questionnaire to 10% or less as a demonstration of improved function.  Baseline: Modified Oswestry 9/50 (18%) (08/06/2023); 12/50 (24%) ( 09/12/2023); 11/50 (22%) (10/16/2023) Goal status: ONGOING  3.  Pt will improve her B hip extension, ER, and abduction strength by at least 1/2 MMT grade to promote ability to perform closed chain tasks more comfortably for her back.  Baseline:  MMT Right eval Left eval R (09/12/2023) L  (09/12/2023) R (10/06/2023) L (10/06/2023)  Hip extension (seated manually resisted) 3 3+ 4 4 4+ 4  Hip abduction (seated manually resisted) 4 4 4+ 4+ 5 5  Hip external rotation 4 4- 4 4 4  4   (08/06/2023)   MMT Right (10/16/2023) Left (10/16/2023)  Hip extension (seated manually resisted)    Hip abduction (seated  manually resisted)    Hip external rotation 4+      Goal status: MET    4.  Pt will improver her Timed Up and Go time to 12 seconds or less with her [facility] as a demonstration of improved functional mobility  Baseline: 17.8 seconds average with SPC on L side (08/06/2023); 14.9 seconds averange with SPC on L side (09/12/2023); 16.58 seconds on average with SPC on L side (10/06/2023); 13.4 seconds average without the SPC (10/16/2023) Goal status: Progressing.   5.  Pt will improve her Dynamic Gait Index (DGI) Score to 19/24 or more as a demonstration of improved balance.  Baseline: DGI score: 15/24 (10/06/2023); 17/24 (10/16/2023) Goal status: IN PROGRESS    PLAN:  PT FREQUENCY: 2x/week  PT DURATION: other: 5 weeks  PLANNED INTERVENTIONS: 97110-Therapeutic exercises, 97530- Therapeutic activity, 97112- Neuromuscular re-education, 97535- Self Care, 16010- Manual therapy, [phone]- Gait training, [phone]- Canalith repositioning, [id number]- Electrical stimulation (unattended), [phone]- Ionotophoresis 4mg /ml Dexamethasone , Patient/Family education, Balance training, Stair training, Dry Needling, Joint mobilization, and Vestibular training.  PLAN FOR NEXT SESSION: Posture, trunk and hip strength, femoral control, functional strengthening, balance, manual techniques, modalities PRN   Kerstyn Coryell, PT, DPT 10/16/2023, 3:39 PM

## 2023-10-22 ENCOUNTER — Ambulatory Visit

## 2023-10-22 DIAGNOSIS — M6281 Muscle weakness (generalized): Secondary | ICD-10-CM

## 2023-10-22 DIAGNOSIS — R262 Difficulty in walking, not elsewhere classified: Secondary | ICD-10-CM | POA: Diagnosis not present

## 2023-10-22 DIAGNOSIS — M5459 Other low back pain: Secondary | ICD-10-CM | POA: Diagnosis not present

## 2023-10-22 DIAGNOSIS — R2681 Unsteadiness on feet: Secondary | ICD-10-CM | POA: Diagnosis not present

## 2023-10-22 NOTE — Therapy (Signed)
 OUTPATIENT PHYSICAL THERAPY TREATMENT  Patient Name: Deborah Baxter MRN: [id number] DOB:[birth year], 89 y.o., female Today's Date: 10/22/2023  END OF SESSION:  PT End of Session - 10/22/23 1304     Visit Number 18    Number of Visits --    Date for PT Re-Evaluation 11/21/23    Authorization Type Medicare A & B    Progress Note Due on Visit 10    PT Start Time 1304    PT Stop Time 1342    PT Time Calculation (min) 38 min    Equipment Utilized During Treatment Gait belt    Activity Tolerance Patient tolerated treatment well    Behavior During Therapy WFL for tasks assessed/performed                             Past Medical History:  Diagnosis Date   (HFpEF) heart failure with preserved ejection fraction (HCC) 04/24/2021   Pulmonary raised concern - question of pulmonary hypertension secondary to DD.  Question of need for right heart cath   A-fib [facility]) 04/24/2021   Acute heart failure with preserved ejection fraction (HCC)    Ascending aorta dilatation (HCC) 08/01/2021   Atrial fibrillation with rapid ventricular response (HCC) 03/20/2021   Congestion of nasal sinus 10/13/2021   Cough 04/23/2021   Diverticulosis 09/05/2012   Colonoscopy 08/31/12 - redundant colon and diverticulosis in the sigmoid colon, descending colon, transverse colon and hepatic flexure.  Recommend f/u colonoscopy in 10 years.    Elevated bilirubin 09/13/2021   Elevated blood pressure reading 12/05/2020   Health care maintenance 07/13/2014   Physical 07/12/14.  Colonoscopy 08/31/12.  Normal bone density 04/06/10.  Mammogram 05/30/20- Birads I.     Hx of degenerative disc disease    Hypercholesterolemia    Hypertensive urgency 03/20/2021   Ingrown toenail 09/13/2021   Left hand pain 03/20/2021   Low back pain 01/29/2021   Lumbago 07/13/2014   Lumbar spinal stenosis 06/27/2012   Neuromuscular disorder (HCC)    pinched nerve   Numbness and tingling in left hand 08/21/2017   Osteoarthritis    Pleural  effusion 09/13/2021   Pulmonary edema 03/20/2021   Seasonal allergies    Spinal stenosis    chronic back and leg pain   Stress 02/03/2021   Syncope 12/05/2020   Thrombocytopenia (HCC) 09/13/2021   Past Surgical History:  Procedure Laterality Date   APPENDECTOMY     bladder tack surgery  2000   BTL  1976   CARDIOVERSION N/A 05/11/2021   Procedure: CARDIOVERSION;  Surgeon: Devorah Fonder, MD;  Location: [facility] ORS;  Service: Cardiovascular;  Laterality: N/A;   CARPAL TUNNEL RELEASE     CATARACT EXTRACTION W/PHACO Left 12/07/2015   Procedure: CATARACT EXTRACTION PHACO AND INTRAOCULAR LENS PLACEMENT (IOC);  Surgeon: Rosa College, MD;  Location: [facility] ORS;  Service: Ophthalmology;  Laterality: Left;  US  1.23 AP% 23.2 CDE 19.28 Fluid Pack Lot # [id number]   EYE SURGERY  03/19/15   GANGLION CYST EXCISION     right hand   GAS/FLUID EXCHANGE Left 03/08/2015   Procedure: GAS/FLUID EXCHANGE;  Surgeon: Stephenie Einstein, MD;  Location: [facility] ORS;  Service: Ophthalmology;  Laterality: Left;   RIGHT HEART CATH N/A 11/06/2021   Procedure: RIGHT HEART CATH;  Surgeon: Sammy Crisp, MD;  Location: [facility] INVASIVE CV LAB;  Service: Cardiovascular;  Laterality: N/A;   TONSILLECTOMY     TUBAL LIGATION     VITRECTOMY  25 GAUGE WITH SCLERAL BUCKLE Left 03/08/2015   Procedure: VITRECTOMY 25 GAUGE, membrane peel, 26 %  SF6 gas exchange;  Surgeon: Stephenie Einstein, MD;  Location: [facility] ORS;  Service: Ophthalmology;  Laterality: Left;  casette lot # [id number]   Patient Active Problem List   Diagnosis Date Noted   Skin lesion 09/16/2023   Unsteady gait 05/16/2023   Arm pain 08/18/2022   Hx of degenerative disc disease 10/23/2021   Osteoarthritis 10/23/2021   Seasonal allergies 10/23/2021   Spinal stenosis 10/23/2021   Elevated bilirubin 09/13/2021   Thrombocytopenia (HCC) 09/13/2021   Ingrown toenail 09/13/2021   Ascending aorta dilatation (HCC) 08/01/2021   A-fib (HCC) 04/24/2021   (HFpEF) heart failure with  preserved ejection fraction (HCC) 04/24/2021   Acute heart failure with preserved ejection fraction [facility])    Atrial fibrillation with rapid ventricular response (HCC) 03/20/2021   Left hand pain 03/20/2021   Stress 02/03/2021   Low back pain 01/29/2021   Syncope 12/05/2020   Elevated blood pressure reading 12/05/2020   Numbness and tingling in left hand 08/21/2017   Lumbago 07/13/2014   Health care maintenance 07/13/2014   Diverticulosis 09/05/2012   Hypercholesterolemia 06/27/2012   Lumbar spinal stenosis 06/27/2012    PCP: Dellar Fenton, MD  REFERRING PROVIDER: Dellar Fenton, MD  REFERRING DIAG: R26.81 (ICD-10-CM) - Unsteady [phone] (ICD-10-CM) - Right-sided low back pain with right-sided sciatica, unspecified chronicity  Rationale for Evaluation and Treatment: Rehabilitation  THERAPY DIAG:  Other low back pain  Difficulty in walking, not elsewhere classified  Muscle weakness (generalized)  Unsteadiness on feet  ONSET DATE: 05/16/2023 (date PT referral was signed)  SUBJECTIVE:                                                                                                                                                                                           SUBJECTIVE STATEMENT: Balance is ok. The weather does not help her R low back/hip, the arthritis is kicking in.      PERTINENT HISTORY:  R sided low back pain, and unsteady gait. Chronic, which progressively worsened over the years. Has gone to a chiropractor for a while which stopped helping. Has not had back surgery. No osteoporosis to her knowledge. Balance is also not as good, such as when standing. [name] a long time ago 50 years ago which bothered her R low back. Back pain has remained the same since several years ago. Feels R LE radiating symptoms to her foot (L5 dermatome on R)   Blood pressure is controlled per pt.   No latex allergies     PAIN:  Are you having pain?  Yes: NPRS scale:  4/10 Pain location: R low back/posterior hip Pain description: catching; dull pain Aggravating factors: weather, picking up sticks in the yard (daughter states pt has a very high tolerance for pain) Relieving factors: ice pack, sitting eases back pain, tylenol   PRECAUTIONS: Fall risk  RED FLAGS: Bowel or bladder incontinence: Yes: Bladder, doctor Scott aware. and Cauda equina syndrome: No   WEIGHT BEARING RESTRICTIONS: No  FALLS:  Has patient fallen in last 6 months? No  LIVING ENVIRONMENT: Lives with: lives alone Lives in: House/apartment Stairs: Yes: Internal: about 12 to the basement to do laundry 2x/week steps; L rail going down to the basement and External: 2 steps; on right going up Has following equipment at home: Single point cane and Shower bench  OCCUPATION: retired  PLOF: Requires assistive device for independence  PATIENT GOALS: Be without the pain. Improve balance.   NEXT MD VISIT: Yes but pt does not know. Has a wellness visit.   OBJECTIVE:  Note: Objective measures were completed at Evaluation unless otherwise noted.  DIAGNOSTIC FINDINGS:  DG Lumbar Spine 2-3 Views 01/30/2024  Narrative & Impression  CLINICAL DATA:  Low back pain, radiating down right leg   EXAM: LUMBAR SPINE - 2-3 VIEW   COMPARISON:  2019   FINDINGS: Alignment is similar with levoscoliosis. Diffusely decreased osseous mineralization in combination with overlying bowel gas limits evaluation. Vertebral body heights appear similar. Multilevel disc space narrowing, endplate osteophytes, and facet hypertrophy.   IMPRESSION: Suboptimal evaluation due to demineralization. Multilevel spondylosis superimposed on scoliosis similar to prior study.     Electronically Signed   By: Geannie Keener M.D.   On: 01/30/2021 09:09    PATIENT SURVEYS:  Modified Oswestry 9/50 (18%) (08/06/2023)   COGNITION: Overall cognitive status: Within functional limits for tasks  assessed     SENSATION:   MUSCLE LENGTH:    POSTURE: forward neck B protracted shoulders, R shoulder lower, forward flexed, R iliac crest and greater trochanter higher, L lumbar convexity around L3, L knee in slight flexion, R knee genu valgus.   PALPATION: TTP to R posterior hip/piriformis (proximal attachment and muscle belly), TTP around R posterior hip around Sciatic nerve.  LUMBAR ROM:   AROM eval  Flexion WFL with R low back pain  Extension Limited with low back pain  Right lateral flexion WFL  Left lateral flexion WFL  Right rotation [facility] with R low back pain  Left rotation WFL   (Blank rows = not tested)  LOWER EXTREMITY ROM:     Passive  Right (09/05/2023) Left (09/05/2023)  Hip flexion    Hip extension    Hip abduction    Hip adduction    Hip internal rotation WFL Limited with reproduction of R posterior hip pain  Hip external rotation    Knee flexion    Knee extension    Ankle dorsiflexion    Ankle plantarflexion    Ankle inversion    Ankle eversion     (Blank rows = not tested)  LOWER EXTREMITY MMT:    MMT Right eval Left eval  Hip flexion 4- 4-  Hip extension (seated manually resisted) 3 3+  Hip abduction (seated manually resisted) 4 4  Hip adduction    Hip internal rotation    Hip external rotation 4 4-  Knee flexion 4 4  Knee extension 4+ 5  Ankle dorsiflexion    Ankle plantarflexion    Ankle inversion    Ankle eversion     (Blank  rows = not tested)  LUMBAR SPECIAL TESTS:    FUNCTIONAL TESTS:  5 times sit to stand: unable to test at this time Timed up and go (TUG): 18.24 seconds; 17.24 s, 17.88 seconds with SPC on L; (17.8 seconds average)  Dynamic Gait Index: unable to test at this time Decreased femoral control R with STS   GAIT: Distance walked: 30 ft Assistive device utilized: Single point cane Level of assistance: Modified independence Comments: Antalic, decreased stance R LE, SPC on R, R knee genu valgus during stance phase  of gait  TREATMENT DATE: 10/13/2023                                                                                                                                  Neuromuscular re education  Standing with feet shoulder width apart   Eyes closed    Firm surface 10x10 seconds for 2 sets with CGA to SBA   Some unsteadiness      Eyes closed    On Air Ex Pad 10x5 seconds for 2 sets with B UE light touch assist PRN SBA to min A     Cues for maintaining her center of gravity over her base of support.    Firm surface, eyes open   Forward weight shifting onto forefeet 10x10 seconds    Tandem walking 10 ft forward CGA to min A 4x without AD  Cues to place and maintain her center of gravity over her stance foot/feet   Stepping over 4 mini hurdles without AD CGA to min A 2x2  Cues to place and maintain her center of gravity over her stance foot, especially when R LE weight bearing.    SLS on R LE with B UE  assist 10x5 seconds      Improved technique, movement at target joints, use of target muscles after mod verbal, visual, tactile cues.           PATIENT EDUCATION:  Education details: there-ex, HEP Person educated: Patient, Child(ren), and Estate manager/land agent Education method: Explanation, Demonstration, Tactile cues, Verbal cues, and Handouts Education comprehension: verbalized understanding and returned demonstration  HOME EXERCISE PROGRAM: Access Code: [email or web address]  URL: [email or web address] Date: 08/08/2023 Prepared by: Suzzane Estes  Exercises - Seated Flexion Stretch  - 3 x daily - 7 x weekly - 3 sets - 10 reps - 5 seconds hold - Seated Transversus Abdominis Bracing  - 3 x daily - 7 x weekly - 3 sets - 10 reps - 5 seconds hold - Seated Gluteal Sets  - 3 x daily - 7 x weekly - 3 sets - 10 reps - 5 seconds hold  - Hooklying Clamshell with Resistance  - 1 x daily - 7 x weekly - 6 sets - 5 reps  Yellow band  Hooklying Hip extension  isometrics, leg straight    R 10x5 seconds for 3 sets    L 10x5 seconds for 3 sets   -  Supine Piriformis Stretch  - 3 x daily - 7 x weekly - 1 sets - 5 reps - 30 seconds hold  - Standing Hip Abduction with Counter Support  - 1 x daily - 7 x weekly - 3 sets - 10 reps - 5 second holds hold  Yellow band around knees    ASSESSMENT:  CLINICAL IMPRESSION: Continued working on improving balance, eyes open, eyes closed, firm and soft surface and with decrease base of support to decrease fall risk. Improved balance observed when cued to maintain her center of gravity over her base of support. Pt tolerated session well without aggravation of symptoms. Pt will benefit from continued skilled physical therapy services to continue improving strength, balance and function.       OBJECTIVE IMPAIRMENTS: Abnormal gait, decreased balance, difficulty walking, decreased strength, improper body mechanics, postural dysfunction, and pain.   ACTIVITY LIMITATIONS: carrying, lifting, bending, standing, squatting, stairs, transfers, and locomotion level  PARTICIPATION LIMITATIONS:   PERSONAL FACTORS: Age, Fitness, Time since onset of injury/illness/exacerbation, and 3+ comorbidities: heart failure, A-fib, ascending aorta dilation, osteoarthritis, syncope are also ; affecting patient's functional outcome.   REHAB POTENTIAL: Fair    CLINICAL DECISION MAKING: Stable/uncomplicated  EVALUATION COMPLEXITY: Low   GOALS: Goals reviewed with patient? Yes  SHORT TERM GOALS: Target date: 08/22/2023  Pt will be independent with her initial HEP to decrease pain, improve strength, balance, and function.  Baseline: Pt has not yet started her initial HEP (08/06/2023); No questions, able to perform her HEP (09/12/2023) Goal status: MET    LONG TERM GOALS: Target date: 11/21/2023  Pt will have a decrease in low back pain to 3/10 or less at worst to promote ability to perform standing tasks such as yard work more  comfortably.  Baseline: 7/10 low back pain at worst for the past 3 months (08/06/2023); 3/10 at least at most for the past 7 days (09/12/2023); 1-2/10 at worst for the past 7 days (10/06/2023) Goal status: MET   2.  Pt will improve her Modified Oswestry Low Back Pain Disability Questionnaire to 10% or less as a demonstration of improved function.  Baseline: Modified Oswestry 9/50 (18%) (08/06/2023); 12/50 (24%) ( 09/12/2023); 11/50 (22%) (10/16/2023) Goal status: ONGOING  3.  Pt will improve her B hip extension, ER, and abduction strength by at least 1/2 MMT grade to promote ability to perform closed chain tasks more comfortably for her back.  Baseline:  MMT Right eval Left eval R (09/12/2023) L  (09/12/2023) R (10/06/2023) L (10/06/2023)  Hip extension (seated manually resisted) 3 3+ 4 4 4+ 4  Hip abduction (seated manually resisted) 4 4 4+ 4+ 5 5  Hip external rotation 4 4- 4 4 4 4    (08/06/2023)   MMT Right (10/16/2023) Left (10/16/2023)  Hip extension (seated manually resisted)    Hip abduction (seated manually resisted)    Hip external rotation 4+      Goal status: MET    4.  Pt will improver her Timed Up and Go time to 12 seconds or less with her [facility] as a demonstration of improved functional mobility  Baseline: 17.8 seconds average with SPC on L side (08/06/2023); 14.9 seconds averange with SPC on L side (09/12/2023); 16.58 seconds on average with SPC on L side (10/06/2023); 13.4 seconds average without the SPC (10/16/2023) Goal status: Progressing.   5.  Pt will improve her Dynamic Gait Index (DGI) Score to 19/24 or more as a demonstration of improved balance.  Baseline: DGI  score: 15/24 (10/06/2023); 17/24 (10/16/2023) Goal status: IN PROGRESS    PLAN:  PT FREQUENCY: 2x/week  PT DURATION: other: 5 weeks  PLANNED INTERVENTIONS: 97110-Therapeutic exercises, 97530- Therapeutic activity, 97112- Neuromuscular re-education, 97535- Self Care, [id number]- Manual therapy, [phone]- Gait training,  [phone]- Canalith repositioning, [id number]- Electrical stimulation (unattended), [phone]- Ionotophoresis 4mg /ml Dexamethasone , Patient/Family education, Balance training, Stair training, Dry Needling, Joint mobilization, and Vestibular training.  PLAN FOR NEXT SESSION: Posture, trunk and hip strength, femoral control, functional strengthening, balance, manual techniques, modalities PRN   Thaddaeus Granja, PT, DPT 10/22/2023, 1:47 PM

## 2023-11-04 ENCOUNTER — Ambulatory Visit (INDEPENDENT_AMBULATORY_CARE_PROVIDER_SITE_OTHER): Payer: Medicare Other | Admitting: Podiatry

## 2023-11-04 DIAGNOSIS — M79674 Pain in right toe(s): Secondary | ICD-10-CM

## 2023-11-04 DIAGNOSIS — B351 Tinea unguium: Secondary | ICD-10-CM | POA: Diagnosis not present

## 2023-11-04 DIAGNOSIS — M79675 Pain in left toe(s): Secondary | ICD-10-CM | POA: Diagnosis not present

## 2023-11-04 NOTE — Progress Notes (Signed)
  Subjective:  Patient ID: Deborah Baxter, female    DOB: 05/31/1934,  MRN: [id number]  Chief Complaint  Patient presents with   Nail Problem    Nail trim    89 y.o. female returns for the above complaint.  Patient presents with thickened elongated dystrophic toenails x10.  Mild pain on palpation.  Patient states that she is not able to do it herself.  She would like for me to debride down.  She denies any other acute complaints  Objective:  There were no vitals filed for this visit. Podiatric Exam: Vascular: dorsalis pedis and posterior tibial pulses are palpable bilateral. Capillary return is immediate. Temperature gradient is WNL. Skin turgor WNL  Sensorium: Normal Semmes Weinstein monofilament test. Normal tactile sensation bilaterally. Nail Exam: Pt has thick disfigured discolored nails with subungual debris noted bilateral entire nail hallux through fifth toenails.  Pain on palpation to the nails. Ulcer Exam: There is no evidence of ulcer or pre-ulcerative changes or infection. Orthopedic Exam: Muscle tone and strength are WNL. No limitations in general ROM. No crepitus or effusions noted.  Skin: No Porokeratosis. No infection or ulcers    Assessment & Plan:   1. Pain due to onychomycosis of toenails of both feet         Patient was evaluated and treated and all questions answered.  Onychomycosis with pain  -Nails palliatively debrided as below. -Educated on self-care  Procedure: Nail Debridement Rationale: pain  Type of Debridement: manual, sharp debridement. Instrumentation: Nail nipper, rotary burr. Number of Nails: 10  Procedures and Treatment: Consent by patient was obtained for treatment procedures. The patient understood the discussion of treatment and procedures well. All questions were answered thoroughly reviewed. Debridement of mycotic and hypertrophic toenails, 1 through 5 bilateral and clearing of subungual debris. No ulceration, no infection noted.  Return  Visit-Office Procedure: Patient instructed to return to the office for a follow up visit 3 months for continued evaluation and treatment.  Tinnie Forehand, DPM    No follow-ups on file.

## 2023-11-05 ENCOUNTER — Ambulatory Visit: Attending: Internal Medicine

## 2023-11-05 DIAGNOSIS — R262 Difficulty in walking, not elsewhere classified: Secondary | ICD-10-CM | POA: Insufficient documentation

## 2023-11-05 DIAGNOSIS — M5459 Other low back pain: Secondary | ICD-10-CM | POA: Diagnosis not present

## 2023-11-05 DIAGNOSIS — M6281 Muscle weakness (generalized): Secondary | ICD-10-CM | POA: Insufficient documentation

## 2023-11-05 DIAGNOSIS — R2681 Unsteadiness on feet: Secondary | ICD-10-CM | POA: Insufficient documentation

## 2023-11-05 NOTE — Therapy (Signed)
 OUTPATIENT PHYSICAL THERAPY TREATMENT  Patient Name: Deborah Baxter MRN: [id number] DOB:Oct 09, 1933, 89 y.o., female Today's Date: 11/05/2023  END OF SESSION:  PT End of Session - 11/05/23 1511     Visit Number 19    Date for PT Re-Evaluation 11/21/23    Authorization Type Medicare A & B    Progress Note Due on Visit 10    PT Start Time 1512    PT Stop Time 1555    PT Time Calculation (min) 43 min    Equipment Utilized During Treatment Gait belt    Activity Tolerance Patient tolerated treatment well    Behavior During Therapy WFL for tasks assessed/performed                              Past Medical History:  Diagnosis Date   (HFpEF) heart failure with preserved ejection fraction (HCC) 04/24/2021   Pulmonary raised concern - question of pulmonary hypertension secondary to DD.  Question of need for right heart cath   A-fib [facility]) 04/24/2021   Acute heart failure with preserved ejection fraction (HCC)    Ascending aorta dilatation (HCC) 08/01/2021   Atrial fibrillation with rapid ventricular response (HCC) 03/20/2021   Congestion of nasal sinus 10/13/2021   Cough 04/23/2021   Diverticulosis 09/05/2012   Colonoscopy 08/31/12 - redundant colon and diverticulosis in the sigmoid colon, descending colon, transverse colon and hepatic flexure.  Recommend f/u colonoscopy in 10 years.    Elevated bilirubin 09/13/2021   Elevated blood pressure reading 12/05/2020   Health care maintenance 07/13/2014   Physical 07/12/14.  Colonoscopy 08/31/12.  Normal bone density 04/06/10.  Mammogram 05/30/20- Birads I.     Hx of degenerative disc disease    Hypercholesterolemia    Hypertensive urgency 03/20/2021   Ingrown toenail 09/13/2021   Left hand pain 03/20/2021   Low back pain 01/29/2021   Lumbago 07/13/2014   Lumbar spinal stenosis 06/27/2012   Neuromuscular disorder (HCC)    pinched nerve   Numbness and tingling in left hand 08/21/2017   Osteoarthritis    Pleural effusion 09/13/2021    Pulmonary edema 03/20/2021   Seasonal allergies    Spinal stenosis    chronic back and leg pain   Stress 02/03/2021   Syncope 12/05/2020   Thrombocytopenia (HCC) 09/13/2021   Past Surgical History:  Procedure Laterality Date   APPENDECTOMY     bladder tack surgery  2000   BTL  1976   CARDIOVERSION N/A 05/11/2021   Procedure: CARDIOVERSION;  Surgeon: Devorah Fonder, MD;  Location: [facility] ORS;  Service: Cardiovascular;  Laterality: N/A;   CARPAL TUNNEL RELEASE     CATARACT EXTRACTION W/PHACO Left 12/07/2015   Procedure: CATARACT EXTRACTION PHACO AND INTRAOCULAR LENS PLACEMENT (IOC);  Surgeon: Rosa College, MD;  Location: [facility] ORS;  Service: Ophthalmology;  Laterality: Left;  US  1.23 AP% 23.2 CDE 19.28 Fluid Pack Lot # [id number]   EYE SURGERY  03/19/15   GANGLION CYST EXCISION     right hand   GAS/FLUID EXCHANGE Left 03/08/2015   Procedure: GAS/FLUID EXCHANGE;  Surgeon: Stephenie Einstein, MD;  Location: [facility] ORS;  Service: Ophthalmology;  Laterality: Left;   RIGHT HEART CATH N/A 11/06/2021   Procedure: RIGHT HEART CATH;  Surgeon: Sammy Crisp, MD;  Location: [facility] INVASIVE CV LAB;  Service: Cardiovascular;  Laterality: N/A;   TONSILLECTOMY     TUBAL LIGATION     VITRECTOMY 25 GAUGE WITH SCLERAL BUCKLE Left  03/08/2015   Procedure: VITRECTOMY 25 GAUGE, membrane peel, 26 %  SF6 gas exchange;  Surgeon: Stephenie Einstein, MD;  Location: [facility] ORS;  Service: Ophthalmology;  Laterality: Left;  casette lot # [id number]   Patient Active Problem List   Diagnosis Date Noted   Skin lesion 09/16/2023   Unsteady gait 05/16/2023   Arm pain 08/18/2022   Hx of degenerative disc disease 10/23/2021   Osteoarthritis 10/23/2021   Seasonal allergies 10/23/2021   Spinal stenosis 10/23/2021   Elevated bilirubin 09/13/2021   Thrombocytopenia (HCC) 09/13/2021   Ingrown toenail 09/13/2021   Ascending aorta dilatation (HCC) 08/01/2021   A-fib (HCC) 04/24/2021   (HFpEF) heart failure with preserved ejection  fraction (HCC) 04/24/2021   Acute heart failure with preserved ejection fraction [facility])    Atrial fibrillation with rapid ventricular response (HCC) 03/20/2021   Left hand pain 03/20/2021   Stress 02/03/2021   Low back pain 01/29/2021   Syncope 12/05/2020   Elevated blood pressure reading 12/05/2020   Numbness and tingling in left hand 08/21/2017   Lumbago 07/13/2014   Health care maintenance 07/13/2014   Diverticulosis 09/05/2012   Hypercholesterolemia 06/27/2012   Lumbar spinal stenosis 06/27/2012    PCP: Dellar Fenton, MD  REFERRING PROVIDER: Dellar Fenton, MD  REFERRING DIAG: R26.81 (ICD-10-CM) - Unsteady [phone] (ICD-10-CM) - Right-sided low back pain with right-sided sciatica, unspecified chronicity  Rationale for Evaluation and Treatment: Rehabilitation  THERAPY DIAG:  Other low back pain  Difficulty in walking, not elsewhere classified  Muscle weakness (generalized)  Unsteadiness on feet  ONSET DATE: 05/16/2023 (date PT referral was signed)  SUBJECTIVE:                                                                                                                                                                                           SUBJECTIVE STATEMENT: Doing fine. Had a hard week last week due to the weather.      PERTINENT HISTORY:  R sided low back pain, and unsteady gait. Chronic, which progressively worsened over the years. Has gone to a chiropractor for a while which stopped helping. Has not had back surgery. No osteoporosis to her knowledge. Balance is also not as good, such as when standing. [name] a long time ago 50 years ago which bothered her R low back. Back pain has remained the same since several years ago. Feels R LE radiating symptoms to her foot (L5 dermatome on R)   Blood pressure is controlled per pt.   No latex allergies     PAIN:  Are you having pain? Yes: NPRS scale: 4/10 Pain location: R low back/posterior hip Pain  description: catching; dull pain Aggravating factors: weather, picking up sticks in the yard (daughter states pt has a very high tolerance for pain) Relieving factors: ice pack, sitting eases back pain, tylenol   PRECAUTIONS: Fall risk  RED FLAGS: Bowel or bladder incontinence: Yes: Bladder, doctor Scott aware. and Cauda equina syndrome: No   WEIGHT BEARING RESTRICTIONS: No  FALLS:  Has patient fallen in last 6 months? No  LIVING ENVIRONMENT: Lives with: lives alone Lives in: House/apartment Stairs: Yes: Internal: about 12 to the basement to do laundry 2x/week steps; L rail going down to the basement and External: 2 steps; on right going up Has following equipment at home: Single point cane and Shower bench  OCCUPATION: retired  PLOF: Requires assistive device for independence  PATIENT GOALS: Be without the pain. Improve balance.   NEXT MD VISIT: Yes but pt does not know. Has a wellness visit.   OBJECTIVE:  Note: Objective measures were completed at Evaluation unless otherwise noted.  DIAGNOSTIC FINDINGS:  DG Lumbar Spine 2-3 Views 01/30/2024  Narrative & Impression  CLINICAL DATA:  Low back pain, radiating down right leg   EXAM: LUMBAR SPINE - 2-3 VIEW   COMPARISON:  2019   FINDINGS: Alignment is similar with levoscoliosis. Diffusely decreased osseous mineralization in combination with overlying bowel gas limits evaluation. Vertebral body heights appear similar. Multilevel disc space narrowing, endplate osteophytes, and facet hypertrophy.   IMPRESSION: Suboptimal evaluation due to demineralization. Multilevel spondylosis superimposed on scoliosis similar to prior study.     Electronically Signed   By: Geannie Keener M.D.   On: 01/30/2021 09:09    PATIENT SURVEYS:  Modified Oswestry 9/50 (18%) (08/06/2023)   COGNITION: Overall cognitive status: Within functional limits for tasks assessed     SENSATION:   MUSCLE LENGTH:    POSTURE: forward neck B  protracted shoulders, R shoulder lower, forward flexed, R iliac crest and greater trochanter higher, L lumbar convexity around L3, L knee in slight flexion, R knee genu valgus.   PALPATION: TTP to R posterior hip/piriformis (proximal attachment and muscle belly), TTP around R posterior hip around Sciatic nerve.  LUMBAR ROM:   AROM eval  Flexion WFL with R low back pain  Extension Limited with low back pain  Right lateral flexion WFL  Left lateral flexion WFL  Right rotation [facility] with R low back pain  Left rotation WFL   (Blank rows = not tested)  LOWER EXTREMITY ROM:     Passive  Right (09/05/2023) Left (09/05/2023)  Hip flexion    Hip extension    Hip abduction    Hip adduction    Hip internal rotation WFL Limited with reproduction of R posterior hip pain  Hip external rotation    Knee flexion    Knee extension    Ankle dorsiflexion    Ankle plantarflexion    Ankle inversion    Ankle eversion     (Blank rows = not tested)  LOWER EXTREMITY MMT:    MMT Right eval Left eval  Hip flexion 4- 4-  Hip extension (seated manually resisted) 3 3+  Hip abduction (seated manually resisted) 4 4  Hip adduction    Hip internal rotation    Hip external rotation 4 4-  Knee flexion 4 4  Knee extension 4+ 5  Ankle dorsiflexion    Ankle plantarflexion    Ankle inversion    Ankle eversion     (Blank rows = not tested)  LUMBAR SPECIAL TESTS:    FUNCTIONAL  TESTS:  5 times sit to stand: unable to test at this time Timed up and go (TUG): 18.24 seconds; 17.24 s, 17.88 seconds with SPC on L; (17.8 seconds average)  Dynamic Gait Index: unable to test at this time Decreased femoral control R with STS   GAIT: Distance walked: 30 ft Assistive device utilized: Single point cane Level of assistance: Modified independence Comments: Antalic, decreased stance R LE, SPC on R, R knee genu valgus during stance phase of gait  TREATMENT DATE: 11/05/2023                                                                                                                                   Neuromuscular re education  Tandem walking 10 ft forward CGA to min A 4x without AD  Cues to place and maintain her center of gravity over her stance foot/feet  Stepping over 2 mini hurdles without AD CGA to min A 4x2  Cues to place and maintain her center of gravity over her stance foot, especially when R LE weight bearing.    LOB 3x secondary to pt body weight too far to the L when on R LE.   SLS on R LE with B UE  assist 10x5 seconds   R SI joint area discomfort.   Standing static mini lunge onto LE with contralateral UE assist   R 10x3  L 10x3  Side stepping 30 ft to the R and 30 ft to the L for 2 sets  Forward step up onto Air Ex pad with L UE assist   R 10x3   Stepping over 2 mini hurdles without AD CGA to min A 4x  Cues to place and maintain her center of gravity over her stance foot, especially when R LE weight bearing.    Stepping down a curb outside with R LE, cues to place her center of gravity onto her R foot/base of support. CGA   Improved technique, movement at target joints, use of target muscles after mod verbal, visual, tactile cues.           PATIENT EDUCATION:  Education details: there-ex, HEP Person educated: Patient, Child(ren), and Estate manager/land agent Education method: Explanation, Demonstration, Tactile cues, Verbal cues, and Handouts Education comprehension: verbalized understanding and returned demonstration  HOME EXERCISE PROGRAM: Access Code: [email or web address]  URL: [email or web address] Date: 08/08/2023 Prepared by: Suzzane Estes  Exercises - Seated Flexion Stretch  - 3 x daily - 7 x weekly - 3 sets - 10 reps - 5 seconds hold - Seated Transversus Abdominis Bracing  - 3 x daily - 7 x weekly - 3 sets - 10 reps - 5 seconds hold - Seated Gluteal Sets  - 3 x daily - 7 x weekly - 3 sets - 10 reps - 5 seconds hold  - Hooklying Clamshell  with Resistance  - 1 x daily - 7 x weekly - 6 sets - 5 reps  Yellow band  Hooklying Hip extension isometrics, leg straight    R 10x5 seconds for 3 sets    L 10x5 seconds for 3 sets   - Supine Piriformis Stretch  - 3 x daily - 7 x weekly - 1 sets - 5 reps - 30 seconds hold  - Standing Hip Abduction with Counter Support  - 1 x daily - 7 x weekly - 3 sets - 10 reps - 5 second holds hold  Yellow band around knees    ASSESSMENT:  CLINICAL IMPRESSION: Worked on balance and obstacle negotiation with cues to place and maintain her center of gravity over her base of support (foot) to help decrease fall risk. Pt demonstrates tendency to lose balance to the L consistently which decreases with cues to place and maintain her center of gravity over her R foot.  Pt tolerated session well without aggravation of symptoms. Pt will benefit from continued skilled physical therapy services to continue improving strength, balance and function.       OBJECTIVE IMPAIRMENTS: Abnormal gait, decreased balance, difficulty walking, decreased strength, improper body mechanics, postural dysfunction, and pain.   ACTIVITY LIMITATIONS: carrying, lifting, bending, standing, squatting, stairs, transfers, and locomotion level  PARTICIPATION LIMITATIONS:   PERSONAL FACTORS: Age, Fitness, Time since onset of injury/illness/exacerbation, and 3+ comorbidities: heart failure, A-fib, ascending aorta dilation, osteoarthritis, syncope are also ; affecting patient's functional outcome.   REHAB POTENTIAL: Fair    CLINICAL DECISION MAKING: Stable/uncomplicated  EVALUATION COMPLEXITY: Low   GOALS: Goals reviewed with patient? Yes  SHORT TERM GOALS: Target date: 08/22/2023  Pt will be independent with her initial HEP to decrease pain, improve strength, balance, and function.  Baseline: Pt has not yet started her initial HEP (08/06/2023); No questions, able to perform her HEP (09/12/2023) Goal status: MET    LONG TERM  GOALS: Target date: 11/21/2023  Pt will have a decrease in low back pain to 3/10 or less at worst to promote ability to perform standing tasks such as yard work more comfortably.  Baseline: 7/10 low back pain at worst for the past 3 months (08/06/2023); 3/10 at least at most for the past 7 days (09/12/2023); 1-2/10 at worst for the past 7 days (10/06/2023) Goal status: MET   2.  Pt will improve her Modified Oswestry Low Back Pain Disability Questionnaire to 10% or less as a demonstration of improved function.  Baseline: Modified Oswestry 9/50 (18%) (08/06/2023); 12/50 (24%) ( 09/12/2023); 11/50 (22%) (10/16/2023) Goal status: ONGOING  3.  Pt will improve her B hip extension, ER, and abduction strength by at least 1/2 MMT grade to promote ability to perform closed chain tasks more comfortably for her back.  Baseline:  MMT Right eval Left eval R (09/12/2023) L  (09/12/2023) R (10/06/2023) L (10/06/2023)  Hip extension (seated manually resisted) 3 3+ 4 4 4+ 4  Hip abduction (seated manually resisted) 4 4 4+ 4+ 5 5  Hip external rotation 4 4- 4 4 4 4    (08/06/2023)   MMT Right (10/16/2023) Left (10/16/2023)  Hip extension (seated manually resisted)    Hip abduction (seated manually resisted)    Hip external rotation 4+      Goal status: MET    4.  Pt will improver her Timed Up and Go time to 12 seconds or less with her [facility] as a demonstration of improved functional mobility  Baseline: 17.8 seconds average with SPC on L side (08/06/2023); 14.9 seconds averange with SPC on L side (09/12/2023); 16.58  seconds on average with SPC on L side (10/06/2023); 13.4 seconds average without the SPC (10/16/2023) Goal status: Progressing.   5.  Pt will improve her Dynamic Gait Index (DGI) Score to 19/24 or more as a demonstration of improved balance.  Baseline: DGI score: 15/24 (10/06/2023); 17/24 (10/16/2023) Goal status: IN PROGRESS    PLAN:  PT FREQUENCY: 2x/week  PT DURATION: other: 5 weeks  PLANNED  INTERVENTIONS: 97110-Therapeutic exercises, 97530- Therapeutic activity, 97112- Neuromuscular re-education, 97535- Self Care, 28413- Manual therapy, [phone]- Gait training, [phone]- Canalith repositioning, [id number]- Electrical stimulation (unattended), [phone]- Ionotophoresis 4mg /ml Dexamethasone , Patient/Family education, Balance training, Stair training, Dry Needling, Joint mobilization, and Vestibular training.  PLAN FOR NEXT SESSION: Posture, trunk and hip strength, femoral control, functional strengthening, balance, manual techniques, modalities PRN   Aarik Blank, PT, DPT 11/05/2023, 4:08 PM

## 2023-11-11 ENCOUNTER — Ambulatory Visit

## 2023-11-11 DIAGNOSIS — M5459 Other low back pain: Secondary | ICD-10-CM

## 2023-11-11 DIAGNOSIS — R262 Difficulty in walking, not elsewhere classified: Secondary | ICD-10-CM

## 2023-11-11 DIAGNOSIS — M6281 Muscle weakness (generalized): Secondary | ICD-10-CM | POA: Diagnosis not present

## 2023-11-11 DIAGNOSIS — R2681 Unsteadiness on feet: Secondary | ICD-10-CM | POA: Diagnosis not present

## 2023-11-11 NOTE — Therapy (Signed)
 OUTPATIENT PHYSICAL THERAPY TREATMENT And Progress Report (09/12/2023 - 11/11/2023)  Patient Name: Deborah Baxter MRN: [id number] DOB:1933-09-06, 89 y.o., female Today's Date: 11/11/2023  END OF SESSION:  PT End of Session - 11/11/23 1521     Visit Number 20    Date for PT Re-Evaluation 11/21/23    Authorization Type Medicare A & B    Progress Note Due on Visit 10    PT Start Time 1521    PT Stop Time 1600    PT Time Calculation (min) 39 min    Equipment Utilized During Treatment Gait belt    Activity Tolerance Patient tolerated treatment well    Behavior During Therapy WFL for tasks assessed/performed                               Past Medical History:  Diagnosis Date   (HFpEF) heart failure with preserved ejection fraction (HCC) 04/24/2021   Pulmonary raised concern - question of pulmonary hypertension secondary to DD.  Question of need for right heart cath   A-fib [facility]) 04/24/2021   Acute heart failure with preserved ejection fraction (HCC)    Ascending aorta dilatation (HCC) 08/01/2021   Atrial fibrillation with rapid ventricular response (HCC) 03/20/2021   Congestion of nasal sinus 10/13/2021   Cough 04/23/2021   Diverticulosis 09/05/2012   Colonoscopy 08/31/12 - redundant colon and diverticulosis in the sigmoid colon, descending colon, transverse colon and hepatic flexure.  Recommend f/u colonoscopy in 10 years.    Elevated bilirubin 09/13/2021   Elevated blood pressure reading 12/05/2020   Health care maintenance 07/13/2014   Physical 07/12/14.  Colonoscopy 08/31/12.  Normal bone density 04/06/10.  Mammogram 05/30/20- Birads I.     Hx of degenerative disc disease    Hypercholesterolemia    Hypertensive urgency 03/20/2021   Ingrown toenail 09/13/2021   Left hand pain 03/20/2021   Low back pain 01/29/2021   Lumbago 07/13/2014   Lumbar spinal stenosis 06/27/2012   Neuromuscular disorder (HCC)    pinched nerve   Numbness and tingling in left hand 08/21/2017    Osteoarthritis    Pleural effusion 09/13/2021   Pulmonary edema 03/20/2021   Seasonal allergies    Spinal stenosis    chronic back and leg pain   Stress 02/03/2021   Syncope 12/05/2020   Thrombocytopenia (HCC) 09/13/2021   Past Surgical History:  Procedure Laterality Date   APPENDECTOMY     bladder tack surgery  2000   BTL  1976   CARDIOVERSION N/A 05/11/2021   Procedure: CARDIOVERSION;  Surgeon: Devorah Fonder, MD;  Location: [facility] ORS;  Service: Cardiovascular;  Laterality: N/A;   CARPAL TUNNEL RELEASE     CATARACT EXTRACTION W/PHACO Left 12/07/2015   Procedure: CATARACT EXTRACTION PHACO AND INTRAOCULAR LENS PLACEMENT (IOC);  Surgeon: Rosa College, MD;  Location: [facility] ORS;  Service: Ophthalmology;  Laterality: Left;  US  1.23 AP% 23.2 CDE 19.28 Fluid Pack Lot # [id number]   EYE SURGERY  03/19/15   GANGLION CYST EXCISION     right hand   GAS/FLUID EXCHANGE Left 03/08/2015   Procedure: GAS/FLUID EXCHANGE;  Surgeon: Stephenie Einstein, MD;  Location: [facility] ORS;  Service: Ophthalmology;  Laterality: Left;   RIGHT HEART CATH N/A 11/06/2021   Procedure: RIGHT HEART CATH;  Surgeon: Sammy Crisp, MD;  Location: [facility] INVASIVE CV LAB;  Service: Cardiovascular;  Laterality: N/A;   TONSILLECTOMY     TUBAL LIGATION  VITRECTOMY 25 GAUGE WITH SCLERAL BUCKLE Left 03/08/2015   Procedure: VITRECTOMY 25 GAUGE, membrane peel, 26 %  SF6 gas exchange;  Surgeon: Stephenie Einstein, MD;  Location: [facility] ORS;  Service: Ophthalmology;  Laterality: Left;  casette lot # [id number]   Patient Active Problem List   Diagnosis Date Noted   Skin lesion 09/16/2023   Unsteady gait 05/16/2023   Arm pain 08/18/2022   Hx of degenerative disc disease 10/23/2021   Osteoarthritis 10/23/2021   Seasonal allergies 10/23/2021   Spinal stenosis 10/23/2021   Elevated bilirubin 09/13/2021   Thrombocytopenia (HCC) 09/13/2021   Ingrown toenail 09/13/2021   Ascending aorta dilatation (HCC) 08/01/2021   A-fib (HCC) 04/24/2021    (HFpEF) heart failure with preserved ejection fraction (HCC) 04/24/2021   Acute heart failure with preserved ejection fraction [facility])    Atrial fibrillation with rapid ventricular response (HCC) 03/20/2021   Left hand pain 03/20/2021   Stress 02/03/2021   Low back pain 01/29/2021   Syncope 12/05/2020   Elevated blood pressure reading 12/05/2020   Numbness and tingling in left hand 08/21/2017   Lumbago 07/13/2014   Health care maintenance 07/13/2014   Diverticulosis 09/05/2012   Hypercholesterolemia 06/27/2012   Lumbar spinal stenosis 06/27/2012    PCP: Dellar Fenton, MD  REFERRING PROVIDER: Dellar Fenton, MD  REFERRING DIAG: R26.81 (ICD-10-CM) - Unsteady [phone] (ICD-10-CM) - Right-sided low back pain with right-sided sciatica, unspecified chronicity  Rationale for Evaluation and Treatment: Rehabilitation  THERAPY DIAG:  Other low back pain  Difficulty in walking, not elsewhere classified  Muscle weakness (generalized)  Unsteadiness on feet  ONSET DATE: 05/16/2023 (date PT referral was signed)  SUBJECTIVE:                                                                                                                                                                                           SUBJECTIVE STATEMENT: The balance and the walking are ok. The R low back/hip pain is what gets her. 3/10 currently.      PERTINENT HISTORY:  R sided low back pain, and unsteady gait. Chronic, which progressively worsened over the years. Has gone to a chiropractor for a while which stopped helping. Has not had back surgery. No osteoporosis to her knowledge. Balance is also not as good, such as when standing. [name] a long time ago 50 years ago which bothered her R low back. Back pain has remained the same since several years ago. Feels R LE radiating symptoms to her foot (L5 dermatome on R)   Blood pressure is controlled per pt.   No latex allergies     PAIN:  Are you  having  pain? Yes: NPRS scale: 4/10 Pain location: R low back/posterior hip Pain description: catching; dull pain Aggravating factors: weather, picking up sticks in the yard (daughter states pt has a very high tolerance for pain) Relieving factors: ice pack, sitting eases back pain, tylenol   PRECAUTIONS: Fall risk  RED FLAGS: Bowel or bladder incontinence: Yes: Bladder, doctor Scott aware. and Cauda equina syndrome: No   WEIGHT BEARING RESTRICTIONS: No  FALLS:  Has patient fallen in last 6 months? No  LIVING ENVIRONMENT: Lives with: lives alone Lives in: House/apartment Stairs: Yes: Internal: about 12 to the basement to do laundry 2x/week steps; L rail going down to the basement and External: 2 steps; on right going up Has following equipment at home: Single point cane and Shower bench  OCCUPATION: retired  PLOF: Requires assistive device for independence  PATIENT GOALS: Be without the pain. Improve balance.   NEXT MD VISIT: Yes but pt does not know. Has a wellness visit.   OBJECTIVE:  Note: Objective measures were completed at Evaluation unless otherwise noted.  DIAGNOSTIC FINDINGS:  DG Lumbar Spine 2-3 Views 01/30/2024  Narrative & Impression  CLINICAL DATA:  Low back pain, radiating down right leg   EXAM: LUMBAR SPINE - 2-3 VIEW   COMPARISON:  2019   FINDINGS: Alignment is similar with levoscoliosis. Diffusely decreased osseous mineralization in combination with overlying bowel gas limits evaluation. Vertebral body heights appear similar. Multilevel disc space narrowing, endplate osteophytes, and facet hypertrophy.   IMPRESSION: Suboptimal evaluation due to demineralization. Multilevel spondylosis superimposed on scoliosis similar to prior study.     Electronically Signed   By: Geannie Keener M.D.   On: 01/30/2021 09:09    PATIENT SURVEYS:  Modified Oswestry 9/50 (18%) (08/06/2023)   COGNITION: Overall cognitive status: Within functional limits for tasks  assessed     SENSATION:   MUSCLE LENGTH:    POSTURE: forward neck B protracted shoulders, R shoulder lower, forward flexed, R iliac crest and greater trochanter higher, L lumbar convexity around L3, L knee in slight flexion, R knee genu valgus.   PALPATION: TTP to R posterior hip/piriformis (proximal attachment and muscle belly), TTP around R posterior hip around Sciatic nerve.  LUMBAR ROM:   AROM eval  Flexion WFL with R low back pain  Extension Limited with low back pain  Right lateral flexion WFL  Left lateral flexion WFL  Right rotation [facility] with R low back pain  Left rotation WFL   (Blank rows = not tested)  LOWER EXTREMITY ROM:     Passive  Right (09/05/2023) Left (09/05/2023)  Hip flexion    Hip extension    Hip abduction    Hip adduction    Hip internal rotation WFL Limited with reproduction of R posterior hip pain  Hip external rotation    Knee flexion    Knee extension    Ankle dorsiflexion    Ankle plantarflexion    Ankle inversion    Ankle eversion     (Blank rows = not tested)  LOWER EXTREMITY MMT:    MMT Right eval Left eval  Hip flexion 4- 4-  Hip extension (seated manually resisted) 3 3+  Hip abduction (seated manually resisted) 4 4  Hip adduction    Hip internal rotation    Hip external rotation 4 4-  Knee flexion 4 4  Knee extension 4+ 5  Ankle dorsiflexion    Ankle plantarflexion    Ankle inversion    Ankle eversion     (  Blank rows = not tested)  LUMBAR SPECIAL TESTS:    FUNCTIONAL TESTS:  5 times sit to stand: unable to test at this time Timed up and go (TUG): 18.24 seconds; 17.24 s, 17.88 seconds with SPC on L; (17.8 seconds average)  Dynamic Gait Index: unable to test at this time Decreased femoral control R with STS   GAIT: Distance walked: 30 ft Assistive device utilized: Single point cane Level of assistance: Modified independence Comments: Antalic, decreased stance R LE, SPC on R, R knee genu valgus during stance phase  of gait  TREATMENT DATE: 11/11/2023                                                                                                                                Neuromuscular re education  Directed patient with gait with normal gait speed, with changes in speed, 180 degree pivot turn, with R and L cervical rotation position, with cervical flexion and extension position, stepping around obstacles, stepping over an obstacle, ascending and descending 4 regular steps with UE assist   Without use of SPC: 16/24  Standing up from a chair, walking 10 ft forward, then returning 10 ft, then sitting back onto chair 3x With SPC 11 seconds.  Without SPC: 9 seconds, 10 seconds, 11 seconds (10 seconds average)  Stepping over 2 mini hurdles without AD CGA to min A 10x, then 6x.   Cues to place and maintain her center of gravity over her stance foot, especially when R LE weight bearing.   Seated trunk flexion stretch 30 seconds x 2 to decrease low back extension pressure.    Tandem walking 10 ft forward CGA to min A 6x without AD  Cues to place and maintain her center of gravity over her stance foot/feet     Improved technique, movement at target joints, use of target muscles after mod verbal, visual, tactile cues.           PATIENT EDUCATION:  Education details: there-ex, HEP Person educated: Patient, Child(ren), and Estate manager/land agent Education method: Explanation, Demonstration, Tactile cues, Verbal cues, and Handouts Education comprehension: verbalized understanding and returned demonstration  HOME EXERCISE PROGRAM: Access Code: [email or web address]  URL: [email or web address] Date: 08/08/2023 Prepared by: Suzzane Estes  Exercises - Seated Flexion Stretch  - 3 x daily - 7 x weekly - 3 sets - 10 reps - 5 seconds hold - Seated Transversus Abdominis Bracing  - 3 x daily - 7 x weekly - 3 sets - 10 reps - 5 seconds hold - Seated Gluteal Sets  - 3 x daily - 7 x weekly - 3  sets - 10 reps - 5 seconds hold  - Hooklying Clamshell with Resistance  - 1 x daily - 7 x weekly - 6 sets - 5 reps  Yellow band  Hooklying Hip extension isometrics, leg straight    R 10x5 seconds for 3 sets    L 10x5 seconds for 3 sets   -  Supine Piriformis Stretch  - 3 x daily - 7 x weekly - 1 sets - 5 reps - 30 seconds hold  - Standing Hip Abduction with Counter Support  - 1 x daily - 7 x weekly - 3 sets - 10 reps - 5 second holds hold  Yellow band around knees    ASSESSMENT:  CLINICAL IMPRESSION: Pt demonstrates improved functional mobility and function compared to previous measurements. Still demonstrates difficulty stepping over obstacles without UE assist but improves when cued to place and maintain her center of gravity over her base of support and to perform the obstacle negotiation task more slowly. Pt also able to demonstrate significant change gait speed well without LOB and no use of AD. Pt making very good progress with PT towards goals.   Pt will benefit from continued skilled physical therapy services to continue improving strength, balance and function.       OBJECTIVE IMPAIRMENTS: Abnormal gait, decreased balance, difficulty walking, decreased strength, improper body mechanics, postural dysfunction, and pain.   ACTIVITY LIMITATIONS: carrying, lifting, bending, standing, squatting, stairs, transfers, and locomotion level  PARTICIPATION LIMITATIONS:   PERSONAL FACTORS: Age, Fitness, Time since onset of injury/illness/exacerbation, and 3+ comorbidities: heart failure, A-fib, ascending aorta dilation, osteoarthritis, syncope are also ; affecting patient's functional outcome.   REHAB POTENTIAL: Fair    CLINICAL DECISION MAKING: Stable/uncomplicated  EVALUATION COMPLEXITY: Low   GOALS: Goals reviewed with patient? Yes  SHORT TERM GOALS: Target date: 08/22/2023  Pt will be independent with her initial HEP to decrease pain, improve strength, balance, and function.   Baseline: Pt has not yet started her initial HEP (08/06/2023); No questions, able to perform her HEP (09/12/2023) Goal status: MET    LONG TERM GOALS: Target date: 11/21/2023  Pt will have a decrease in low back pain to 3/10 or less at worst to promote ability to perform standing tasks such as yard work more comfortably.  Baseline: 7/10 low back pain at worst for the past 3 months (08/06/2023); 3/10 at least at most for the past 7 days (09/12/2023); 1-2/10 at worst for the past 7 days (10/06/2023) Goal status: MET   2.  Pt will improve her Modified Oswestry Low Back Pain Disability Questionnaire to 10% or less as a demonstration of improved function.  Baseline: Modified Oswestry 9/50 (18%) (08/06/2023); 12/50 (24%) ( 09/12/2023); 11/50 (22%) (10/16/2023); 7/50 (14%) (11/11/2023) Goal status: PROGRESSING  3.  Pt will improve her B hip extension, ER, and abduction strength by at least 1/2 MMT grade to promote ability to perform closed chain tasks more comfortably for her back.  Baseline:  MMT Right eval Left eval R (09/12/2023) L  (09/12/2023) R (10/06/2023) L (10/06/2023)  Hip extension (seated manually resisted) 3 3+ 4 4 4+ 4  Hip abduction (seated manually resisted) 4 4 4+ 4+ 5 5  Hip external rotation 4 4- 4 4 4 4    (08/06/2023)   MMT Right (10/16/2023) Left (10/16/2023)  Hip extension (seated manually resisted)    Hip abduction (seated manually resisted)    Hip external rotation 4+      Goal status: MET    4.  Pt will improver her Timed Up and Go time to 12 seconds or less with her [facility] as a demonstration of improved functional mobility  Baseline: 17.8 seconds average with SPC on L side (08/06/2023); 14.9 seconds averange with SPC on L side (09/12/2023); 16.58 seconds on average with SPC on L side (10/06/2023); 13.4 seconds average without the SPC (  10/16/2023); 10 seconds average without SPC (11/11/2023) Goal status: MET  5.  Pt will improve her Dynamic Gait Index (DGI) Score to 19/24 or more as  a demonstration of improved balance.  Baseline: DGI score: 15/24 (10/06/2023); 17/24 (10/16/2023); 16/24 (11/11/2023) Goal status: IN PROGRESS    PLAN:  PT FREQUENCY: 2x/week  PT DURATION: other: 5 weeks  PLANNED INTERVENTIONS: 97110-Therapeutic exercises, 97530- Therapeutic activity, 97112- Neuromuscular re-education, 97535- Self Care, [id number]- Manual therapy, [phone]- Gait training, [phone]- Canalith repositioning, [id number]- Electrical stimulation (unattended), [phone]- Ionotophoresis 4mg /ml Dexamethasone , Patient/Family education, Balance training, Stair training, Dry Needling, Joint mobilization, and Vestibular training.  PLAN FOR NEXT SESSION: Posture, trunk and hip strength, femoral control, functional strengthening, balance, manual techniques, modalities PRN  Thank you for your referral.  Briena Swingler, PT, DPT 11/11/2023, 4:24 PM

## 2023-11-18 ENCOUNTER — Ambulatory Visit

## 2023-11-18 DIAGNOSIS — R2681 Unsteadiness on feet: Secondary | ICD-10-CM

## 2023-11-18 DIAGNOSIS — R262 Difficulty in walking, not elsewhere classified: Secondary | ICD-10-CM

## 2023-11-18 DIAGNOSIS — M5459 Other low back pain: Secondary | ICD-10-CM | POA: Diagnosis not present

## 2023-11-18 DIAGNOSIS — M6281 Muscle weakness (generalized): Secondary | ICD-10-CM

## 2023-11-18 NOTE — Therapy (Signed)
 OUTPATIENT PHYSICAL THERAPY TREATMENT And Discharge Summary  Patient Name: Deborah Baxter MRN: [id number] DOB:02-28-1934, 89 y.o., female Today's Date: 11/18/2023  END OF SESSION:  PT End of Session - 11/18/23 0946     Visit Number 21    Date for PT Re-Evaluation 11/21/23    Authorization Type Medicare A & B    Progress Note Due on Visit 10    PT Start Time [phone]    PT Stop Time 1025    PT Time Calculation (min) 39 min    Equipment Utilized During Treatment Gait belt    Activity Tolerance Patient tolerated treatment well    Behavior During Therapy WFL for tasks assessed/performed                             Past Medical History:  Diagnosis Date   (HFpEF) heart failure with preserved ejection fraction (HCC) 04/24/2021   Pulmonary raised concern - question of pulmonary hypertension secondary to DD.  Question of need for right heart cath   A-fib [facility]) 04/24/2021   Acute heart failure with preserved ejection fraction (HCC)    Ascending aorta dilatation (HCC) 08/01/2021   Atrial fibrillation with rapid ventricular response (HCC) 03/20/2021   Congestion of nasal sinus 10/13/2021   Cough 04/23/2021   Diverticulosis 09/05/2012   Colonoscopy 08/31/12 - redundant colon and diverticulosis in the sigmoid colon, descending colon, transverse colon and hepatic flexure.  Recommend f/u colonoscopy in 10 years.    Elevated bilirubin 09/13/2021   Elevated blood pressure reading 12/05/2020   Health care maintenance 07/13/2014   Physical 07/12/14.  Colonoscopy 08/31/12.  Normal bone density 04/06/10.  Mammogram 05/30/20- Birads I.     Hx of degenerative disc disease    Hypercholesterolemia    Hypertensive urgency 03/20/2021   Ingrown toenail 09/13/2021   Left hand pain 03/20/2021   Low back pain 01/29/2021   Lumbago 07/13/2014   Lumbar spinal stenosis 06/27/2012   Neuromuscular disorder (HCC)    pinched nerve   Numbness and tingling in left hand 08/21/2017   Osteoarthritis    Pleural  effusion 09/13/2021   Pulmonary edema 03/20/2021   Seasonal allergies    Spinal stenosis    chronic back and leg pain   Stress 02/03/2021   Syncope 12/05/2020   Thrombocytopenia (HCC) 09/13/2021   Past Surgical History:  Procedure Laterality Date   APPENDECTOMY     bladder tack surgery  2000   BTL  1976   CARDIOVERSION N/A 05/11/2021   Procedure: CARDIOVERSION;  Surgeon: Devorah Fonder, MD;  Location: [facility] ORS;  Service: Cardiovascular;  Laterality: N/A;   CARPAL TUNNEL RELEASE     CATARACT EXTRACTION W/PHACO Left 12/07/2015   Procedure: CATARACT EXTRACTION PHACO AND INTRAOCULAR LENS PLACEMENT (IOC);  Surgeon: Rosa College, MD;  Location: [facility] ORS;  Service: Ophthalmology;  Laterality: Left;  US  1.23 AP% 23.2 CDE 19.28 Fluid Pack Lot # [id number]   EYE SURGERY  03/19/15   GANGLION CYST EXCISION     right hand   GAS/FLUID EXCHANGE Left 03/08/2015   Procedure: GAS/FLUID EXCHANGE;  Surgeon: Stephenie Einstein, MD;  Location: [facility] ORS;  Service: Ophthalmology;  Laterality: Left;   RIGHT HEART CATH N/A 11/06/2021   Procedure: RIGHT HEART CATH;  Surgeon: Sammy Crisp, MD;  Location: [facility] INVASIVE CV LAB;  Service: Cardiovascular;  Laterality: N/A;   TONSILLECTOMY     TUBAL LIGATION     VITRECTOMY 25 GAUGE WITH SCLERAL  BUCKLE Left 03/08/2015   Procedure: VITRECTOMY 25 GAUGE, membrane peel, 26 %  SF6 gas exchange;  Surgeon: Stephenie Einstein, MD;  Location: [facility] ORS;  Service: Ophthalmology;  Laterality: Left;  casette lot # [id number]   Patient Active Problem List   Diagnosis Date Noted   Skin lesion 09/16/2023   Unsteady gait 05/16/2023   Arm pain 08/18/2022   Hx of degenerative disc disease 10/23/2021   Osteoarthritis 10/23/2021   Seasonal allergies 10/23/2021   Spinal stenosis 10/23/2021   Elevated bilirubin 09/13/2021   Thrombocytopenia (HCC) 09/13/2021   Ingrown toenail 09/13/2021   Ascending aorta dilatation (HCC) 08/01/2021   A-fib (HCC) 04/24/2021   (HFpEF) heart failure with  preserved ejection fraction (HCC) 04/24/2021   Acute heart failure with preserved ejection fraction [facility])    Atrial fibrillation with rapid ventricular response (HCC) 03/20/2021   Left hand pain 03/20/2021   Stress 02/03/2021   Low back pain 01/29/2021   Syncope 12/05/2020   Elevated blood pressure reading 12/05/2020   Numbness and tingling in left hand 08/21/2017   Lumbago 07/13/2014   Health care maintenance 07/13/2014   Diverticulosis 09/05/2012   Hypercholesterolemia 06/27/2012   Lumbar spinal stenosis 06/27/2012    PCP: Dellar Fenton, MD  REFERRING PROVIDER: Dellar Fenton, MD  REFERRING DIAG: R26.81 (ICD-10-CM) - Unsteady [phone] (ICD-10-CM) - Right-sided low back pain with right-sided sciatica, unspecified chronicity  Rationale for Evaluation and Treatment: Rehabilitation  THERAPY DIAG:  Other low back pain  Difficulty in walking, not elsewhere classified  Muscle weakness (generalized)  Unsteadiness on feet  ONSET DATE: 05/16/2023 (date PT referral was signed)  SUBJECTIVE:                                                                                                                                                                                           SUBJECTIVE STATEMENT: Doing ok. R low back/hip is about a 2-3/10 currently. Does think that she is more steady on her feet. Feels like she can graduate PT today and continue with her HEP.      PERTINENT HISTORY:  R sided low back pain, and unsteady gait. Chronic, which progressively worsened over the years. Has gone to a chiropractor for a while which stopped helping. Has not had back surgery. No osteoporosis to her knowledge. Balance is also not as good, such as when standing. [name] a long time ago 50 years ago which bothered her R low back. Back pain has remained the same since several years ago. Feels R LE radiating symptoms to her foot (L5 dermatome on R)   Blood pressure is controlled per pt.   No  latex allergies     PAIN:  Are you having pain? Yes: NPRS scale: 4/10 Pain location: R low back/posterior hip Pain description: catching; dull pain Aggravating factors: weather, picking up sticks in the yard (daughter states pt has a very high tolerance for pain) Relieving factors: ice pack, sitting eases back pain, tylenol   PRECAUTIONS: Fall risk  RED FLAGS: Bowel or bladder incontinence: Yes: Bladder, doctor Scott aware. and Cauda equina syndrome: No   WEIGHT BEARING RESTRICTIONS: No  FALLS:  Has patient fallen in last 6 months? No  LIVING ENVIRONMENT: Lives with: lives alone Lives in: House/apartment Stairs: Yes: Internal: about 12 to the basement to do laundry 2x/week steps; L rail going down to the basement and External: 2 steps; on right going up Has following equipment at home: Single point cane and Shower bench  OCCUPATION: retired  PLOF: Requires assistive device for independence  PATIENT GOALS: Be without the pain. Improve balance.   NEXT MD VISIT: Yes but pt does not know. Has a wellness visit.   OBJECTIVE:  Note: Objective measures were completed at Evaluation unless otherwise noted.  DIAGNOSTIC FINDINGS:  DG Lumbar Spine 2-3 Views 01/30/2024  Narrative & Impression  CLINICAL DATA:  Low back pain, radiating down right leg   EXAM: LUMBAR SPINE - 2-3 VIEW   COMPARISON:  2019   FINDINGS: Alignment is similar with levoscoliosis. Diffusely decreased osseous mineralization in combination with overlying bowel gas limits evaluation. Vertebral body heights appear similar. Multilevel disc space narrowing, endplate osteophytes, and facet hypertrophy.   IMPRESSION: Suboptimal evaluation due to demineralization. Multilevel spondylosis superimposed on scoliosis similar to prior study.     Electronically Signed   By: Geannie Keener M.D.   On: 01/30/2021 09:09    PATIENT SURVEYS:  Modified Oswestry 9/50 (18%) (08/06/2023)   COGNITION: Overall cognitive  status: Within functional limits for tasks assessed     SENSATION:   MUSCLE LENGTH:    POSTURE: forward neck B protracted shoulders, R shoulder lower, forward flexed, R iliac crest and greater trochanter higher, L lumbar convexity around L3, L knee in slight flexion, R knee genu valgus.   PALPATION: TTP to R posterior hip/piriformis (proximal attachment and muscle belly), TTP around R posterior hip around Sciatic nerve.  LUMBAR ROM:   AROM eval  Flexion WFL with R low back pain  Extension Limited with low back pain  Right lateral flexion WFL  Left lateral flexion WFL  Right rotation [facility] with R low back pain  Left rotation WFL   (Blank rows = not tested)  LOWER EXTREMITY ROM:     Passive  Right (09/05/2023) Left (09/05/2023)  Hip flexion    Hip extension    Hip abduction    Hip adduction    Hip internal rotation WFL Limited with reproduction of R posterior hip pain  Hip external rotation    Knee flexion    Knee extension    Ankle dorsiflexion    Ankle plantarflexion    Ankle inversion    Ankle eversion     (Blank rows = not tested)  LOWER EXTREMITY MMT:    MMT Right eval Left eval  Hip flexion 4- 4-  Hip extension (seated manually resisted) 3 3+  Hip abduction (seated manually resisted) 4 4  Hip adduction    Hip internal rotation    Hip external rotation 4 4-  Knee flexion 4 4  Knee extension 4+ 5  Ankle dorsiflexion    Ankle plantarflexion    Ankle  inversion    Ankle eversion     (Blank rows = not tested)  LUMBAR SPECIAL TESTS:    FUNCTIONAL TESTS:  5 times sit to stand: unable to test at this time Timed up and go (TUG): 18.24 seconds; 17.24 s, 17.88 seconds with SPC on L; (17.8 seconds average)  Dynamic Gait Index: unable to test at this time Decreased femoral control R with STS   GAIT: Distance walked: 30 ft Assistive device utilized: Single point cane Level of assistance: Modified independence Comments: Antalic, decreased stance R LE, SPC on  R, R knee genu valgus during stance phase of gait  TREATMENT DATE: 11/18/2023                                                                                                                                Neuromuscular re education  Seated piriformis stretch   L 30 seconds x 3   R 30 seconds x 3  Stepping over 2 mini hurdles without AD CGA to min A 10x2   Cues to place and maintain her center of gravity over her stance foot  Tandem walking 10 ft forward CGA to min A 6x without AD   Standing static mini lunge onto LE with contralateral UE assist                R 10x3               L 10x3   Side stepping 30 ft to the R and 30 ft to the L for 2 sets     Improved technique, movement at target joints, use of target muscles after mod verbal, visual, tactile cues.           PATIENT EDUCATION:  Education details: there-ex, HEP Person educated: Patient, Child(ren), and Estate manager/land agent Education method: Explanation, Demonstration, Tactile cues, Verbal cues, and Handouts Education comprehension: verbalized understanding and returned demonstration  HOME EXERCISE PROGRAM: Access Code: [email or web address]  URL: [email or web address] Date: 08/08/2023 Prepared by: Suzzane Estes  Exercises - Seated Flexion Stretch  - 3 x daily - 7 x weekly - 3 sets - 10 reps - 5 seconds hold - Seated Transversus Abdominis Bracing  - 3 x daily - 7 x weekly - 3 sets - 10 reps - 5 seconds hold - Seated Gluteal Sets  - 3 x daily - 7 x weekly - 3 sets - 10 reps - 5 seconds hold  - Hooklying Clamshell with Resistance  - 1 x daily - 7 x weekly - 6 sets - 5 reps  Yellow band  Hooklying Hip extension isometrics, leg straight    R 10x5 seconds for 3 sets    L 10x5 seconds for 3 sets   - Supine Piriformis Stretch  - 3 x daily - 7 x weekly - 1 sets - 5 reps - 30 seconds hold  - Standing Hip Abduction with Counter Support  - 1 x daily -  7 x weekly - 3 sets - 10 reps - 5 second holds  hold  Yellow band around knees    ASSESSMENT:  CLINICAL IMPRESSION:  Pt demonstrates overall improved R low back and posterior hip pain, improved B hip strength, and improved ability to ambulate with less difficulty since initial evaluation. Improved overall ability to negotiate/step over obstacles and maintain balance over a more narrow base of support as well since initial evaluation. Pt has made good progress with PT towards goals. Skilled physical therapy services discharged with pt continuing progress with her exercises at home.        OBJECTIVE IMPAIRMENTS: Abnormal gait, decreased balance, difficulty walking, decreased strength, improper body mechanics, postural dysfunction, and pain.   ACTIVITY LIMITATIONS: carrying, lifting, bending, standing, squatting, stairs, transfers, and locomotion level  PARTICIPATION LIMITATIONS:   PERSONAL FACTORS: Age, Fitness, Time since onset of injury/illness/exacerbation, and 3+ comorbidities: heart failure, A-fib, ascending aorta dilation, osteoarthritis, syncope are also ; affecting patient's functional outcome.   REHAB POTENTIAL: Fair    CLINICAL DECISION MAKING: Stable/uncomplicated  EVALUATION COMPLEXITY: Low   GOALS: Goals reviewed with patient? Yes  SHORT TERM GOALS: Target date: 08/22/2023  Pt will be independent with her initial HEP to decrease pain, improve strength, balance, and function.  Baseline: Pt has not yet started her initial HEP (08/06/2023); No questions, able to perform her HEP (09/12/2023) Goal status: MET    LONG TERM GOALS: Target date: 11/21/2023  Pt will have a decrease in low back pain to 3/10 or less at worst to promote ability to perform standing tasks such as yard work more comfortably.  Baseline: 7/10 low back pain at worst for the past 3 months (08/06/2023); 3/10 at least at most for the past 7 days (09/12/2023); 1-2/10 at worst for the past 7 days (10/06/2023) Goal status: MET   2.  Pt will improve her  Modified Oswestry Low Back Pain Disability Questionnaire to 10% or less as a demonstration of improved function.  Baseline: Modified Oswestry 9/50 (18%) (08/06/2023); 12/50 (24%) ( 09/12/2023); 11/50 (22%) (10/16/2023); 7/50 (14%) (11/11/2023) Goal status: PROGRESSING  3.  Pt will improve her B hip extension, ER, and abduction strength by at least 1/2 MMT grade to promote ability to perform closed chain tasks more comfortably for her back.  Baseline:  MMT Right eval Left eval R (09/12/2023) L  (09/12/2023) R (10/06/2023) L (10/06/2023)  Hip extension (seated manually resisted) 3 3+ 4 4 4+ 4  Hip abduction (seated manually resisted) 4 4 4+ 4+ 5 5  Hip external rotation 4 4- 4 4 4 4    (08/06/2023)   MMT Right (10/16/2023) Left (10/16/2023)  Hip extension (seated manually resisted)    Hip abduction (seated manually resisted)    Hip external rotation 4+      Goal status: MET    4.  Pt will improver her Timed Up and Go time to 12 seconds or less with her [facility] as a demonstration of improved functional mobility  Baseline: 17.8 seconds average with SPC on L side (08/06/2023); 14.9 seconds averange with SPC on L side (09/12/2023); 16.58 seconds on average with SPC on L side (10/06/2023); 13.4 seconds average without the SPC (10/16/2023); 10 seconds average without SPC (11/11/2023) Goal status: MET  5.  Pt will improve her Dynamic Gait Index (DGI) Score to 19/24 or more as a demonstration of improved balance.  Baseline: DGI score: 15/24 (10/06/2023); 17/24 (10/16/2023); 16/24 (11/11/2023) Goal status: IN PROGRESS    PLAN:  PT  FREQUENCY: 2x/week  PT DURATION: other: 5 weeks  PLANNED INTERVENTIONS: 97110-Therapeutic exercises, 97530- Therapeutic activity, [id number]- Neuromuscular re-education, 97535- Self Care, 11914- Manual therapy, [phone]- Gait training, [phone]- Canalith repositioning, Q6578- Electrical stimulation (unattended), [phone]- Ionotophoresis 4mg /ml Dexamethasone , Patient/Family education, Balance  training, Stair training, Dry Needling, Joint mobilization, and Vestibular training.  PLAN FOR NEXT SESSION: Posture, trunk and hip strength, femoral control, functional strengthening, balance, manual techniques, modalities PRN  Thank you for your referral.  Adi Seales, PT, DPT 11/18/2023, 2:49 PM

## 2023-11-26 ENCOUNTER — Telehealth: Payer: Self-pay

## 2023-11-26 NOTE — Telephone Encounter (Signed)
-----   Message from [place] sent at 09/25/2023  5:14 PM EDT ----- Received notification from [place] that Ms Tuft does not qualify for assistance for eliquis.  Reviewed cardiology note. They are prescribing eliquis. Please schedule a f/u with cardiology and can for regular follow up and to discuss if other treatment options are an option.  Thanks.   Dr Glendia ----- Message ----- From: Geronimo Manuelita SAUNDERS, [facility] Sent: 09/25/2023  11:14 AM EDT To: Allena Glendia, MD  See note for full details. Do not think there is any assistance available for her sadly. No open grants. Patient assistance requires 3-4% of income spent on meds before approval.   Both Xarelto  and Eliquis = $47/month through her Medicare which is too costly for her. Alternatives could include warfarin or generic dabigatran which are not as ideal but her CHADSVASC is quite high.  Let me know your thoughts. No Cards f/u scheduled at this time. Happy to follow up with patient once you've reviewed

## 2023-11-26 NOTE — Telephone Encounter (Signed)
 Left message to return call to our office.  Okay to relay message to pt. Please document when pt is spoke to.

## 2023-11-27 ENCOUNTER — Other Ambulatory Visit: Payer: Self-pay | Admitting: Internal Medicine

## 2023-11-27 DIAGNOSIS — Z1231 Encounter for screening mammogram for malignant neoplasm of breast: Secondary | ICD-10-CM

## 2023-11-28 NOTE — Telephone Encounter (Unsigned)
 Copied from CRM [id number]. Topic: General - Other >> Nov 28, 2023  8:52 AM [place] E wrote: Reason for CRM: Patient was returning a call from [place]. Reviewed most recent telephone encounter and relayed that Eliquis information to patient. She verbalized understanding and stated she will follow up with cardiology, no further questions at this time.

## 2023-11-28 NOTE — Telephone Encounter (Signed)
 Noted

## 2023-12-30 ENCOUNTER — Ambulatory Visit
Admission: RE | Admit: 2023-12-30 | Discharge: 2023-12-30 | Disposition: A | Discharge status: Home / Self Care | Source: Ambulatory Visit | Attending: Internal Medicine | Admitting: Internal Medicine

## 2023-12-30 DIAGNOSIS — Z1231 Encounter for screening mammogram for malignant neoplasm of breast: Secondary | ICD-10-CM | POA: Diagnosis not present

## 2024-01-19 ENCOUNTER — Ambulatory Visit: Admitting: Internal Medicine

## 2024-01-21 ENCOUNTER — Ambulatory Visit: Admitting: Internal Medicine

## 2024-01-21 ENCOUNTER — Encounter: Payer: Self-pay | Admitting: Internal Medicine

## 2024-01-21 VITALS — BP 118/70 | HR 74 | Resp 16 | Ht 67.5 in | Wt 137.0 lb

## 2024-01-21 DIAGNOSIS — Z0000 Encounter for general adult medical examination without abnormal findings: Secondary | ICD-10-CM

## 2024-01-21 DIAGNOSIS — M48061 Spinal stenosis, lumbar region without neurogenic claudication: Secondary | ICD-10-CM | POA: Diagnosis not present

## 2024-01-21 DIAGNOSIS — I5030 Unspecified diastolic (congestive) heart failure: Secondary | ICD-10-CM

## 2024-01-21 DIAGNOSIS — D696 Thrombocytopenia, unspecified: Secondary | ICD-10-CM

## 2024-01-21 DIAGNOSIS — I4891 Unspecified atrial fibrillation: Secondary | ICD-10-CM

## 2024-01-21 DIAGNOSIS — E7800 Pure hypercholesterolemia, unspecified: Secondary | ICD-10-CM | POA: Diagnosis not present

## 2024-01-21 DIAGNOSIS — I77810 Thoracic aortic ectasia: Secondary | ICD-10-CM | POA: Diagnosis not present

## 2024-01-21 LAB — BASIC METABOLIC PANEL WITH GFR
BUN: 12 mg/dL (ref 6–23)
CO2: 34 meq/L — ABNORMAL HIGH (ref 19–32)
Calcium: 9.5 mg/dL (ref 8.4–10.5)
Chloride: 103 meq/L (ref 96–112)
Creatinine, Ser: 0.54 mg/dL (ref 0.40–1.20)
GFR: 82.45 mL/min (ref >60.00)
Glucose, Bld: 89 mg/dL (ref 70–99)
Potassium: 4.8 meq/L (ref 3.5–5.1)
Sodium: 142 meq/L (ref 135–145)

## 2024-01-21 LAB — HEPATIC FUNCTION PANEL
ALT: 20 U/L (ref 0–35)
AST: 21 U/L (ref 0–37)
Albumin: 4.1 g/dL (ref 3.5–5.2)
Alkaline Phosphatase: 55 U/L (ref 39–117)
Bilirubin, Direct: 0.1 mg/dL (ref 0.0–0.3)
Total Bilirubin: 0.5 mg/dL (ref 0.2–1.2)
Total Protein: 6.7 g/dL (ref 6.0–8.3)

## 2024-01-21 LAB — CBC WITH DIFFERENTIAL/PLATELET
Basophils Absolute: 0 K/uL (ref 0.0–0.1)
Basophils Relative: 0.7 % (ref 0.0–3.0)
Eosinophils Absolute: 0.1 K/uL (ref 0.0–0.7)
Eosinophils Relative: 1.8 % (ref 0.0–5.0)
HCT: 43.6 % (ref 36.0–46.0)
Hemoglobin: 14.1 g/dL (ref 12.0–15.0)
Lymphocytes Relative: 31.4 % (ref 12.0–46.0)
Lymphs Abs: 1.6 K/uL (ref 0.7–4.0)
MCHC: 32.4 g/dL (ref 30.0–36.0)
MCV: 95 fl (ref 78.0–100.0)
Monocytes Absolute: 0.4 K/uL (ref 0.1–1.0)
Monocytes Relative: 6.9 % (ref 3.0–12.0)
Neutro Abs: 3.1 K/uL (ref 1.4–7.7)
Neutrophils Relative %: 59.2 % (ref 43.0–77.0)
Platelets: 180 K/uL (ref 150.0–400.0)
RBC: 4.58 Mil/uL (ref 3.87–5.11)
RDW: 13.5 % (ref 11.5–15.5)
WBC: 5.2 K/uL (ref 4.0–10.5)

## 2024-01-21 LAB — LIPID PANEL
Cholesterol: 205 mg/dL — ABNORMAL HIGH (ref 0–200)
HDL: 78.8 mg/dL (ref >39.00)
LDL Cholesterol: 108 mg/dL — ABNORMAL HIGH (ref 0–99)
NonHDL: 126.24
Total CHOL/HDL Ratio: 3
Triglycerides: 92 mg/dL (ref 0.0–149.0)
VLDL: 18.4 mg/dL (ref 0.0–40.0)

## 2024-01-21 NOTE — Assessment & Plan Note (Signed)
 Continue adequate blood pressure control.

## 2024-01-21 NOTE — Assessment & Plan Note (Signed)
 Low cholesterol diet and exercise.  She has declined cholesterol medication. Follow lipid panel. Recheck today.

## 2024-01-21 NOTE — Assessment & Plan Note (Signed)
 Physical today 01/21/24.  Colonoscopy 08/2012.  Mammogram 12/30/23 - Birads I.

## 2024-01-21 NOTE — Assessment & Plan Note (Signed)
 Has known spinal stenosis.  Has seen Dr Avanell.  S/p injections. S/p recent PT. Continue home exercise. Discussed today. No further intervention at this time.

## 2024-01-21 NOTE — Assessment & Plan Note (Signed)
Last platelet count wnl.   

## 2024-01-21 NOTE — Progress Notes (Signed)
 Subjective:    Patient ID: Deborah Baxter, female    DOB: June 02, 1934, 89 y.o.   MRN: [id number]  Patient here for No chief complaint on file.   HPI With past history of afib, HFpEF, hypercholesterolemia and hypertension, she comes in today to follow up on these issues as well as for a compete physical exam. She is accompanied by her daughter. History obtained from both of them. F/u with cardiology 05/07/23 - stable. S/p PT - for strengthening, gait and her back. She does think that PT helped her balance some. Still with back pain. Discussed continuing her exercise at home. Breathing stable. No increased sob. She does go up and down stairs to do her laundry. She has roommates now. Discussed having them carry her laundry bag. No abdominal pain. Bowels moving regularly with miralax. Discussed bone density. Will notify me if agreeable.    Past Medical History:  Diagnosis Date   (HFpEF) heart failure with preserved ejection fraction (HCC) 04/24/2021   Pulmonary raised concern - question of pulmonary hypertension secondary to DD.  Question of need for right heart cath   A-fib [facility]) 04/24/2021   Acute heart failure with preserved ejection fraction (HCC)    Ascending aorta dilatation (HCC) 08/01/2021   Atrial fibrillation with rapid ventricular response (HCC) 03/20/2021   Congestion of nasal sinus 10/13/2021   Cough 04/23/2021   Diverticulosis 09/05/2012   Colonoscopy 08/31/12 - redundant colon and diverticulosis in the sigmoid colon, descending colon, transverse colon and hepatic flexure.  Recommend f/u colonoscopy in 10 years.    Elevated bilirubin 09/13/2021   Elevated blood pressure reading 12/05/2020   Health care maintenance 07/13/2014   Physical 07/12/14.  Colonoscopy 08/31/12.  Normal bone density 04/06/10.  Mammogram 05/30/20- Birads I.     Hx of degenerative disc disease    Hypercholesterolemia    Hypertensive urgency 03/20/2021   Ingrown toenail 09/13/2021   Left hand pain 03/20/2021   Low back  pain 01/29/2021   Lumbago 07/13/2014   Lumbar spinal stenosis 06/27/2012   Neuromuscular disorder (HCC)    pinched nerve   Numbness and tingling in left hand 08/21/2017   Osteoarthritis    Pleural effusion 09/13/2021   Pulmonary edema 03/20/2021   Seasonal allergies    Spinal stenosis    chronic back and leg pain   Stress 02/03/2021   Syncope 12/05/2020   Thrombocytopenia (HCC) 09/13/2021   Past Surgical History:  Procedure Laterality Date   APPENDECTOMY     bladder tack surgery  2000   BTL  1976   CARDIOVERSION N/A 05/11/2021   Procedure: CARDIOVERSION;  Surgeon: Perla Evalene PARAS, MD;  Location: [facility] ORS;  Service: Cardiovascular;  Laterality: N/A;   CARPAL TUNNEL RELEASE     CATARACT EXTRACTION W/PHACO Left 12/07/2015   Procedure: CATARACT EXTRACTION PHACO AND INTRAOCULAR LENS PLACEMENT (IOC);  Surgeon: Adine Oneil Novak, MD;  Location: [facility] ORS;  Service: Ophthalmology;  Laterality: Left;  US  1.23 AP% 23.2 CDE 19.28 Fluid Pack Lot # [id number]   EYE SURGERY  03/19/15   GANGLION CYST EXCISION     right hand   GAS/FLUID EXCHANGE Left 03/08/2015   Procedure: GAS/FLUID EXCHANGE;  Surgeon: Harlene Scarce, MD;  Location: [facility] OR[facility];  Service: Ophthalmology;  Laterality: Left;   RIGHT HEART CATH N/A 11/06/2021   Procedure: RIGHT HEART CATH;  Surgeon: Mady Bruckner, MD;  Location: [facility];  Service: Cardiovascular;  Laterality: N/A;   TONSILLECTOMY     TUBAL LIGATION  VITRECTOMY 25 GAUGE WITH SCLERAL BUCKLE Left 03/08/2015   Procedure: VITRECTOMY 25 GAUGE, membrane peel, 26 %  SF6 gas exchange;  Surgeon: Harlene Scarce, MD;  Location: [facility] OR[facility];  Service: Ophthalmology;  Laterality: Left;  casette lot # [id number]   Family History  Problem Relation Age of Onset   Diabetes Mother    Heart disease Father        Unknown age of onset. Unknown if had CHF or heart attack.   Leukemia Father    Breast cancer Sister        in her 8's   Cancer Sister        lung cancer   Thyroid   cancer Sister    Breast cancer Maternal Aunt    Colon cancer Neg Hx    Social History   Socioeconomic History   Marital status: Widowed    Spouse name: Not on file   Number of children: 5   Years of education: Not on file   Highest education level: 12th grade  Occupational History   Not on file  Tobacco Use   Smoking status: Former    Current packs/day: 0.00    Average packs/day: 0.3 packs/day for 10.0 years (2.5 ttl pk-yrs)    Types: Cigarettes    Start date: 50    Quit date: 76    Years since quitting: 52.6   Smokeless tobacco: Never  Vaping Use   Vaping status: Never Used  Substance and Sexual Activity   Alcohol use: Not Currently    Comment: occassional   Drug use: No   Sexual activity: Not on file  Other Topics Concern   Not on file  Social History Narrative   Not on file   Social Drivers of Health   Financial Resource Strain: Low Risk  (01/19/2024)   Overall Financial Resource Strain (CARDIA)    Difficulty of Paying Living Expenses: Not very hard  Food Insecurity: No Food Insecurity (01/19/2024)   Hunger Vital Sign    Worried About Running Out of Food in the Last Year: Never true    Ran Out of Food in the Last Year: Never true  Transportation Needs: No Transportation Needs (01/19/2024)   PRAPARE - Administrator, Civil Service (Medical): No    Lack of Transportation (Non-Medical): No  Physical Activity: Insufficiently Active (01/19/2024)   Exercise Vital Sign    Days of Exercise per Week: 3 days    Minutes of Exercise per Session: 10 min  Stress: No Stress Concern Present (01/19/2024)   [employer] of Occupational Health - Occupational Stress Questionnaire    Feeling of Stress: Only a little  Social Connections: Moderately Integrated (01/19/2024)   Social Connection and Isolation Panel    Frequency of Communication with Friends and Family: More than three times a week    Frequency of Social Gatherings with Friends and Family: More than  three times a week    Attends Religious Services: More than 4 times per year    Active Member of [employer] or Organizations: Yes    Attends Banker Meetings: More than 4 times per year    Marital Status: Widowed     Review of Systems  Constitutional:  Negative for appetite change and unexpected weight change.  HENT:  Negative for congestion, sinus pressure and sore throat.   Eyes:  Negative for pain and visual disturbance.  Respiratory:  Negative for cough and chest tightness.        Breathig stable.  Cardiovascular:  Negative for chest pain, palpitations and leg swelling.  Gastrointestinal:  Negative for abdominal pain, diarrhea, nausea and vomiting.  Genitourinary:  Negative for difficulty urinating and dysuria.  Musculoskeletal:  Positive for back pain. Negative for joint swelling and myalgias.  Skin:  Negative for color change and rash.  Neurological:  Negative for dizziness and headaches.  Hematological:  Negative for adenopathy. Does not bruise/bleed easily.  Psychiatric/Behavioral:  Negative for agitation and dysphoric mood.        Objective:     BP 118/70   Pulse 74   Resp 16   Ht 5' 7.5 (1.715 m)   Wt 137 lb (62.1 kg)   LMP 06/26/1985   SpO2 98%   BMI 21.14 kg/m  Wt Readings from Last 3 Encounters:  01/21/24 137 lb (62.1 kg)  09/16/23 142 lb (64.4 kg)  09/03/23 141 lb 10 oz (64.2 kg)    Physical Exam Vitals reviewed.  Constitutional:      General: She is not in acute distress.    Appearance: Normal appearance. She is well-developed.  HENT:     Head: Normocephalic and atraumatic.     Right Ear: External ear normal.     Left Ear: External ear normal.     Mouth/Throat:     Pharynx: No oropharyngeal exudate or posterior oropharyngeal erythema.  Eyes:     General: No scleral icterus.       Right eye: No discharge.        Left eye: No discharge.     Conjunctiva/sclera: Conjunctivae normal.  Neck:     Thyroid : No thyromegaly.  Cardiovascular:      Rate and Rhythm: Normal rate and regular rhythm.  Pulmonary:     Effort: No tachypnea, accessory muscle usage or respiratory distress.     Breath sounds: Normal breath sounds. No decreased breath sounds or wheezing.  Chest:  Breasts:    Right: No inverted nipple, mass, nipple discharge or tenderness (no axillary adenopathy).     Left: No inverted nipple, mass, nipple discharge or tenderness (no axilarry adenopathy).  Abdominal:     General: Bowel sounds are normal.     Palpations: Abdomen is soft.     Tenderness: There is no abdominal tenderness.  Musculoskeletal:        General: No swelling or tenderness.     Cervical back: Neck supple.  Lymphadenopathy:     Cervical: No cervical adenopathy.  Skin:    Findings: No erythema or rash.  Neurological:     Mental Status: She is alert and oriented to person, place, and time.  Psychiatric:        Mood and Affect: Mood normal.        Behavior: Behavior normal.         Outpatient Encounter Medications as of 01/21/2024  Medication Sig   acetaminophen  (TYLENOL ) 650 MG CR tablet Take 650 mg by mouth every 8 (eight) hours as needed for pain. Reported on 08/02/2015   metoprolol  succinate (TOPROL  XL) 25 MG 24 hr tablet Take 0.5 tablets (12.5 mg total) by mouth daily.   Multiple Vitamin (MULTIVITAMIN) tablet Take 1 tablet by mouth daily.   OVER THE COUNTER MEDICATION Place 1 drop into both eyes every 8 (eight) hours as needed (dry eyes.). Lubricant eye drops   Polyethylene Glycol 3350 (MIRALAX PO) Take 17 g by mouth every 3 (three) days. Every 3 days   rivaroxaban  (XARELTO ) 20 MG TABS tablet TAKE 1 TABLET BY MOUTH ONCE DAILY  WITH SUPPER   Facility-Administered Encounter Medications as of 01/21/2024  Medication   sodium chloride  flush (NS) 0.9 % injection 3 mL     Lab Results  Component Value Date   WBC 5.6 05/16/2023   HGB 13.3 05/16/2023   HCT 40.2 05/16/2023   PLT 209.0 05/16/2023   GLUCOSE 90 09/16/2023   CHOL 214 (H)  09/16/2023   TRIG 106.0 09/16/2023   HDL 75.30 09/16/2023   LDLDIRECT 104.0 08/12/2017   LDLCALC 117 (H) 09/16/2023   ALT 19 09/16/2023   AST 22 09/16/2023   NA 137 09/16/2023   K 4.6 09/16/2023   CL 101 09/16/2023   CREATININE 0.57 09/16/2023   BUN 13 09/16/2023   CO2 32 09/16/2023   TSH 1.86 05/16/2023   INR 1.2 03/20/2021    MM 3D SCREENING MAMMOGRAM BILATERAL BREAST Result Date: 01/01/2024 CLINICAL DATA:  Screening. EXAM: DIGITAL SCREENING BILATERAL MAMMOGRAM WITH TOMOSYNTHESIS AND CAD TECHNIQUE: Bilateral screening digital craniocaudal and mediolateral oblique mammograms were obtained. Bilateral screening digital breast tomosynthesis was performed. The images were evaluated with computer-aided detection. COMPARISON:  Previous exam(s). ACR Breast Density Category b: There are scattered areas of fibroglandular density. FINDINGS: There are no findings suspicious for malignancy. IMPRESSION: No mammographic evidence of malignancy. A result letter of this screening mammogram will be mailed directly to the patient. RECOMMENDATION: Screening mammogram in one year. (Code:SM-B-01Y) BI-RADS CATEGORY  1: Negative. Electronically Signed   By: Toribio Agreste M.D.   On: 01/01/2024 10:39       Assessment & Plan:  Heart failure with preserved ejection fraction, unspecified HF chronicity (HCC) Assessment & Plan: Breathing stable. No increased sob. No evidence of volume overload. Continue metoprolol . Outside weights reviewed. Stable. Continue to weigh daily. Follow metabolic panel. Check today   Orders: -     Basic metabolic panel with GFR  Atrial fibrillation, unspecified type [facility]) Assessment & Plan: Continues on metoprolol  and xarelto .  Appears to be in SR today on exam. Follow.   Orders: -     CBC with Differential/Platelet  Hypercholesterolemia Assessment & Plan: Low cholesterol diet and exercise.  She has declined cholesterol medication. Follow lipid panel. Recheck today.   Orders: -      Hepatic function panel -     Lipid panel -     CBC with Differential/Platelet  Health care maintenance Assessment & Plan: Physical today 01/21/24.  Colonoscopy 08/2012.  Mammogram 12/30/23 - Birads I.    Ascending aorta dilatation (HCC) Assessment & Plan: Continue adequate blood pressure control.    Thrombocytopenia (HCC) Assessment & Plan: Last platelet count wnl.    Spinal stenosis of lumbar region, unspecified whether neurogenic claudication present Assessment & Plan: Has known spinal stenosis.  Has seen Dr Avanell.  S/p injections. S/p recent PT. Continue home exercise. Discussed today. No further intervention at this time.       Allena Hamilton, MD

## 2024-01-21 NOTE — Assessment & Plan Note (Signed)
 Continues on metoprolol  and xarelto .  Appears to be in SR today on exam. Follow.

## 2024-01-21 NOTE — Assessment & Plan Note (Signed)
 Breathing stable. No increased sob. No evidence of volume overload. Continue metoprolol . Outside weights reviewed. Stable. Continue to weigh daily. Follow metabolic panel. Check today

## 2024-01-22 ENCOUNTER — Ambulatory Visit: Payer: Self-pay | Admitting: Internal Medicine

## 2024-02-10 ENCOUNTER — Ambulatory Visit (INDEPENDENT_AMBULATORY_CARE_PROVIDER_SITE_OTHER): Admitting: Podiatry

## 2024-02-10 DIAGNOSIS — B351 Tinea unguium: Secondary | ICD-10-CM

## 2024-02-10 DIAGNOSIS — M79675 Pain in left toe(s): Secondary | ICD-10-CM

## 2024-02-10 DIAGNOSIS — M79674 Pain in right toe(s): Secondary | ICD-10-CM

## 2024-02-10 NOTE — Progress Notes (Signed)
  Subjective:  Patient ID: Deborah Baxter, female    DOB: 05-25-1934,  MRN: [id number]  Chief Complaint  Patient presents with   Nail Problem    Nail trim    89 y.o. female returns for the above complaint.  Patient presents with thickened elongated dystrophic toenails x10.  Mild pain on palpation.  Patient states that she is not able to do it herself.  She would like for me to debride down.  She denies any other acute complaints  Objective:  There were no vitals filed for this visit. Podiatric Exam: Vascular: dorsalis pedis and posterior tibial pulses are palpable bilateral. Capillary return is immediate. Temperature gradient is WNL. Skin turgor WNL  Sensorium: Normal Semmes Weinstein monofilament test. Normal tactile sensation bilaterally. Nail Exam: Pt has thick disfigured discolored nails with subungual debris noted bilateral entire nail hallux through fifth toenails.  Pain on palpation to the nails. Ulcer Exam: There is no evidence of ulcer or pre-ulcerative changes or infection. Orthopedic Exam: Muscle tone and strength are WNL. No limitations in general ROM. No crepitus or effusions noted.  Skin: No Porokeratosis. No infection or ulcers    Assessment & Plan:   1. Pain due to onychomycosis of toenails of both feet          Patient was evaluated and treated and all questions answered.  Onychomycosis with pain  -Nails palliatively debrided as below. -Educated on self-care  Procedure: Nail Debridement Rationale: pain  Type of Debridement: manual, sharp debridement. Instrumentation: Nail nipper, rotary burr. Number of Nails: 10  Procedures and Treatment: Consent by patient was obtained for treatment procedures. The patient understood the discussion of treatment and procedures well. All questions were answered thoroughly reviewed. Debridement of mycotic and hypertrophic toenails, 1 through 5 bilateral and clearing of subungual debris. No ulceration, no infection noted.   Return Visit-Office Procedure: Patient instructed to return to the office for a follow up visit 3 months for continued evaluation and treatment.  Franky Blanch, DPM    No follow-ups on file.

## 2024-02-26 NOTE — Progress Notes (Unsigned)
 Cardiology Office Note    Date:  02/27/2024   ID:  KHARTER SESTAK, DOB [birth year], MRN [id number]  PCP:  Glendia Shad, MD  Cardiologist:  Lonni Hanson, MD  Electrophysiologist:  None   Chief Complaint: Follow up  History of Present Illness:   Deborah Baxter is a 89 y.o. 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   Prior ED evaluation for syncope in 11/2020 with negative orthostatics and largely unrevealing labs.  Cardiac enzymes not cycled.  EKG unavailable for review.   She was admitted to [facility] in 01/2021 with acute HFpEF, A. fib with RVR of uncertain chronicity, and moderate pericardial effusion.  EKG demonstrated A. fib with RVR, 121 bpm, low voltage QRS, and nonspecific ST-T changes.  High-sensitivity troponin of 28 with a delta of 17.  BNP 335.  Echo demonstrated a low normal LV systolic function with an EF of 50 to 55%, no regional wall motion abnormalities, moderate LVH, LV diastolic function parameters could not be evaluated, mildly reduced RV systolic function with normal ventricular cavity size, normal PASP, moderate biatrial enlargement, moderate pericardial effusion that was posterior to the LV without evidence of cardiac tamponade, degenerative mitral valve with moderate regurgitation and moderate to severe mitral annular calcification, moderate tricuspid regurgitation, tricuspid aortic valve with trivial insufficiency and mild aortic valve sclerosis without evidence of stenosis, mildly dilated ascending aorta measuring 41 mm, and an estimated right atrial pressure of 8 mmHg.  Given moderate LVH, biatrial enlargement, and low voltage on EKG, infiltrative cardiomyopathy was  recommended to be considered.  She responded well to IV diuresis.  With regards to her A. fib, she was rate controlled with beta-blocker and initiated on Xarelto .   She was seen in hospital follow-up on 03/30/2021 and was doing reasonably well without tachypalpitations and a noted improvement in her energy.  She still noted some exertional shortness of breath.  She reported adherence to medications and was without symptoms of bleeding.  She remained in A. fib with a ventricular rate of 94 bpm on EKG.  She was felt to be still mildly volume up with recommendation to briefly increase furosemide .  Subsequent repeat limited echo on 05/02/2021 demonstrated an EF of 50%, moderate LVH, normal RV systolic function and ventricular cavity size, stable moderate pericardial effusion that was posterior and lateral to the left ventricle without evidence of cardiac tamponade, moderate pleural effusion in the left lateral region, mild mitral regurgitation, trivial aortic insufficiency, mildly dilated ascending aorta measuring 41 mm, and an estimated right atrial pressure of 8 mmHg.   She was seen in follow-up on 05/04/2021 and reported her breathing was largely stable.  Her weights had trended from 163 to 169 pounds.  She was taking furosemide  40 mg daily.  She remained in rate controlled A. fib and had not missed any doses of anticoagulation.  No bleeding concerns.  Her Lasix  was again briefly titrated.  She underwent successful DCCV without complication on 05/11/2021 following 1 synchronized cardioversion at 150 J with postprocedure EKG demonstrating NSR.   She was seen in 05/2021 and had redeveloped A-fib with controlled ventricular response.  She was asymptomatic regarding this.  Her weight was down 6 pounds by our scale.  Rate control strategy was pursued given age and she was  asymptomatic in A-fib.  With regards to her possible infiltrative cardiomyopathy, patient and family preferred to defer further evaluation of this.   Repeat limited echo on 05/31/2021 demonstrated an EF of 60 to 65%, no regional wall motion abnormalities, severe concentric LVH, indeterminate LV diastolic function parameters, normal RV systolic function and ventricular cavity size, mildly elevated PASP estimated at 38.6 mmHg, moderate biatrial enlargement, moderate pericardial effusion posterior to the LV without evidence of tamponade, mild to moderate mitral regurgitation, moderate mitral annular calcification, moderate tricuspid regurgitation, mild aortic insufficiency, aortic valve sclerosis without evidence of stenosis, mildly dilated ascending aorta measuring 40 mm, and a rhythm of A-fib/flutter.   She was evaluated by her PCP in late 07/2021 with an increase in shortness of breath.  Lasix  was briefly titrated.  Chest x-ray showed chronic left base scarring and possible chronic small pleural effusion without new consolidation.  There was also diffuse chronic interstitial coarsening.  Overall, findings were stable when compared to prior plain film.   She underwent repeat limited echo on 08/17/2021 which showed an EF of 55 to 60%, no regional wall motion abnormalities, severe LVH, indeterminate LV diastolic function parameters, normal RV systolic function and ventricular cavity size, moderately elevated PASP estimated at 50.3 mmHg, moderate biatrial enlargement, mild to moderate mitral regurgitation, moderate tricuspid regurgitation, and estimated right atrial pressure of 15 mmHg, and a stable moderate pericardial effusion without evidence of tamponade.  Given these findings, it was recommended she increase her furosemide  to 40 mg twice daily.   She was seen in the office in 10/2021 and was without symptoms of angina or decompensation.  Her weight was down from 164 pounds to 138 pounds when compared to her visit in 08/2021.  This weight loss was unintentional.  She had spontaneously converted to sinus rhythm with metoprolol  being decreased to 25 mg twice  daily secondary to relative hypotension.   She was evaluated by pulmonology on 11/02/2021 with recommendations for overnight pulse oximetry and to move forward with RHC.   She underwent RHC on 11/06/2021 which demonstrated normal left and right heart filling pressures with a mean RA pressure of 5 mmHg and a PCWP of 15 mmHg.  Borderline pulmonary hypertension with a mean PA pressure of 21 mmHg.  Normal cardiac output and index.      Cardiac MRI on 12/12/2021 demonstrated an EF of 59%, global transmural LGE in a heterogeneous pattern of the LV myocardium, mild circumferential pericardial effusion, moderate asymmetric LVH, and a severely dilated left atrium.  Findings were consistent with an infiltrative cardiomyopathy such as cardiac amyloidosis.   She was last seen in the office in 05/2023 and was doing well from a cardiac perspective.  Patient and family preferred no further aggressive or invasive therapies or further testing regarding possible infiltrative cardiomyopathy.  She comes in accompanied by her daughter today and is doing well from a cardiac perspective, without symptoms of angina or cardiac decompensation.  No dyspnea, lower extremity swelling, or progressive orthopnea.  Continues to be able to perform ADLs and reports a good appetite.  Since she was last seen she did have what sounds like to be a fall approximately 2 weeks ago after using the restroom in the middle of the night she bent over to pick something up and fell forward.  She is unsure if this was a fall versus syncopal episode.  She denied any chest pain, palpitations, or dyspnea surrounding this event.  No loss of bowel or bladder function.  She did  not bite her tongue.  She has been without further episodes since.  Weight largely stable at home.  Her weight is down 5 pounds today at 138 pounds when compared to revisit in 05/2023.  She continues to want to focus on quality and indicates she is ready to pass when it is her time.  No SI or  HI.   Labs independently reviewed: 01/2024 - Hgb 14.1, PLT 180, TC 205, TG 92, HDL 78, LDL 108, albumin 4.1, AST/ALT normal, potassium 4.8, BUN 12, serum creatinine 0.54 05/2023 - TSH normal  Past Medical History:  Diagnosis Date   (HFpEF) heart failure with preserved ejection fraction (HCC) 04/24/2021   Pulmonary raised concern - question of pulmonary hypertension secondary to DD.  Question of need for right heart cath   A-fib [facility]) 04/24/2021   Acute heart failure with preserved ejection fraction (HCC)    Ascending aorta dilatation 08/01/2021   Atrial fibrillation with rapid ventricular response (HCC) 03/20/2021   CHF (congestive heart failure) (HCC)    Congestion of nasal sinus 10/13/2021   Cough 04/23/2021   Diverticulosis 09/05/2012   Colonoscopy 08/31/12 - redundant colon and diverticulosis in the sigmoid colon, descending colon, transverse colon and hepatic flexure.  Recommend f/u colonoscopy in 10 years.    Elevated bilirubin 09/13/2021   Elevated blood pressure reading 12/05/2020   Health care maintenance 07/13/2014   Physical 07/12/14.  Colonoscopy 08/31/12.  Normal bone density 04/06/10.  Mammogram 05/30/20- Birads I.     Hx of degenerative disc disease    Hypercholesterolemia    Hypertensive urgency 03/20/2021   Ingrown toenail 09/13/2021   Left hand pain 03/20/2021   Low back pain 01/29/2021   Lumbago 07/13/2014   Lumbar spinal stenosis 06/27/2012   Neuromuscular disorder (HCC)    pinched nerve   Numbness and tingling in left hand 08/21/2017   Osteoarthritis    Pleural effusion 09/13/2021   Pulmonary edema 03/20/2021   Seasonal allergies    Spinal stenosis    chronic back and leg pain   Stress 02/03/2021   Syncope 12/05/2020   Thrombocytopenia 09/13/2021    Past Surgical History:  Procedure Laterality Date   APPENDECTOMY     bladder tack surgery  2000   BTL  1976   CARDIOVERSION N/A 05/11/2021   Procedure: CARDIOVERSION;  Surgeon: Perla Evalene PARAS, MD;   Location: [facility] ORS;  Service: Cardiovascular;  Laterality: N/A;   CARPAL TUNNEL RELEASE     CATARACT EXTRACTION W/PHACO Left 12/07/2015   Procedure: CATARACT EXTRACTION PHACO AND INTRAOCULAR LENS PLACEMENT (IOC);  Surgeon: Adine Oneil Novak, MD;  Location: [facility] ORS;  Service: Ophthalmology;  Laterality: Left;  US  1.23 AP% 23.2 CDE 19.28 Fluid Pack Lot # [id number]   EYE SURGERY  03/19/15   GANGLION CYST EXCISION     right hand   GAS/FLUID EXCHANGE Left 03/08/2015   Procedure: GAS/FLUID EXCHANGE;  Surgeon: Harlene Scarce, MD;  Location: [facility] ORS;  Service: Ophthalmology;  Laterality: Left;   RIGHT HEART CATH N/A 11/06/2021   Procedure: RIGHT HEART CATH;  Surgeon: Mady Bruckner, MD;  Location: [facility] INVASIVE CV LAB;  Service: Cardiovascular;  Laterality: N/A;   TONSILLECTOMY     TUBAL LIGATION     VITRECTOMY 25 GAUGE WITH SCLERAL BUCKLE Left 03/08/2015   Procedure: VITRECTOMY 25 GAUGE, membrane peel, 26 %  SF6 gas exchange;  Surgeon: Harlene Scarce, MD;  Location: [facility] ORS;  Service: Ophthalmology;  Laterality: Left;  casette lot # [id number]    Current Medications:  Current Meds  Medication Sig   acetaminophen  (TYLENOL ) 650 MG CR tablet Take 650 mg by mouth every 8 (eight) hours as needed for pain. Reported on 08/02/2015   metoprolol  succinate (TOPROL  XL) 25 MG 24 hr tablet Take 0.5 tablets (12.5 mg total) by mouth daily.   Multiple Vitamin (MULTIVITAMIN) tablet Take 1 tablet by mouth daily.   OVER THE COUNTER MEDICATION Place 1 drop into both eyes every 8 (eight) hours as needed (dry eyes.). Lubricant eye drops   Polyethylene Glycol 3350 (MIRALAX PO) Take 17 g by mouth every 3 (three) days. Every 3 days   rivaroxaban  (XARELTO ) 20 MG TABS tablet TAKE 1 TABLET BY MOUTH ONCE DAILY WITH SUPPER   Current Facility-Administered Medications for the 02/27/24 encounter (Office Visit) with Abigail Bernardino HERO, PA-C  Medication   sodium chloride  flush (NS) 0.9 % injection 3 mL    Allergies:   Quinolones    Social History   Socioeconomic History   Marital status: Widowed    Spouse name: Not on file   Number of children: 5   Years of education: Not on file   Highest education level: 12th grade  Occupational History   Not on file  Tobacco Use   Smoking status: Former    Current packs/day: 0.00    Average packs/day: 0.3 packs/day for 10.0 years (2.5 ttl pk-yrs)    Types: Cigarettes    Start date: 55    Quit date: 68    Years since quitting: 52.7   Smokeless tobacco: Never  Vaping Use   Vaping status: Never Used  Substance and Sexual Activity   Alcohol use: Not Currently    Comment: occassional   Drug use: No   Sexual activity: Not on file  Other Topics Concern   Not on file  Social History Narrative   Not on file   Social Drivers of Health   Financial Resource Strain: Low Risk  (01/19/2024)   Overall Financial Resource Strain (CARDIA)    Difficulty of Paying Living Expenses: Not very hard  Food Insecurity: No Food Insecurity (01/19/2024)   Hunger Vital Sign    Worried About Running Out of Food in the Last Year: Never true    Ran Out of Food in the Last Year: Never true  Transportation Needs: No Transportation Needs (01/19/2024)   PRAPARE - Administrator, Civil Service (Medical): No    Lack of Transportation (Non-Medical): No  Physical Activity: Insufficiently Active (01/19/2024)   Exercise Vital Sign    Days of Exercise per Week: 3 days    Minutes of Exercise per Session: 10 min  Stress: No Stress Concern Present (01/19/2024)   [employer] of Occupational Health - Occupational Stress Questionnaire    Feeling of Stress: Only a little  Social Connections: Moderately Integrated (01/19/2024)   Social Connection and Isolation Panel    Frequency of Communication with Friends and Family: More than three times a week    Frequency of Social Gatherings with Friends and Family: More than three times a week    Attends Religious Services: More than 4 times per  year    Active Member of [employer] or Organizations: Yes    Attends Banker Meetings: More than 4 times per year    Marital Status: Widowed     Family History:  The patient's family history includes Breast cancer in her maternal aunt and sister; Cancer in her sister; Diabetes in her mother; Heart disease in her father; Leukemia  in her father; Thyroid  cancer in her sister. There is no history of Colon cancer.  ROS:   12-point review of systems is negative unless otherwise noted in the HPI.   EKGs/Labs/Other Studies Reviewed:    Studies reviewed were summarized above. The additional studies were reviewed today:  2D echo 03/20/2021: 1. Left ventricular ejection fraction, by estimation, is 50 to 55%. The  left ventricle has low normal function. The left ventricle has no regional  wall motion abnormalities. There is moderate left ventricular hypertrophy.  Left ventricular diastolic  function could not be evaluated.   2. Right ventricular systolic function is mildly reduced. The right  ventricular size is normal. There is normal pulmonary artery systolic  pressure.   3. Left atrial size was moderately dilated.   4. Right atrial size was moderately dilated.   5. Moderate pericardial effusion. The pericardial effusion is posterior  to the left ventricle. There is no evidence of cardiac tamponade.   6. The mitral valve is degenerative. Moderate mitral valve regurgitation.  Moderate to severe mitral annular calcification.   7. Tricuspid valve is mildly thickened. The tricuspid valve is abnormal.  Tricuspid valve regurgitation is moderate.   8. The aortic valve is tricuspid. There is mild thickening of the aortic  valve. Aortic valve regurgitation is trivial. Mild aortic valve sclerosis  is present, with no evidence of aortic valve stenosis.   9. There is mild dilatation of the ascending aorta, measuring 41 mm.  10. The inferior vena cava is normal in size with <50% respiratory   variability, suggesting right atrial pressure of 8 mmHg.   Conclusion(s)/Recommendation(s): Given moderate LVH, biatrial enlargement,  and low voltage on EKG, infiltrative cardiomyopathy, including  amyloidosis, should be considered.  __________   Limited echo 05/02/2021: 1. Left ventricular ejection fraction, by estimation, is 50%. There is  moderate left ventricular hypertrophy.   2. Right ventricular systolic function is normal. The right ventricular  size is normal.   3. Moderate pericardial effusion. The pericardial effusion is posterior  and lateral to the left ventricle. There is no evidence of cardiac  tamponade. Moderate pleural effusion in the left lateral region.   4. Mild mitral valve regurgitation.   5. The aortic valve is tricuspid. Aortic valve regurgitation is trivial.   6. Aortic dilatation noted. There is mild dilatation of the ascending  aorta, measuring 41 mm.   7. The inferior vena cava is normal in size with <50% respiratory  variability, suggesting right atrial pressure of 8 mmHg.  __________   Limited echo 05/31/2021: 1. Left ventricular ejection fraction, by estimation, is 60 to 65%. The  left ventricle has normal function. The left ventricle has no regional  wall motion abnormalities. There is severe concentric left ventricular  hypertrophy. Left ventricular diastolic   parameters are indeterminate.   2. Right ventricular systolic function is normal. The right ventricular  size is normal. There is mildly elevated pulmonary artery systolic  pressure. The estimated right ventricular systolic pressure is 38.6 mmHg.   3. Left atrial size was moderately dilated.   4. Right atrial size was moderately dilated.   5. Moderate pericardial effusion in the posterior LV region, estimated  1.69 -2.00 cm, otherwise mild circumferential effusion No tamponade.   6. The mitral valve is normal in structure. Mild to moderate mitral valve  regurgitation. No evidence of  mitral stenosis. Moderate mitral annular  calcification.   7. Tricuspid valve regurgitation is moderate.   8. The aortic valve is  normal in structure. Aortic valve regurgitation is  mild. Aortic valve sclerosis/calcification is present, without any  evidence of aortic stenosis.   9. The inferior vena cava is normal in size with greater than 50%  respiratory variability, suggesting right atrial pressure of 3 mmHg.  10. There is mild dilatation of the ascending aorta, measuring 40 mm.  11. Rhythm apppears to be atrial fib/flutter. __________   Limited echo 08/17/2021: 1. Left ventricular ejection fraction, by estimation, is 55 to 60%. The  left ventricle has normal function. The left ventricle has no regional  wall motion abnormalities. There is severe left ventricular hypertrophy.  Left ventricular diastolic parameters   are indeterminate.   2. Right ventricular systolic function is normal. The right ventricular  size is normal. There is moderately elevated pulmonary artery systolic  pressure. The estimated right ventricular systolic pressure is 50.3 mmHg.   3. Left atrial size was moderately dilated.   4. Right atrial size was moderately dilated.   5. The mitral valve is normal in structure. Mild to moderate mitral valve  regurgitation. No evidence of mitral stenosis.   6. Tricuspid valve regurgitation is moderate.   7. The aortic valve is tricuspid. Aortic valve regurgitation is not  visualized. No aortic stenosis is present.   8. The inferior vena cava is dilated in size with <50% respiratory  variability, suggesting right atrial pressure of 15 mmHg.   9. Moderate pericardial effusion , estimated 1.3 to 2.6 cm off the LV  free wall, mild to moderate off the RV free wall (1.6 cm). No tamponade  noted. __________   RHC 11/06/2021: Conclusions: Normal left and right heart filling pressures (mean RAP 5 mmHg, PCWP 15 mmHg). Borderline pulmonary hypertension (mean PAP 21 mmHg). Normal  Fick cardiac output/index.   Recommendations: Continue medical therapy to maintain net even fluid balance. __________   Cardiac MRI 12/12/2021: IMPRESSION: 1. Normal left and right ventricular function.  LVEF 59%. 2. There is global transmural late gadolinium enhancement (LGE) in a heterogeneous pattern in the left ventricular myocardium. 3. There is mild circumferential pericardial effusion. 4. Moderate asymmetric left ventricular hypertrophy. 5. Severely dilated left atrium. 6. Findings consistent with infiltrative cardiomyopathy such as cardiac amyloidosis.   EKG:  EKG is ordered today.  The EKG ordered today demonstrates NSR, 73 bpm, first-degree AV block, poor R wave progression along the precordial leads, no acute ST-T changes, consistent with prior tracing  Recent Labs: 05/16/2023: TSH 1.86 01/21/2024: ALT 20; BUN 12; Creatinine, Ser 0.54; Hemoglobin 14.1; Platelets 180.0; Potassium 4.8; Sodium 142  Recent Lipid Panel    Component Value Date/Time   CHOL 205 (H) 01/21/2024 1031   TRIG 92.0 01/21/2024 1031   HDL 78.80 01/21/2024 1031   CHOLHDL 3 01/21/2024 1031   VLDL 18.4 01/21/2024 1031   LDLCALC 108 (H) 01/21/2024 1031   LDLDIRECT 104.0 08/12/2017 0932    PHYSICAL EXAM:    VS:  BP 120/70 (BP Location: Left Arm, Patient Position: Sitting, Cuff Size: Normal)   Pulse 73   Wt 138 lb (62.6 kg)   LMP 06/26/1985   SpO2 96%   BMI 21.29 kg/m   BMI: Body mass index is 21.29 kg/m.  Physical Exam Vitals reviewed.  Constitutional:      Appearance: She is well-developed.  HENT:     Head: Normocephalic and atraumatic.  Eyes:     General:        Right eye: No discharge.        Left eye:  No discharge.  Cardiovascular:     Rate and Rhythm: Normal rate and regular rhythm.     Heart sounds: S1 normal and S2 normal. Heart sounds not distant. No midsystolic click and no opening snap. Murmur heard.     Systolic murmur is present with a grade of 1/6 at the upper right sternal  border.     No friction rub.  Pulmonary:     Effort: Pulmonary effort is normal. No respiratory distress.     Breath sounds: Normal breath sounds. No decreased breath sounds, wheezing, rhonchi or rales.  Musculoskeletal:     Cervical back: Normal range of motion.     Right lower leg: No edema.     Left lower leg: No edema.  Skin:    General: Skin is warm and dry.     Nails: There is no clubbing.  Neurological:     Mental Status: She is alert and oriented to person, place, and time.  Psychiatric:        Speech: Speech normal.        Behavior: Behavior normal.        Thought Content: Thought content normal.        Judgment: Judgment normal.     Wt Readings from Last 3 Encounters:  02/27/24 138 lb (62.6 kg)  01/21/24 137 lb (62.1 kg)  09/16/23 142 lb (64.4 kg)     ASSESSMENT & PLAN:   HFpEF with infiltrative cardiomyopathy concerning for cardiac amyloidosis/pulmonary hypertension: Euvolemic and well compensated.  Prior cardiac MRI did demonstrate infiltrative cardiomyopathy concerning for cardiac amyloidosis. This diagnosis has been discussed in detail with patient and daughter with preference to pursue conservative therapy with focusing on quality. Patient and family do not wish to pursue aggressive/invasive therapies or further testing. Given her age, and in the context of her being asymptomatic, this is reasonable. She has declined genetic testing.  No indication for PPM at this time.  RHC showed normal left and right heart filling pressures with borderline pulmonary hypertension.  Not requiring standing loop diuretic.  Defer addition of spironolactone given advanced age and intermittent relative hypotension.  Update echo.  Persistent A-fib: Maintaining sinus rhythm on Toprol -XL 12.5 mg daily.  CHA2DS2-VASc at least 5 (CHF, HTN, age x 2, sex category).  Remains on rivaroxaban  20 mg.  Creatinine clearance 71.  She has had one mechanical fall since she was last seen.  For now, benefit  outweighs risk and will continue OAC.  Recent labs show stable renal function and hemoglobin.  Pericardial effusion: Improved, mild circumferential pericardial effusion noted on cardiac MRI in 12/2021.  No evidence of tamponade physiology on prior imaging.  Hemodynamically stable.  Aortic insufficiency/mitral regurgitation: Echo from 08/2021 showed no evidence of aortic insufficiency with stable mild to moderate mitral regurgitation.  Update echo.  HTN: Blood pressure is well-controlled in the office today.  Remains on Toprol -XL 12.5 mg daily.  Mildly dilated ascending aorta: Most recent echo demonstrated normal size and structure aorta.  Update echo.  Fall: Unclear if this was a mechanical fall versus a syncopal episode.  With underlying infiltrative cardiomyopathy, conduction disease is a concern.  She declines Zio patch.  No evidence of hemodynamic compromise with known history of pericardial effusion.  At this time, she does not want further testing beyond an echo.     Disposition: F/u with Dr. Mady or an APP in 6 months.   Medication Adjustments/Labs and Tests Ordered: Current medicines are reviewed at length with the patient  today.  Concerns regarding medicines are outlined above. Medication changes, Labs and Tests ordered today are summarized above and listed in the Patient Instructions accessible in Encounters.   Signed, Bernardino Bring, PA-C 02/27/2024 5:09 PM     [facility] [place] [address] [place], [place] [address] [phone]

## 2024-02-27 ENCOUNTER — Encounter: Payer: Self-pay | Admitting: Physician Assistant

## 2024-02-27 ENCOUNTER — Ambulatory Visit: Attending: Physician Assistant | Admitting: Physician Assistant

## 2024-02-27 VITALS — BP 120/70 | HR 73 | Wt 138.0 lb

## 2024-02-27 DIAGNOSIS — I4819 Other persistent atrial fibrillation: Secondary | ICD-10-CM | POA: Diagnosis not present

## 2024-02-27 DIAGNOSIS — I3139 Other pericardial effusion (noninflammatory): Secondary | ICD-10-CM | POA: Insufficient documentation

## 2024-02-27 DIAGNOSIS — I428 Other cardiomyopathies: Secondary | ICD-10-CM | POA: Insufficient documentation

## 2024-02-27 DIAGNOSIS — W19XXXA Unspecified fall, initial encounter: Secondary | ICD-10-CM | POA: Diagnosis not present

## 2024-02-27 DIAGNOSIS — I10 Essential (primary) hypertension: Secondary | ICD-10-CM | POA: Diagnosis not present

## 2024-02-27 DIAGNOSIS — I272 Pulmonary hypertension, unspecified: Secondary | ICD-10-CM | POA: Diagnosis not present

## 2024-02-27 DIAGNOSIS — I77810 Thoracic aortic ectasia: Secondary | ICD-10-CM | POA: Insufficient documentation

## 2024-02-27 DIAGNOSIS — I5032 Chronic diastolic (congestive) heart failure: Secondary | ICD-10-CM | POA: Diagnosis not present

## 2024-02-27 DIAGNOSIS — I351 Nonrheumatic aortic (valve) insufficiency: Secondary | ICD-10-CM | POA: Insufficient documentation

## 2024-02-27 DIAGNOSIS — I340 Nonrheumatic mitral (valve) insufficiency: Secondary | ICD-10-CM | POA: Diagnosis not present

## 2024-02-27 NOTE — Patient Instructions (Signed)
 Medication Instructions:  Your physician recommends that you continue on your current medications as directed. Please refer to the Current Medication list given to you today.   *If you need a refill on your cardiac medications before your next appointment, please call your pharmacy*  Lab Work: None ordered at this time   Testing/Procedures: Your physician has requested that you have an echocardiogram. Echocardiography is a painless test that uses sound waves to create images of your heart. It provides your doctor with information about the size and shape of your heart and how well your heart's chambers and valves are working.   You may receive an ultrasound enhancing agent through an IV if needed to better visualize your heart during the echo. This procedure takes approximately one hour.  There are no restrictions for this procedure.  This will take place at [address] [facility]) [address], [place] [address]  Please note: We ask at that you not bring children with you during ultrasound (echo/ vascular) testing. Due to room size and safety concerns, children are not allowed in the ultrasound rooms during exams. Our front office staff cannot provide observation of children in our lobby area while testing is being conducted. An adult accompanying a patient to their appointment will only be allowed in the ultrasound room at the discretion of the ultrasound technician under special circumstances. We apologize for any inconvenience.   Follow-Up: At [facility], you and your health needs are our priority.  As part of our continuing mission to provide you with exceptional heart care, our providers are all part of one team.  This team includes your primary Cardiologist (physician) and Advanced Practice Providers or APPs (Physician Assistants and Nurse Practitioners) who all work together to provide you with the care you need, when you need it.  Your next appointment:   6  month(s)  Provider:   You may see Lonni Hanson, MD or Bernardino Bring, PA-C

## 2024-02-28 ENCOUNTER — Encounter: Payer: Self-pay | Admitting: Internal Medicine

## 2024-02-28 DIAGNOSIS — I3139 Other pericardial effusion (noninflammatory): Secondary | ICD-10-CM | POA: Insufficient documentation

## 2024-04-20 ENCOUNTER — Ambulatory Visit: Attending: Physician Assistant

## 2024-04-20 DIAGNOSIS — I3139 Other pericardial effusion (noninflammatory): Secondary | ICD-10-CM | POA: Insufficient documentation

## 2024-04-20 LAB — ECHOCARDIOGRAM COMPLETE
AR max vel: 1.3 cm2
AV Area VTI: 1.38 cm2
AV Area mean vel: 1.32 cm2
AV Mean grad: 7 mmHg
AV Peak grad: 13.1 mmHg
Ao pk vel: 1.81 m/s
Area-P 1/2: 3.27 cm2
S' Lateral: 2.56 cm

## 2024-04-21 ENCOUNTER — Ambulatory Visit: Payer: Self-pay | Admitting: Physician Assistant

## 2024-05-03 ENCOUNTER — Other Ambulatory Visit: Payer: Self-pay | Admitting: Physician Assistant

## 2024-05-04 NOTE — Telephone Encounter (Signed)
 Prescription refill request for Xarelto  received.  Indication: a-fib Last office visit:02/27/2024 Weight: 62.6kg Age: 89 Scr: 0.54 (01/21/2024) CrCl: 71  Dose appropriate, refill sent.

## 2024-05-11 ENCOUNTER — Ambulatory Visit: Admitting: Podiatry

## 2024-05-13 ENCOUNTER — Ambulatory Visit: Admitting: Podiatry

## 2024-05-13 DIAGNOSIS — M79675 Pain in left toe(s): Secondary | ICD-10-CM | POA: Diagnosis not present

## 2024-05-13 DIAGNOSIS — B351 Tinea unguium: Secondary | ICD-10-CM

## 2024-05-13 DIAGNOSIS — M79674 Pain in right toe(s): Secondary | ICD-10-CM | POA: Diagnosis not present

## 2024-05-13 NOTE — Progress Notes (Signed)
°  Subjective:  Patient ID: Deborah Baxter, female    DOB: 1933-12-16,  MRN: [id number]  Chief Complaint  Patient presents with   Nail Problem   89 y.o. female returns for the above complaint.  Patient presents with thickened elongated dystrophic toenails x10.  Mild pain on palpation.  Patient states that she is not able to do it herself.  She would like for me to debride down.  She denies any other acute complaints  Objective:  There were no vitals filed for this visit. Podiatric Exam: Vascular: dorsalis pedis and posterior tibial pulses are palpable bilateral. Capillary return is immediate. Temperature gradient is WNL. Skin turgor WNL  Sensorium: Normal Semmes Weinstein monofilament test. Normal tactile sensation bilaterally. Nail Exam: Pt has thick disfigured discolored nails with subungual debris noted bilateral entire nail hallux through fifth toenails.  Pain on palpation to the nails. Ulcer Exam: There is no evidence of ulcer or pre-ulcerative changes or infection. Orthopedic Exam: Muscle tone and strength are WNL. No limitations in general ROM. No crepitus or effusions noted.  Skin: No Porokeratosis. No infection or ulcers    Assessment & Plan:   No diagnosis found.        Patient was evaluated and treated and all questions answered.  Onychomycosis with pain  -Nails palliatively debrided as below. -Educated on self-care  Procedure: Nail Debridement Rationale: pain  Type of Debridement: manual, sharp debridement. Instrumentation: Nail nipper, rotary burr. Number of Nails: 10  Procedures and Treatment: Consent by patient was obtained for treatment procedures. The patient understood the discussion of treatment and procedures well. All questions were answered thoroughly reviewed. Debridement of mycotic and hypertrophic toenails, 1 through 5 bilateral and clearing of subungual debris. No ulceration, no infection noted.  Return Visit-Office Procedure: Patient instructed to  return to the office for a follow up visit 3 months for continued evaluation and treatment.  Franky Blanch, DPM    No follow-ups on file.

## 2024-05-24 ENCOUNTER — Ambulatory Visit: Admitting: Internal Medicine

## 2024-05-24 ENCOUNTER — Encounter: Payer: Self-pay | Admitting: Internal Medicine

## 2024-05-24 VITALS — BP 120/68 | HR 71 | Temp 97.6°F | Ht 67.5 in | Wt 134.8 lb

## 2024-05-24 DIAGNOSIS — D696 Thrombocytopenia, unspecified: Secondary | ICD-10-CM

## 2024-05-24 DIAGNOSIS — M48061 Spinal stenosis, lumbar region without neurogenic claudication: Secondary | ICD-10-CM

## 2024-05-24 DIAGNOSIS — R2681 Unsteadiness on feet: Secondary | ICD-10-CM | POA: Diagnosis not present

## 2024-05-24 DIAGNOSIS — I77810 Thoracic aortic ectasia: Secondary | ICD-10-CM | POA: Diagnosis not present

## 2024-05-24 DIAGNOSIS — I4891 Unspecified atrial fibrillation: Secondary | ICD-10-CM

## 2024-05-24 DIAGNOSIS — E7800 Pure hypercholesterolemia, unspecified: Secondary | ICD-10-CM | POA: Diagnosis not present

## 2024-05-24 DIAGNOSIS — H6122 Impacted cerumen, left ear: Secondary | ICD-10-CM | POA: Diagnosis not present

## 2024-05-24 DIAGNOSIS — M4800 Spinal stenosis, site unspecified: Secondary | ICD-10-CM | POA: Diagnosis not present

## 2024-05-24 DIAGNOSIS — I3139 Other pericardial effusion (noninflammatory): Secondary | ICD-10-CM

## 2024-05-24 DIAGNOSIS — I5030 Unspecified diastolic (congestive) heart failure: Secondary | ICD-10-CM

## 2024-05-24 LAB — LIPID PANEL
Cholesterol: 191 mg/dL (ref 28–200)
HDL: 71.3 mg/dL
LDL Cholesterol: 104 mg/dL — ABNORMAL HIGH (ref 10–99)
NonHDL: 119.44
Total CHOL/HDL Ratio: 3
Triglycerides: 78 mg/dL (ref 10.0–149.0)
VLDL: 15.6 mg/dL (ref 0.0–40.0)

## 2024-05-24 LAB — BASIC METABOLIC PANEL WITH GFR
BUN: 16 mg/dL (ref 6–23)
CO2: 32 meq/L (ref 19–32)
Calcium: 9.3 mg/dL (ref 8.4–10.5)
Chloride: 102 meq/L (ref 96–112)
Creatinine, Ser: 0.59 mg/dL (ref 0.40–1.20)
GFR: 80.51 mL/min
Glucose, Bld: 83 mg/dL (ref 70–99)
Potassium: 4.9 meq/L (ref 3.5–5.1)
Sodium: 139 meq/L (ref 135–145)

## 2024-05-24 LAB — HEPATIC FUNCTION PANEL
ALT: 21 U/L (ref 3–35)
AST: 21 U/L (ref 5–37)
Albumin: 4 g/dL (ref 3.5–5.2)
Alkaline Phosphatase: 56 U/L (ref 39–117)
Bilirubin, Direct: 0.1 mg/dL (ref 0.1–0.3)
Total Bilirubin: 0.6 mg/dL (ref 0.2–1.2)
Total Protein: 6.3 g/dL (ref 6.0–8.3)

## 2024-05-24 LAB — TSH: TSH: 1.24 u[IU]/mL (ref 0.35–5.50)

## 2024-05-24 NOTE — Progress Notes (Signed)
 "  Subjective:    Patient ID: Deborah Baxter, female    DOB: 11/20/1933, 89 y.o.   MRN: [id number]  Patient here for  Chief Complaint  Patient presents with   Medical Management of Chronic Issues    HPI Here for a scheduled follow up - follow up regarding afib, HFpEF, hypercholesterolemia and hypertension. She is accompanied by her daughter. History obtained from both of them. S/p PT - for strengthening, gait and her back. She does think that PT helped her balance some.  discussed continuing exercise at home. Had f/u with cardiology 02/27/24 - f/u regarding HFpEF with infiltrative cardiomyopathy concerning for cardiac amyloidosis/pulmonary hypertension: Euvolemic and well compensated. Prior cardiac MRI did demonstrate infiltrative cardiomyopathy concerning for cardiac amyloidosis. Patient and family do not wish to pursue aggressive/invasive therapies or further testing. She has declined genetic testing.  No indication for PPM at this time.  RHC showed normal left and right heart filling pressures with borderline pulmonary hypertension.  Not requiring standing loop diuretic.  Defer addition of spironolactone given advanced age and intermittent relative hypotension.  Update echo. Persistent A-fib: Maintaining sinus rhythm on Toprol -XL 12.5 mg daily.  CHA2DS2-VASc at least 5 (CHF, HTN, age x 2, sex category).  Remains on rivaroxaban  20 mg.  Creatinine clearance 71. Mildly dilated ascending aorta: Most recent echo demonstrated normal size and structure aorta.  Update echo.ECHO 04/20/24 - stable to improved. Overall feels things are stable. Breathing stable. Denies chest pain. Eating. No abdominal pain or bowel change reported. Does report left ear - itching. Some decreased hearing.    Past Medical History:  Diagnosis Date   (HFpEF) heart failure with preserved ejection fraction (HCC) 04/24/2021   Pulmonary raised concern - question of pulmonary hypertension secondary to DD.  Question of need for right heart  cath   A-fib [facility]) 04/24/2021   Acute heart failure with preserved ejection fraction (HCC)    Ascending aorta dilatation 08/01/2021   Atrial fibrillation with rapid ventricular response (HCC) 03/20/2021   CHF (congestive heart failure) (HCC)    Congestion of nasal sinus 10/13/2021   Cough 04/23/2021   Diverticulosis 09/05/2012   Colonoscopy 08/31/12 - redundant colon and diverticulosis in the sigmoid colon, descending colon, transverse colon and hepatic flexure.  Recommend f/u colonoscopy in 10 years.    Elevated bilirubin 09/13/2021   Elevated blood pressure reading 12/05/2020   Health care maintenance 07/13/2014   Physical 07/12/14.  Colonoscopy 08/31/12.  Normal bone density 04/06/10.  Mammogram 05/30/20- Birads I.     Hx of degenerative disc disease    Hypercholesterolemia    Hypertensive urgency 03/20/2021   Ingrown toenail 09/13/2021   Left hand pain 03/20/2021   Low back pain 01/29/2021   Lumbago 07/13/2014   Lumbar spinal stenosis 06/27/2012   Neuromuscular disorder (HCC)    pinched nerve   Numbness and tingling in left hand 08/21/2017   Osteoarthritis    Pleural effusion 09/13/2021   Pulmonary edema 03/20/2021   Seasonal allergies    Spinal stenosis    chronic back and leg pain   Stress 02/03/2021   Syncope 12/05/2020   Thrombocytopenia 09/13/2021   Past Surgical History:  Procedure Laterality Date   APPENDECTOMY     bladder tack surgery  2000   BTL  1976   CARDIOVERSION N/A 05/11/2021   Procedure: CARDIOVERSION;  Surgeon: Perla Evalene PARAS, MD;  Location: [facility] ORS;  Service: Cardiovascular;  Laterality: N/A;   CARPAL TUNNEL RELEASE     CATARACT EXTRACTION W/PHACO  Left 12/07/2015   Procedure: CATARACT EXTRACTION PHACO AND INTRAOCULAR LENS PLACEMENT (IOC);  Surgeon: Adine Oneil Novak, MD;  Location: [facility] ORS;  Service: Ophthalmology;  Laterality: Left;  US  1.23 AP% 23.2 CDE 19.28 Fluid Pack Lot # [id number]   EYE SURGERY  03/19/15   GANGLION CYST EXCISION     right  hand   GAS/FLUID EXCHANGE Left 03/08/2015   Procedure: GAS/FLUID EXCHANGE;  Surgeon: Harlene Scarce, MD;  Location: [facility] ORS;  Service: Ophthalmology;  Laterality: Left;   RIGHT HEART CATH N/A 11/06/2021   Procedure: RIGHT HEART CATH;  Surgeon: Mady Bruckner, MD;  Location: [facility] INVASIVE CV LAB;  Service: Cardiovascular;  Laterality: N/A;   TONSILLECTOMY     TUBAL LIGATION     VITRECTOMY 25 GAUGE WITH SCLERAL BUCKLE Left 03/08/2015   Procedure: VITRECTOMY 25 GAUGE, membrane peel, 26 %  SF6 gas exchange;  Surgeon: Harlene Scarce, MD;  Location: [facility] ORS;  Service: Ophthalmology;  Laterality: Left;  casette lot # [id number]   Family History  Problem Relation Age of Onset   Diabetes Mother    Heart disease Father        Unknown age of onset. Unknown if had CHF or heart attack.   Leukemia Father    Breast cancer Sister        in her 63's   Cancer Sister        lung cancer   Thyroid  cancer Sister    Breast cancer Maternal Aunt    Colon cancer Neg Hx    Social History   Socioeconomic History   Marital status: Widowed    Spouse name: Not on file   Number of children: 5   Years of education: Not on file   Highest education level: Some college, no degree  Occupational History   Not on file  Tobacco Use   Smoking status: Former    Current packs/day: 0.00    Average packs/day: 0.3 packs/day for 10.0 years (2.5 ttl pk-yrs)    Types: Cigarettes    Start date: 55    Quit date: 60    Years since quitting: 53.0   Smokeless tobacco: Never  Vaping Use   Vaping status: Never Used  Substance and Sexual Activity   Alcohol use: Not Currently    Comment: occassional   Drug use: No   Sexual activity: Not on file  Other Topics Concern   Not on file  Social History Narrative   Not on file   Social Drivers of Health   Tobacco Use: Medium Risk (05/30/2024)   Patient History    Smoking Tobacco Use: Former    Smokeless Tobacco Use: Never    Passive Exposure: Not on file   Financial Resource Strain: Low Risk (05/20/2024)   Overall Financial Resource Strain (CARDIA)    Difficulty of Paying Living Expenses: Not hard at all  Food Insecurity: No Food Insecurity (05/20/2024)   Epic    Worried About Radiation Protection Practitioner of Food in the Last Year: Never true    Ran Out of Food in the Last Year: Never true  Transportation Needs: No Transportation Needs (05/20/2024)   Epic    Lack of Transportation (Medical): No    Lack of Transportation (Non-Medical): No  Physical Activity: Insufficiently Active (05/20/2024)   Exercise Vital Sign    Days of Exercise per Week: 2 days    Minutes of Exercise per Session: 10 min  Stress: No Stress Concern Present (05/20/2024)   [employer] of Occupational Health -  Occupational Stress Questionnaire    Feeling of Stress: Only a little  Social Connections: Moderately Integrated (05/20/2024)   Social Connection and Isolation Panel    Frequency of Communication with Friends and Family: More than three times a week    Frequency of Social Gatherings with Friends and Family: Three times a week    Attends Religious Services: More than 4 times per year    Active Member of Clubs or Organizations: Yes    Attends Banker Meetings: More than 4 times per year    Marital Status: Widowed  Depression (PHQ2-9): Low Risk (01/21/2024)   Depression (PHQ2-9)    PHQ-2 Score: 0  Alcohol Screen: Low Risk (09/03/2023)   Alcohol Screen    Last Alcohol Screening Score (AUDIT): 0  Housing: Low Risk (05/20/2024)   Epic    Unable to Pay for Housing in the Last Year: No    Number of Times Moved in the Last Year: 0    Homeless in the Last Year: No  Utilities: Not At Risk (09/03/2023)   AHC Utilities    Threatened with loss of utilities: No  Health Literacy: Inadequate Health Literacy (09/03/2023)   B1300 Health Literacy    Frequency of need for help with medical instructions: Sometimes     Review of Systems  Constitutional:  Negative for appetite  change and unexpected weight change.  HENT:  Negative for congestion and sinus pressure.        Left ear - some itching. Some decreased hearing.   Respiratory:  Negative for cough and chest tightness.        Breathing stable.   Cardiovascular:  Negative for chest pain and palpitations.       No increased swelling.   Gastrointestinal:  Negative for abdominal pain, diarrhea, nausea and vomiting.  Genitourinary:  Negative for difficulty urinating and dysuria.  Musculoskeletal:  Negative for joint swelling and myalgias.  Skin:  Negative for color change and rash.  Neurological:  Negative for dizziness and headaches.  Psychiatric/Behavioral:  Negative for agitation and dysphoric mood.        Objective:     BP 120/68   Pulse 71   Temp 97.6 F (36.4 C) (Oral)   Ht 5' 7.5 (1.715 m)   Wt 134 lb 12.8 oz (61.1 kg)   LMP 06/26/1985   SpO2 97%   BMI 20.80 kg/m  Wt Readings from Last 3 Encounters:  05/24/24 134 lb 12.8 oz (61.1 kg)  02/27/24 138 lb (62.6 kg)  01/21/24 137 lb (62.1 kg)    Physical Exam Vitals reviewed.  Constitutional:      General: She is not in acute distress.    Appearance: Normal appearance.  HENT:     Head: Normocephalic and atraumatic.     Right Ear: External ear normal.     Left Ear: External ear normal.     Ears:     Comments: Left ear cerumen impaction.     Mouth/Throat:     Pharynx: No oropharyngeal exudate or posterior oropharyngeal erythema.  Eyes:     General: No scleral icterus.       Right eye: No discharge.        Left eye: No discharge.     Conjunctiva/sclera: Conjunctivae normal.  Neck:     Thyroid : No thyromegaly.  Cardiovascular:     Rate and Rhythm: Normal rate and regular rhythm.  Pulmonary:     Effort: No respiratory distress.     Breath sounds:  Normal breath sounds. No wheezing.  Abdominal:     General: Bowel sounds are normal.     Palpations: Abdomen is soft.     Tenderness: There is no abdominal tenderness.  Musculoskeletal:         General: No tenderness.     Cervical back: Neck supple. No tenderness.     Comments: No increased swelling.   Lymphadenopathy:     Cervical: No cervical adenopathy.  Skin:    Findings: No erythema or rash.  Neurological:     Mental Status: She is alert.  Psychiatric:        Mood and Affect: Mood normal.        Behavior: Behavior normal.         Outpatient Encounter Medications as of 05/24/2024  Medication Sig   acetaminophen  (TYLENOL ) 650 MG CR tablet Take 650 mg by mouth every 8 (eight) hours as needed for pain. Reported on 08/02/2015   metoprolol  succinate (TOPROL -XL) 25 MG 24 hr tablet Take 1/2 (one-half) tablet by mouth once daily   Multiple Vitamin (MULTIVITAMIN) tablet Take 1 tablet by mouth daily.   OVER THE COUNTER MEDICATION Place 1 drop into both eyes every 8 (eight) hours as needed (dry eyes.). Lubricant eye drops   Polyethylene Glycol 3350 (MIRALAX PO) Take 17 g by mouth every 3 (three) days. Every 3 days (Patient taking differently: Take 17 g by mouth as needed.)   rivaroxaban  (XARELTO ) 20 MG TABS tablet TAKE 1 TABLET BY MOUTH ONCE DAILY WITH SUPPER   Facility-Administered Encounter Medications as of 05/24/2024  Medication   sodium chloride  flush (NS) 0.9 % injection 3 mL     Lab Results  Component Value Date   WBC 5.2 01/21/2024   HGB 14.1 01/21/2024   HCT 43.6 01/21/2024   PLT 180.0 01/21/2024   GLUCOSE 83 05/24/2024   CHOL 191 05/24/2024   TRIG 78.0 05/24/2024   HDL 71.30 05/24/2024   LDLDIRECT 104.0 08/12/2017   LDLCALC 104 (H) 05/24/2024   ALT 21 05/24/2024   AST 21 05/24/2024   NA 139 05/24/2024   K 4.9 05/24/2024   CL 102 05/24/2024   CREATININE 0.59 05/24/2024   BUN 16 05/24/2024   CO2 32 05/24/2024   TSH 1.24 05/24/2024   INR 1.2 03/20/2021    MM 3D SCREENING MAMMOGRAM BILATERAL BREAST Result Date: 01/01/2024 CLINICAL DATA:  Screening. EXAM: DIGITAL SCREENING BILATERAL MAMMOGRAM WITH TOMOSYNTHESIS AND CAD TECHNIQUE: Bilateral  screening digital craniocaudal and mediolateral oblique mammograms were obtained. Bilateral screening digital breast tomosynthesis was performed. The images were evaluated with computer-aided detection. COMPARISON:  Previous exam(s). ACR Breast Density Category b: There are scattered areas of fibroglandular density. FINDINGS: There are no findings suspicious for malignancy. IMPRESSION: No mammographic evidence of malignancy. A result letter of this screening mammogram will be mailed directly to the patient. RECOMMENDATION: Screening mammogram in one year. (Code:SM-B-01Y) BI-RADS CATEGORY  1: Negative. Electronically Signed   By: Toribio Agreste M.D.   On: 01/01/2024 10:39       Assessment & Plan:  Unsteady gait Assessment & Plan: Improved with PT. Continue exercises at home.    Atrial fibrillation, unspecified type [facility]) Assessment & Plan: Continues on metoprolol  and xarelto .  Appears to be in SR today on exam. Overall stable. Follow.    Hypercholesterolemia Assessment & Plan: Low cholesterol diet and exercise.  She has declined cholesterol medication. Follow lipid panel.   Orders: -     Lipid panel -  Hepatic function panel -     Basic metabolic panel with GFR -     TSH  Thrombocytopenia Assessment & Plan: Cbc 01/21/24 - platelets wnl.    Spinal stenosis, unspecified spinal region Assessment & Plan: Chronic back and leg pain.   Has seen physiatry.  Notify if desires further intervention. Follow.    Pericardial effusion Assessment & Plan: F/u cardiology 02/2024 - Pericardial effusion: Improved, mild circumferential pericardial effusion noted on cardiac MRI in 12/2021.    Spinal stenosis of lumbar region, unspecified whether neurogenic claudication present Assessment & Plan: Has known spinal stenosis.  Has seen Dr Avanell.  S/p injections. S/p recent PT. Continue home exercise. Discussed today. Notify if desires further intervention. Follow.    Ascending aorta  dilatation Assessment & Plan: Continue adequate blood pressure control.    Heart failure with preserved ejection fraction (HFpEF), unspecified HF chronicity (HCC) Assessment & Plan: Breathing stable. No increased sob. No evidence of volume overload. Continue metoprolol .  Continue to weight daily. Outside weight and blood pressures reviewed. Stable. Follow.    Impacted cerumen of left ear Assessment & Plan: Discussed using debrox. Relative is a engineer, civil (consulting). Plans to have her clean ear. If desires f/u here, will call to schedule a nurse visit for ear irrigation. Denies pain.       Allena Hamilton, MD "

## 2024-05-24 NOTE — Patient Instructions (Signed)
 Debrox - 3-4 drops left ear daily. Massage for a few minutes.

## 2024-05-25 ENCOUNTER — Ambulatory Visit: Payer: Self-pay | Admitting: Internal Medicine

## 2024-05-30 ENCOUNTER — Encounter: Payer: Self-pay | Admitting: Internal Medicine

## 2024-05-30 DIAGNOSIS — H6120 Impacted cerumen, unspecified ear: Secondary | ICD-10-CM | POA: Insufficient documentation

## 2024-05-30 NOTE — Assessment & Plan Note (Signed)
 Has known spinal stenosis.  Has seen Dr Avanell.  S/p injections. S/p recent PT. Continue home exercise. Discussed today. Notify if desires further intervention. Follow.

## 2024-05-30 NOTE — Assessment & Plan Note (Signed)
 Cbc 01/21/24 - platelets wnl.

## 2024-05-30 NOTE — Assessment & Plan Note (Signed)
 F/u cardiology 02/2024 - Pericardial effusion: Improved, mild circumferential pericardial effusion noted on cardiac MRI in 12/2021.

## 2024-05-30 NOTE — Assessment & Plan Note (Signed)
 Improved with PT. Continue exercises at home.

## 2024-05-30 NOTE — Assessment & Plan Note (Signed)
 Discussed using debrox. Relative is a engineer, civil (consulting). Plans to have her clean ear. If desires f/u here, will call to schedule a nurse visit for ear irrigation. Denies pain.

## 2024-05-30 NOTE — Assessment & Plan Note (Signed)
 Chronic back and leg pain.   Has seen physiatry.  Notify if desires further intervention. Follow.

## 2024-05-30 NOTE — Assessment & Plan Note (Signed)
 Breathing stable. No increased sob. No evidence of volume overload. Continue metoprolol .  Continue to weight daily. Outside weight and blood pressures reviewed. Stable. Follow.

## 2024-05-30 NOTE — Assessment & Plan Note (Signed)
Low cholesterol diet and exercise.  She has declined cholesterol medication.  Follow lipid panel.

## 2024-05-30 NOTE — Assessment & Plan Note (Signed)
 Continue adequate blood pressure control.

## 2024-05-30 NOTE — Assessment & Plan Note (Signed)
 Continues on metoprolol  and xarelto .  Appears to be in SR today on exam. Overall stable. Follow.

## 2024-05-31 ENCOUNTER — Telehealth: Payer: Self-pay | Admitting: Internal Medicine

## 2024-05-31 NOTE — Telephone Encounter (Signed)
 Please contact Ms Wegener and notify her that we had referred her to dermatology back in the spring. ([facility]). Referral team has been unable to schedule. Is she still agreeable to schedule?

## 2024-06-10 NOTE — Telephone Encounter (Signed)
 Ok Thank you. Closing referral.

## 2024-08-12 ENCOUNTER — Ambulatory Visit: Admitting: Podiatry

## 2024-08-26 ENCOUNTER — Ambulatory Visit: Admitting: Physician Assistant

## 2024-09-08 ENCOUNTER — Ambulatory Visit

## 2024-09-28 ENCOUNTER — Ambulatory Visit: Admitting: Internal Medicine
